# Patient Record
Sex: Female | Born: 1935 | Race: White | Hispanic: No | Smoking: Never smoker
Health system: Southern US, Community
[De-identification: ages and names within clinical notes are randomized; demographics above are authoritative.]

## PROBLEM LIST (undated history)

## (undated) DIAGNOSIS — C4492 Squamous cell carcinoma of skin, unspecified: Secondary | ICD-10-CM

## (undated) DIAGNOSIS — R7989 Other specified abnormal findings of blood chemistry: Secondary | ICD-10-CM

## (undated) DIAGNOSIS — E7800 Pure hypercholesterolemia, unspecified: Secondary | ICD-10-CM

## (undated) DIAGNOSIS — I10 Essential (primary) hypertension: Secondary | ICD-10-CM

## (undated) DIAGNOSIS — R42 Dizziness and giddiness: Secondary | ICD-10-CM

## (undated) DIAGNOSIS — K5790 Diverticulosis of intestine, part unspecified, without perforation or abscess without bleeding: Secondary | ICD-10-CM

## (undated) DIAGNOSIS — J189 Pneumonia, unspecified organism: Secondary | ICD-10-CM

## (undated) DIAGNOSIS — I491 Atrial premature depolarization: Secondary | ICD-10-CM

## (undated) DIAGNOSIS — M1990 Unspecified osteoarthritis, unspecified site: Secondary | ICD-10-CM

## (undated) DIAGNOSIS — C4491 Basal cell carcinoma of skin, unspecified: Secondary | ICD-10-CM

## (undated) HISTORY — DX: Pure hypercholesterolemia, unspecified: E78.00

## (undated) HISTORY — DX: Basal cell carcinoma of skin, unspecified: C44.91

## (undated) HISTORY — PX: OTHER SURGICAL HISTORY: SHX169

## (undated) HISTORY — PX: TONSILLECTOMY: SHX5217

## (undated) HISTORY — DX: Squamous cell carcinoma of skin, unspecified: C44.92

## (undated) HISTORY — DX: Other specified abnormal findings of blood chemistry: R79.89

## (undated) HISTORY — PX: EYE SURGERY: SHX253

## (undated) HISTORY — DX: Diverticulosis of intestine, part unspecified, without perforation or abscess without bleeding: K57.90

---

## 1982-03-30 HISTORY — PX: BREAST BIOPSY: SHX20

## 2001-09-21 ENCOUNTER — Emergency Department (HOSPITAL_COMMUNITY): Admission: EM | Admit: 2001-09-21 | Discharge: 2001-09-21 | Payer: Self-pay | Admitting: Emergency Medicine

## 2001-09-21 ENCOUNTER — Encounter: Payer: Self-pay | Admitting: Emergency Medicine

## 2002-03-30 DIAGNOSIS — R7989 Other specified abnormal findings of blood chemistry: Secondary | ICD-10-CM

## 2002-03-30 HISTORY — DX: Other specified abnormal findings of blood chemistry: R79.89

## 2002-08-15 ENCOUNTER — Other Ambulatory Visit: Admission: RE | Admit: 2002-08-15 | Discharge: 2002-08-15 | Payer: Self-pay | Admitting: Internal Medicine

## 2002-08-24 ENCOUNTER — Encounter: Payer: Self-pay | Admitting: Internal Medicine

## 2002-08-24 ENCOUNTER — Encounter: Admission: RE | Admit: 2002-08-24 | Discharge: 2002-08-24 | Payer: Self-pay | Admitting: Internal Medicine

## 2003-07-29 HISTORY — PX: TOTAL KNEE ARTHROPLASTY: SHX125

## 2003-07-31 ENCOUNTER — Inpatient Hospital Stay (HOSPITAL_COMMUNITY): Admission: RE | Admit: 2003-07-31 | Discharge: 2003-08-05 | Payer: Self-pay | Admitting: Specialist

## 2004-02-05 ENCOUNTER — Emergency Department (HOSPITAL_COMMUNITY): Admission: EM | Admit: 2004-02-05 | Discharge: 2004-02-05 | Payer: Self-pay | Admitting: Emergency Medicine

## 2004-02-22 ENCOUNTER — Encounter: Admission: RE | Admit: 2004-02-22 | Discharge: 2004-02-22 | Payer: Self-pay | Admitting: Family Medicine

## 2004-10-09 ENCOUNTER — Other Ambulatory Visit: Admission: RE | Admit: 2004-10-09 | Discharge: 2004-10-09 | Payer: Self-pay | Admitting: Family Medicine

## 2005-03-19 IMAGING — CR DG KNEE 1-2V*L*
2 series · 2 of 2 positions shown · non-contrast
Comparison: none

CLINICAL DATA: Osteoarthritis of left knee, preop evaluation for knee replacement.
 TWO VIEW CHEST 
 There is mild cardiomegaly and peribronchial thickening.  No focal air space opacities or effusions.  Degenerative changes are noted in the thoracic spine.
 IMPRESSION
 Cardiomegaly, peribronchial thickening.
 LEFT KNEE TWO VIEW
 There are marked degenerative changes in the left knee with joint space narrowing and osteophyte formation, most pronounced in the medial compartment. No joint effusion or acute bony abnormality.
 Advanced osteoarthritic changes left knee.

[view not recorded (1 of 2)]
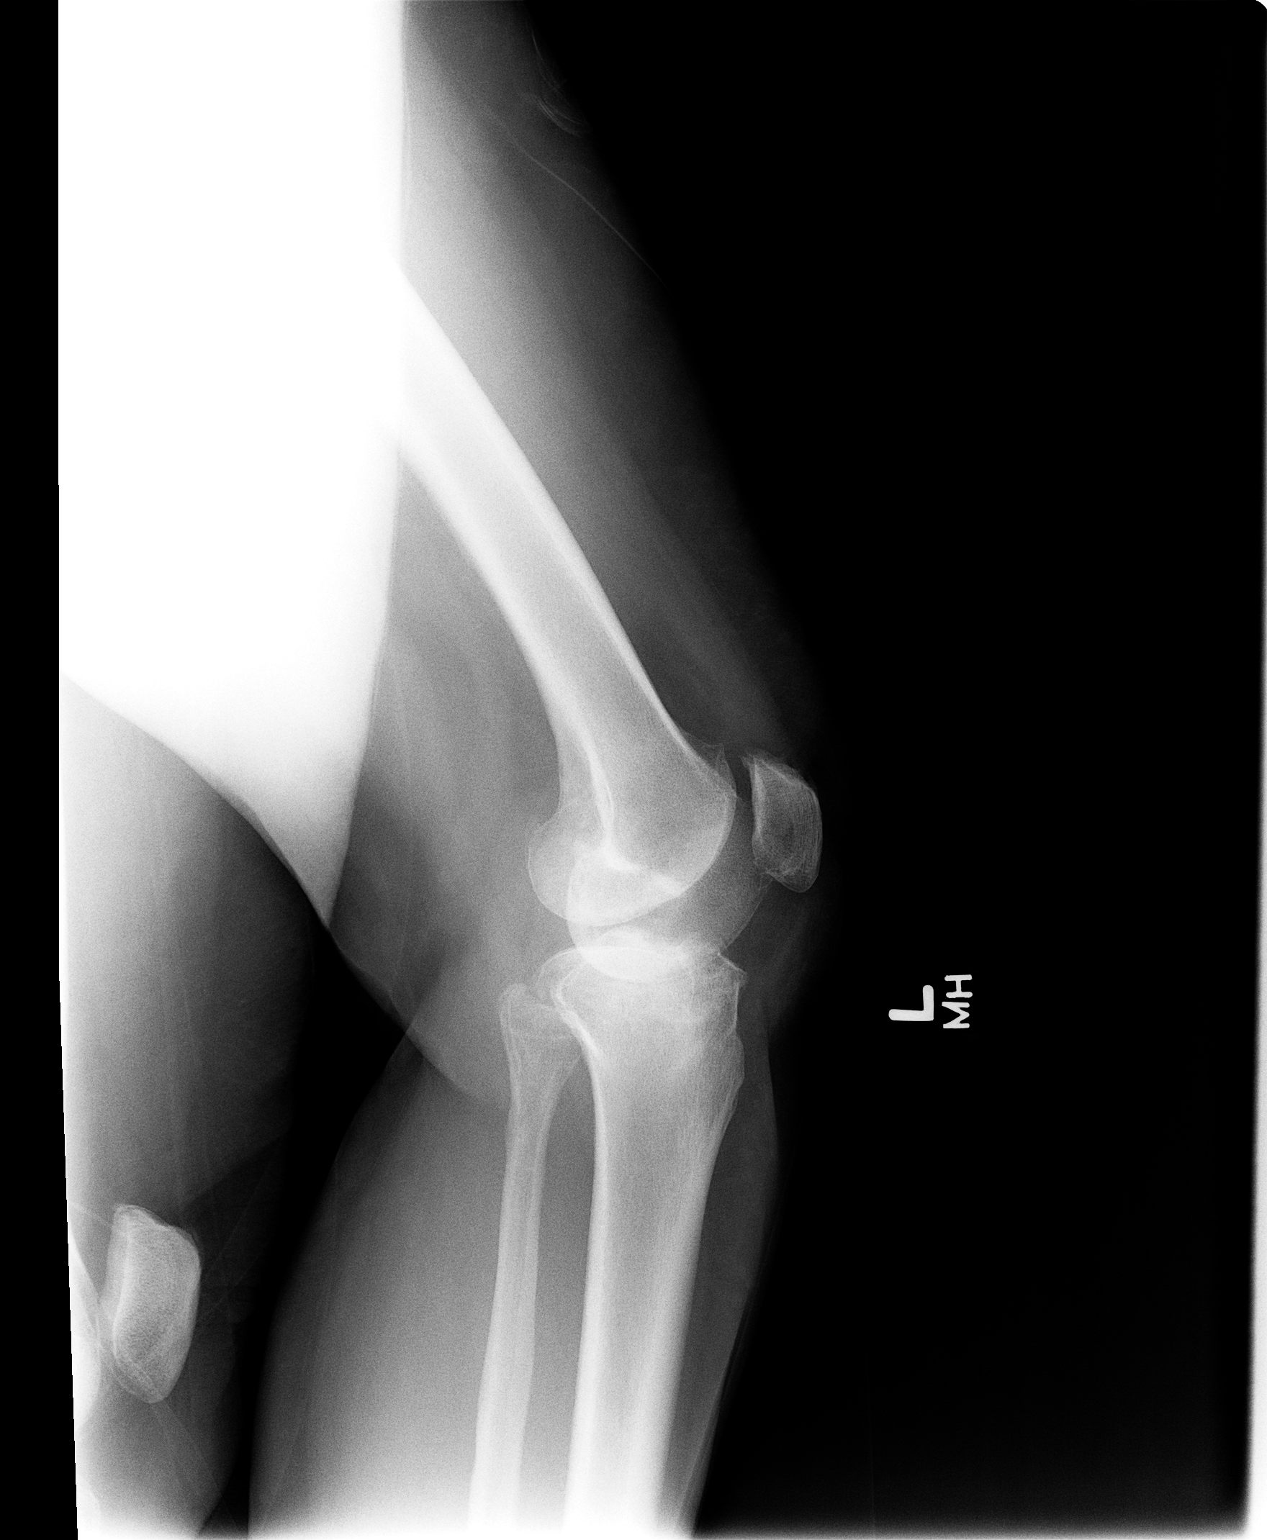

[view not recorded (2 of 2)]
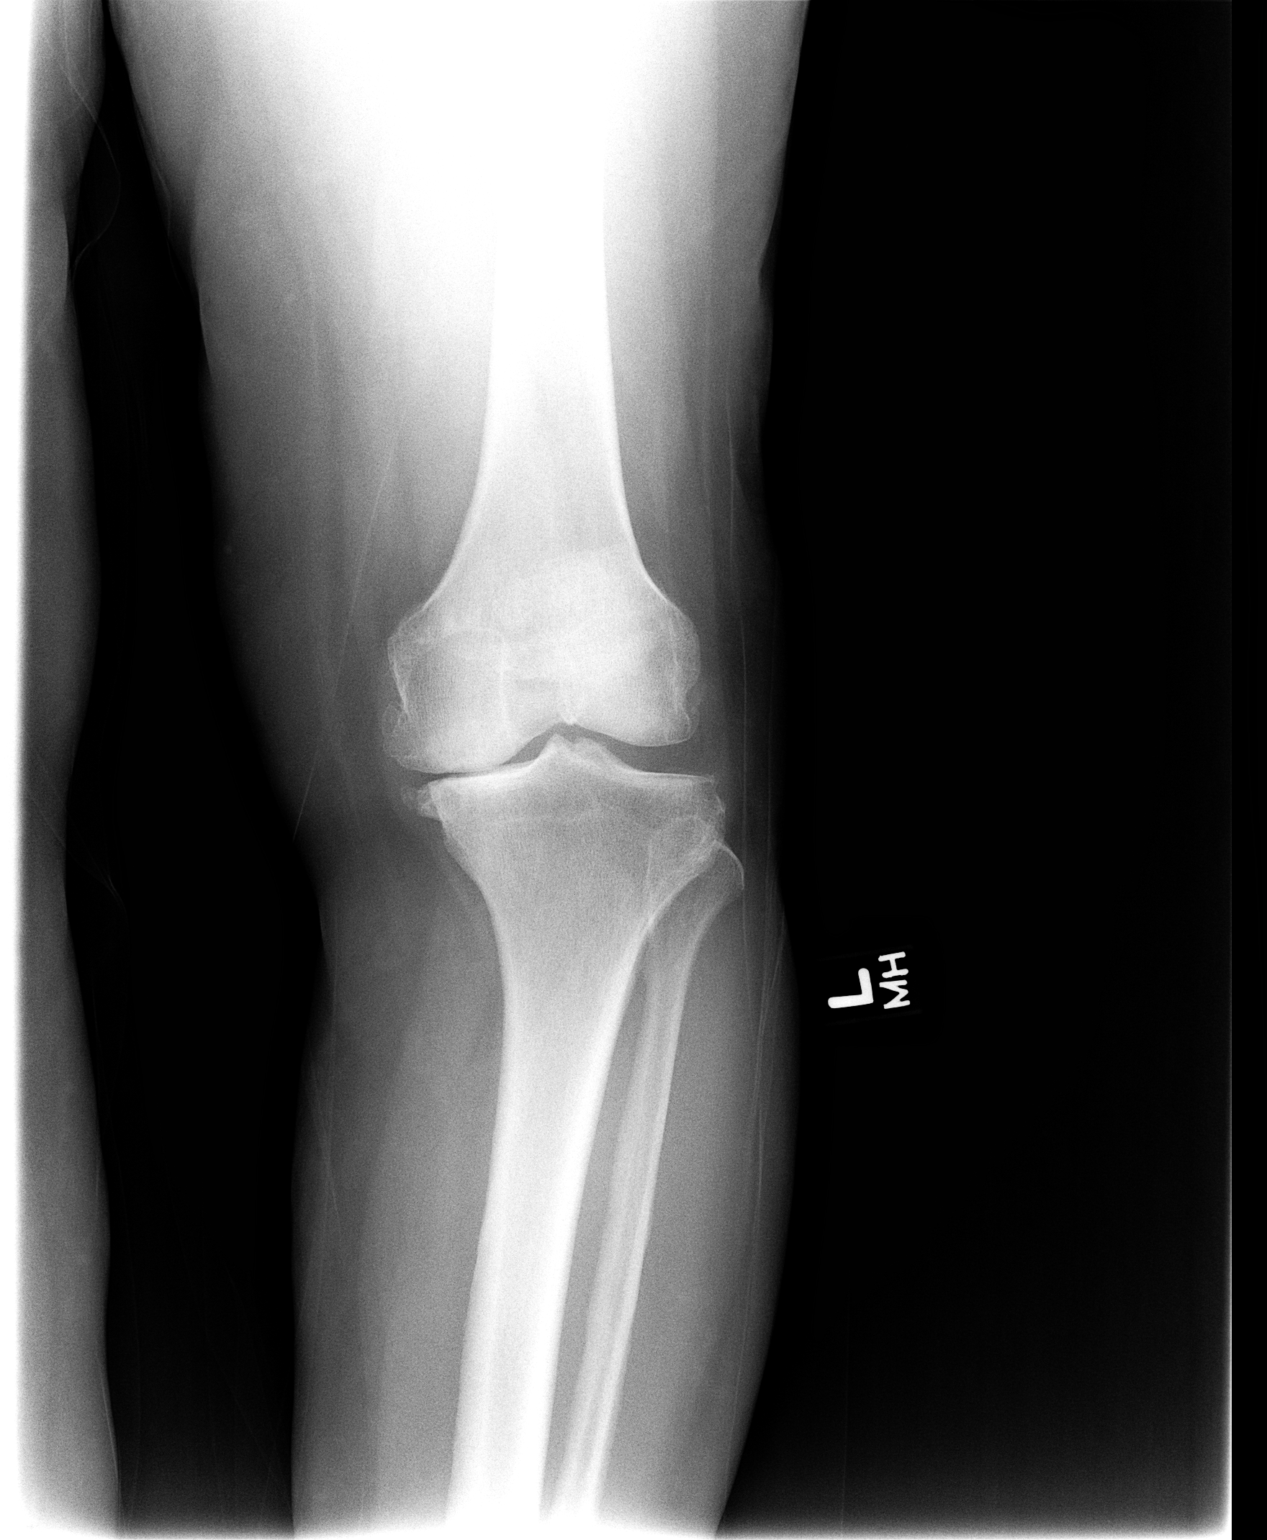

[2 of 2 positions shown; findings below may reference images not displayed]

## 2005-08-20 ENCOUNTER — Encounter: Admission: RE | Admit: 2005-08-20 | Discharge: 2005-08-20 | Payer: Self-pay | Admitting: Family Medicine

## 2005-10-28 DIAGNOSIS — K5790 Diverticulosis of intestine, part unspecified, without perforation or abscess without bleeding: Secondary | ICD-10-CM

## 2005-10-28 HISTORY — DX: Diverticulosis of intestine, part unspecified, without perforation or abscess without bleeding: K57.90

## 2005-11-02 LAB — HM COLONOSCOPY

## 2005-11-18 ENCOUNTER — Emergency Department (HOSPITAL_COMMUNITY): Admission: EM | Admit: 2005-11-18 | Discharge: 2005-11-18 | Payer: Self-pay | Admitting: Emergency Medicine

## 2006-11-24 ENCOUNTER — Encounter: Admission: RE | Admit: 2006-11-24 | Discharge: 2006-11-24 | Payer: Self-pay | Admitting: Family Medicine

## 2006-12-01 ENCOUNTER — Other Ambulatory Visit: Admission: RE | Admit: 2006-12-01 | Discharge: 2006-12-01 | Payer: Self-pay | Admitting: Family Medicine

## 2008-03-07 ENCOUNTER — Encounter: Admission: RE | Admit: 2008-03-07 | Discharge: 2008-03-07 | Payer: Self-pay | Admitting: Family Medicine

## 2009-09-03 ENCOUNTER — Encounter: Admission: RE | Admit: 2009-09-03 | Discharge: 2009-09-03 | Payer: Self-pay | Admitting: Family Medicine

## 2009-11-05 ENCOUNTER — Other Ambulatory Visit: Admission: RE | Admit: 2009-11-05 | Discharge: 2009-11-05 | Payer: Self-pay | Admitting: *Deleted

## 2009-11-12 LAB — HM PAP SMEAR: HM Pap smear: NORMAL

## 2010-08-15 NOTE — Op Note (Signed)
NAME:  Alexis Hines, Alexis Hines                       ACCOUNT NO.:  [id number]   MEDICAL RECORD NO.:  [id number]                   PATIENT TYPE:  INP   LOCATION:  X009                                 FACILITY:  [facility]   PHYSICIAN:  Erasmo Leventhal, M.D.         DATE OF BIRTH:  [birth year]   DATE OF PROCEDURE:  07/31/2003  DATE OF DISCHARGE:                                 OPERATIVE REPORT   PREOPERATIVE DIAGNOSES:  Left knee end-stage osteoarthritis.   POSTOPERATIVE DIAGNOSES:  Left knee end-stage osteoarthritis.   PROCEDURE:  Left total knee arthroplasty.   SURGEON:  Erasmo Leventhal, M.D.   ASSISTANT:  Jaquelyn Bitter. Chabon, P.A.   ANESTHESIA:  Spinal epidural.   ESTIMATED BLOOD LOSS:  Less than 100 mL.   DRAINS:  Hemovac.   TOURNIQUET TIME:  2 hours and 8 minutes at 350 mmHg.   COMPLICATIONS:  None.   DISPOSITION:  To PACU stable.   OPERATIVE IMPLANTS:  [employer] system was utilized.  Triathlon  total knee replacement. Posterior stabilized.  Size 3 femur, size 3 tibia, 9  mm posterior stabilized tibial insert with a 27 x 8 dome patella all  cemented.   DESCRIPTION OF PROCEDURE:  The patient's family was counseled.  The patient  comes to the holding area, correct side was identified.  She had a regular  cough since yesterday.  A preoperative chest x-ray was taken and it was  unchanged beforehand.  She had no fever, chills or sweats and she wished to  proceed.  Taken to the operating room where the spinal epidural was  administered.  Following this, the Foley catheter was placed utilizing  sterile technique by the OR circulating nurse. The left knee was examined,  she had a 7 degree flexion contracture, she could flex to 125 degrees. She  was elevated, prepped with Duraprep and all draped in a sterile fashion.  Exsanguinated with esmarch and tourniquet was inflated to 350 mmHg.   A straight midline incision was made through the skin and subcutaneous  tissue,  small veins electrocoagulated, medial and lateral soft tissue flaps  were developed at the appropriate level.  Medial parapatellar arthrotomy was  performed, medial soft tissue release done across the medial tibia. The  patella was everted, knee was flexed.  End-stage arthritic change, bone  against bone contact.  The cruciate ligaments were resected. A starting hole  made in the distal femur, canal was irrigated, effluent was clear.  An  intramedullary rod was gently placed.  We chose a 5 degree valgus cut and  took a 10 mm cut off the distal femur.  The distal femur was found to be a  size #3, rotational marks were made and the cutting blocks was applied.  At  this time, the size 3 jig was applied, set in the correct medial and lateral  position and utilizing the standard technique, I was instructed by the  representative. The femoral notch  was prepared initially with osteotomes  proximally and then an osteotome going anterior to posterior and then  __________ distal to proximal.  The __________was found to be well prepared  at this point in time.   Attention was directed to the tibia, medial and lateral menisci were  removed. The posteromedial and posterolateral geniculate were coagulated.  The posterior neurovascular structures were thought of and protected  throughout this entire case.  The tibial eminence was resected, starting  hole made in the tibia.  A step reamer was utilized, canal was irrigated,  effluent was clear.  The intramedullary rod was gently placed. We chose to  take 10 mm off a zero degree slope based upon the lateral side and this was  done.  Posteromedial and posterofemoral osteophytes removed meticulously.  At this time with the knee in flexion, the flexion gap block was applied and  we determined we needed to take another 2 mm off the tibia.  At this point  in time that was then done. At this point, we also noticed that there was a  crack on the medial femoral  condyle at the intercondylar notch going  laterally. The lateral periosteum was intact, it was an oblique fracture. At  this point in time, it was anatomically reduced; however, I was concerned  about this displacing.  Therefore utilizing the AO large frag set, a 4.5  screw was placed from distal to proximal retrograde across the fracture site  and the distal end cut at a good angle. We made sure that we stayed in the  femoral canal.  A 44 mm 4.5 cortical screw was then utilized.  It was  counter sunk and taken below the bone cut.  This gave a nice fixation of the  medial femoral condyle region. This was fractured more through the  cancellous bone obliquely towards the edge of the periphery.  At this time,  I felt it was stable enough at this point to proceed on.  We then put in a  femoral trial a bit over sized to make sure it would not be too tight and  then with a size 3 tibia with a 6 mm tibial insert, we had excellent range  of motion and soft tissue balance. Rotational marks were made and the delta  keel was performed in a standard fashion.   The patella was found to be 27 mm.  8 mm was removed, locking holes were  made.  At this time, utilizing pulsatile lavage, the knee was copiously  irrigated.  The cement was properly mixed on the back table.  Utilizing  modern cement technique, all components were cemented in place.  A size 3  tibia, size 3 femur with the 27 x 8 mm patella.  After the cement had cured,  I put bone wax on the exposed bony surfaces. The tibial trial was removed  with a 9 mm insert with excellent range of motion and soft tissue balance  and flexion extension.  I tried an 72 and it was simply too thick.  At this  time, the trial was removed, excess cement was removed, tibial base plate  was thoroughly dry and cleansed and the 9 mm tibial insert was applied and  given secure fixation circumferentially. The knee was then checked, she had excellent range of motion and  showed well balanced full extension. There  were no complications or problems from the medial femoral condyle and  patellofemoral tracking did not require lateral release.  Two medium Hemovac drains were placed.  Bone wax was placed on exposed bony  surfaces, the knee was then packed with sponges and tourniquet was deflated.  We held pressure on the knee for 8 minutes at this point in time.  Following  this, the Ace wrap was removed and the sponges and we had a nice dry  surgical field.   The knee joint was irrigated with antibiotic solution during the closure.  A  sequential closure of layers was done with arthrotomy Vicryl, subcu Vicryl.  Skin closed with subcuticular Monocryl suture.  Steri-Strips were applied,  sterile compressive dressing. She had normal circulation of the foot and  ankle at the end of the case.  She tolerated the procedure well with no  complications. She was then gently awakened and she was taken from the  operating room to PACU in stable condition.  Surgeon was Erasmo Leventhal, M.D., assistant Jaquelyn Bitter. Chabon, P.A.   To decrease surgical time and help throughout this entire surgical  procedure, Mr. Jaquelyn Bitter. Chabon's assistance was needed.   PLAN:  Stabilization in the PACU.  Postoperative x-ray will be obtained and  will be reviewed.                                               Erasmo Leventhal, M.D.    RAC/MEDQ  D:  07/31/2003  T:  07/31/2003  Job:  [id number]   cc:   Erasmo Leventhal, M.D.  [facility]  [address]  [address]  [place]  [place] [address]  Fax: [phone]

## 2010-08-15 NOTE — Discharge Summary (Signed)
NAME:  Alexis Hines, Alexis Hines                       ACCOUNT NO.:  [id number]   MEDICAL RECORD NO.:  [id number]                   PATIENT TYPE:  INP   LOCATION:  0484                                 FACILITY:  [facility]   PHYSICIAN:  Erasmo Leventhal, M.D.         DATE OF BIRTH:  September 24, 1933   DATE OF ADMISSION:  07/31/2003  DATE OF DISCHARGE:  08/05/2003                                 DISCHARGE SUMMARY   ADMITTING DIAGNOSIS:  Left knee osteoarthritis.   DISCHARGE DIAGNOSIS:  Left knee osteoarthritis.   OPERATION:  Left knee total knee arthroplasty.   HISTORY OF PRESENT ILLNESS:  This is a 77 year old lady with a history of  osteoarthritis of the left knee with near bone-on-bone medial compartment  arthritis and significant patellofemoral and lateral compartment arthritis.  She has failed conservative measures of injection and therapy, and after  discussion of treatment options the patient was scheduled for total knee  arthroplasty.  Surgery risks, benefits and aftercare were discussed in  detail with the patient, questions advised and answered.   LABORATORY VALUES:  Admission CBC within normal limits.  Her hemoglobin and  hematocrit reached a low of 11.0 and 31.8, and then rebounded on Aug 04, 2003, at 64 and 36.  Admission PT and PTT within normal limits.  She was at  18 and 1.8 on her PT and INR on discharge.  She was slightly hyponatremic  intermittently through admission and did have mildly elevated glucose at  150, 125, and 115 respectively.  She also had slight hypocalcemia.  Urine  culture showed insignificant growth and urinalysis showed few epithelial  cells.   COURSE IN THE HOSPITAL:  Patient tolerated the operative procedure well.  First postoperative day vital signs were stable, she was afebrile,  neurovascular status was intact to foot, calves were negative, dressing dry,  and drain removed without difficulty.  Her lungs had bilateral rhonchi at  the bases.  She was  encouraged in incentive spirometry and cough and deep  breathe, and IM consult was obtained for lung congestion and cough.  She was  started on Allegra for allergies and postnasal drip.  Second postoperative  day, patient was feeling better but some moderate pain, vital signs were  stable, temp was 99.6, labs were within acceptable limits with the exception  of slight hyponatremia, fluid restriction was initiated.  Dressing was  changed, wound was benign, calves were soft and nontender.  Lungs had light  rhonchi, which was better today.  Third postoperative day she was feeling  better, nausea and vomiting was resolved, vital signs were stable, temp to  101 max, she had had some slight tachycardia but it was 82 at the time of  our visit.  Lungs were clear, bowel sounds were active, heart sounds were  normal, dressing was changed, wound was benign, calves soft and nontender.  KUB was obtained due to her nausea and vomiting from earlier and showed a  nonspecific gas pattern with no obstruction or ileus.  She continued her  incentive spirometry, physical therapy, CPM machine, and plans were made to  discharge home Sunday if she was stable.  Fourth postoperative day vital  signs were stable, PT and INR was therapeutic, hemoglobin was 12, and she  was doing well.  And on the fifth postoperative day in improved condition  with clearance by the medical service, she was subsequently discharged home  for follow up in the office.   CONDITION ON DISCHARGE:  Improved.   DISCHARGE MEDICATIONS:  1. Percocet 5/325 one to two q.4-6h. p.r.n. pain.  2. Robaxin 500 one p.o. q.8h. p.r.n. spasm.  3. Coumadin per pharmacy protocol.  4. Augmentin 500/125 x7 days for low-grade fever.   DISCHARGE INSTRUCTIONS:  She is instructed to elevate and ice the knee, do  her CPM, do her home physical therapy, and return to the office in 10 days  for recheck or soon p.r.n. problems.     Jaquelyn Bitter. Chabon, P.A.                    Erasmo Leventhal, M.D.    SJC/MEDQ  D:  08/20/2003  T:  08/21/2003  Job:  [id number]

## 2010-08-15 NOTE — H&P (Signed)
NAME:  Alexis Hines, Alexis Hines                       ACCOUNT NO.:  [id number]   MEDICAL RECORD NO.:  [id number]                   PATIENT TYPE:  INP   LOCATION:  NA                                   [facility]ACILITY[facility]   PHYSICIAN:  Erasmo Leventhal, M.D.         DATE OF BIRTH:  Jul 28, 1933   DATE OF ADMISSION:  DATE OF DISCHARGE:                                HISTORY & PHYSICAL   DATE OF SURGERY:  Jul 31, 2003   CHIEF COMPLAINT:  Left knee osteoarthritis.   HISTORY OF PRESENT ILLNESS:  This is a 77 year old lady with a history of  osteoarthritis in her left knee.  She has near bone-on-bone medial  compartment arthritis and significant patellofemoral and lateral arthritis.  She has failed conservative measures of injection and therapy and after  discussion of treatment options the patient is now scheduled for total knee  arthroplasty of her left knee.  The surgery risks, benefits, and aftercare  including infection, stiffness, DVT with PE were all discussed in detail  with the patient, questions invited and answered, the surgery to go ahead as  scheduled.   PAST MEDICAL HISTORY:  1. Drug allergies:  None.  2. Current medication:  Vytorin one q.h.s.  3. Previous surgery:  Breast biopsy.  4. Serious medical illnesses:  Hypercholesterolemia.   SOCIAL HISTORY:  The patient is married, she lives at home, she is retired.  She does not smoke and drinks occasionally.   FAMILY HISTORY:  Positive for cancer and osteoporosis.   REVIEW OF SYSTEMS:  CENTRAL NERVOUS SYSTEM:  Positive for occasional  headache, negative for blurred vision or dizziness.  PULMONARY:  Negative  for shortness of breath, PND, or orthopnea.  CARDIOVASCULAR:  Negative for  chest pain or palpitations.  GI:  Negative for ulcers or hepatitis.  GU:  Negative for urinary tract difficulty.  MUSCULOSKELETAL:  Positive as in  HPI.   PHYSICAL EXAMINATION:  VITAL SIGNS:  Blood pressure 148/88, respirations 12,  pulse 78 and  regular.  GENERAL APPEARANCE:  This is a well-developed, well-nourished lady in no  acute distress.  HEENT:  Head normocephalic.  Nose patent, ears patent.  Pupils equal, round,  and reactive to light.  Throat without injection.  NECK:  Supple without adenopathy.  Carotids 2+ without bruit.  CHEST:  Clear to auscultation, no rales or rhonchi.  Respirations 12.  HEART:  Regular rate and rhythm at 78 beats per minute without murmur.  ABDOMEN:  Soft with active bowel sounds, no masses or organomegaly.  NEUROLOGIC:  The patient alert and oriented to time, place, and person.  Cranial nerves II-XII grossly intact.  EXTREMITIES:  The left knee shows a 5-degree flexion contracture with  further flexion to 130 degrees.  Neurovascular status is intact in the leg.  Dorsalis pedis and posterior tibialis pulses are 1+.  The patient is in a  varus deformity; again, has a 5-degree flexion contraction.   X-rays show  near bone-on-bone medial compartment arthritis and significant  patellofemoral and lateral compartment arthritis.   IMPRESSION:  End-stage osteoarthritis left knee.   PLAN:  Total knee arthroplasty left knee.     Jaquelyn Bitter. Chabon, P.A.                   Erasmo Leventhal, M.D.    SJC/MEDQ  D:  07/25/2003  T:  07/25/2003  Job:  [id number]

## 2010-11-26 ENCOUNTER — Ambulatory Visit (INDEPENDENT_AMBULATORY_CARE_PROVIDER_SITE_OTHER): Payer: Medicare Other | Admitting: Family Medicine

## 2010-11-26 ENCOUNTER — Encounter: Payer: Self-pay | Admitting: Family Medicine

## 2010-11-26 VITALS — BP 118/84 | HR 64 | Temp 98.2°F | Ht 59.0 in | Wt 145.0 lb

## 2010-11-26 DIAGNOSIS — E7800 Pure hypercholesterolemia, unspecified: Secondary | ICD-10-CM

## 2010-11-26 DIAGNOSIS — J069 Acute upper respiratory infection, unspecified: Secondary | ICD-10-CM

## 2010-11-26 LAB — LIPID PANEL
Cholesterol: 320 mg/dL — ABNORMAL HIGH (ref 0–200)
LDL Cholesterol: 224 mg/dL — ABNORMAL HIGH (ref 0–99)
Triglycerides: 176 mg/dL — ABNORMAL HIGH (ref <150)
VLDL: 35 mg/dL (ref 0–40)

## 2010-11-26 NOTE — Progress Notes (Signed)
Chief complaints: since Sunday, cough and cold. Patient states she is afraid it might be pneumonia. Also she is scheduled for CPE on 12/11/10 wants to now if she can schedule to have labs done prior to CPE  HPI: complains of 3-4 days with runny nose, head congestion, some eye pain, and cough.  This morning coughed up some clear phlegm. She recalls getting Pneumovax in 2004; 2008 had similar symptoms to what she has now, needed CXR, CT and PET scan to evaluate lung abnormality, and eventually was told she had pneumonia, treated with Levaquin.  Repeat CXR 06/2009 and was clear.  She presents today to evaluate her cold symptoms, and ensure she doesn't have pneumonia.  Hyperlipidemia: She reports that she stopped taking her Crestor 3 months ago.  She denied any side effects or problems, but was concerned after reading some things about her memory possibly being affected by statin medications.  Thinks maybe she is doing the crossword puzzles a little better, but otherwise no significant difference noted since stopping the medication.  Previously took 20mg  of Crestor.  Brings in labs from [facility] from 04/2010--chol 181, LDL 91, HDL 62, TG 133.  Also had normal CBC, C-met, u/a, TSH at that time.  Past Medical History  Diagnosis Date  . Pure hypercholesterolemia   . Basal cell cancer     nose; Dr. Terri Piedra     Past Surgical History  Procedure Date  . Total knee arthroplasty 07/2003    left    History   Social History  . Marital Status: Married    Spouse Name: N/A    Number of Children: 2  . Years of Education: N/A   Occupational History  . Not on file.   Social History Main Topics  . Smoking status: Never Smoker   . Smokeless tobacco: Never Used  . Alcohol Use: Yes     occasional glass of wine.  . Drug Use: No  . Sexually Active: Not on file   Other Topics Concern  . Not on file   Social History Narrative   Married. Lives 1/2 the year in [place]. Her husband (retired Librarian, academic) has liver cancer      Family History  Problem Relation Age of Onset  . Cancer Sister     bone cancer L leg with mets to lung, diagnosed age 63; s/p amputation  . Hyperlipidemia Brother     Current outpatient prescriptions:Calcium Carbonate-Vitamin D (CALTRATE 600+D) 600-400 MG-UNIT per tablet, Take 1 tablet by mouth daily.  , Disp: , Rfl: ;  Multiple Vitamins-Minerals (CENTRUM PO), Take 1 tablet by mouth daily.  , Disp: , Rfl:   No Known Allergies  PHYSICAL EXAM: BP 118/84  Pulse 64  Temp(Src) 98.2 F (36.8 C) (Oral)  Ht 4\' 11"  (1.499 m)  Wt 145 lb (65.772 kg)  BMI 29.29 kg/m2 Well developed, pleasant female, with intermittent dry cough during visit Nasal mucosa moderately edematous, clear mucus Sinuses nontender, OP clear Lungs clear bilaterally, no wheeze or cough with forced expiration Heart: regular rate and rhythm without murmurs Extremities no edema Skin: no rashes Neuro: alert and oriented x 3.  Cranial nerves grossly intact.  Normal gait Psych: normal mood, affect, hygiene and grooming.  ASSESSMENT/PLAN: 1. Acute upper respiratory infections of unspecified site    2. Pure hypercholesterolemia  Lipid panel   URI--add guaifenesin to her current OTC regimen (OTC agents explained in detail) Hyperlipidemia--previously well controlled with Crestor, but pt stopped med 3 months ago.  She is fasting  today and would like lipids re-checked. She states she has ben eating better.  If elevated (as I suspect) recommend restarting the Crestor since she really tolerated it quite well  Check old records to see if Vitamin D ever checked (no records received yet--pt has upcoming CPE)

## 2010-11-26 NOTE — Patient Instructions (Addendum)
Continue with decongestant and cough suppressant.  I think you would benefit from an expectorant to loosen the phlegm (guaifenesin).  You can either add plain Mucinex to your current cold medication (that has phenylephrine and dextromethorphan), OR, you can change to Mucinex-D (this is sold behind the counter at the pharmacy--doesn't need a prescription but requires a signature) and take Delsym syrup (the cough suppressant) only if you need to for cough.  The delsym can be taken along with mucinex D.  No other combinations are acceptable with your current OTC medication or you will be getting duplicate of the ingredients which isn't safe.  If your cholesterol is high on today's labs, then we will be calling you to advise you to restart your Crestor, since you otherwise seem to tolerate it well, and didn't notice any significant improvements (just slight) after stopping the Crestor.

## 2010-11-27 ENCOUNTER — Telehealth: Payer: Self-pay | Admitting: *Deleted

## 2010-11-27 NOTE — Telephone Encounter (Signed)
Called patient to give her lab results, informed her that her cholesterol was 320, she will resume her Crestor and discuss at CPE.

## 2010-12-11 ENCOUNTER — Telehealth: Payer: Self-pay | Admitting: *Deleted

## 2010-12-11 ENCOUNTER — Ambulatory Visit (INDEPENDENT_AMBULATORY_CARE_PROVIDER_SITE_OTHER): Payer: Medicare Other | Admitting: Family Medicine

## 2010-12-11 ENCOUNTER — Encounter: Payer: Self-pay | Admitting: Family Medicine

## 2010-12-11 VITALS — BP 118/80 | HR 72 | Ht 60.0 in | Wt 146.0 lb

## 2010-12-11 DIAGNOSIS — E7800 Pure hypercholesterolemia, unspecified: Secondary | ICD-10-CM

## 2010-12-11 DIAGNOSIS — Z0000 Encounter for general adult medical examination without abnormal findings: Secondary | ICD-10-CM

## 2010-12-11 LAB — POCT URINALYSIS DIPSTICK
Bilirubin, UA: NEGATIVE
Blood, UA: NEGATIVE
Protein, UA: NEGATIVE
Urobilinogen, UA: NEGATIVE

## 2010-12-11 MED ORDER — ROSUVASTATIN CALCIUM 20 MG PO TABS: 20.0000 mg | ORAL_TABLET | Freq: Every day | ORAL | Status: DC

## 2010-12-11 NOTE — Patient Instructions (Addendum)
HEALTH MAINTENANCE RECOMMENDATIONS:  It is recommended that you get at least 30 minutes of aerobic exercise at least 5 days/week (for weight loss, you may need as much as 60-90 minutes). This can be any activity that gets your heart rate up. This can be divided in 10-15 minute intervals if needed, but try and build up your endurance at least once a week.  Weight bearing exercise is also recommended twice weekly.  Eat a healthy diet with lots of vegetables, fruits and fiber.  "Colorful" foods have a lot of vitamins (ie green vegetables, tomatoes, red peppers, etc).  Limit sweet tea, regular sodas and alcoholic beverages, all of which has a lot of calories and sugar.  Up to 1 alcoholic drink daily may be beneficial for women (unless trying to lose weight, watch sugars).  Drink a lot of water.  Calcium recommendations are 1200-1500 mg daily (1500 mg for postmenopausal women or women without ovaries), and vitamin D 1000 IU daily.  This should be obtained from diet and/or supplements (vitamins), and calcium should not be taken all at once, but in divided doses.  Monthly self breast exams and yearly mammograms for women over the age of 68 is recommended.  Sunscreen of at least SPF 30 should be used on all sun-exposed parts of the skin when outside between the hours of 10 am and 4 pm (not just when at beach or pool, but even with exercise, golf, tennis, and yard work!)  Use a sunscreen that says "broad spectrum" so it covers both UVA and UVB rays, and make sure to reapply every 1-2 hours.  Remember to change the batteries in your smoke detectors when changing your clock times in the spring and fall.  Use your seat belt every time you are in a car, and please drive safely and not be distracted with cell phones and texting while driving.   PLEASE SCHEDULE YOUR MAMMOGRAM

## 2010-12-11 NOTE — Telephone Encounter (Signed)
Called patient to let her know that I was putting her hemoccult cards in the mail.

## 2010-12-11 NOTE — Progress Notes (Signed)
Alexis Hines is a 77 y.o. female who presents for a complete physical.  She has the following concerns:  Feeling much better overall from the recent cold.  Just slight residual congestion in throat/chest.  She restarted the Crestor after getting call with her lab results.  Denies any side effects   Immunization History  Administered Date(s) Administered  . Influenza Whole 01/09/2010  . Pneumococcal Polysaccharide 10/29/2002  . Tdap 03/15/2008  . Zoster 03/28/2009  gets flu shots annually--plans to get it next month, declines today Last Pap smear:  10/2009 Last mammogram: spring 2011 Last colonoscopy: 2007 Last DEXA: 09/2008--normal Dentist: 3 times/year Ophtho: yearly, scheduled for next month Exercise: 3x/week Silver Sneakers Had CBC, c-met and TSH (and normal lipids, when was still on her Crestor) in 04/2010 in [place]  Past Medical History  Diagnosis Date  . Pure hypercholesterolemia   . Basal cell cancer     nose; Dr. Terri Piedra   . Abnormal TSH 2004    normal since  . Diverticulosis 8/07  . Migraine childhood    resolved    Past Surgical History  Procedure Date  . Total knee arthroplasty 07/2003    left  . Breast biopsy 1984    benign  . Tonsillectomy age 41    History   Social History  . Marital Status: Married    Spouse Name: N/A    Number of Children: 2  . Years of Education: N/A   Occupational History  . retired Retail buyer    Social History Main Topics  . Smoking status: Never Smoker   . Smokeless tobacco: Never Used  . Alcohol Use: Yes     occasional glass of wine.  . Drug Use: No  . Sexually Active: Not on file   Other Topics Concern  . Not on file   Social History Narrative   Married. Lives 1/2 the year in [place]. Her husband (retired Librarian, academic) has liver cancer    Family History  Problem Relation Age of Onset  . Cancer Sister     bone cancer L leg with mets to lung, diagnosed age 33; s/p amputation  . Hyperlipidemia Brother      Current outpatient prescriptions:Calcium Carbonate-Vitamin D (CALTRATE 600+D) 600-400 MG-UNIT per tablet, Take 1 tablet by mouth daily.  , Disp: , Rfl: ;  Multiple Vitamins-Minerals (CENTRUM PO), Take 1 tablet by mouth daily.  , Disp: , Rfl: ;  rosuvastatin (CRESTOR) 20 MG tablet, Take 1 tablet (20 mg total) by mouth daily., Disp: 28 tablet, Rfl: 0  No Known Allergies  ROS: The patient denies anorexia, fever, weight changes, headaches,  vision changes,  ear pain, sore throat, breast concerns, chest pain, palpitations, dizziness, syncope, dyspnea on exertion, cough, swelling, nausea, vomiting, diarrhea, constipation, abdominal pain, melena, hematochezia, indigestion/heartburn, hematuria, incontinence, dysuria, vaginal bleeding, discharge, odor or itch, genital lesions, joint pains, numbness, tingling, weakness, tremor, suspicious skin lesions, depression, anxiety, abnormal bleeding/bruising, or enlarged lymph nodes. +decreased hearing R ear  PHYSICAL EXAM: BP 118/80  Pulse 72  Ht 5' (1.524 m)  Wt 146 lb (66.225 kg)  BMI 28.51 kg/m2  General Appearance:    Alert, cooperative, no distress, appears stated age  Head:    Normocephalic, without obvious abnormality, atraumatic  Eyes:    PERRL, conjunctiva/corneas clear, EOM's intact, fundi    benign  Ears:    Normal TM's and external ear canals  Nose:   Nares normal, mucosa mildly edematous with clear mucus and slight crusting. No sinus  tenderness  Throat:   Lips, mucosa, and tongue normal; teeth and gums normal  Neck:   Supple, no lymphadenopathy;  thyroid:  no   enlargement/tenderness/nodules; no carotid   bruit or JVD  Back:    Spine nontender, no curvature, ROM normal, no CVA     tenderness  Lungs:     Clear to auscultation bilaterally without wheezes, rales or     ronchi; respirations unlabored  Chest Wall:    No tenderness or deformity   Heart:    Regular rate and rhythm, S1 and S2 normal, no murmur, rub   or gallop  Breast Exam:     No tenderness, masses, or nipple discharge or inversion.      No axillary lymphadenopathy  Abdomen:     Soft, non-tender, nondistended, normoactive bowel sounds,    no masses, no hepatosplenomegaly  Genitalia:    Normal external genitalia without lesions.  BUS and vagina normal; normal bimanual exam--Uterus and adnexa not enlarged, nontender, no masses.  Pap not performed  Rectal:    Normal tone, no masses or tenderness; guaiac negative stool  Extremities:   No clubbing, cyanosis or edema  Pulses:   2+ and symmetric all extremities  Skin:   Skin color, texture, turgor normal, no rashes or lesions  Lymph nodes:   Cervical, supraclavicular, and axillary nodes normal  Neurologic:   CNII-XII intact, normal strength, sensation and gait; reflexes 2+ and symmetric throughout          Psych:   Normal mood, affect, hygiene and grooming.    ASSESSMENT/PLAN: 1. Routine general medical examination at a health care facility  POCT Urinalysis Dipstick, Visual acuity screening  2. Pure hypercholesterolemia  Hepatic function panel, Lipid panel, rosuvastatin (CRESTOR) 20 MG tablet   Discussed monthly self breast exams and yearly mammograms after the age of 19; at least 30 minutes of aerobic activity at least 5 days/week; proper sunscreen use reviewed; healthy diet, including goals of calcium and vitamin D intake and alcohol recommendations (less than or equal to 1 drink/day) reviewed; regular seatbelt use; changing batteries in smoke detectors.  Immunization recommendations discussed--yearly flu shot.  Colonoscopy recommendations reviewed--UTD.  Hemoccult cards (forgot to give at visit, mailing to patient)  F/u in 2 months for fasting labs--to determine if lipids adequately controlled on 20mg  of Crestor.  To be done prior to going to [facility].

## 2010-12-23 ENCOUNTER — Encounter: Payer: Self-pay | Admitting: Family Medicine

## 2010-12-23 ENCOUNTER — Other Ambulatory Visit: Payer: Medicare Other

## 2010-12-23 DIAGNOSIS — R195 Other fecal abnormalities: Secondary | ICD-10-CM | POA: Insufficient documentation

## 2010-12-23 DIAGNOSIS — Z0000 Encounter for general adult medical examination without abnormal findings: Secondary | ICD-10-CM

## 2010-12-23 LAB — POC HEMOCCULT BLD/STL (HOME/3-CARD/SCREEN): Fecal Occult Blood, POC: POSITIVE

## 2010-12-24 ENCOUNTER — Other Ambulatory Visit: Payer: Medicare Other

## 2010-12-25 ENCOUNTER — Telehealth: Payer: Self-pay | Admitting: *Deleted

## 2010-12-25 ENCOUNTER — Other Ambulatory Visit: Payer: Self-pay | Admitting: *Deleted

## 2010-12-25 DIAGNOSIS — K921 Melena: Secondary | ICD-10-CM

## 2010-12-25 NOTE — Telephone Encounter (Signed)
Spoke with patient [id number] stool cards. Patient stated that she did not think she currently had a hemorrhoid but she was constipated several times over the past month. She will call her GI physician and get an appointment. Patient is scheduled for 12/30/10@ 9:00am for cbc check per Dr.Knapp.

## 2010-12-25 NOTE — Telephone Encounter (Signed)
Left message for patient to return my call.

## 2010-12-30 ENCOUNTER — Other Ambulatory Visit: Payer: Medicare Other

## 2010-12-30 DIAGNOSIS — K921 Melena: Secondary | ICD-10-CM

## 2010-12-30 LAB — CBC WITH DIFFERENTIAL/PLATELET
Eosinophils Absolute: 0.2 10*3/uL (ref 0.0–0.7)
Eosinophils Relative: 3 % (ref 0–5)
HCT: 45 % (ref 36.0–46.0)
Lymphocytes Relative: 35 % (ref 12–46)
Lymphs Abs: 2.1 10*3/uL (ref 0.7–4.0)
MCH: 29.9 pg (ref 26.0–34.0)
MCV: 92.8 fL (ref 78.0–100.0)
Neutro Abs: 3.2 10*3/uL (ref 1.7–7.7)
RBC: 4.85 MIL/uL (ref 3.87–5.11)
RDW: 13.3 % (ref 11.5–15.5)
WBC: 6.1 10*3/uL (ref 4.0–10.5)

## 2011-01-12 ENCOUNTER — Other Ambulatory Visit: Payer: Self-pay | Admitting: Family Medicine

## 2011-01-12 DIAGNOSIS — Z1231 Encounter for screening mammogram for malignant neoplasm of breast: Secondary | ICD-10-CM

## 2011-02-04 LAB — HM COLONOSCOPY

## 2011-03-03 ENCOUNTER — Ambulatory Visit
Admission: RE | Admit: 2011-03-03 | Discharge: 2011-03-03 | Disposition: A | Discharge status: Home / Self Care | Payer: Medicare Other | Source: Ambulatory Visit | Attending: Family Medicine | Admitting: Family Medicine

## 2011-03-03 DIAGNOSIS — Z1231 Encounter for screening mammogram for malignant neoplasm of breast: Secondary | ICD-10-CM

## 2011-03-04 ENCOUNTER — Encounter: Payer: Self-pay | Admitting: Internal Medicine

## 2011-03-10 ENCOUNTER — Encounter: Payer: Self-pay | Admitting: Family Medicine

## 2011-03-10 ENCOUNTER — Telehealth: Payer: Self-pay | Admitting: Family Medicine

## 2011-03-10 ENCOUNTER — Other Ambulatory Visit: Payer: Medicare Other

## 2011-03-10 DIAGNOSIS — E7800 Pure hypercholesterolemia, unspecified: Secondary | ICD-10-CM

## 2011-03-10 LAB — LIPID PANEL
LDL Cholesterol: 83 mg/dL (ref 0–99)
Triglycerides: 142 mg/dL (ref <150)

## 2011-03-10 LAB — HEPATIC FUNCTION PANEL
ALT: 19 U/L (ref 0–35)
Albumin: 4.2 g/dL (ref 3.5–5.2)
Bilirubin, Direct: 0.2 mg/dL (ref 0.0–0.3)
Total Protein: 6.2 g/dL (ref 6.0–8.3)

## 2011-03-10 MED ORDER — ROSUVASTATIN CALCIUM 20 MG PO TABS: 20.0000 mg | ORAL_TABLET | Freq: Every day | ORAL | Status: DC

## 2011-03-10 NOTE — Telephone Encounter (Signed)
Pt came in for labs, advised Dr. Lynelle Doctor had told her she could have samples.  Reviewed chart, pt on Crestor 20 mg   1 qd.  Gave #28 samples.

## 2011-03-16 ENCOUNTER — Telehealth: Payer: Self-pay | Admitting: Family Medicine

## 2011-03-16 NOTE — Telephone Encounter (Signed)
NO.  Advise patient that lipids are at goal because the Crestor is working.  If she decreases the dose, then lipids will rise.  If she isn't having any side effects or problems, LEAVE IT at 20mg --it is working perfectly

## 2011-03-16 NOTE — Telephone Encounter (Signed)
Spoke with patient and she will leave Crestor at 20mg .

## 2011-03-16 NOTE — Telephone Encounter (Signed)
Pt said she is doing so well on Crestor 20 mg can she decrease to 10 mg?

## 2011-08-26 ENCOUNTER — Encounter: Payer: Self-pay | Admitting: Family Medicine

## 2011-08-26 ENCOUNTER — Ambulatory Visit (INDEPENDENT_AMBULATORY_CARE_PROVIDER_SITE_OTHER): Payer: Medicare Other | Admitting: Family Medicine

## 2011-08-26 VITALS — BP 118/78 | HR 72 | Temp 98.2°F | Ht 60.0 in | Wt 146.0 lb

## 2011-08-26 DIAGNOSIS — J309 Allergic rhinitis, unspecified: Secondary | ICD-10-CM

## 2011-08-26 NOTE — Progress Notes (Signed)
Chief Complaint  Patient presents with  . Sore Throat    started 08/14/11 but had gotten better. Then she started with runny nose. Just started coughing yesterday, feels just awful.   HPI: 5/17 started with sore throat and sniffles.  Did salt water gargles and used a lozenge and throat got better, but sniffles continued.  Took Mucinex-D twice daily for two days, and it helped some, but sniffles continued.  Yesterday she started with cough, felt weak.  She used Delsym yesterday, with very little help. Denies fevers.  Cough productive of small amount of clear phlegm.  Blowing clear mucus from her nose.  Slight pressure at her cheeks, at sinuses.  Ears are itchy.  Denies itchy/watery eyes.    Past Medical History  Diagnosis Date  . Pure hypercholesterolemia   . Basal cell cancer     nose; Dr. Terri Piedra   . Abnormal TSH 2004    normal since  . Diverticulosis 8/07  . Migraine childhood    resolved   Past Surgical History  Procedure Date  . Total knee arthroplasty 07/2003    left  . Breast biopsy 1984    benign  . Tonsillectomy age 20   History   Social History  . Marital Status: Married    Spouse Name: N/A    Number of Children: 2  . Years of Education: N/A   Occupational History  . retired Retail buyer    Social History Main Topics  . Smoking status: Never Smoker   . Smokeless tobacco: Never Used  . Alcohol Use: Yes     occasional glass of wine.  . Drug Use: No  . Sexually Active: Not on file   Other Topics Concern  . Not on file   Social History Narrative   Married. Lives 1/2 the year in [place]. Her husband (retired Librarian, academic) has liver cancer   Current Outpatient Prescriptions on File Prior to Visit  Medication Sig Dispense Refill  . Calcium Carbonate-Vitamin D (CALTRATE 600+D) 600-400 MG-UNIT per tablet Take 1 tablet by mouth daily.        . Multiple Vitamins-Minerals (CENTRUM PO) Take 1 tablet by mouth daily.        . rosuvastatin (CRESTOR) 20 MG tablet Take 1  tablet (20 mg total) by mouth daily.  28 tablet  0   No Known Allergies  ROS: Denies fevers, nausea, vomiting.  Had slight diarrhea.  No dysuria.  Denies shortness of breath.  Slight chest discomfort after coughing, no chest pain otherwise.   PHYSICAL EXAM: BP 118/78  Pulse 72  Temp(Src) 98.2 F (36.8 C) (Oral)  Ht 5' (1.524 m)  Wt 146 lb (66.225 kg)  BMI 28.51 kg/m2 Well developed, pleasant elderly female with occasional dry cough, in no distress HEENT:  PERRL, EOMI, conjunctiva clear.  Tm's and EAC's normal.  OP clear without erythema Nasal mucosa moderately edematous, pale, no purulence. Sinuses nontender Neck: no lymphadenopathy or mass Heart; regular rate and rhythm, rare ectopic beat, no murmur Lungs: clear bilaterally with good air movement.  No cough with force expiration.  ASSESSMENT/PLAN: 1. Allergic rhinitis, cause unspecified    Allergies.  No evidence of bacterial infection, and history isn't consistent with virus.  Start antihistamine (Claritin, Allegra or Zyrtec) once daily. Restart Mucinex D twice daily Continue Delsym as needed for cough. Try sinus rinses or neti-pot if needed for ongoing sinus pressure.  Call for antibiotics if mucus becomes discolored, worsening sinus pain, despite these measures.

## 2011-08-26 NOTE — Patient Instructions (Signed)
Start antihistamine (Claritin, Allegra or Zyrtec) once daily. Restart Mucinex D twice daily Continue Delsym as needed for cough. Try sinus rinses or neti-pot if needed for ongoing sinus pressure.  Call for antibiotics if mucus becomes discolored, worsening sinus pain, despite these measures.  Drink plenty of fluids

## 2011-09-07 ENCOUNTER — Ambulatory Visit (INDEPENDENT_AMBULATORY_CARE_PROVIDER_SITE_OTHER): Payer: Medicare Other | Admitting: Family Medicine

## 2011-09-07 ENCOUNTER — Encounter: Payer: Self-pay | Admitting: Family Medicine

## 2011-09-07 VITALS — BP 112/68 | HR 64 | Temp 98.3°F | Ht 60.0 in | Wt 148.0 lb

## 2011-09-07 DIAGNOSIS — J309 Allergic rhinitis, unspecified: Secondary | ICD-10-CM

## 2011-09-07 MED ORDER — AMOXICILLIN 875 MG PO TABS: 875.0000 mg | ORAL_TABLET | Freq: Two times a day (BID) | ORAL | Status: AC

## 2011-09-07 MED ORDER — FLUTICASONE PROPIONATE 50 MCG/ACT NA SUSP: 2.0000 | Freq: Every day | NASAL | Status: DC

## 2011-09-07 NOTE — Progress Notes (Signed)
Chief Complaint  Patient presents with  . Nasal Congestion    seen 08/26/11 still having congestion, just not going away. She is flying next week and does not want to be sick. Has only an occasional cough.States that her neck hurts.   HPI:  She has been sick since 5/17.  She was seen in the office 5/29, and was recommended to start antihistamine, Mucinex D and use Delsym as needed for cough. She has been using Allegra daily since the last visit. She used Mucinex-D and Delsym for 5 days with good results--she felt better, but didn't know if she still needed it, so continued just with Allegra.  Has recurrent postnasal drip, head congestion and frontal headaches (which are relieved by Advil).  Nasal mucous is mainly white, sometimes a little yellowish.  Occasional sneezing.  Very little cough--last used Delsym 6/5.    She leaves for [place] next Sunday.  She has been walking a mile 4 times/week in anticipation of doing a lot of walking on hoer trip--walking briskly, and has no increased cough, no shortness of breath or problems  Past Medical History  Diagnosis Date  . Pure hypercholesterolemia   . Basal cell cancer     nose; Dr. Terri Piedra   . Abnormal TSH 2004    normal since  . Diverticulosis 8/07  . Migraine childhood    resolved   Current Outpatient Prescriptions on File Prior to Visit  Medication Sig Dispense Refill  . Calcium Carbonate-Vitamin D (CALTRATE 600+D) 600-400 MG-UNIT per tablet Take 1 tablet by mouth daily.        . fexofenadine (ALLEGRA) 180 MG tablet Take 180 mg by mouth daily.      . Multiple Vitamins-Minerals (CENTRUM PO) Take 1 tablet by mouth daily.        . rosuvastatin (CRESTOR) 20 MG tablet Take 1 tablet (20 mg total) by mouth daily.  28 tablet  0  . dextromethorphan (DELSYM) 30 MG/5ML liquid Take 60 mg by mouth as needed.      . fluticasone (FLONASE) 50 MCG/ACT nasal spray Place 2 sprays into the nose daily.  16 g  6   No Known Allergies  ROS: Denies fevers, nausea,  vomiting, diarrhea, skin rash, joint pains, other than some pain in the back of her neck.  No chest pain, shortness of breath, leg swelling.  PHYSICAL EXAM: BP 112/68  Pulse 64  Temp(Src) 98.3 F (36.8 C) (Oral)  Ht 5' (1.524 m)  Wt 148 lb (67.132 kg)  BMI 28.90 kg/m2 Well developed, pleasant female in no distress. No sniffle or cough. HEENT:  PERRL, conjunctiva clear.   Nasal mucosa moderately edematous Sinuses nontender OP clear Neck: no lymphadenopathy Heart: regular rate and rhythm Lungs: clear  Skin: no rash  ASSESSMENT/PLAN:  1. Allergic rhinitis, cause unspecified  fluticasone (FLONASE) 50 MCG/ACT nasal spray    Continue antihistamine--can try Claritin or Zyrtec to see if it is more effective than Allegra. Add Flonase--2 sprays each nostril daily--make sure to only gently sniff the medication. Use Mucinex-D as needed.  I recommend using this on the day of the plane flight, and as needed for sinus pain, thick mucus and/or cough.  I have prescribed an antibiotic--start it only if your mucus becomes/remains discolored (yellow green), worsening sinus pain, fevers, etc.  Make sure to take the entire course of antibiotics if you need to start it.

## 2011-09-07 NOTE — Patient Instructions (Signed)
Continue antihistamine--can try Claritin or Zyrtec to see if it is more effective than Allegra. Add Flonase--2 sprays each nostril daily--make sure to only gently sniff the medication. Use Mucinex-D as needed.  I recommend using this on the day of the plane flight, and as needed for sinus pain, thick mucus and/or cough.  I have prescribed an antibiotic--start it only if your mucus becomes/remains discolored (yellow green), worsening sinus pain, fevers, etc.  Make sure to take the entire course of antibiotics if you need to start it.

## 2011-10-29 ENCOUNTER — Telehealth: Payer: Self-pay | Admitting: Family Medicine

## 2011-10-29 NOTE — Telephone Encounter (Signed)
She is not a candidate for "pap only" exam.  Her CPE is due in September.  If her insurance covers CPE, it should be scheduled that way.  If it doesn't, then she needs a med check PLUS.  She needs breast/pelvic exam, as well as being due for med check

## 2011-10-29 NOTE — Telephone Encounter (Addendum)
CALLED PT. ADVISED PT OF DR KNAPP'S COMMENTS. ALSO NOTICED THAT PT ALREADY HAS 8/29 APPT SCHED FOR CHOLESTEROL FOLLOW UP. PATIENT STATED SHE WILL KEEP 8/29 APPT AND WAIT ON SCHEDULING CPE AND PAP APPT RIGHT NOW.

## 2011-11-17 ENCOUNTER — Encounter: Payer: Self-pay | Admitting: Internal Medicine

## 2011-11-26 ENCOUNTER — Encounter: Payer: Self-pay | Admitting: Family Medicine

## 2011-11-26 ENCOUNTER — Ambulatory Visit (INDEPENDENT_AMBULATORY_CARE_PROVIDER_SITE_OTHER): Payer: Medicare Other | Admitting: Family Medicine

## 2011-11-26 VITALS — BP 122/76 | HR 64 | Ht 58.25 in | Wt 148.0 lb

## 2011-11-26 DIAGNOSIS — L304 Erythema intertrigo: Secondary | ICD-10-CM

## 2011-11-26 DIAGNOSIS — E7800 Pure hypercholesterolemia, unspecified: Secondary | ICD-10-CM

## 2011-11-26 DIAGNOSIS — L538 Other specified erythematous conditions: Secondary | ICD-10-CM

## 2011-11-26 MED ORDER — ROSUVASTATIN CALCIUM 20 MG PO TABS: 20.0000 mg | ORAL_TABLET | Freq: Every day | ORAL | Status: DC

## 2011-11-26 NOTE — Progress Notes (Signed)
Chief Complaint  Patient presents with  . Follow-up    follow-up on cholesterol   HPI: Hyperlipidemia follow-up:  Patient is reportedly following a low-fat, low cholesterol diet (for the most part).  Compliant with medications and denies medication side effects.  She doesn't need a prescription today, but would like samples, if available.  She reports that her doctor in [place] also does labwork, and believes that she had a more comprehensive panel done there (ie including full chem panel, possibly thyroid)--last checked in March.  She will at some point bring copies (either last years, or after her next set when she goes this winter).  Husband passed away in 05-03-2022 from liver cancer.  Daughter lives nearby.  She keeps very busy, traveling, and is doing well.  She is complaining of itchy rash on her abdomen, left side more than right.Thinks it has been on and off since she gained weight, after menopause.  It seems to be a little better today.  Past Medical History  Diagnosis Date  . Pure hypercholesterolemia   . Basal cell cancer     nose; Dr. Terri Piedra   . Abnormal TSH 2004    normal since  . Diverticulosis 8/07  . Migraine childhood    resolved   Past Surgical History  Procedure Date  . Total knee arthroplasty 07/2003    left  . Breast biopsy 1984    benign  . Tonsillectomy age 37   History   Social History  . Marital Status: Widowed    Spouse Name: N/A    Number of Children: 2  . Years of Education: N/A   Occupational History  . retired Retail buyer    Social History Main Topics  . Smoking status: Never Smoker   . Smokeless tobacco: Never Used  . Alcohol Use: Yes     occasional glass of wine.  . Drug Use: No  . Sexually Active: Not on file   Other Topics Concern  . Not on file   Social History Narrative   Married. Lives 1/2 the year in [place]. Her husband (retired Librarian, academic) died from liver cancer in May 04, 2011.  Daughter lives in [place]   Current Outpatient  Prescriptions on File Prior to Visit  Medication Sig Dispense Refill  . Calcium Carbonate-Vitamin D (CALTRATE 600+D) 600-400 MG-UNIT per tablet Take 1 tablet by mouth daily.        . Multiple Vitamins-Minerals (CENTRUM PO) Take 1 tablet by mouth daily.        . rosuvastatin (CRESTOR) 20 MG tablet Take 1 tablet (20 mg total) by mouth daily.  28 tablet  0  . dextromethorphan (DELSYM) 30 MG/5ML liquid Take 60 mg by mouth as needed.      . fexofenadine (ALLEGRA) 180 MG tablet Take 180 mg by mouth daily.      . fluticasone (FLONASE) 50 MCG/ACT nasal spray Place 2 sprays into the nose daily.  16 g  6   No Known Allergies  ROS: Ears are always itchy. Denies fevers, sinus pain, sore throat, cough, shortness of breath, chest pain, palpitations, myalgias, weakness, headaches, dizziness or other complaints.  See HPI  PHYSICAL EXAM: BP 122/76  Pulse 64  Ht 4' 10.25" (1.48 m)  Wt 148 lb (67.132 kg)  BMI 30.67 kg/m2 Well developed, well-appearing female in no distress HEENT:  TM's and EAC's normal.  OP clear Neck : no lymphadenopathy Heart: regular rate and rhythm without murmur Lungs: clear bilaterally Abdomen: soft, nontender, no organomegaly or mass Skin:  excorations at L lower abdomen.  Area at skin fold of erythema.  No maceration or satellite pustules.  Right side is normal Extremities: no edema, 2+ pulses  ASSESSMENT/PLAN:  1. Pure hypercholesterolemia  Lipid panel, Hepatic function panel, rosuvastatin (CRESTOR) 20 MG tablet  2. Intertrigo     Intertrigo--very mild.  Recommend use of powder, change to cotton underwear.  Use OTC antifungal such as lotrimin twice daily as needed when flares up.  Itchy ears--likely a symptom of allergy.  Restart antihistamine (ie claritin or allegra or zyrtec).  Hyperlipidemia--labs today.  Plan for more comprehensive labs while in [facility], and patient to get copies sent here

## 2011-11-26 NOTE — Patient Instructions (Signed)
Intertrigo (rash on abdomen)--very mild.  Recommend use of powder (cornstarch, or medicated powder such as Gold Bond), change to cotton underwear.  Use OTC antifungal such as lotrimin twice daily as needed when flares up.  Itchy ears--likely a symptom of allergy.  Restart antihistamine (ie claritin or allegra or zyrtec).  Continue your Crestor

## 2011-11-27 ENCOUNTER — Encounter: Payer: Self-pay | Admitting: Family Medicine

## 2011-11-27 LAB — HEPATIC FUNCTION PANEL
Albumin: 4.5 g/dL (ref 3.5–5.2)
Bilirubin, Direct: 0.2 mg/dL (ref 0.0–0.3)
Total Bilirubin: 1 mg/dL (ref 0.3–1.2)

## 2011-11-27 LAB — LIPID PANEL
HDL: 74 mg/dL (ref >39)
LDL Cholesterol: 74 mg/dL (ref 0–99)
Total CHOL/HDL Ratio: 2.3 Ratio
Triglycerides: 98 mg/dL (ref <150)
VLDL: 20 mg/dL (ref 0–40)

## 2012-01-06 ENCOUNTER — Encounter: Payer: Self-pay | Admitting: Family Medicine

## 2012-01-06 ENCOUNTER — Ambulatory Visit (INDEPENDENT_AMBULATORY_CARE_PROVIDER_SITE_OTHER): Payer: Medicare Other | Admitting: Family Medicine

## 2012-01-06 VITALS — BP 112/72 | HR 76 | Temp 98.1°F | Ht 58.75 in | Wt 151.0 lb

## 2012-01-06 DIAGNOSIS — S39012A Strain of muscle, fascia and tendon of lower back, initial encounter: Secondary | ICD-10-CM

## 2012-01-06 DIAGNOSIS — E7800 Pure hypercholesterolemia, unspecified: Secondary | ICD-10-CM

## 2012-01-06 DIAGNOSIS — J309 Allergic rhinitis, unspecified: Secondary | ICD-10-CM

## 2012-01-06 DIAGNOSIS — IMO0002 Reserved for concepts with insufficient information to code with codable children: Secondary | ICD-10-CM

## 2012-01-06 MED ORDER — ROSUVASTATIN CALCIUM 20 MG PO TABS: 20.0000 mg | ORAL_TABLET | Freq: Every day | ORAL | Status: DC

## 2012-01-06 NOTE — Patient Instructions (Signed)
Allergies--restart either Claritin or Allegra, use daily--likely will need to use for at least 1-2 months, possibly chronically.  If your allergies symptoms improve significantly with antihistamine alone, then you don't need to restart Flonase.  If your still have significant allergy symptoms (runny nose, postnasal drainage, sneezing) despite using claritin or allegra daily, then restart using Flonase--remember that Flonase needs to be used every day, 2 gentle sniffs into each nostril, once daily  You may resume mucinex use if the mucus in the throat is very thick, and to help with chest congestion.  You may also use sinus rinses (or Neti-pot) to help with sinus congestion/pain  Muscle strain--try using heat at least 15 minutes 2-3 times/day.  Massage and stretches can also help.  You may continue to use either Aleve or ibuprofen as needed for pain.

## 2012-01-06 NOTE — Progress Notes (Signed)
Chief Complaint  Patient presents with  . Cough    slight cough, nasal congestion and states she has a pain in her back, upper right side. Hurts when she sneezes but does state she has been lifting luggage quite a bit lately. Pt would like to wait on her flu shot until she is feeling better.   Seen in May with congestion, prior to trip to [place].  She never needed the amoxacillin that was prescribed.  She then went to [place] in September, and started with sore throat, cough, and postnasal drip.  She started the Amoxacillin during her trip--she took it for 8 days.  Sore throat resolved.  She was treated with lozenges and cough medication while in [place].  Since returning from [place], her back has been hurting her more.  Pain is on right side, under her shoulder blade. Sometimes is worse with deep breath.  Tried some advil in the last couple of days, without benefit.  Hasn't tried heat. She is worried about pneumonia, but thinks it could be muscular, related to carrying a lot of luggage recently.    Denies fevers, just occasional hot flashes.  Denies nausea, vomiting, diarrhea.  Phlegm is clear in color, sometimes a little yellower in the morning.  Some pressure in her sinuses and chest.  Denies shortness of breath.  Cough is just occasional.  Hasn't taken any antihistamines, mucinex or other OTC meds (except those mentioned above from [place]) since about June.  She is asking for samples of Crestor. She reports having gotten pneumovax while in [place]  Past Medical History  Diagnosis Date  . Pure hypercholesterolemia   . Basal cell cancer     nose; Dr. Terri Piedra   . Abnormal TSH 2004    normal since  . Diverticulosis 8/07  . Migraine childhood    resolved   Past Surgical History  Procedure Date  . Total knee arthroplasty 07/2003    left  . Breast biopsy 1984    benign  . Tonsillectomy age 78   History   Social History  . Marital Status: Widowed    Spouse Name: N/A    Number of Children: 2   . Years of Education: N/A   Occupational History  . retired Retail buyer    Social History Main Topics  . Smoking status: Never Smoker   . Smokeless tobacco: Never Used  . Alcohol Use: Yes     occasional glass of wine.  . Drug Use: No  . Sexually Active: Not on file   Other Topics Concern  . Not on file   Social History Narrative   Married. Lives 1/2 the year in [place]. Her husband (retired Librarian, academic) died from liver cancer in May 16, 2011.  Daughter lives in [place]   Current Outpatient Prescriptions on File Prior to Visit  Medication Sig Dispense Refill  . Calcium Carbonate-Vitamin D (CALTRATE 600+D) 600-400 MG-UNIT per tablet Take 1 tablet by mouth daily.        . Multiple Vitamins-Minerals (CENTRUM PO) Take 1 tablet by mouth daily.        Marland Kitchen DISCONTD: rosuvastatin (CRESTOR) 20 MG tablet Take 1 tablet (20 mg total) by mouth daily.  28 tablet  0  . fexofenadine (ALLEGRA) 180 MG tablet Take 180 mg by mouth daily.      . fluticasone (FLONASE) 50 MCG/ACT nasal spray Place 2 sprays into the nose daily.  16 g  6   No Known Allergies  ROS:  Denies fevers, headaches, dizziness, chest  pain, palpitations, shortness of breath, cough.  Denies nausea, vomiting, bowel changes, skin rash, myalgias.  See HPI  PHYSICAL EXAM: BP 112/72  Pulse 76  Temp 98.1 F (36.7 C) (Oral)  Ht 4' 10.75" (1.492 m)  Wt 151 lb (68.493 kg)  BMI 30.76 kg/m2 Well developed, pleasant elderly female in no distress.  Occasional throat clearing HEENT:  PERRL, conjunctiva clear. TM's and EAC's normal.  OP clear.  Nasal mucosa mildly edematous, no purulence.  Sinuses nontender Neck: no lymphadenopathy, thyromegaly or mass Heart: regular rate and rhythm without murmur Lungs: clear bilaterally. No wheezes, rales, ronchi Back: no spinal tenderness or CVA tenderness.  Tender at right rhomboid muscles, inferiorly, medial to scapula.  Only minimal spasm noted, tender to palpation. Skin: no rash Neuro: alert and  oriented.  Normal gait.  Cranial nerves intact  ASSESSMENT/PLAN:  1. Allergic rhinitis, cause unspecified    2. Back strain    3. Pure hypercholesterolemia  rosuvastatin (CRESTOR) 20 MG tablet   Allergies--restart either Claritin or Allegra, use daily--likely will need to use for at least 1-2 months, possibly chronically.  If your allergies symptoms improve significantly with antihistamine alone, then you don't need to restart Flonase.  If your still have significant allergy symptoms (runny nose, postnasal drainage, sneezing) despite using claritin or allegra daily, then restart using Flonase--remember that Flonase needs to be used every day, 2 gentle sniffs into each nostril, once daily  You may resume mucinex use if the mucus in the throat is very thick, and to help with chest congestion.  You may also use sinus rinses (or Neti-pot) to help with sinus congestion/pain.  Reviewed signs/symptoms of bacterial infection/sinusitis.  Muscle strain--try using heat at least 15 minutes 2-3 times/day.  Massage and stretches can also help.  You may continue to use either Aleve or ibuprofen as needed for pain.  Hyperlipidemia--samples given

## 2012-01-21 ENCOUNTER — Telehealth: Payer: Self-pay | Admitting: Family Medicine

## 2012-01-21 NOTE — Telephone Encounter (Signed)
If she has been having ongoing chest tightness and cough since her visit a few weeks ago, then okay for CXR, and may need to f/u with [place], depending on the results.  If symptoms resolved but just recurred, then CXR likely isn't indicated, unless abnormal findings on exam; I'd prefer to see her and examine her first if symptoms just recently restarted (and not ongoing since last visit).  If there is any question about it, just schedule OV

## 2012-01-22 ENCOUNTER — Ambulatory Visit
Admission: RE | Admit: 2012-01-22 | Discharge: 2012-01-22 | Disposition: A | Discharge status: Home / Self Care | Payer: Medicare Other | Source: Ambulatory Visit | Attending: Family Medicine | Admitting: Family Medicine

## 2012-01-22 ENCOUNTER — Other Ambulatory Visit: Payer: Self-pay | Admitting: *Deleted

## 2012-01-22 ENCOUNTER — Telehealth: Payer: Self-pay | Admitting: *Deleted

## 2012-01-22 DIAGNOSIS — R0789 Other chest pain: Secondary | ICD-10-CM

## 2012-01-22 DIAGNOSIS — R05 Cough: Secondary | ICD-10-CM

## 2012-01-22 DIAGNOSIS — R059 Cough, unspecified: Secondary | ICD-10-CM

## 2012-01-22 NOTE — Telephone Encounter (Signed)
Called patient and she did tell me that she has had this ongoing chest tightness and cough since her last visit, I told her I would put order in for CXR and would call her with the results and that she may need to follow up here in out office with Dr.Knapp-pt agreed and verbalized understanding.

## 2012-01-27 ENCOUNTER — Encounter: Payer: Self-pay | Admitting: Family Medicine

## 2012-01-27 ENCOUNTER — Ambulatory Visit (INDEPENDENT_AMBULATORY_CARE_PROVIDER_SITE_OTHER): Payer: Medicare Other | Admitting: Family Medicine

## 2012-01-27 VITALS — BP 124/80 | HR 76 | Temp 97.8°F | Ht 59.0 in | Wt 151.0 lb

## 2012-01-27 DIAGNOSIS — J309 Allergic rhinitis, unspecified: Secondary | ICD-10-CM

## 2012-01-27 DIAGNOSIS — R0989 Other specified symptoms and signs involving the circulatory and respiratory systems: Secondary | ICD-10-CM

## 2012-01-27 DIAGNOSIS — R079 Chest pain, unspecified: Secondary | ICD-10-CM

## 2012-01-27 DIAGNOSIS — R0609 Other forms of dyspnea: Secondary | ICD-10-CM

## 2012-01-27 DIAGNOSIS — Z23 Encounter for immunization: Secondary | ICD-10-CM

## 2012-01-27 DIAGNOSIS — R0600 Dyspnea, unspecified: Secondary | ICD-10-CM

## 2012-01-27 LAB — CBC WITH DIFFERENTIAL/PLATELET
Basophils Absolute: 0.1 10*3/uL (ref 0.0–0.1)
Eosinophils Absolute: 0.4 10*3/uL (ref 0.0–0.7)
Eosinophils Relative: 5 % (ref 0–5)
MCH: 30.5 pg (ref 26.0–34.0)
MCHC: 34.9 g/dL (ref 30.0–36.0)
MCV: 87.2 fL (ref 78.0–100.0)
Platelets: 290 10*3/uL (ref 150–400)
RDW: 13.3 % (ref 11.5–15.5)

## 2012-01-27 MED ORDER — INFLUENZA VIRUS VACC SPLIT PF IM SUSP: 0.5000 mL | Freq: Once | INTRAMUSCULAR | Status: DC

## 2012-01-27 NOTE — Patient Instructions (Addendum)
Your EKG is normal. We are checking your blood for D-dimer (which is an indicator of a blood clot), and any sign of infection or anemia (complete blood count).  If the D-dimer is abnormal, then we will need to send you for scans to look for pulmonary embolism.  Call us later today to let us know if you felt any benefit from the breathing treatment (with albuterol nebulizer) you received in the office today.  If your chest pressure is better, then we can send an inhaler to your pharmacy for you to try.  We discussed how to use it properly.  If you have trouble coordinating the pumping and the breathing of the medication, ask the pharmacist for an aerochamber (spacer), as we discussed.

## 2012-01-27 NOTE — Progress Notes (Signed)
Chief Complaint  Patient presents with  . Advice Only    chest tightness,SOB, coughing since she got back from [place] for 9 hours, pt got back on  oct 2nd.   HPI: She was in [place] in September, and got sick while she was there (URI, sore throat), and took amoxacillin for 8 days.  She felt like her symptoms improved, except for some ongoing pressure in her chest, sinuses, occasional cough, and at that time she was also having muscular upper back pain.  She was seen here 10/9 with some persistent chest tightness, but mainly with complaint of back pain, that was felt to be muscular.  The back pain has improved.  Patient called here 10/24 with complaints of chest tightness, slight cough, requesting CXR. The chest tightness/cough has been going on since prior to her last visit, 10/9.  She had a CXR done 10/25 which was normal.  She describes herself as having constant "tightness" in her chest, and occasional shortness of breath.  Allergies have been doing okay.  She had been taking Claritin, but irregularly.  Took it for about a week or two, then stopped.  She didn't feel that her symptoms of chest tightness were any better while taking the claritin.  Used Mucinex only sporadically, also didn't see any improvement. Hasn't restarted the Flonase.  Chest tightness doesn't bother her at night, no problems sleeping.  She is only aware of it during the day.  Didn't seem to be related to exercise or worse with exercise.  No shortness of breath with exercise.  No longer having the muscle pain in her right shoulder blade, feels like she is sore everywhere in her chest, and back.  Denies being tender to touch or increased pain with breathing.  She spoke to her son in [place], who is an MD, who mentioned possibility of PE, since symptoms started after long plane flight. She denies any swelling or pain in her lower extremities.  Past Medical History  Diagnosis Date  . Pure hypercholesterolemia   . Basal cell cancer       nose; Dr. Terri Piedra   . Abnormal TSH 2004    normal since  . Diverticulosis 8/07  . Migraine childhood    resolved   Past Surgical History  Procedure Date  . Total knee arthroplasty 07/2003    left  . Breast biopsy 1984    benign  . Tonsillectomy age 5   History   Social History  . Marital Status: Widowed    Spouse Name: N/A    Number of Children: 2  . Years of Education: N/A   Occupational History  . retired Retail buyer    Social History Main Topics  . Smoking status: Never Smoker   . Smokeless tobacco: Never Used  . Alcohol Use: Yes     occasional glass of wine.  . Drug Use: No  . Sexually Active: Not on file   Other Topics Concern  . Not on file   Social History Narrative   Married. Lives 1/2 the year in [place]. Her husband (retired Librarian, academic) died from liver cancer in 05/09/2011.  Daughter lives in [place]    Past Medical History  Diagnosis Date  . Pure hypercholesterolemia   . Basal cell cancer     nose; Dr. Terri Piedra   . Abnormal TSH 2004    normal since  . Diverticulosis 8/07  . Migraine childhood    resolved   Past Surgical History  Procedure Date  . Total  knee arthroplasty 07/2003    left  . Breast biopsy 1984    benign  . Tonsillectomy age 24   History   Social History  . Marital Status: Widowed    Spouse Name: N/A    Number of Children: 2  . Years of Education: N/A   Occupational History  . retired Retail buyer    Social History Main Topics  . Smoking status: Never Smoker   . Smokeless tobacco: Never Used  . Alcohol Use: Yes     occasional glass of wine.  . Drug Use: No  . Sexually Active: Not on file   Other Topics Concern  . Not on file   Social History Narrative   Married. Lives 1/2 the year in [place]. Her husband (retired Librarian, academic) died from liver cancer in 04-29-2011.  Daughter lives in [place]     Current Outpatient Prescriptions on File Prior to Visit  Medication Sig Dispense Refill  . Calcium Carbonate-Vitamin  D (CALTRATE 600+D) 600-400 MG-UNIT per tablet Take 1 tablet by mouth daily.        . Multiple Vitamins-Minerals (CENTRUM PO) Take 1 tablet by mouth daily.        . rosuvastatin (CRESTOR) 20 MG tablet Take 1 tablet (20 mg total) by mouth daily.  28 tablet  0  . fexofenadine (ALLEGRA) 180 MG tablet Take 180 mg by mouth daily.      . fluticasone (FLONASE) 50 MCG/ACT nasal spray Place 2 sprays into the nose daily.  16 g  6   No Known Allergies  ROS:  Denies fevers, sore throat, nausea, vomiting, bowel changes, skin rash, bleeding/bruising, headaches, dizziness.  See HPI  PHYSICAL EXAM: BP 124/80  Pulse 76  Temp 97.8 F (36.6 C)  Ht 4\' 11"  (1.499 m)  Wt 151 lb (68.493 kg)  BMI 30.50 kg/m2 02 sat 97%  Sneezing in the office, sounds slightly congested.  Pleasant female, somewhat anxious, otherwise in no acute distress HEENT:  PERRL, EOMI, TM's and EAC's normal.  Nasal mucosa moderately edematous, some crusting in both nares.  Sinuses nontender.  OP clear Neck: no lymphadenopathy, thyromegaly or mass Heart: regular rate and rhythm without murmur Lungs: clear bilaterally.  Good air movement.  No wheeze or cough with forced expiration.  She was given a treatment with albuterol nebulizer, to see if there was any improvement in her chest tightness.  It was difficult for her to say immediately if she had significant improvement.  She tolerated procedure without side effects.  Lung exam was unchanged (normal prior to treatment). Extremities: no clubbing, cyanosis or edema. Calves soft, nontender, negative Homan sign. Skin: no rashes, bruising Back: no spine or CVA tenderness.  No muscle spasm Chest: nontender to palpation  EKG: NSR rate 74. Borderline LAE.  No acute abnormality.  ASSESSMENT/PLAN: 1. Chest pain  CBC with Differential, D-dimer, quantitative  2. Allergic rhinitis, cause unspecified    3. Dyspnea    4. Need for prophylactic vaccination and inoculation against influenza  influenza   inactive virus vaccine (FLUZONE/FLUARIX) injection 0.5 mL    Her exam is normal, other than allergies.  Reassured patient regarding EKG findings, pulse oximetry, and doubt PE given no evidence of DVT and persistence of mild symptoms without worsening.  For her (and son's) piece of mind, will order D-dimer.  Discussed possibility of false pos/neg, and if + will need scan.  Given lack of suspicion on my part, if D-dimer is negative, I don't think we need to pursue  scan to r/o PE (unless symptoms worsen, tachycardia, hypoxia, etc.)   Given that the only finding on exam is that of allergies, consider chest congestion related to allergies/PND as cause of her symptoms.  Will try nebulizer to see if symptoms improve, and if so, can rx albuterol inhaler for her to use prn.  She couldn't notice any immediate benefit.  Advised to call the office later today if she felt that she responded to the nebulizer treatment with improvement in her chest tightness, to have albuterol inhaler called to pharmacy. Educated regarding risks/side effects, and how to properly use inhaler.  EKG Nebulizer D-dimer, CBC Flu shot given

## 2012-03-07 ENCOUNTER — Telehealth: Payer: Self-pay | Admitting: Family Medicine

## 2012-03-07 DIAGNOSIS — E7800 Pure hypercholesterolemia, unspecified: Secondary | ICD-10-CM

## 2012-03-07 MED ORDER — ROSUVASTATIN CALCIUM 20 MG PO TABS: 20.0000 mg | ORAL_TABLET | Freq: Every day | ORAL | Status: DC

## 2012-03-07 NOTE — Telephone Encounter (Signed)
Pt called and stated she needed samples of crestor 20 mg. Call when ready.

## 2012-03-07 NOTE — Telephone Encounter (Signed)
Called patient

## 2012-12-29 ENCOUNTER — Ambulatory Visit (INDEPENDENT_AMBULATORY_CARE_PROVIDER_SITE_OTHER): Payer: Medicare Other | Admitting: Family Medicine

## 2012-12-29 ENCOUNTER — Encounter: Payer: Self-pay | Admitting: Family Medicine

## 2012-12-29 VITALS — BP 130/78 | HR 72 | Ht 59.0 in | Wt 151.0 lb

## 2012-12-29 DIAGNOSIS — Z79899 Other long term (current) drug therapy: Secondary | ICD-10-CM

## 2012-12-29 DIAGNOSIS — M1610 Unilateral primary osteoarthritis, unspecified hip: Secondary | ICD-10-CM

## 2012-12-29 DIAGNOSIS — E7800 Pure hypercholesterolemia, unspecified: Secondary | ICD-10-CM

## 2012-12-29 DIAGNOSIS — N644 Mastodynia: Secondary | ICD-10-CM

## 2012-12-29 LAB — HEPATIC FUNCTION PANEL
Albumin: 4.2 g/dL (ref 3.5–5.2)
Alkaline Phosphatase: 66 U/L (ref 39–117)
Indirect Bilirubin: 1 mg/dL — ABNORMAL HIGH (ref 0.0–0.9)
Total Bilirubin: 1.2 mg/dL (ref 0.3–1.2)

## 2012-12-29 LAB — LIPID PANEL
LDL Cholesterol: 109 mg/dL — ABNORMAL HIGH (ref 0–99)
VLDL: 25 mg/dL (ref 0–40)

## 2012-12-29 MED ORDER — ROSUVASTATIN CALCIUM 20 MG PO TABS: 20.0000 mg | ORAL_TABLET | Freq: Every day | ORAL | Status: DC

## 2012-12-29 NOTE — Patient Instructions (Addendum)
[facility] are [facility] to [facility] your breast [facility].  They might also perform an ultrasound as part of evaluation.  Continue your current medications.  We will call you when the high dose flu shots come in (expected in 1-2 weeks).

## 2012-12-29 NOTE — Progress Notes (Signed)
Chief Complaint  Patient presents with  . Hyperlipidemia    fasting med check for cholesterol. Brought records and wants to keep you UTD on medcal occurances over the last year.    Patient presents for med check.  She brings in copies of labs, mammograms, and other information to update me on what has gone on with her health since her last visit:  02/2012--saw Dr. Pollyann Kennedy for nosebleed; treated with cautery. She got hearing aids. 06/2012 --eye exam--noted start of cataracts.  Will likely have surgery in [place] this coming spring. 06/2012--pain in left knee and hip.  She is s/p TKR by Dr. Thomasena Edis.  She saw him in May, had x-rays of hip and knee, showing some arthritic changes.  She took etodolac 400mg  daily for a month, but it didn't help.  She then had injection of left hip under fluoro in July, and symptoms are much improved.  She just got back from [place].  Had some diarrhea the week before returning; took imodium on flight home; no problems since.  Sister was recently diagnosed with breast cancer.  She had mammo in [place].  She has slight discomfort in R breast when lying on her stomach.  Thinks it might be some anxiety related to her sister's recent diagnosis (she recalls having leg pain when her other sister was dealing with cancer in the leg).  She has had the pain since about April.  Her last mammo was a screening mammo in March  Primary residence is in [place], usually lives 6 months in [place], sometimes more to be with her grandchildren.  Will be in [facility] from December to May.    She updates me on labs/studies done in [facility] (see scanned sheets) She had mammo done 06/14/12, pap smear done 05/12/12, negative AWV done 06/28/12 She brought in copies of her labs done 07/14/12:  Normal CBC (had slight abnl when CBC done in March, results not brought in; all normal on repeat); remainder of labs were from 06/09/12: normal u/a, normal c-met (fasting glucose 82), CK 90 (normal). Normal TSH.   Chol 196, HDL 67, LDL 96, TG  166  Past Medical History  Diagnosis Date  . Pure hypercholesterolemia   . Basal cell cancer     nose; Dr. Terri Piedra   . Abnormal TSH 2004    normal since  . Diverticulosis 8/07  . Migraine childhood    resolved   Past Surgical History  Procedure Laterality Date  . Total knee arthroplasty Left 07/2003    Dr. Thomasena Edis  . Breast biopsy Left 1984    benign  . Tonsillectomy  age 63   History   Social History  . Marital Status: Widowed    Spouse Name: N/A    Number of Children: 2  . Years of Education: N/A   Occupational History  . retired Retail buyer    Social History Main Topics  . Smoking status: Never Smoker   . Smokeless tobacco: Never Used  . Alcohol Use: Yes     Comment: occasional glass of wine.  . Drug Use: No  . Sexual Activity: Not on file   Other Topics Concern  . Not on file   Social History Narrative   Widowed. Lives 1/2 the year in [place]. Her husband (retired Librarian, academic) died from liver cancer in Apr 19, 2011.  Daughter lives in [place]; 2 grandchildren here, and 2 in [place]   Current outpatient prescriptions:Calcium Carbonate-Vitamin D (CALTRATE 600+D) 600-400 MG-UNIT per tablet, Take 1 tablet by mouth daily.  ,  Disp: , Rfl: ;  Multiple Vitamins-Minerals (CENTRUM PO), Take 1 tablet by mouth daily.  , Disp: , Rfl: ;  rosuvastatin (CRESTOR) 20 MG tablet, Take 1 tablet (20 mg total) by mouth daily., Disp: 14 tablet, Rfl: 0;  fexofenadine (ALLEGRA) 180 MG tablet, Take 180 mg by mouth daily., Disp: , Rfl:  fluticasone (FLONASE) 50 MCG/ACT nasal spray, Place 2 sprays into the nose daily., Disp: 16 g, Rfl: 6  No Known Allergies  ROS:  Denies fevers, chills, nausea, vomiting; diarrhea resolved.  No bloody or black stools, no abdominal pain.  Denies chest pain, palpitations, shortness of breath.  Denies headaches, dizziness.  Denies depression.  Hip/knee pain improved after cortisone shot to hip.  PHYSICAL EXAM: BP 130/78  Pulse 72  Ht 4\' 11"  (1.499 m)  Wt 151  lb (68.493 kg)  BMI 30.48 kg/m2 Well developed, very pleasant female in no distress Neck: no lymphadenopathy, thyromegaly Heart: regular rate and rhythm without murmur Lungs: clear bilaterally Abdomen: soft, nontender, no organomegaly or mass Breasts: no nipple discharge, inversion, skin dimpling. No masses are palpable.  She has mild discomfort at 1 o'clock position R breast. No mass palpable. Extremities: no edema, 2+ pulse Psych: norma mood, affect, hygiene and grooming Neuro: alert and oriented. Normal strength, sensation, gait  ASSESSMENT/PLAN:  Pure hypercholesterolemia - Plan: Lipid panel, Hepatic function panel, rosuvastatin (CRESTOR) 20 MG tablet  Encounter for long-term (current) use of other medications - Plan: Hepatic function panel  Breast pain, right - eval with diagnostic mammo (and/or u/s if appropriate) - Plan: MM Digital Diagnostic Unilat R, US Breast Left  Hip arthritis - improved s/p cortisone shot  Recent gastroenteritis, resolved

## 2012-12-30 ENCOUNTER — Encounter: Payer: Self-pay | Admitting: Family Medicine

## 2013-01-02 ENCOUNTER — Telehealth: Payer: Self-pay | Admitting: Family Medicine

## 2013-01-02 NOTE — Telephone Encounter (Signed)
Pt called for lab results   Please advise [phone]

## 2013-01-02 NOTE — Telephone Encounter (Signed)
Message came after I left for the day.  Please advise pt that letter was sent out with her results (so that she could have copy of her results to show to her doctors in [place] also).  Cholesterol and liver tests were fine. Continue the current medication and current dose.

## 2013-01-03 NOTE — Telephone Encounter (Signed)
Called pt and advised of Dr. Delford Field note.

## 2013-01-10 ENCOUNTER — Ambulatory Visit
Admission: RE | Admit: 2013-01-10 | Discharge: 2013-01-10 | Disposition: A | Discharge status: Home / Self Care | Payer: Medicare Other | Source: Ambulatory Visit | Attending: Family Medicine | Admitting: Family Medicine

## 2013-01-10 ENCOUNTER — Other Ambulatory Visit (INDEPENDENT_AMBULATORY_CARE_PROVIDER_SITE_OTHER): Payer: Medicare Other

## 2013-01-10 DIAGNOSIS — Z23 Encounter for immunization: Secondary | ICD-10-CM

## 2013-01-10 DIAGNOSIS — N644 Mastodynia: Secondary | ICD-10-CM

## 2013-02-13 ENCOUNTER — Telehealth: Payer: Self-pay | Admitting: Family Medicine

## 2013-02-13 ENCOUNTER — Other Ambulatory Visit: Payer: Self-pay | Admitting: *Deleted

## 2013-02-13 DIAGNOSIS — E7800 Pure hypercholesterolemia, unspecified: Secondary | ICD-10-CM

## 2013-02-13 MED ORDER — ROSUVASTATIN CALCIUM 20 MG PO TABS: 20.0000 mg | ORAL_TABLET | Freq: Every day | ORAL | Status: DC

## 2013-02-13 NOTE — Telephone Encounter (Signed)
Done and pt notified. 

## 2013-02-21 NOTE — Telephone Encounter (Signed)
Done

## 2013-10-05 DIAGNOSIS — H04123 Dry eye syndrome of bilateral lacrimal glands: Secondary | ICD-10-CM | POA: Insufficient documentation

## 2013-10-05 DIAGNOSIS — H02833 Dermatochalasis of right eye, unspecified eyelid: Secondary | ICD-10-CM | POA: Insufficient documentation

## 2013-10-24 ENCOUNTER — Encounter (HOSPITAL_COMMUNITY): Payer: Self-pay | Admitting: Emergency Medicine

## 2013-10-24 ENCOUNTER — Emergency Department (HOSPITAL_COMMUNITY): Payer: Medicare Other

## 2013-10-24 ENCOUNTER — Emergency Department (HOSPITAL_COMMUNITY)
Admission: EM | Admit: 2013-10-24 | Discharge: 2013-10-24 | Disposition: A | Discharge status: Home / Self Care | Payer: Medicare Other | Attending: Emergency Medicine | Admitting: Emergency Medicine

## 2013-10-24 DIAGNOSIS — E7800 Pure hypercholesterolemia, unspecified: Secondary | ICD-10-CM | POA: Insufficient documentation

## 2013-10-24 DIAGNOSIS — Z9889 Other specified postprocedural states: Secondary | ICD-10-CM | POA: Diagnosis not present

## 2013-10-24 DIAGNOSIS — Z8719 Personal history of other diseases of the digestive system: Secondary | ICD-10-CM | POA: Diagnosis not present

## 2013-10-24 DIAGNOSIS — Z79899 Other long term (current) drug therapy: Secondary | ICD-10-CM | POA: Diagnosis not present

## 2013-10-24 DIAGNOSIS — R079 Chest pain, unspecified: Secondary | ICD-10-CM | POA: Diagnosis present

## 2013-10-24 DIAGNOSIS — Z8522 Personal history of malignant neoplasm of nasal cavities, middle ear, and accessory sinuses: Secondary | ICD-10-CM | POA: Diagnosis not present

## 2013-10-24 DIAGNOSIS — R0789 Other chest pain: Secondary | ICD-10-CM | POA: Diagnosis not present

## 2013-10-24 DIAGNOSIS — Z8679 Personal history of other diseases of the circulatory system: Secondary | ICD-10-CM | POA: Diagnosis not present

## 2013-10-24 DIAGNOSIS — IMO0002 Reserved for concepts with insufficient information to code with codable children: Secondary | ICD-10-CM | POA: Diagnosis not present

## 2013-10-24 LAB — COMPREHENSIVE METABOLIC PANEL
ALT: 24 U/L (ref 0–35)
AST: 35 U/L (ref 0–37)
Albumin: 4.1 g/dL (ref 3.5–5.2)
Alkaline Phosphatase: 76 U/L (ref 39–117)
Anion gap: 10 (ref 5–15)
BUN: 12 mg/dL (ref 6–23)
CALCIUM: 9.7 mg/dL (ref 8.4–10.5)
CO2: 31 mEq/L (ref 19–32)
Chloride: 103 mEq/L (ref 96–112)
Creatinine, Ser: 0.73 mg/dL (ref 0.50–1.10)
GFR calc Af Amer: >90 mL/min (ref >90)
GFR calc non Af Amer: 80 mL/min — ABNORMAL LOW (ref >90)
Glucose, Bld: 97 mg/dL (ref 70–99)
Potassium: 5.1 mEq/L (ref 3.7–5.3)
SODIUM: 144 meq/L (ref 137–147)
TOTAL PROTEIN: 7.2 g/dL (ref 6.0–8.3)
Total Bilirubin: 0.9 mg/dL (ref 0.3–1.2)

## 2013-10-24 LAB — CBC WITH DIFFERENTIAL/PLATELET
BASOS ABS: 0 10*3/uL (ref 0.0–0.1)
Basophils Relative: 0 % (ref 0–1)
EOS ABS: 0.1 10*3/uL (ref 0.0–0.7)
EOS PCT: 2 % (ref 0–5)
HCT: 47.6 % — ABNORMAL HIGH (ref 36.0–46.0)
Hemoglobin: 15.9 g/dL — ABNORMAL HIGH (ref 12.0–15.0)
LYMPHS PCT: 35 % (ref 12–46)
Lymphs Abs: 2.4 10*3/uL (ref 0.7–4.0)
MCH: 30.3 pg (ref 26.0–34.0)
MCHC: 33.4 g/dL (ref 30.0–36.0)
MCV: 90.7 fL (ref 78.0–100.0)
Monocytes Absolute: 0.9 10*3/uL (ref 0.1–1.0)
Monocytes Relative: 13 % — ABNORMAL HIGH (ref 3–12)
NEUTROS PCT: 50 % (ref 43–77)
Neutro Abs: 3.5 10*3/uL (ref 1.7–7.7)
PLATELETS: 234 10*3/uL (ref 150–400)
RBC: 5.25 MIL/uL — ABNORMAL HIGH (ref 3.87–5.11)
RDW: 12.7 % (ref 11.5–15.5)
WBC: 7 10*3/uL (ref 4.0–10.5)

## 2013-10-24 LAB — D-DIMER, QUANTITATIVE: D-Dimer, Quant: 0.46 ug/mL-FEU (ref 0.00–0.48)

## 2013-10-24 LAB — TROPONIN I
Troponin I: <0.3 ng/mL (ref <0.30)
Troponin I: <0.3 ng/mL (ref <0.30)

## 2013-10-24 MED ORDER — SODIUM CHLORIDE 0.9 % IV BOLUS (SEPSIS)
500.0000 mL | Freq: Once | INTRAVENOUS | Status: AC
  Administered 2013-10-24: 500 mL via INTRAVENOUS

## 2013-10-24 MED ORDER — ACETAMINOPHEN 325 MG PO TABS
650.0000 mg | ORAL_TABLET | Freq: Once | ORAL | Status: AC
  Administered 2013-10-24: 650 mg via ORAL
  Filled 2013-10-24: qty 2

## 2013-10-24 NOTE — ED Notes (Signed)
Pt ambulating to restroom again without difficulty. Pt given water- okay by PA

## 2013-10-24 NOTE — ED Notes (Signed)
Dr. Venora Maples at bedside to evaluate pt.

## 2013-10-24 NOTE — Progress Notes (Signed)
   Subjective:    Patient ID: Alexis Hines, female    DOB: [birth year], 80 y.o.   MRN: [id number]  HPI    Review of Systems     Objective:   Physical Exam        Assessment & Plan:  She was apparently having eye surgery done in [place]. The anesthesiologist, Dr Margarita Mail called and stated she was apparently having PACs. He did an EKG which did show questionable anterior ischemia. He felt as if she was not in acute danger. I did however recommend possibility of sending her by EMS to local hospital. He did not feel that that was that acute and will be sending her to [facility] hospital.

## 2013-10-24 NOTE — Discharge Instructions (Signed)
Your labs were reassuring today related to your chest pain. Your heart rhythm was noted to be slightly irregular, but nothing very concerning. You will need to follow up with the cardiologist listed above for ongoing care. Return to the emergency room for any changes or worsening of symptoms.   Chest Pain (Nonspecific) It is often hard to give a diagnosis for the cause of chest pain. There is always a chance that your pain could be related to something serious, such as a heart attack or a blood clot in the lungs. You need to follow up with your doctor. HOME CARE  If antibiotic medicine was given, take it as directed by your doctor. Finish the medicine even if you start to feel better.  For the next few days, avoid activities that bring on chest pain. Continue physical activities as told by your doctor.  Do not use any tobacco products. This includes cigarettes, chewing tobacco, and e-cigarettes.  Avoid drinking alcohol.  Only take medicine as told by your doctor.  Follow your doctor's suggestions for more testing if your chest pain does not go away.  Keep all doctor visits you made. GET HELP IF:  Your chest pain does not go away, even after treatment.  You have a rash with blisters on your chest.  You have a fever. GET HELP RIGHT AWAY IF:   You have more pain or pain that spreads to your arm, neck, jaw, back, or belly (abdomen).  You have shortness of breath.  You cough more than usual or cough up blood.  You have very bad back or belly pain.  You feel sick to your stomach (nauseous) or throw up (vomit).  You have very bad weakness.  You pass out (faint).  You have chills. This is an emergency. Do not wait to see if the problems will go away. Call your local emergency services (911 in [place] Do not drive yourself to the hospital. MAKE SURE YOU:   Understand these instructions.  Will watch your condition.  Will get help right away if you are not doing well or get  worse. Document Released: 09/02/2007 Document Revised: 03/21/2013 Document Reviewed: 09/02/2007 [facility] Patient Information 2015 [place], [place]. This information is not intended to replace advice given to you by your health care provider. Make sure you discuss any questions you have with your health care provider.

## 2013-10-24 NOTE — ED Notes (Signed)
Patient transported to X-ray 

## 2013-10-24 NOTE — ED Provider Notes (Signed)
CSN: [id number]     Arrival date & time 10/24/13  1310 History   First MD Initiated Contact with Patient 10/24/13 1400     Chief Complaint  Patient presents with  . Chest Pain     (Consider location/radiation/quality/duration/timing/severity/associated sxs/prior Treatment) The history is provided by the patient. No language interpreter was used.  Alexis Hines is a 80 y/o F with PMHx of basal cell carcinoma, diverticulosis, migraine, hypercholesterolemia presenting to the ED with chest pain that started this morning after patient had procedure performed. Patient reported that she had eyelid drooping reconstruction performed this morning at approximately 9:25 AM, patient reported that shortly after coming out of anesthesia she started to develop chest pain to the center of her chest described as a tightness sensation - as if her bra was tight - reported that her chest pain lasted for approximately 10 minutes. Stated that the pain went around to her back. Stated that the discomfort lasted for a decent period of time. As per patient, reported that an EKG was performed at the place she had her surgery performed and was stated to be normal. Patient reported that she was recommended to go to her PCP to be re-assessed, stated that she called her PCP who appeared to be out of town and recommended patient to be assessed in the ED setting. Patient reported that her chest pain is no longer present, stated that she is feeling better. Denied shortness of breath, difficulty breathing, weakness, numbness, tingling, leg swelling, diaphoresis, dizziness, headache.  PCP Dr. Kelby Aline  Past Medical History  Diagnosis Date  . Pure hypercholesterolemia   . Basal cell cancer     nose; Dr. Allyson Sabal   . Abnormal TSH 2004    normal since  . Diverticulosis 8/07  . Migraine childhood    resolved   Past Surgical History  Procedure Laterality Date  . Total knee arthroplasty Left 07/2003    Dr. Theda Sers  . Breast  biopsy Left 1984    benign  . Tonsillectomy  age 4   Family History  Problem Relation Age of Onset  . Cancer Sister     bone cancer L leg with mets to lung, diagnosed age 40; s/p amputation  . Hyperlipidemia Brother   . Cancer Sister 33    breast cancer  . Breast cancer Sister 6   History  Substance Use Topics  . Smoking status: Never Smoker   . Smokeless tobacco: Never Used  . Alcohol Use: Yes     Comment: occasional glass of wine.   OB History   Grav Para Term Preterm Abortions TAB SAB Ect Mult Living   3 2   1  1         Review of Systems  Constitutional: Negative for fever, chills and diaphoresis.  Respiratory: Positive for chest tightness. Negative for shortness of breath.   Cardiovascular: Positive for chest pain. Negative for leg swelling.  Neurological: Negative for dizziness, weakness, numbness and headaches.      Allergies  Review of patient's allergies indicates no known allergies.  Home Medications   Prior to Admission medications   Medication Sig Start Date End Date Taking? Authorizing Provider  Calcium Carbonate-Vitamin D (CALTRATE 600+D) 600-400 MG-UNIT per tablet Take 1 tablet by mouth daily.      Historical Provider, MD  fexofenadine (ALLEGRA) 180 MG tablet Take 180 mg by mouth daily.    Historical Provider, MD  fluticasone (FLONASE) 50 MCG/ACT nasal spray Place 2 sprays into the  nose daily. 09/07/11 09/06/12  Rita Ohara, MD  Multiple Vitamins-Minerals (CENTRUM PO) Take 1 tablet by mouth daily.      Historical Provider, MD  rosuvastatin (CRESTOR) 20 MG tablet Take 1 tablet (20 mg total) by mouth daily. 02/13/13   Rita Ohara, MD   BP 155/58  Pulse 70  Temp(Src) 98.4 F (36.9 C) (Oral)  Resp 22  Ht 4\' 11"  (1.499 m)  Wt 142 lb (64.411 kg)  BMI 28.67 kg/m2  SpO2 96% Physical Exam  Nursing note and vitals reviewed. Constitutional: She is oriented to person, place, and time. She appears well-developed and well-nourished. No distress.  HENT:  Head:  Normocephalic and atraumatic.  Mouth/Throat: Oropharynx is clear and moist. No oropharyngeal exudate.  Eyes: Conjunctivae and EOM are normal. Pupils are equal, round, and reactive to light. Right eye exhibits no discharge. Left eye exhibits no discharge.  Neck: Normal range of motion. Neck supple. No tracheal deviation present.  Cardiovascular: Normal rate, regular rhythm and normal heart sounds.  Exam reveals no friction rub.   No murmur heard. Pulses:      Radial pulses are 2+ on the right side, and 2+ on the left side.       Dorsalis pedis pulses are 2+ on the right side, and 2+ on the left side.       Posterior tibial pulses are 2+ on the right side, and 2+ on the left side.  Cap refill < 3 seconds Negative swelling or pitting edema noted to the lower extremities bilaterally   Pulmonary/Chest: Effort normal and breath sounds normal. No respiratory distress. She has no wheezes. She has no rales. She exhibits no tenderness.  Patient is able to speak in full sentences without difficulty  Negative use of accessory muscles Negative stridor Negative pain upon palpation to the chest wall Negative crepitus upon palpation to the chest wall  Musculoskeletal: Normal range of motion. She exhibits no edema and no tenderness.  Full ROM to upper and lower extremities without difficulty noted, negative ataxia noted.  Lymphadenopathy:    She has no cervical adenopathy.  Neurological: She is alert and oriented to person, place, and time. No cranial nerve deficit. She exhibits normal muscle tone. Coordination normal.  Cranial nerves III-XII grossly intact Strength 5+/5+ to upper and lower extremities bilaterally with resistance applied, equal distribution noted Equal grip strength bilaterally  Negative facial drooping Negative slurred speech  Negative aphasia  Skin: Skin is warm and dry. No rash noted. She is not diaphoretic. No erythema.  Psychiatric: She has a normal mood and affect. Her behavior is  normal. Thought content normal.    ED Course  Procedures (including critical care time)  3:22 PM Attending physician, Dr. Marylene Buerger at bedside.   Results for orders placed during the hospital encounter of 10/24/13  CBC WITH DIFFERENTIAL      Result Value Ref Range   WBC 7.0  4.0 - 10.5 K/uL   RBC 5.25 (*) 3.87 - 5.11 MIL/uL   Hemoglobin 15.9 (*) 12.0 - 15.0 g/dL   HCT 47.6 (*) 36.0 - 46.0 %   MCV 90.7  78.0 - 100.0 fL   MCH 30.3  26.0 - 34.0 pg   MCHC 33.4  30.0 - 36.0 g/dL   RDW 12.7  11.5 - 15.5 %   Platelets 234  150 - 400 K/uL   Neutrophils Relative % 50  43 - 77 %   Neutro Abs 3.5  1.7 - 7.7 K/uL   Lymphocytes  Relative 35  12 - 46 %   Lymphs Abs 2.4  0.7 - 4.0 K/uL   Monocytes Relative 13 (*) 3 - 12 %   Monocytes Absolute 0.9  0.1 - 1.0 K/uL   Eosinophils Relative 2  0 - 5 %   Eosinophils Absolute 0.1  0.0 - 0.7 K/uL   Basophils Relative 0  0 - 1 %   Basophils Absolute 0.0  0.0 - 0.1 K/uL  COMPREHENSIVE METABOLIC PANEL      Result Value Ref Range   Sodium 144  137 - 147 mEq/L   Potassium 5.1  3.7 - 5.3 mEq/L   Chloride 103  96 - 112 mEq/L   CO2 31  19 - 32 mEq/L   Glucose, Bld 97  70 - 99 mg/dL   BUN 12  6 - 23 mg/dL   Creatinine, Ser 0.73  0.50 - 1.10 mg/dL   Calcium 9.7  8.4 - 10.5 mg/dL   Total Protein 7.2  6.0 - 8.3 g/dL   Albumin 4.1  3.5 - 5.2 g/dL   AST 35  0 - 37 U/L   ALT 24  0 - 35 U/L   Alkaline Phosphatase 76  39 - 117 U/L   Total Bilirubin 0.9  0.3 - 1.2 mg/dL   GFR calc non Af Amer 80 (*) >90 mL/min   GFR calc Af Amer >90  >90 mL/min   Anion gap 10  5 - 15  TROPONIN I      Result Value Ref Range   Troponin I <0.30  <0.30 ng/mL    Labs Review Labs Reviewed  CBC WITH DIFFERENTIAL - Abnormal; Notable for the following:    RBC 5.25 (*)    Hemoglobin 15.9 (*)    HCT 47.6 (*)    Monocytes Relative 13 (*)    All other components within normal limits  COMPREHENSIVE METABOLIC PANEL - Abnormal; Notable for the following:    GFR calc non Af Amer  80 (*)    All other components within normal limits  TROPONIN I    Imaging Review Dg Chest 2 View  10/24/2013   CLINICAL DATA:  Left chest pain and tightness ; ophthalmologic surgery earlier today.  EXAM: CHEST  2 VIEW  COMPARISON:  PA and lateral chest of January 22, 2012  FINDINGS: The lungs are adequately inflated. There is no focal infiltrate. The heart is top-normal in size. The pulmonary vascularity is not engorged. There is no pleural effusion. The bony thorax is unremarkable. There are degenerative changes of the shoulders.  IMPRESSION: There is no acute cardiopulmonary abnormality.   Electronically Signed   By: David  Martinique   On: 10/24/2013 15:08     EKG Interpretation   Date/Time:  Tuesday October 24 2013 13:14:13 EDT Ventricular Rate:  80 PR Interval:  178 QRS Duration: 80 QT Interval:  374 QTC Calculation: 431 R Axis:   54 Text Interpretation:  Normal sinus rhythm Low voltage QRS Borderline ECG  No significant change was found Confirmed by CAMPOS  MD, KEVIN ([id number]) on  10/24/2013 2:40:45 PM      MDM   Final diagnoses:  None    Medications  sodium chloride 0.9 % bolus 500 mL (not administered)    Filed Vitals:   10/24/13 1430 10/24/13 1513 10/24/13 1514 10/24/13 1533  BP: 155/71 158/62 154/64 155/58  Pulse: 70     Temp:    98.4 F (36.9 C)  TempSrc:      Resp:  18   22  Height:      Weight:      SpO2: 96%   96%   EKG noted normal sinus rhythm with a heart rate of 80 beats per minute-no significant change identified. Troponin negative elevation. CBC negative elevated white blood cell count-negative left shift or leukocytosis noted. Elevated hemoglobin 15.9, hematocrit 47.6. CMP noted balanced electrolytes. BUN 12, creatinine 0.73. Negative elevated AST, ALT, alkaline phosphatase bilirubin. Glucose 97. Chest x-ray negative for acute cardiopulmonary disease. Blood pressure performed bilaterally on both arms, equal bilaterally.  Patient seen and assessed by  attending physician, Dr. Marylene Buerger - who also recommended admission of the patient. Patient refused. Delta troponins to be performed.  Both this provider and attending wanted to admit the patient regarding chest pain after procedure, but patient refused admission. Patient reported that she did not want to be admitted. Patient refused ASA secondary to being told that her physician not recommending thinners within 24 hours after surgery.  Doubt aortic dissection. Doubt PE. Recommended admission for this patient, but patient refused. Patient refused ASA to be administered. Attending spoke with patient and patient continue to refuse admission - as per attending physician, Dr. Marylene Buerger recommended repeat troponin and EKG to be performed and for patient to be re-assessed. Discussed case with Mercedes Camprubi-Soms, PA-C. Transfer of care to [employer], PA-C at change in shift.   Jamse Mead, PA-C 10/24/13 1850

## 2013-10-24 NOTE — ED Provider Notes (Signed)
Care assumed from [facility], [place]. Pt with chest heaviness after blepharoplasty this AM. Work up in the ED reassuring, and pt not wanting observation overnight. Awaiting repeat troponin and EKG at 5PM.  Physical Exam  BP 138/66  Pulse 48  Temp(Src) 98.3 F (36.8 C) (Oral)  Resp 24  Ht 4\' 11"  (1.499 m)  Wt 142 lb (64.411 kg)  BMI 28.67 kg/m2  SpO2 96%  Physical Exam Gen: afebrile, VSS, NAD HEENT: EOMI, MMM. B/L eyelids erythematous with stitches in place Resp: no resp distress CV: rate 80s-90s with irregular rhythm, occasional ectopic atrial beat, no murmurs/rubs/gallops, distal pulses equal bilaterally Abd: appearance normal, nondistended MsK: moving all extremities with ease Neuro: A&O x4  ED Course  Procedures Results for orders placed during the hospital encounter of 10/24/13  CBC WITH DIFFERENTIAL      Result Value Ref Range   WBC 7.0  4.0 - 10.5 K/uL   RBC 5.25 (*) 3.87 - 5.11 MIL/uL   Hemoglobin 15.9 (*) 12.0 - 15.0 g/dL   HCT 47.6 (*) 36.0 - 46.0 %   MCV 90.7  78.0 - 100.0 fL   MCH 30.3  26.0 - 34.0 pg   MCHC 33.4  30.0 - 36.0 g/dL   RDW 12.7  11.5 - 15.5 %   Platelets 234  150 - 400 K/uL   Neutrophils Relative % 50  43 - 77 %   Neutro Abs 3.5  1.7 - 7.7 K/uL   Lymphocytes Relative 35  12 - 46 %   Lymphs Abs 2.4  0.7 - 4.0 K/uL   Monocytes Relative 13 (*) 3 - 12 %   Monocytes Absolute 0.9  0.1 - 1.0 K/uL   Eosinophils Relative 2  0 - 5 %   Eosinophils Absolute 0.1  0.0 - 0.7 K/uL   Basophils Relative 0  0 - 1 %   Basophils Absolute 0.0  0.0 - 0.1 K/uL  COMPREHENSIVE METABOLIC PANEL      Result Value Ref Range   Sodium 144  137 - 147 mEq/L   Potassium 5.1  3.7 - 5.3 mEq/L   Chloride 103  96 - 112 mEq/L   CO2 31  19 - 32 mEq/L   Glucose, Bld 97  70 - 99 mg/dL   BUN 12  6 - 23 mg/dL   Creatinine, Ser 0.73  0.50 - 1.10 mg/dL   Calcium 9.7  8.4 - 10.5 mg/dL   Total Protein 7.2  6.0 - 8.3 g/dL   Albumin 4.1  3.5 - 5.2 g/dL   AST 35  0 - 37 U/L   ALT 24   0 - 35 U/L   Alkaline Phosphatase 76  39 - 117 U/L   Total Bilirubin 0.9  0.3 - 1.2 mg/dL   GFR calc non Af Amer 80 (*) >90 mL/min   GFR calc Af Amer >90  >90 mL/min   Anion gap 10  5 - 15  TROPONIN I      Result Value Ref Range   Troponin I <0.30  <0.30 ng/mL  D-DIMER, QUANTITATIVE      Result Value Ref Range   D-Dimer, Quant 0.46  0.00 - 0.48 ug/mL-FEU  TROPONIN I      Result Value Ref Range   Troponin I <0.30  <0.30 ng/mL    Meds ordered this encounter  Medications  . sodium chloride 0.9 % bolus 500 mL    Sig:   . acetaminophen (TYLENOL) 500 MG tablet  Sig: Take 1,000 mg by mouth every 8 (eight) hours as needed (pain).   Marland Kitchen HYDROcodone-acetaminophen (NORCO/VICODIN) 5-325 MG per tablet    Sig: Take 1 tablet by mouth every 6 (six) hours as needed (pain).   Marland Kitchen acetaminophen (TYLENOL) tablet 650 mg    Sig:      MDM   ICD-9-CM  1. Atypical chest pain 786.59  2. S/P blepharoplasty V45.89    Care assumed from [facility], PA-C. Pt states her symptoms are improved, although her eyes are painful now since she hasn't had meds all day for pain. Tylenol given. All labs reassurring, repeat troponin neg, d-dimer neg. Repeat EKG showing some ectopic atrial beats with irregular rhythm. I observed the monitor and noted that this ectopic beat is infrequent, and likely a normal variant for her. After a long discussion regarding admission, pt called her son and had him talk to me. They feel that given that there is no significant FHx of heart disease, and that her CP has subsided now, she feels better going home and following up with a cardiologist as an outpatient. She denied observation overnight, although I discussed with her that two negative troponins does not rule ACS out with 712% certainty. She understands that she has an irregular rhythm, and that this may be a normal variant or could represent a change, although not a definite reason for admission. Pt given strict return precautions,  and cardiology f/up for further evaluation this week. Pt discharged home in stable and improved condition.  BP 138/66  Pulse 48  Temp(Src) 98.3 F (36.8 C) (Oral)  Resp 24  Ht 4\' 11"  (1.499 m)  Wt 142 lb (64.411 kg)  BMI 28.67 kg/m2  SpO2 96%       [employer], PA-C 10/24/13 1928

## 2013-10-24 NOTE — ED Provider Notes (Signed)
Medical screening examination/treatment/procedure(s) were performed by non-physician practitioner and as supervising physician I was immediately available for consultation/collaboration.   EKG Interpretation   Date/Time:  Tuesday October 24 2013 17:06:53 EDT Ventricular Rate:  90 PR Interval:  197 QRS Duration: 82 QT Interval:  392 QTC Calculation: 480 R Axis:   56 Text Interpretation:  Unknown rhythm, irregular rate Low voltage,  precordial leads Since last tracing of earlier today No significant change  was found Confirmed by [place]  MD-I, Dayjah Selman ([id number]) on 10/24/2013 5:15:47 PM      Rolland Porter, MD, Alanson Aly, MD 10/24/13 [phone]

## 2013-10-24 NOTE — ED Notes (Signed)
Pt reports having eye surgery this am, had episode of chest heaviness while in recovery, pain has since resolved. Sent here for sinus arrhythmia on ekg.

## 2013-10-26 ENCOUNTER — Encounter: Payer: Self-pay | Admitting: Cardiology

## 2013-10-26 ENCOUNTER — Ambulatory Visit (INDEPENDENT_AMBULATORY_CARE_PROVIDER_SITE_OTHER): Payer: Medicare Other | Admitting: Cardiology

## 2013-10-26 ENCOUNTER — Telehealth: Payer: Self-pay | Admitting: Cardiology

## 2013-10-26 VITALS — BP 140/56 | HR 82 | Ht 59.0 in | Wt 146.0 lb

## 2013-10-26 DIAGNOSIS — R072 Precordial pain: Secondary | ICD-10-CM | POA: Insufficient documentation

## 2013-10-26 DIAGNOSIS — R079 Chest pain, unspecified: Secondary | ICD-10-CM

## 2013-10-26 NOTE — Telephone Encounter (Signed)
Pt had eye surgery this past  Tuesday; while having the surgery pt C/O of tightness in chest. Pt was advised to go to the ER; the ER perform different  tests and wanted to keep  pt  overnight; pt refused. Pt  Stated  that she wanted to be seen by Dr. Radford Pax in the office because she  was her husband's doctor. Pt has not been seen in this office before. Pt  was made aware that she is a new pt, and will need to be referred to here, because this is a Editor, commissioning. Pt states that she was referred to  see a cardiology, but she wants to see her husband's cardiologist  Pt is demanding to be seen today by Dr. Radford Pax today. Pt is aware that I will let Dr Radford Pax MD know.

## 2013-10-26 NOTE — Patient Instructions (Addendum)
We are scheduling a POET (Plain old treadmill test in one month)  Your physician recommends that you schedule a follow-up appointment in: 6 months with an extender

## 2013-10-26 NOTE — Telephone Encounter (Signed)
New problem:           Pt called to report she had eye surg. 2 days ago pt was feeling a little tighness of the chest.   Pt was told to see her Card with in two days.  Pt had some chest around 2 am this morning.   Pt called to see if she can be seen today.

## 2013-10-26 NOTE — Telephone Encounter (Signed)
TO Dr Radford Pax as a Alexis Hines

## 2013-10-26 NOTE — Progress Notes (Signed)
HPI The patient has no prior cardiac history. She was in the hospital emergency room on the 28th with chest discomfort. She had had a blepharoplasty as an outpatient and when she woke she was having some mild discomfort in her mid chest that radiated across the right side to her back. Because of this she was transported to the emergency room. She did not want to be hospitalized overnight. Enzymes were negative. EKG was nonspecific with some mild T-wave inversions in premature atrial contractions.  However, her pain had gone away by then. She has not had any symptoms prior to this and none since then. She denies chest pressure, neck or arm discomfort. She has no palpitations, presyncope or syncope. She's had no PND or orthopnea. She exercises 3 times per week and does yard work.  No Known Allergies  Current Outpatient Prescriptions  Medication Sig Dispense Refill  . acetaminophen (TYLENOL) 500 MG tablet Take 1,000 mg by mouth every 8 (eight) hours as needed (pain).       . Calcium Carbonate-Vitamin D (CALTRATE 600+D) 600-400 MG-UNIT per tablet Take 1 tablet by mouth daily.        Marland Kitchen HYDROcodone-acetaminophen (NORCO/VICODIN) 5-325 MG per tablet Take 1 tablet by mouth every 6 (six) hours as needed (pain).       . Multiple Vitamins-Minerals (CENTRUM PO) Take 1 tablet by mouth daily.        Marland Kitchen neomycin-polymyxin b-dexamethasone (MAXITROL) 3.5-10000-0.1 OINT Use on surgical incisions 4 times a day for 10 days      . rosuvastatin (CRESTOR) 20 MG tablet Take 1 tablet (20 mg total) by mouth daily.  28 tablet  0   No current facility-administered medications for this visit.    Past Medical History  Diagnosis Date  . Pure hypercholesterolemia   . Basal cell cancer     nose; Dr. Allyson Sabal   . Abnormal TSH 2004    normal since  . Diverticulosis 8/07  . Migraine childhood    resolved    Past Surgical History  Procedure Laterality Date  . Total knee arthroplasty Left 07/2003    Dr. Theda Sers  . Breast  biopsy Left 1984    benign  . Tonsillectomy  age 20    Family History  Problem Relation Age of Onset  . Cancer Sister     bone cancer L leg with mets to lung, diagnosed age 78; s/p amputation  . Hyperlipidemia Brother   . Cancer Sister 31    breast cancer  . Breast cancer Sister 74    History   Social History  . Marital Status: Widowed    Spouse Name: N/A    Number of Children: 2  . Years of Education: N/A   Occupational History  . retired Psychologist, prison and probation services    Social History Main Topics  . Smoking status: Never Smoker   . Smokeless tobacco: Never Used  . Alcohol Use: Yes     Comment: occasional glass of wine.  . Drug Use: No  . Sexual Activity: Not on file   Other Topics Concern  . Not on file   Social History Narrative   Widowed. Lives 1/2 the year in [place]. Her husband (retired Tax adviser) died from liver cancer in 04-15-2011.  Daughter lives in [place]; 2 grandchildren here, and 2 in [place]    ROS:  Positive for constipation.  Otherwise as stated in the HPI and negative for all other systems.   PHYSICAL EXAM BP 140/56  Pulse 82  Ht  4\' 11"  (1.499 m)  Wt 146 lb (66.225 kg)  BMI 29.47 kg/m2 GENERAL:  Well appearing HEENT:  Pupils equal round and reactive, fundi not visualized, oral mucosa unremarkable, bruising around her eyes NECK:  No jugular venous distention, waveform within normal limits, carotid upstroke brisk and symmetric, no bruits, no thyromegaly LYMPHATICS:  No cervical, inguinal adenopathy LUNGS:  Clear to auscultation bilaterally BACK:  No CVA tenderness CHEST:  Unremarkable HEART:  PMI not displaced or sustained,S1 and S2 within normal limits, no S3, no S4, no clicks, no rubs, no murmurs ABD:  Flat, positive bowel sounds normal in frequency in pitch, no bruits, no rebound, no guarding, no midline pulsatile mass, no hepatomegaly, no splenomegaly EXT:  2 plus pulses throughout, no edema, no cyanosis no clubbing SKIN:  No rashes no nodules NEURO:   Cranial nerves II through XII grossly intact, motor grossly intact throughout PSYCH:  Cognitively intact, oriented to person place and time   EKG:   NSR rate 90, axis within normal limits, intervals within normal limits, no acute ST-T wave changes, nonspecific inferior and lateral T-wave flattening. Premature atrial contractions.10/24/13  ASSESSMENT AND PLAN  CHEST PAIN:  The pretest probability of obstructive coronary disease is low. I will bring the patient back for a POET (Plain Old Exercise Test). This will allow me to screen for obstructive coronary disease, risk stratify and very importantly provide a prescription for exercise.  PACs:  She's not noticing these. No further workup would be suggested.  HTN:  The blood pressure is slightly  high. I have instructed the patient to record a blood pressure diary and recording this. This will be presented for my review and pending these results I will make further suggestions about changes in therapy for optimal blood pressure control.

## 2013-10-26 NOTE — Telephone Encounter (Signed)
Dr. Radford Pax is aware of pt's request. Md states that she will be off for the next 2 weeks she needs to make an appointment with any cardiologist and she she wants to be change to DR Radford Pax after that she can. Pt is  is asking if she can be see today or tomorrow by a cardiologist. Pt was made aware that we may not have any openings this week and if her symptoms return she will need to go to the ER. Pt states " I don't need to go to the ER". Message send to [facility] for appointment.

## 2013-10-26 NOTE — Telephone Encounter (Signed)
Pt has an appointment with Dr. Percival Spanish in the [place] line office at 3:30 PM today. Pt is aware to arrive 30 to 15 minutes before the appointment time. Pt verbalized understanding.

## 2013-10-30 NOTE — ED Provider Notes (Signed)
Medical screening examination/treatment/procedure(s) were conducted as a shared visit with non-physician practitioner(s) and myself.  I personally evaluated the patient during the encounter.   EKG Interpretation   Date/Time:  Tuesday October 24 2013 17:06:53 EDT Ventricular Rate:  90 PR Interval:  197 QRS Duration: 82 QT Interval:  392 QTC Calculation: 480 R Axis:   56 Text Interpretation:  Unknown rhythm, irregular rate Low voltage,  precordial leads Since last tracing of earlier today No significant change  was found Confirmed by KNAPP  MD-I, IVA ([id number]) on 10/24/2013 5:15:47 PM      Transient CP, ecg x 2 and troponin x 2 negative. Exercises frequently without CP or SOB. Dc home with outpatient pcp and cardiology follow up  Hoy Morn, MD 10/30/13 [phone]

## 2013-11-22 ENCOUNTER — Ambulatory Visit (INDEPENDENT_AMBULATORY_CARE_PROVIDER_SITE_OTHER): Payer: Medicare Other | Admitting: Family Medicine

## 2013-11-22 ENCOUNTER — Encounter: Payer: Self-pay | Admitting: Family Medicine

## 2013-11-22 VITALS — BP 118/68 | HR 80 | Temp 98.7°F | Ht 59.0 in | Wt 146.0 lb

## 2013-11-22 DIAGNOSIS — Z79899 Other long term (current) drug therapy: Secondary | ICD-10-CM

## 2013-11-22 DIAGNOSIS — R059 Cough, unspecified: Secondary | ICD-10-CM | POA: Diagnosis not present

## 2013-11-22 DIAGNOSIS — J309 Allergic rhinitis, unspecified: Secondary | ICD-10-CM

## 2013-11-22 DIAGNOSIS — R05 Cough: Secondary | ICD-10-CM | POA: Diagnosis not present

## 2013-11-22 DIAGNOSIS — E7800 Pure hypercholesterolemia, unspecified: Secondary | ICD-10-CM | POA: Diagnosis not present

## 2013-11-22 NOTE — Progress Notes (Signed)
Chief Complaint  Patient presents with  . Cough    started 11/13/13 and has worsened and her chest is hurting her. She was able to exercise this morning. Unsure of color of mucus. Doesn't think she has had any fevers, but has not checked either. Brought in records from [facility] with her today(I made copies). She is also asking if you will put in orders for her to come back fasting to have cholesterol checked soon .    She started coughing on 8/17. She had been in [place] and [place] when this started. She is having a runny nose, mainly from the right nostril only.  Yesterday she started coughing up phlegm, small amount, she didn't look at the color. She is having postnasal drainage.  Denies sniffling, sneezing.  She had eye surgery the end of July; told she has some dry eyes.  Denies itchy eyes.  She has some mild mid-back discomfort when she takes a deep breath, described as a slight soreness  Denies fevers, chills.  Denies wheezing or shortness of breath.  She has used Tussin DM a few times--only once or twice a day.  It tends to give her a little headache.  She used ibuprofen/phenylephrine last night with good results.  She had some chest tightness after her eye surgery in July.  She followed up with the cardiologist.  Stress test was recommended, and is scheduled for next month.  She is hesitant to do this.  She saw her regular physician in [place] in March, and brings in copies of her labs/CXR as part of her routine visit (she lives there 1/2 the year).  She is asking to have her cholesterol repeated, and also asking if she can cut the dose of her Crestor.  CXR done 06/08/13 which was normal. Lipids 06/08/13:  Total 200, HDL 74, TG 121, LDL 102, chol/HDL ratio 2.7 c-met, CBC, TSH, u/a normal  Past Medical History  Diagnosis Date  . Pure hypercholesterolemia   . Basal cell cancer     nose; Dr. Allyson Sabal   . Abnormal TSH 2004    normal since  . Diverticulosis 8/07  . Migraine childhood    resolved    Past Surgical History  Procedure Laterality Date  . Total knee arthroplasty Left 07/2003    Dr. Theda Sers  . Breast biopsy Left 1984    benign  . Tonsillectomy  age 65   History   Social History  . Marital Status: Widowed    Spouse Name: N/A    Number of Children: 2  . Years of Education: N/A   Occupational History  . Retired Psychologist, prison and probation services    Social History Main Topics  . Smoking status: Never Smoker   . Smokeless tobacco: Never Used  . Alcohol Use: Yes     Comment: occasional glass of wine.  . Drug Use: No  . Sexual Activity: Not on file   Other Topics Concern  . Not on file   Social History Narrative   Widowed. Lives 1/2 the year in [place]. Her husband (retired Tax adviser) died from liver cancer in Apr 25, 2011.  Daughter lives in [place]; 2 grandchildren here, and 2 in [place]    Outpatient Encounter Prescriptions as of 11/22/2013  Medication Sig Note  . Calcium Carbonate-Vitamin D (CALTRATE 600+D) 600-400 MG-UNIT per tablet Take 1 tablet by mouth daily.     Marland Kitchen dextromethorphan-guaiFENesin (ROBITUSSIN-DM) 10-100 MG/5ML liquid Take 10 mLs by mouth every 4 (four) hours as needed for cough. 11/22/2013: Using sporadically, just  1-2x/day   . Multiple Vitamins-Minerals (CENTRUM PO) Take 1 tablet by mouth daily.     . rosuvastatin (CRESTOR) 20 MG tablet Take 1 tablet (20 mg total) by mouth daily.   Marland Kitchen Phenylephrine-Ibuprofen (ADVIL CONGESTION RELIEF PO) Take 1 tablet by mouth as needed. 11/22/2013: She took last night, using prn  . [DISCONTINUED] acetaminophen (TYLENOL) 500 MG tablet Take 1,000 mg by mouth every 8 (eight) hours as needed (pain).  10/24/2013: .  Marland Kitchen [DISCONTINUED] dextromethorphan 15 MG/5ML syrup Take 10 mLs by mouth 4 (four) times daily as needed for cough.   . [DISCONTINUED] HYDROcodone-acetaminophen (NORCO/VICODIN) 5-325 MG per tablet Take 1 tablet by mouth every 6 (six) hours as needed (pain).  10/24/2013: .  Marland Kitchen [DISCONTINUED] neomycin-polymyxin b-dexamethasone  (MAXITROL) 3.5-10000-0.1 OINT Use on surgical incisions 4 times a day for 10 days 10/26/2013: Received from: [facility]   No Known Allergies  ROS:  No fevers, chills, headaches, dizziness.  No further problems with chest pain, shortness of breath. +cough and runny nose as per HPI.  No nausea, vomiting, diarrhea, urinary complaints, edema, bleeding, bruising, rash or other concerns.  No myalgias or side effects of meds.  PHYSICAL EXAM: BP 118/68  Pulse 80  Temp(Src) 98.7 F (37.1 C) (Tympanic)  Ht _0  (1.499 m)  Wt 146 lb (66.225 kg)  BMI 29.47 kg/m2  Well developed, pleasant, talkative female in no distress. She is speaking easily in full sentences.  Frequent throat clearing, no cough HEENT:  PERRL, EOMI, conjunctiva clear.  TM's and EAC's normal. Nasal mucosa is mild-moderately edematous, with clear-white mucus R>L. OP is clear. Sinuses nontender Neck: no lymphadenopathy or mass Heart: regular rate and rhythm without murmur.  Occasional ectopic beats Lungs: clear bilaterally.  No wheezes, rales, ronchi.  Clear over the area of her discomfort, and nontender to palpation Skin: no rashes  ASSESSMENT/PLAN:  Cough - no evidence of bacterial infection.  Suspect due to PND from allergies (vs URI)  Pure hypercholesterolemia - Plan: Lipid panel  Encounter for long-term (current) use of other medications - Plan: Lipid panel, Hepatic function panel  Allergic rhinitis, cause unspecified - restart claritin, Flonase   Drink plenty of fluids. Restart claritin (or zyrtec) every day for possible alleriges. Consider restarting Flonase (nasal steroid spray) with daily use. Consider using sinus rinses (to help clear the nose and sinuses, before the mucus drains into the throat and chest). Continue to use the advil congestion medication, if needed for sinus headache and to further dry up any congestion (only if needed). Okay to continue guaifenesin (expectorant to loosen  phlegm) and/or dextromethorphan (cough suppressant), as needed for cough.  Return or call if worsening cough, persistent discolored mucus.  Return if high fevers, shortness of breath   Return Mid-September for LFT's and lipids; give high dose flu shot at that time. She was asking about lowering dose--encouraged to continue her current dose, at goal without side effects.

## 2013-11-22 NOTE — Patient Instructions (Signed)
  Drink plenty of fluids. Restart claritin (or zyrtec) every day for possible alleriges. Consider restarting Flonase (nasal steroid spray) with daily use. Consider using sinus rinses (to help clear the nose and sinuses, before the mucus drains into the throat and chest). Continue to use the advil congestion medication, if needed for sinus headache and to further dry up any congestion (only if needed). Okay to continue guaifenesin (expectorant to loosen phlegm) and/or dextromethorphan (cough suppressant), as needed for cough.  Return or call if worsening cough, persistent discolored mucus.  Return if high fevers, shortness of breath

## 2013-11-29 ENCOUNTER — Encounter: Payer: Self-pay | Admitting: Family Medicine

## 2013-11-30 ENCOUNTER — Encounter (HOSPITAL_COMMUNITY): Payer: Medicare Other

## 2013-12-26 ENCOUNTER — Other Ambulatory Visit (INDEPENDENT_AMBULATORY_CARE_PROVIDER_SITE_OTHER): Payer: Medicare Other

## 2013-12-26 DIAGNOSIS — Z23 Encounter for immunization: Secondary | ICD-10-CM

## 2013-12-26 DIAGNOSIS — E7800 Pure hypercholesterolemia, unspecified: Secondary | ICD-10-CM

## 2013-12-26 DIAGNOSIS — Z79899 Other long term (current) drug therapy: Secondary | ICD-10-CM

## 2013-12-26 LAB — HEPATIC FUNCTION PANEL
ALBUMIN: 4.1 g/dL (ref 3.5–5.2)
ALT: 17 U/L (ref 0–35)
AST: 27 U/L (ref 0–37)
Alkaline Phosphatase: 64 U/L (ref 39–117)
BILIRUBIN TOTAL: 0.9 mg/dL (ref 0.2–1.2)
Bilirubin, Direct: 0.1 mg/dL (ref 0.0–0.3)
Indirect Bilirubin: 0.8 mg/dL (ref 0.2–1.2)
Total Protein: 6.6 g/dL (ref 6.0–8.3)

## 2013-12-26 LAB — LIPID PANEL
CHOL/HDL RATIO: 2.6 ratio
Cholesterol: 165 mg/dL (ref 0–200)
HDL: 63 mg/dL (ref >39)
LDL Cholesterol: 79 mg/dL (ref 0–99)
Triglycerides: 117 mg/dL (ref <150)
VLDL: 23 mg/dL (ref 0–40)

## 2013-12-28 ENCOUNTER — Telehealth: Payer: Self-pay | Admitting: *Deleted

## 2013-12-28 NOTE — Telephone Encounter (Signed)
Yes.  Labs were normal.  Continue current meds (okay to refill x 6 months, if needed)

## 2013-12-28 NOTE — Telephone Encounter (Signed)
Patient called requesting lab results and wants to know if she will continue on her current dose of Crestor.

## 2013-12-28 NOTE — Telephone Encounter (Signed)
Spoke with patient and went over results with her. She did not need any refills at the moment, but will call if she does.

## 2014-01-29 ENCOUNTER — Encounter: Payer: Self-pay | Admitting: Family Medicine

## 2014-02-26 ENCOUNTER — Ambulatory Visit (INDEPENDENT_AMBULATORY_CARE_PROVIDER_SITE_OTHER): Payer: Medicare Other | Admitting: Family Medicine

## 2014-02-26 ENCOUNTER — Encounter: Payer: Self-pay | Admitting: Family Medicine

## 2014-02-26 VITALS — BP 130/72 | HR 80 | Temp 98.5°F | Ht 59.0 in | Wt 142.0 lb

## 2014-02-26 DIAGNOSIS — J019 Acute sinusitis, unspecified: Secondary | ICD-10-CM

## 2014-02-26 MED ORDER — AZITHROMYCIN 250 MG PO TABS: ORAL_TABLET | ORAL | Status: DC

## 2014-02-26 NOTE — Progress Notes (Signed)
Chief Complaint  Patient presents with  . Cough    x 1 month-is taking Tussin DM but her mucus is yellow and she would like you to listen to her chest.   . Rash    so has one spot on her right thigh that she is concerned about-been there for about 2 weeks and itches.     She has had a cough for 2-3 weeks (at least), and is finally improving.  It is much better, but she is still sneezing, and still bringing up phlegm. Phlegm is yellow first thing in the morning, and sometimes she gets up yellow phlegm during the day. She has some runny nose and sneezing; no sinus pain. She had been using Tussin DM three times daily, hasn't taken it in about a week, and not any worse since stopping it.  Denies any known fevers or chills.   She has h/o BCC on her nose, so aware of her skin.  She has had an itchy spot on her right lateral thigh for about 2 weeks.  She has been scratching at it.  Hasn't noticed any change in size, bleeding.  There was slight drainage, and is now scabbed.  PMH, PSH, SH reviewed.  No changes to [place]  Outpatient Encounter Prescriptions as of 02/26/2014  Medication Sig Note  . Calcium Carbonate-Vitamin D (CALTRATE 600+D) 600-400 MG-UNIT per tablet Take 1 tablet by mouth daily.     . Multiple Vitamins-Minerals (CENTRUM PO) Take 1 tablet by mouth daily.     . Polyvinyl Alcohol-Povidone (REFRESH OP) Apply 1 drop to eye 2 (two) times daily.   . rosuvastatin (CRESTOR) 20 MG tablet Take 1 tablet (20 mg total) by mouth daily.   Marland Kitchen azithromycin (ZITHROMAX) 250 MG tablet Take 2 tablets by mouth on first day, then 1 tablet by mouth on days 2 through 5   . dextromethorphan-guaiFENesin (ROBITUSSIN-DM) 10-100 MG/5ML liquid Take 10 mLs by mouth every 4 (four) hours as needed for cough. 02/26/2014: Was using TID prn, hasn't used in a week  . [DISCONTINUED] Phenylephrine-Ibuprofen (ADVIL CONGESTION RELIEF PO) Take 1 tablet by mouth as needed. 11/22/2013: She took last night, using prn   (z-pak prescribed  today, not prior to visit)  No Known Allergies   ROS:  No fevers, chills, sinus pain, headache, dizziness, chest pain, shortness of breath, nausea, vomiting, bowel changes, bleeding, bruising, rash (other than scabbed lesion on thigh).  No depression, anxiety or other complaints.  See HPI.  PHYSICAL EXAM: BP 130/72 mmHg  Pulse 80  Temp(Src) 98.5 F (36.9 C) (Tympanic)  Ht 4\' 11"  (1.499 m)  Wt 142 lb (64.411 kg)  BMI 28.67 kg/m2 Well developed, pleasant female, somewhat hard of hearing, in no distress (not wearing her hearing aids today). HEENT:  Right TM is obscured by cerumen.  L TM and EAC is normal.  Nasal mucosa is moderately edematous, with +erythema and some purulence noted on the left.  OP is clear. Sinuses are nontender Neck: no lymphadenopathy or mass Heart: overall regular rhythm, but frequent ectopy (PVC's vs PAC's, sometimes every other beat, but longer stretches where I can tell that rhythm is regular) Lungs: clear bilaterally, with good air movement Skin: Right distal lateral thigh:  There is a 28mm scab on top of a 3-67mm area of slightly pink skin.  There is no induration, warmth, streaks, swelling.  Looks like a simple scab.  ASSESSMENT/PLAN: Acute sinusitis, recurrence not specified, unspecified location - Plan: azithromycin (ZITHROMAX) 250 MG tablet  Take the antibiotics as directed.  I recommend using mucinex (guaifenesin) only as needed, if having thick mucus.  Consider taking an antihistamine such as claritin to help with sneezing and runny nose.  Use an antibacterial ointment such as neosporin or bacitracin 2-3 times daily to the scab on your right thigh, and keep the area covered with a bandage. Use cortaid (hydrocortisone cream) only as needed if having a lot of itching at the scab (to prevent further injury or scratching).

## 2014-02-26 NOTE — Patient Instructions (Signed)
  Take the antibiotics as directed.  I recommend using mucinex (guaifenesin) only as needed, if having thick mucus.  Consider taking an antihistamine such as claritin to help with sneezing and runny nose.  Use an antibacterial ointment such as neosporin or bacitracin 2-3 times daily to the scab on your right thigh, and keep the area covered with a bandage. Use cortaid (hydrocortisone cream) only as needed if having a lot of itching at the scab (to prevent further injury or scratching).

## 2014-03-13 HISTORY — PX: OTHER SURGICAL HISTORY: SHX169

## 2014-03-19 ENCOUNTER — Encounter: Payer: Self-pay | Admitting: Family Medicine

## 2014-03-19 ENCOUNTER — Ambulatory Visit (INDEPENDENT_AMBULATORY_CARE_PROVIDER_SITE_OTHER): Payer: Medicare Other | Admitting: Family Medicine

## 2014-03-19 VITALS — BP 120/74 | HR 72 | Temp 97.7°F | Ht 60.0 in | Wt 140.0 lb

## 2014-03-19 DIAGNOSIS — R059 Cough, unspecified: Secondary | ICD-10-CM

## 2014-03-19 DIAGNOSIS — R05 Cough: Secondary | ICD-10-CM

## 2014-03-19 DIAGNOSIS — H6121 Impacted cerumen, right ear: Secondary | ICD-10-CM

## 2014-03-19 DIAGNOSIS — L821 Other seborrheic keratosis: Secondary | ICD-10-CM

## 2014-03-19 DIAGNOSIS — J069 Acute upper respiratory infection, unspecified: Secondary | ICD-10-CM

## 2014-03-19 MED ORDER — AMOXICILLIN-POT CLAVULANATE 875-125 MG PO TABS: 1.0000 | ORAL_TABLET | Freq: Two times a day (BID) | ORAL | Status: DC

## 2014-03-19 NOTE — Progress Notes (Signed)
Chief Complaint  Patient presents with  . phelgm    congestion, cough with phelgm sneezing, taking over the counter meds to help  . ear blocked    wants to have her right ear checked  . spot on leg    she has a spot on leg that she wants you to look at, if you had time   She was seen 11/30 and treated for sinusitis with azithromycin. She reports that "Everything got better" (cough), but phlegm never completely resolved.  It was whitish, perhaps a little yellow.  5 days ago she had increasing head congestion, and now starting to have chest congestion.  Over the weekend (2 days ago) she started having a lot of coughing and sneezing. 3 days ago her right ear felt blocked/plugged.  It seems a little better today. She denies fevers, chills.  No sick contacts.  She has been using advil for congestion/cold and Tussar cough medication (doesn't recall ingredient, thinks it is like Delsym)  Lesion on right leg was looked at previously, but had scab on it.  The scab is gone and she thinks it is healing.  PMH, [place], SH reviewed  Outpatient Encounter Prescriptions as of 03/19/2014  Medication Sig Note  . Calcium Carbonate-Vitamin D (CALTRATE 600+D) 600-400 MG-UNIT per tablet Take 1 tablet by mouth daily.     . Multiple Vitamins-Minerals (CENTRUM PO) Take 1 tablet by mouth daily.     . Polyvinyl Alcohol-Povidone (REFRESH OP) Apply 1 drop to eye 2 (two) times daily.   . rosuvastatin (CRESTOR) 20 MG tablet Take 1 tablet (20 mg total) by mouth daily.   Marland Kitchen dextromethorphan-guaiFENesin (ROBITUSSIN-DM) 10-100 MG/5ML liquid Take 10 mLs by mouth every 4 (four) hours as needed for cough. 03/19/2014: Using Tussar (?if is the same), vs just dextromethorphan   . [DISCONTINUED] azithromycin (ZITHROMAX) 250 MG tablet Take 2 tablets by mouth on first day, then 1 tablet by mouth on days 2 through 5 (Patient not taking: Reported on 03/19/2014)    No Known Allergies   ROS:  Denies nausea, vomiting, abdominal pain.  She  had loose bowels yesterday, no blood in stool. No fever, chills, chest pain, shortness of breath, headache, dizziness or other complaints except as per HPI.   PHYSICAL EXAM: BP 120/74 mmHg  Pulse 72  Temp(Src) 97.7 F (36.5 C) (Oral)  Ht 5' (1.524 m)  Wt 140 lb (63.504 kg)  BMI 27.34 kg/m2 Well developed female in no distress.  Occasional cough HEENT: PERRL, EOMI, conjunctiva clear.  TM and EAC is normal on the left.  Deep cerumen is obscuring right TM, remainder of EAC is normal. Unable to get at the cerumen with curette. Nasal mucosa is moderately edematous, not erythematous.  There is clear mucus bilaterally, with some yellow crusting distally on the left.  OP is clear Neck: no lymphadenopathy, thyromegaly or mass Heart: regular rate and rhythm without murmur Lungs: clear bilaterally Skin: raised, light brown, slightly dry lesion on the right lateral thigh--appears consistent with SK.  ASSESSMENT/PLAN:  Acute upper respiratory infection - Plan: amoxicillin-clavulanate (AUGMENTIN) 875-125 MG per tablet  Cough  Cerumen impaction, right  Seborrheic keratosis - right lateral leg.  URI--appears consistent with viral URI, rather than bacterial sinusitis at this time.  Start antibiotics next week if symptoms persist/worsen. Continue decongestants, and add guaifenesin.  She declined ear lavage (had a problem in the past)--advised to use debrox drops and to f/u for lavage if not improving.  Cerumen impaction on the right.

## 2014-03-19 NOTE — Patient Instructions (Addendum)
Drink plenty of fluids. Continue the decongestants to help dry up the nasal drainage. I recommend using guaifenesin to help keep the mucus thin--check the cough syrup that you have to see if it contains guaifenesin (and if not, you can change to Robitussin DM which has bough cough suppressant (DM) and the expectorant, vs adding plain Mucinex to the dextromethorphan)--the two ingredients that will help you with your cough are guaifenesin (expectorant) and dextromethorphan (cough suppressant).  Start the antibiotics next week if your symptoms are clearly getting worse, rather than improving (thick, discolored mucus, and/or sinus pain or worsening cough).  I believe the spot on your right leg is a seborrheic keratosis.  This is benign and can be rechecked at another visit.  Get it checked sooner if bleeding, increasing in size, changing in color, or any other concerns develop.

## 2014-03-21 ENCOUNTER — Encounter: Payer: Self-pay | Admitting: Internal Medicine

## 2014-07-09 LAB — HM MAMMOGRAPHY: HM Mammogram: NORMAL

## 2014-11-22 ENCOUNTER — Telehealth: Payer: Self-pay | Admitting: *Deleted

## 2014-11-22 NOTE — Telephone Encounter (Signed)
Spoke with patient and she will be leaving to drive to [place] Monday, then traveling to [place] right after Labor Day. She will do the diary free diet and get some probiotics and call first thing Monday morning if symptoms do not subside.

## 2014-11-22 NOTE — Telephone Encounter (Signed)
Patient called and is in [place]. Has had diarrhea x 5 days so she went to ER. Seems to have checked out fine but they told her to have her PCP order some stool studies. She wants to know if you can order stool studies and fax rx to [facility] @ [phone]. She can be reached @ [phone] at her brother's house.

## 2014-11-22 NOTE — Telephone Encounter (Signed)
Error

## 2014-11-22 NOTE — Telephone Encounter (Signed)
If they needed the stool studies right away, the ER could have given the orders. I'm sure they said to follow up with PCP, and that if diarrhea persists, they might do stool studies.  How long will she be in [place]? Needs to be on a dairy-free diet and taking probiotics daily.  If her diarrhea has persisted at least 5 days after doing these things, then needs add'l eval with stool studies.  Since I don't have records or history about the details of her diarrhea, I'm not sure exactly which tests are needed (just culture, ova and parasite, vs needing c.diff if any recent antibiotics, etc).  This is something that normally would be done at a follow-up visit, not handled like this.

## 2015-01-16 ENCOUNTER — Ambulatory Visit (INDEPENDENT_AMBULATORY_CARE_PROVIDER_SITE_OTHER): Payer: Medicare Other | Admitting: Family Medicine

## 2015-01-16 ENCOUNTER — Encounter: Payer: Self-pay | Admitting: Family Medicine

## 2015-01-16 VITALS — BP 128/78 | HR 80 | Ht 60.0 in | Wt 145.0 lb

## 2015-01-16 DIAGNOSIS — Z23 Encounter for immunization: Secondary | ICD-10-CM

## 2015-01-16 DIAGNOSIS — G4700 Insomnia, unspecified: Secondary | ICD-10-CM | POA: Diagnosis not present

## 2015-01-16 DIAGNOSIS — E7800 Pure hypercholesterolemia, unspecified: Secondary | ICD-10-CM | POA: Diagnosis not present

## 2015-01-16 NOTE — Patient Instructions (Signed)
If you can afford it, I think [facility] Screening is a good idea to do just once (specifically for the aneurysm screening and carotid screening, since your insurance will not cover for me to test these.  I don't recommend regular use of Advil PM--not because it causes Alzheimers.  The advil shouldn't be used regularly, just as needed for headache (along with food, so it doesn't bother your stomach).  The PM is diphenhydramine, which is what is in Benadryl, and is considered "risky" in elderly.  It can cause confusion, and possibly increased risks for falls with injury. Instead of Advil PM, try taking Melatonin at bedtime.  Other relaxation techniques can also work--medication, soft music, relaxing, deep breathing exercises.  If these aren't effective, then you can try just the diphenhydramine alone--found in Benadryl, or Simply Sleep.  This can be used as needed, doesn't need to be taken nightly (and preferably not used regularly).   3D mammograms are recommended, and should be covered by your insurance.  You may already be getting these--the letter you brought just said the mammogram was normal, not which type they did.  I feel that you can have your cholesterol and liver tests done just once a year--they have all been normal, and your lipids have been at goal. If for some reason your liver tests are up when you have it checked next time in [place], we absolutely should make sure it gets repeated.  At this point, they were checked in March and August (in ER), and do not need to be repeated.  Your cholesterol has been at goal for many years, and only needs to be checked once yearly.  Please double check with your doctor in [place]--I believe that the pneumonia shot that you had in March of 2013 was another pneumovax (you had one in 2004 also).  If it was NOT, and if it was actually Prevnar-13, then you are up to date. If it was truly a second pneumovax, then you have never had a Prevnar-13 vaccine, and I  recommend that you return to the office for a nurse visit to get Prevnar.  Please schedule an annual exam with your gynecologist for when you are in [place]. You should be having yearly breast and pelvic exams (pap smears are no longer needed).

## 2015-01-16 NOTE — Progress Notes (Signed)
Chief Complaint  Patient presents with  . Advice Only    would like cholesterol check, not fasting today. Did bring chol test from March with her. Would like breast exam today. Following up on ER visit from Aug while in [place].  Marland Kitchen Anxiety    feels like she "worries alot." Over the last year has worsened. Has some sleep isues periodically-can't stay asleep. Is Advil PM safe to take-read article about PM part causin Alzheimer's.   She is asking about [facility] screening, wondering if she needs, specifically the carotid artery screen. She denies any neuro sx or h/o carotid artery study in the past.  She would like to discuss ER visit from August.  Records were received and reviewed. Diarrhea lasted about 6 days total. She went to ER after the 4th day, due to worries about possible Norovirus (and upcoming travel to [place] shortly after).  She took probiotic, along with gatorade, pedialyte and bland diet and symptoms resolved.  Anxiety/insomnia:  She has taken Advil PM to help with her sleep (has trouble related to her anxiety).  She read an article stating that the PM portion might cause Alzheimers, so she threw them out. Sometimes she gets headaches which prevent her from sleeping, and the Advil helps. Tylenol has never been effective for her headache. She is asking about what she can take to help her sleep.  Hyperlipidemia follow-up:  Patient is reportedly following a low-fat, low cholesterol diet.  Compliant with medications and denies medication side effects She had labs done in [place] in March--normal c-met, glu 85, normal LFT's. Chol 180, TG 99, HDL 76, LDL 84.  We have monitored LFT's and lipids every 6 months (in September, has done in [facility] in March), and has been at goal. She denies any changes in diet.  PMH, PSH, SH reviewed.  Outpatient Encounter Prescriptions as of 01/16/2015  Medication Sig Note  . Calcium Carbonate-Vitamin D (CALTRATE 600+D) 600-400 MG-UNIT per tablet Take 1 tablet by mouth daily.      . Multiple Vitamins-Minerals (CENTRUM PO) Take 1 tablet by mouth daily.     . rosuvastatin (CRESTOR) 20 MG tablet Take 1 tablet (20 mg total) by mouth daily.   . [DISCONTINUED] amoxicillin-clavulanate (AUGMENTIN) 875-125 MG per tablet Take 1 tablet by mouth 2 (two) times daily.   . [DISCONTINUED] dextromethorphan-guaiFENesin (ROBITUSSIN-DM) 10-100 MG/5ML liquid Take 10 mLs by mouth every 4 (four) hours as needed for cough. 03/19/2014: Using Tussar (?if is the same), vs just dextromethorphan   . [DISCONTINUED] Polyvinyl Alcohol-Povidone (REFRESH OP) Apply 1 drop to eye 2 (two) times daily.    No facility-administered encounter medications on file as of 01/16/2015.   No Known Allergies  ROS: no fever, chills, URI symptoms, ear pain, sore throat, cough, shortness of breath, chest pain, palpitations, myalgias, bleeding, bruising, rash, memory concerns, nausea, vomiting, bowel changes, hair/skin changes or other concerns.  +insomnia, anxiety. Denies any breast concerns. Denies pelvic pain, bleeding, discharge.  PHYSICAL EXAM: BP 128/78 mmHg  Pulse 80  Ht 5' (1.524 m)  Wt 145 lb (65.772 kg)  BMI 28.32 kg/m2  Well developed, pleasant, talkative female in no distress HEENT: PERRL, EOMI, conjunctiva and sclera are clear Neck: no lymphadenopathy, thyromegaly or carotid bruit Heart: regular rate and rhythm without murmur Lungs: clear bilaterally Abdomen: soft, nontender, no organomegaly or mass Extremities:no edema, normal pulses Skin: no visible rashes or bruising Psych: normal mood, affect, hygiene and grooming--mood is reportedly anxious. Normal speech, eye contact Neuro: alert and oriented. Normal  strength, gait, cranial nerves.  ASSESSMENT/PLAN:  Pure hypercholesterolemia  Need for prophylactic vaccination and inoculation against influenza - Plan: Flu vaccine HIGH DOSE PF (Fluzone High dose)  Insomnia - trial of melatonin, behavioral techniques. sparingly use diphenhydramine  prn  I believe she is due for Prevnar--she wants to confirm which pneumonia vaccine she last got in [place] (in 2013, so likely pneumovax booster rather than Prevnar). [place] for Prevnar if pneumovax is confirmed. Flu shot given today.  [facility] Screening discussion:  She has never had carotid artery screening or AAA screening.  She is not having any symptoms. She has had DEXA in the past, as well as EKG. Discussed reasons for screening--potentially picking up abnormality prior to symptoms which would allow [place] to check and have her insurance cover.  Her insurance would not cover these screening tests.  Recommended she get it, to give her peace of mind, given limitations of abdominal exam to screen for AAA (obesity), and her anxiety.   Discussed Advil PM at length (GI risks, risks of benadryl use in elderly)  I don't recommend regular use of Advil PM--not because it causes Alzheimers.  The advil shouldn't be used regularly, just as needed for headache (along with food, so it doesn't bother your stomach).  The PM is diphenhydramine, which is what is in Benadryl, and is considered "risky" in elderly.  It can cause confusion, and possibly increased risks for falls with injury. Instead of Advil PM, try taking Melatonin at bedtime.  Other relaxation techniques can also work--medication, soft music, relaxing, deep breathing exercises.  If these aren't effective, then you can try just the diphenhydramine alone--found in Benadryl, or Simply Sleep.  This can be used as needed, doesn't need to be taken nightly (and preferably not used regularly).   3D mammograms are recommended, and should be covered by your insurance.  You may already be getting these--the letter you brought just said the mammogram was normal, not which type they did.  I feel that you can have your cholesterol and liver tests done just once a year--they have all been normal, and your lipids have been at goal. If for some reason your liver tests are up  when you have it checked next time in [place], we absolutely should make sure it gets repeated.  At this point, they were checked in March and August (in ER), and do not need to be repeated.  Your cholesterol has been at goal for many years, and only needs to be checked once yearly.  Please double check with your doctor in [place]--I believe that the pneumonia shot that you had in March of 2013 was another pneumovax (you had one in 2004 also).  If it was NOT, and if it was actually Prevnar-13, then you are up to date. If it was truly a second pneumovax, then you have never had a Prevnar-13 vaccine, and I recommend that you return to the office for a nurse visit to get Prevnar.  Please schedule an annual exam with your gynecologist for when you are in [place]. You should be having yearly breast and pelvic exams (pap smears are no longer needed).  40 min visit today, more than 1/2 spent counseling

## 2015-01-22 ENCOUNTER — Encounter: Payer: Self-pay | Admitting: Family Medicine

## 2015-01-25 ENCOUNTER — Encounter: Payer: Self-pay | Admitting: Family Medicine

## 2015-07-11 LAB — HM MAMMOGRAPHY

## 2015-10-14 ENCOUNTER — Encounter: Payer: Self-pay | Admitting: Family Medicine

## 2015-10-14 ENCOUNTER — Ambulatory Visit (INDEPENDENT_AMBULATORY_CARE_PROVIDER_SITE_OTHER): Payer: Medicare Other | Admitting: Family Medicine

## 2015-10-14 VITALS — BP 122/68 | HR 76 | Ht 58.75 in | Wt 141.0 lb

## 2015-10-14 DIAGNOSIS — E7800 Pure hypercholesterolemia, unspecified: Secondary | ICD-10-CM | POA: Diagnosis not present

## 2015-10-14 DIAGNOSIS — H9193 Unspecified hearing loss, bilateral: Secondary | ICD-10-CM

## 2015-10-14 DIAGNOSIS — H6121 Impacted cerumen, right ear: Secondary | ICD-10-CM

## 2015-10-14 DIAGNOSIS — R202 Paresthesia of skin: Secondary | ICD-10-CM

## 2015-10-14 DIAGNOSIS — Z5181 Encounter for therapeutic drug level monitoring: Secondary | ICD-10-CM | POA: Diagnosis not present

## 2015-10-14 NOTE — Progress Notes (Signed)
Chief Complaint  Patient presents with  . Ear Fullness    feels like her ears are clogged, right worse than left. Has a numbness in her right hand, started back in Feb 2017 and is worsening. Would like you to look at a mole near her right eye, had it burned off at derm but came back-no pain. Had chol checked in [place] in March (brought with her today). Crestor was too expensive and she switched to simvastatin 37m-needed 3 month lipid/liver recheck and that will be sometime in Sept.   . Lab request    was told by a Phd (nutritionist) that she should have B12, CRP and Tropinin levels checked.    She wears hearing aids.  She hasn't been able to get an appointment with the people for her hearing aids--they are not working properly.  For the past 5 days she feels like the right ear is completely plugged.  Denies pain, popping, just worsened hearing.  She has some slight congestion (no fevers, discolored mucus, sinus pain). This feels similar to when she has had wax in her ears in the past.  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  It feels a little stiff, slight discomfort that has persisted since then.  Denies any pain or problems in that finger prior to this injury in February.  She has some numbness in her right hand. She noticed it the other day when she was on the computer, using the mouse with the right hand. She recalls that the pointer finger was involved, otherwise can't say which fingers, "whole hand". Denies neck pain, or pain into the arm. Denies weakness in the right hand.  She is reporting some right knee pain.  She plans to schedule an appointment with Dr. CTheda Sersif it continues.  Mole right temple--was "burned" by Dr. LLedell PeoplesPA last year, along with some other areas. She feels like it never completely went away (vs came back).  She sees them once yearly in September. She wants to make sure it is okay to  wait  Hyperlipidemia:  She switched from Crestor 222mto simvastatin 2093mbout 6 weeks ago due to cost. [facility] [place] been on the Crestor 69m74mr many years. Prior to this, recalls also having taken Vytorin, and Lipitor. She is tolerating simvastatin without side effects.  [facility] [place] [facility]these were done when taking Crestor 69mg27mrmal c-met, TSH, free T4, CBC (hg 15), u/a Lipids: total chol 191, TG 113, HDL 74, LDL 94  She is going to [place] mid-August to mid-September.  Wants labs rechecked when she returns.  She had a visitor come to her exercise class, who has Ph.D. In [employer], building services recommended she have B12, CRP and TSH and troponin levels checked  PMH, PSH, SH and FH reviewed and updated. Brother had CABG at age 11 si86e her last visit here.  Outpatient Encounter Prescriptions as of 10/14/2015  Medication Sig  . Calcium Carbonate-Vitamin D (CALTRATE 600+D) 600-400 MG-UNIT per tablet Take 1 tablet by mouth daily.    . Multiple Vitamins-Minerals (CENTRUM PO) Take 1 tablet by mouth daily.    . simvastatin (ZOCOR) 20 MG tablet Take 20 mg by mouth daily.  . [DISCONTINUED] rosuvastatin (CRESTOR) 20 MG tablet Take 1 tablet (20 mg total) by mouth daily.   No facility-administered encounter medications on file as of  10/14/2015.   No Known Allergies   ROS: no fever, chills, headaches (occasional, relieved by Advil), dizziness.  Slight congestion, no sinus pain, sore throat, cough, shortness of breath. +hearing loss (bilateral but acutely worsening R hearing loss x 5d).  Joint pains, numbness/tingling as per HPI.  Denies chest pain, palpitations, edema, bleeding, bruising or other complaints. See HPI  PHYSICAL EXAM: BP 122/68 mmHg  Pulse 76  Ht 4' 10.75" (1.492 m)  Wt 141 lb (63.957 kg)  BMI 28.73 kg/m2  Well appearing, talkative female, in good spirits, in no distress Skin: Pigmented nevus at her right temple--4 x 3cm,  slightly darker in the upper left quadrant. HEENT: PERRL, EOMI, conjunctiva and sclera are clear.  Right EAC is completely blocked with cerumen. After lavage, TM and EAC is completely normal, hearing improved. L EAC and TM is normal. OP is clear.   Neck: no lymphadenopathy, thyromegaly or mass. No bruit. No cervical spine tenderness Heart: regular rate and rhythm without murmur Lungs: clear bilaterally Extremities: mild arthritic changes noted to PIP joints in the right hand, and DIP of the third finger (slight mallet, doesn't straighten completely). Negative phalen and tinel.  Neuro: Normal strength, sensation, gait, cranial nerves  ASSESSMENT/PLAN:  Pure hypercholesterolemia - at goal on Crestor 49m, doubt that 249msimvastatin will be as effective. adjust dose if indicated after labs in September - Plan: Lipid panel, Hepatic function panel  Medication monitoring encounter - Plan: Lipid panel, Hepatic function panel  Cerumen impaction, right - s/p ear lavage with good results  Right hand paresthesia - normal exam. Ddx reviewed. to pay attention to which fingers/activities. f/u if worsening  Hearing loss, bilateral - to get hearing aids re-evaluated. R hearing improved after cerumen removed   Okay to wait for yearly derm visit.  Fasting lipids, LFT's mid September, after returning from [place].   Discussed B12, TSH--not needed (TSH done, normal CBC, nothing to suggestive B12 deficiency--no macrocytosis or anemia) Troponin--not needed without CP CRP--high sensitivity would be indicated--not sure if covered by insurance.  She declines.  She can check with her insurance and let usKoreanow if she wants added to September labs.  45 min visit today, more than 1/2 spent counseling and answering all of her many questions    There is no evidence of carpal tunnel based on today's exam. Pay attention to which fingers get numb, and what you are doing (ie position of the hand and elbow--if  resting elbow on a hard table, sleeping on your stomach, what activity you're doing). Return for re-evaluation if worsening numbness, or if any weakness develops, or other neurologic symptoms.  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  Consider trying a glucosamine-chondroitin supplement such as Osteobiflex.  Taken daily, this sometimes can help with generalized mild arthritis symptoms (pain/stiffness).  Return in September for cholesterol and liver tests.  I suspect that you will NOT be at goal, and depending on the numbers, we will either increase the simvastatin dose, vs discuss changing to a stronger statin. Continue to follow a lowfat, low cholesterol diet.  Feel free to look into whether or not your insurance covers the hs-CRP (high sensitivity c-reactive protein) blood test.  Diagnosis would be family history of heart disease, and hyperlipidemia.  If it is covered, let usKoreanow and we can do that test with your cholesterol in September. I don't think there is value in the other tests (thyroid has been checked; if you had a B12 deficiency, there would have  been indications in your blood counts done in March; troponin is not a screening test--used when someone is having chest pain).

## 2015-10-14 NOTE — Patient Instructions (Signed)
  There is no evidence of carpal tunnel based on today's exam. Pay attention to which fingers get numb, and what you are doing (ie position of the hand and elbow--if resting elbow on a hard table, sleeping on your stomach, what activity you're doing). Return for re-evaluation if worsening numbness, or if any weakness develops, or other neurologic symptoms.  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  Consider trying a glucosamine-chondroitin supplement such as Osteobiflex.  Taken daily, this sometimes can help with generalized mild arthritis symptoms (pain/stiffness).  Return in September for cholesterol and liver tests.  I suspect that you will NOT be at goal, and depending on the numbers, we will either increase the simvastatin dose, vs discuss changing to a stronger statin. Continue to follow a lowfat, low cholesterol diet.  Feel free to look into whether or not your insurance covers the hs-CRP (high sensitivity c-reactive protein) blood test.  Diagnosis would be family history of heart disease, and hyperlipidemia.  If it is covered, let us know and we can do that test with your cholesterol in September. I don't think there is value in the other tests (thyroid has been checked; if you had a B12 deficiency, there would have been indications in your blood counts done in March; troponin is not a screening test--used when someone is having chest pain).   It is fine for you to wait until September to see the dermatologist to re-evaluate the mole on your right temple.

## 2015-10-16 ENCOUNTER — Encounter: Payer: Self-pay | Admitting: Family Medicine

## 2015-10-25 ENCOUNTER — Encounter: Payer: Self-pay | Admitting: Family Medicine

## 2015-12-26 ENCOUNTER — Telehealth: Payer: Self-pay | Admitting: *Deleted

## 2015-12-26 ENCOUNTER — Other Ambulatory Visit: Payer: Medicare Other

## 2015-12-26 DIAGNOSIS — Z5181 Encounter for therapeutic drug level monitoring: Secondary | ICD-10-CM

## 2015-12-26 DIAGNOSIS — E7800 Pure hypercholesterolemia, unspecified: Secondary | ICD-10-CM

## 2015-12-26 LAB — HEPATIC FUNCTION PANEL
ALK PHOS: 63 U/L (ref 33–130)
ALT: 19 U/L (ref 6–29)
AST: 27 U/L (ref 10–35)
Albumin: 4.2 g/dL (ref 3.6–5.1)
BILIRUBIN DIRECT: 0.1 mg/dL (ref <=0.2)
BILIRUBIN INDIRECT: 0.7 mg/dL (ref 0.2–1.2)
BILIRUBIN TOTAL: 0.8 mg/dL (ref 0.2–1.2)
Total Protein: 6.6 g/dL (ref 6.1–8.1)

## 2015-12-26 LAB — LIPID PANEL
Cholesterol: 222 mg/dL — ABNORMAL HIGH (ref 125–200)
HDL: 75 mg/dL (ref >=46)
LDL CALC: 121 mg/dL (ref <130)
Total CHOL/HDL Ratio: 3 Ratio (ref <=5.0)
Triglycerides: 131 mg/dL (ref <150)
VLDL: 26 mg/dL (ref <30)

## 2015-12-26 NOTE — Telephone Encounter (Signed)
Patient was in for blood draw and dropped of form, wants to know if you can fill out and she will pick up. In folder.

## 2015-12-26 NOTE — Telephone Encounter (Signed)
FFO--please keep copy to scan and advise pt ready to pick up

## 2015-12-26 NOTE — Telephone Encounter (Signed)
Patient advised.

## 2015-12-27 ENCOUNTER — Encounter: Payer: Self-pay | Admitting: Family Medicine

## 2015-12-29 DIAGNOSIS — C4492 Squamous cell carcinoma of skin, unspecified: Secondary | ICD-10-CM

## 2015-12-29 HISTORY — DX: Squamous cell carcinoma of skin, unspecified: C44.92

## 2016-01-01 ENCOUNTER — Telehealth: Payer: Self-pay | Admitting: *Deleted

## 2016-01-01 NOTE — Telephone Encounter (Signed)
Ok to change to 20mg  atorvastatin. If she develops any side effects, take coenzyme Q10 along with it (if not already taking).  Set up fasting labs for 3 months--LFT's and lipids. Okay for 90d supply

## 2016-01-01 NOTE — Telephone Encounter (Signed)
Patient called and she received her lab result letter today. Wants you to know that atorvastatin is the same price as simvastatin on her insurance if you still think that would be a better choice and wanted to switch her.

## 2016-01-02 ENCOUNTER — Other Ambulatory Visit: Payer: Self-pay

## 2016-01-02 NOTE — Telephone Encounter (Signed)
Patient will call back in two weeks, she wants to finish her simvastatin.

## 2016-02-12 ENCOUNTER — Telehealth: Payer: Self-pay

## 2016-02-12 MED ORDER — ATORVASTATIN CALCIUM 20 MG PO TABS: 20.0000 mg | ORAL_TABLET | Freq: Every day | ORAL | 0 refills | Status: DC

## 2016-02-12 NOTE — Telephone Encounter (Signed)
Pt request Lipitor 20mg  sent to [facility] to [phone]. Pt states that she was switched from simvastatin to lipitor a few months ago and is requesting refill of this sent to [facility]. Pt states she has not taken the Lipitor yet.   Sending to you because Lipitor is not on med list.   Thanks Wells Guiles

## 2016-02-12 NOTE — Telephone Encounter (Signed)
See message from 10/4

## 2016-02-27 ENCOUNTER — Encounter: Payer: Self-pay | Admitting: Family Medicine

## 2016-02-27 ENCOUNTER — Ambulatory Visit (INDEPENDENT_AMBULATORY_CARE_PROVIDER_SITE_OTHER): Payer: Medicare Other | Admitting: Family Medicine

## 2016-02-27 VITALS — BP 130/72 | HR 76 | Temp 98.4°F | Ht 58.75 in | Wt 143.6 lb

## 2016-02-27 DIAGNOSIS — L659 Nonscarring hair loss, unspecified: Secondary | ICD-10-CM | POA: Diagnosis not present

## 2016-02-27 DIAGNOSIS — J069 Acute upper respiratory infection, unspecified: Secondary | ICD-10-CM | POA: Diagnosis not present

## 2016-02-27 MED ORDER — AMOXICILLIN 875 MG PO TABS: 875.0000 mg | ORAL_TABLET | Freq: Two times a day (BID) | ORAL | 0 refills | Status: DC

## 2016-02-27 NOTE — Progress Notes (Signed)
Chief Complaint  Patient presents with  . Cough    symptoms started about 2 weeks ago, mucus is yellow. No fevers.   . Hair/Scalp Problem    has noticed some hairloss and eyebrows thinning, started using biotin-wants your opinion.   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   She started with runny nose, sneezing about 2 weeks ago.  She then developed cough. It got better over Thanksgiving, but in the last few days the cough has recurred.  Phlegm is yellow-ish white.  OTC meds and neti-pot help temporarily.  She is worried about getting pneumonia.  Denies fever, shortness of breath.  Some tightness in the chest started recently.  Cough is more dry than productive.  She continues to have runny nose--mucus is light yellow and thicker than when the illness first started.  She has been using Tussin DM, Advil cold and Sinus and Hall's drops as well as the neti-pot.  She also is reporting some hair thinning on the sides and the back of her scalp.  She is also noticing her eyebrows thinning and she is losing her eyelashes.  She is now teasing her hair with a new hairdo, using more hairspray.   She last had her thyroid checked in March in [place]. She feels like the hair loss has been more recent than this. Denies any significant change in diet or stress within the last 3-6 months, just the new hairdo.  Denies any significant fatigue, weight changes, bowel changes, temperature intolerance.  PMH, [place] reviewed History updated re: SCC from her temple  Outpatient Encounter Prescriptions as of 02/27/2016  Medication Sig Note  . Biotin 5000 MCG TABS Take 1 tablet by mouth daily.   . Calcium Carbonate-Vitamin D (CALTRATE 600+D) 600-400 MG-UNIT per tablet Take 1 tablet by mouth daily.     . Multiple Vitamins-Minerals (CENTRUM PO) Take 1 tablet by mouth daily.     . pseudoephedrine-dextromethorphan-guaifenesin (ROBITUSSIN-PE)  30-10-100 MG/5ML solution Take 10 mLs by mouth 4 (four) times daily as needed for cough.   . Pseudoephedrine-Ibuprofen 30-200 MG TABS Take 1 tablet by mouth as needed.   Marland Kitchen amoxicillin (AMOXIL) 875 MG tablet Take 1 tablet (875 mg total) by mouth 2 (two) times daily.   Marland Kitchen atorvastatin (LIPITOR) 20 MG tablet Take 1 tablet (20 mg total) by mouth daily. (Patient not taking: Reported on 02/27/2016) 02/27/2016: Waiting on to arrive-taking simvastatin currently   No facility-administered encounter medications on file as of 02/27/2016.    (amox rx'd today, not prior to visit)  No Known Allergies  ROS: no dizziness, chest pain, palpitations, shortness of breath, nausea, vomiting, diarrhea, bleeding, bruising, rash, weight changes, anorexia.  She is getting hearing aids. See HPI.  PHYSICAL EXAM:  BP 130/72 (BP Location: Left Arm, Patient Position: Sitting, Cuff Size: Normal)   Pulse 76   Temp 98.4 F (36.9 C) (Tympanic)   Ht 4' 10.75" (1.492 m)   Wt 143 lb 9.6 oz (65.1 kg)   BMI 29.25 kg/m   Well developed, pleasant female in no distress.  Rare dry cough during visit. HEENT: Some diffuse thinning of scalp. No flaking or areas of alopecia. She was rubbing at her eyes some during visit, thin eyelashes Slight thinning of lateral edges of eyebrows Nasal mucosa is mildly edematous, no purulence Sinuses nontender. TM's and EAc's normal. PERRL, EOMI, conjunctiva and sclera are clear. Neck: no lymphadenopathy or mass. No thyromegaly  Heart: regular rate and rhythm without murmur Lungs: clear bilaterally Skin: normal turgor, no rash Neuro: alert and oriented, cranial nerves intact, normal gait, strength Psych: normal mood, affect, hygiene and grooming  ASSESSMENT/PLAN:  Acute upper respiratory infection - suspect component of allergies contributing, possibly with secondary infection. Start amoxil if persistent discolored mucus; Add claritin - Plan: amoxicillin (AMOXIL) 875 MG tablet  Hair loss -  Ddx reviewed. discussed hair/nail/skin vitamins if thyroid is normal. Hair spray/teasing might contribute to fragility - Plan: TSH  Counseled extensively re: allergies vs URI's, OTC meds, supportive measures, s/sx of bacterial infection. Start Amoxil if not improving with addition of claritin to her present OTC meds.  Discussed hair loss, OTC meds  30 min visit, more than 1/2 spent counseling    Hair loss: If your thyroid test is normal, then I do agree with taking hair/skin/nail vitamins. Sometimes hair products (color, spray, etc) can change the texture and make the hair more brittle.  I do believe there may be a component of allergies to your symptoms (runny nose, cough, itchy eyes).  If your mucus remains discolored, then that means you likely have a bacterial infection also starting).  Continue the tussin DM (versus changing to 12 hours Mucinex DM--your choice). Continue the decongestant as needed for sinus pressure. I do recommend restarting an antihistamine such as claritin (to help with the runny nose and itchy eyes). Start the antibiotic if the mucus remains discolored and you are getting worse.

## 2016-02-27 NOTE — Patient Instructions (Signed)
  Hair loss: If your thyroid test is normal, then I do agree with taking hair/skin/nail vitamins. Sometimes hair products (color, spray, etc) can change the texture and make the hair more brittle.  I do believe there may be a component of allergies to your symptoms (runny nose, cough, itchy eyes).  If your mucus remains discolored, then that means you likely have a bacterial infection also starting).  Continue the tussin DM (versus changing to 12 hours Mucinex DM--your choice). Continue the decongestant as needed for sinus pressure. I do recommend restarting an antihistamine such as claritin (to help with the runny nose and itchy eyes). Start the antibiotic if the mucus remains discolored and you are getting worse.

## 2016-02-28 LAB — TSH: TSH: 0.98 mIU/L

## 2016-04-14 ENCOUNTER — Other Ambulatory Visit: Payer: Self-pay | Admitting: Family Medicine

## 2016-04-14 NOTE — Telephone Encounter (Signed)
Patient needs a lab draw LFT per Dr.Knapps Note 01/01/16

## 2016-06-25 LAB — HM DEXA SCAN: HM Dexa Scan: NORMAL

## 2016-07-16 LAB — HM MAMMOGRAPHY

## 2016-10-29 ENCOUNTER — Encounter: Payer: Self-pay | Admitting: Family Medicine

## 2016-10-29 ENCOUNTER — Ambulatory Visit (INDEPENDENT_AMBULATORY_CARE_PROVIDER_SITE_OTHER): Payer: Medicare Other | Admitting: Family Medicine

## 2016-10-29 VITALS — BP 146/86 | HR 100 | Ht 59.0 in | Wt 134.0 lb

## 2016-10-29 DIAGNOSIS — G4700 Insomnia, unspecified: Secondary | ICD-10-CM

## 2016-10-29 DIAGNOSIS — F418 Other specified anxiety disorders: Secondary | ICD-10-CM

## 2016-10-29 DIAGNOSIS — M1711 Unilateral primary osteoarthritis, right knee: Secondary | ICD-10-CM

## 2016-10-29 MED ORDER — SUVOREXANT 10 MG PO TABS: 1.0000 | ORAL_TABLET | Freq: Every evening | ORAL | 0 refills | Status: DC | PRN

## 2016-10-29 NOTE — Patient Instructions (Addendum)
Consider glucosamine-chondroitin (such as Osteo-biflex). There is no harm in trying the [place] liver oil (I have heard similar things regarding omega-3 fish oil). Tylenol Arthritis is preferred over advil.  It is okay to use tylenol products along with advil, as well as along with the tramadol, if needed. If using anti-inflammatories such as advil on a daily, chronic basis, I worry about you developing gastritis or ulcers. Taking a medication to protect your stomach, such as prilosec once daily, or zantac or pepcid twice daily is recommended. If only using advil sporadically, then you probably don't need the stomach medication--just be sure to take the advil with food, and cut back if you develop an upper stomach pain.  Follow up with your orthopedist when you return if your knee pain is still bad.  Work on relaxation techniques prior to bedtime, as we discussed--deep breathing exercises, relaxation techniques.  Please see handout on sleep hygiene. Try and limit your caffeine.  Consider meeting with a therapist to help with your anxiety, if it doesn't seem to be improving quickly.  Check with the folks at [facility] on [address] (near [facility] is a great therapist, but the rest in the office are also quite good.  Take the Belsomra samples at bedtime as discussed.  If this isn't effective, or you have side effects, let us know.  If it is effective, and you need a prescription, let us know which pharmacy you want [place] to send it to.  There may be insurance issues, needing prior auth, so there may be some delays.  The other medications we briefly discussed were alprazolam--this is a medication to treat anxiety, that also makes you sleepy, and can be used at bedtime to help shut down the brain. This isn't recommended for regular, daily use, just sporadically.  It is not a "safe" medication for long-term use in the elderly.  Ambien and Lunesta are other sleeping medications--also not  necessarily the best choice for elderly--the Belsomra is safer, but if needed, we may need to try those at low dose.    Insomnia Insomnia is a sleep disorder that makes it difficult to fall asleep or to stay asleep. Insomnia can cause tiredness (fatigue), low energy, difficulty concentrating, mood swings, and poor performance at work or school. There are three different ways to classify insomnia:  Difficulty falling asleep.  Difficulty staying asleep.  Waking up too early in the morning.  Any type of insomnia can be long-term (chronic) or short-term (acute). Both are common. Short-term insomnia usually lasts for three months or less. Chronic insomnia occurs at least three times a week for longer than three months. What are the causes? Insomnia may be caused by another condition, situation, or substance, such as:  Anxiety.  Certain medicines.  Gastroesophageal reflux disease (GERD) or other gastrointestinal conditions.  Asthma or other breathing conditions.  Restless legs syndrome, sleep apnea, or other sleep disorders.  Chronic pain.  Menopause. This may include hot flashes.  Stroke.  Abuse of alcohol, tobacco, or illegal drugs.  Depression.  Caffeine.  Neurological disorders, such as Alzheimer disease.  An overactive thyroid (hyperthyroidism).  The cause of insomnia may not be known. What increases the risk? Risk factors for insomnia include:  Gender. Women are more commonly affected than men.  Age. Insomnia is more common as you get older.  Stress. This may involve your professional or personal life.  Income. Insomnia is more common in people with lower income.  Lack of exercise.  Irregular work schedule or night shifts.  Traveling between different time zones.  What are the signs or symptoms? If you have insomnia, trouble falling asleep or trouble staying asleep is the main symptom. This may lead to other symptoms, such as:  Feeling  fatigued.  Feeling nervous about going to sleep.  Not feeling rested in the morning.  Having trouble concentrating.  Feeling irritable, anxious, or depressed.  How is this treated? Treatment for insomnia depends on the cause. If your insomnia is caused by an underlying condition, treatment will focus on addressing the condition. Treatment may also include:  Medicines to help you sleep.  Counseling or therapy.  Lifestyle adjustments.  Follow these instructions at home:  Take medicines only as directed by your health care provider.  Keep regular sleeping and waking hours. Avoid naps.  Keep a sleep diary to help you and your health care provider figure out what could be causing your insomnia. Include: ? When you sleep. ? When you wake up during the night. ? How well you sleep. ? How rested you feel the next day. ? Any side effects of medicines you are taking. ? What you eat and drink.  Make your bedroom a comfortable place where it is easy to fall asleep: ? Put up shades or special blackout curtains to block light from outside. ? Use a white noise machine to block noise. ? Keep the temperature cool.  Exercise regularly as directed by your health care provider. Avoid exercising right before bedtime.  Use relaxation techniques to manage stress. Ask your health care provider to suggest some techniques that may work well for you. These may include: ? Breathing exercises. ? Routines to release muscle tension. ? Visualizing peaceful scenes.  Cut back on alcohol, caffeinated beverages, and cigarettes, especially close to bedtime. These can disrupt your sleep.  Do not overeat or eat spicy foods right before bedtime. This can lead to digestive discomfort that can make it hard for you to sleep.  Limit screen use before bedtime. This includes: ? Watching TV. ? Using your smartphone, tablet, and computer.  Stick to a routine. This can help you fall asleep faster. Try to do a  quiet activity, brush your teeth, and go to bed at the same time each night.  Get out of bed if you are still awake after 15 minutes of trying to sleep. Keep the lights down, but try reading or doing a quiet activity. When you feel sleepy, go back to bed.  Make sure that you drive carefully. Avoid driving if you feel very sleepy.  Keep all follow-up appointments as directed by your health care provider. This is important. Contact a health care provider if:  You are tired throughout the day or have trouble in your daily routine due to sleepiness.  You continue to have sleep problems or your sleep problems get worse. Get help right away if:  You have serious thoughts about hurting yourself or someone else. This information is not intended to replace advice given to you by your health care provider. Make sure you discuss any questions you have with your health care provider. Document Released: 03/13/2000 Document Revised: 08/16/2015 Document Reviewed: 12/15/2013 Elsevier Interactive Patient Education  2018 [employer].   Generalized Anxiety Disorder, Adult Generalized anxiety disorder (GAD) is a mental health disorder. People with this condition constantly worry about everyday events. Unlike normal anxiety, worry related to GAD is not triggered by a specific event. These worries also do not fade or  get better with time. GAD interferes with life functions, including relationships, work, and school. GAD can vary from mild to severe. People with severe GAD can have intense waves of anxiety with physical symptoms (panic attacks). What are the causes? The exact cause of GAD is not known. What increases the risk? This condition is more likely to develop in:  Women.  People who have a family history of anxiety disorders.  People who are very shy.  People who experience very stressful life events, such as the death of a loved one.  People who have a very stressful family  environment.  What are the signs or symptoms? People with GAD often worry excessively about many things in their lives, such as their health and family. They may also be overly concerned about:  Doing well at work.  Being on time.  Natural disasters.  Friendships.  Physical symptoms of GAD include:  Fatigue.  Muscle tension or having muscle twitches.  Trembling or feeling shaky.  Being easily startled.  Feeling like your heart is pounding or racing.  Feeling out of breath or like you cannot take a deep breath.  Having trouble falling asleep or staying asleep.  Sweating.  Nausea, diarrhea, or irritable bowel syndrome (IBS).  Headaches.  Trouble concentrating or remembering facts.  Restlessness.  Irritability.  How is this diagnosed? Your health care provider can diagnose GAD based on your symptoms and medical history. You will also have a physical exam. The health care provider will ask specific questions about your symptoms, including how severe they are, when they started, and if they come and go. Your health care provider may ask you about your use of alcohol or drugs, including prescription medicines. Your health care provider may refer you to a mental health specialist for further evaluation. Your health care provider will do a thorough examination and may perform additional tests to rule out other possible causes of your symptoms. To be diagnosed with GAD, a person must have anxiety that:  Is out of his or her control.  Affects several different aspects of his or her life, such as work and relationships.  Causes distress that makes him or her unable to take part in normal activities.  Includes at least three physical symptoms of GAD, such as restlessness, fatigue, trouble concentrating, irritability, muscle tension, or sleep problems.  Before your health care provider can confirm a diagnosis of GAD, these symptoms must be present more days than they are not,  and they must last for six months or longer. How is this treated? The following therapies are usually used to treat GAD:  Medicine. Antidepressant medicine is usually prescribed for long-term daily control. Antianxiety medicines may be added in severe cases, especially when panic attacks occur.  Talk therapy (psychotherapy). Certain types of talk therapy can be helpful in treating GAD by providing support, education, and guidance. Options include: ? Cognitive behavioral therapy (CBT). People learn coping skills and techniques to ease their anxiety. They learn to identify unrealistic or negative thoughts and behaviors and to replace them with positive ones. ? Acceptance and commitment therapy (ACT). This treatment teaches people how to be mindful as a way to cope with unwanted thoughts and feelings. ? Biofeedback. This process trains you to manage your body's response (physiological response) through breathing techniques and relaxation methods. You will work with a therapist while machines are used to monitor your physical symptoms.  Stress management techniques. These include yoga, meditation, and exercise.  A mental health specialist  can help determine which treatment is best for you. Some people see improvement with one type of therapy. However, other people require a combination of therapies. Follow these instructions at home:  Take over-the-counter and prescription medicines only as told by your health care provider.  Try to maintain a normal routine.  Try to anticipate stressful situations and allow extra time to manage them.  Practice any stress management or self-calming techniques as taught by your health care provider.  Do not punish yourself for setbacks or for not making progress.  Try to recognize your accomplishments, even if they are small.  Keep all follow-up visits as told by your health care provider. This is important. Contact a health care provider if:  Your symptoms  do not get better.  Your symptoms get worse.  You have signs of depression, such as: ? A persistently sad, cranky, or irritable mood. ? Loss of enjoyment in activities that used to bring you joy. ? Change in weight or eating. ? Changes in sleeping habits. ? Avoiding friends or family members. ? Loss of energy for normal tasks. ? Feelings of guilt or worthlessness. Get help right away if:  You have serious thoughts about hurting yourself or others. If you ever feel like you may hurt yourself or others, or have thoughts about taking your own life, get help right away. You can go to your nearest emergency department or call:  Your local emergency services (911 in the [place]  A suicide crisis helpline, such as the [facility] at (949)266-1344. This is open 24 hours a day.  Summary  Generalized anxiety disorder (GAD) is a mental health disorder that involves worry that is not triggered by a specific event.  People with GAD often worry excessively about many things in their lives, such as their health and family.  GAD may cause physical symptoms such as restlessness, trouble concentrating, sleep problems, frequent sweating, nausea, diarrhea, headaches, and trembling or muscle twitching.  A mental health specialist can help determine which treatment is best for you. Some people see improvement with one type of therapy. However, other people require a combination of therapies. This information is not intended to replace advice given to you by your health care provider. Make sure you discuss any questions you have with your health care provider. Document Released: 07/11/2012 Document Revised: 02/04/2016 Document Reviewed: 02/04/2016 Elsevier Interactive Patient Education  [employer].

## 2016-10-29 NOTE — Progress Notes (Signed)
Chief Complaint  Patient presents with  . Anxiety    patient having a lot of stress and anxiety due to downsizing her home. Taking a trip to [place] soon ans unsure if she should. Unable to sleep at night. R knee pain x 3 mo, saw ortho. Also having some weightloss that she is concerned about as well.    She presents with complaint of trouble sleeping at night, complaining of anxiety.  Stress is related to moving into smaller home.  Trip planned in January, having second thoughts, worrying about this. Worrying about the arthritis in her knee.  Going to [place] 8/23, then to [place] x 2 weeks.  Saw Dr. Theda Sers PA 10/06/16 for right knee pain, told she has arthritis: Medial compartment OA changes; mod patellofemoral and mild lateral compartment, near bone-on-bone OA (per patient reading her notes). Got a cortisone injection (previously got and helped), this time wasn't helpful.  Visco injections recommended upon return from her trip. She was prescribed tramadol 50mg .  She took one, slept well, felt great.  Took 1-2x/day for 6 days with good effects. She felt great on it, and was sleeping well. She called to request more for her trip, but was denied. She was told that there was the potential for addiction, and this scared her.  Still has some left--stopped it when she was told she could get addicted. She denies that her pain is what keeps her awake, but the pain pills help her sleep.  Anxiety is what is keeping her up at night.  Advil helps some with her knee pain, tylenol wasn't effective when she tried it for pain in the past (not recently). She started cod liver oil 2 days ago after reading an article on it helping for arthritis.  She has had problems sleeping in the past.  Recalls using a PM medication with good results, but stopped taking it when she read it can cause memory problems.  She has tried various teas, which help sometimes (but didn't help last night). Melatonin was tried last year, not  helpful.  She is also concerned about her weight. She gains weight when she is in [place], was 141# when she left there to return to [place]. She has been trying to lose a little, but got concerned when she didn't have much of an appetite last night, couldn't finish her meal. No hair/skin/bowel changes.   She also brings in records from her files from [place], which were reviewed.  mammo 06/2016 normal DEXA 05/2016--normal. Consider repeat 5 years 05/2016 chol 210, HDL 68, LDL-direct 128, TG 108 (LDL cal 120), normal CBC, B12, folate,  Normal chem--glu 88, Cr 0.9, normal LFT's TSH 0.681 05/2016--+ANA, with negative reflex testing. (done for hair loss)  PMH, PSH, SH reviewed  Outpatient Encounter Prescriptions as of 10/29/2016  Medication Sig Note  . atorvastatin (LIPITOR) 20 MG tablet TAKE 1 TABLET BY MOUTH  DAILY   . Calcium Carbonate-Vitamin D (CALTRATE 600+D) 600-400 MG-UNIT per tablet Take 1 tablet by mouth daily.     Marland Kitchen Cod Liver Oil 1000 MG CAPS Take 1 capsule by mouth daily. 10/29/2016: She started this 2 days ago  . Multiple Vitamins-Minerals (CENTRUM PO) Take 1 tablet by mouth daily.     . Biotin 5000 MCG TABS Take 1 tablet by mouth daily.   Marland Kitchen ibuprofen (ADVIL,MOTRIN) 200 MG tablet Take 200 mg by mouth as needed. 10/29/2016: Uses 1-2/d as needed  . Suvorexant (BELSOMRA) 10 MG TABS Take 1 tablet by mouth at  bedtime as needed (sleep).   . [DISCONTINUED] amoxicillin (AMOXIL) 875 MG tablet Take 1 tablet (875 mg total) by mouth 2 (two) times daily.   . [DISCONTINUED] pseudoephedrine-dextromethorphan-guaifenesin (ROBITUSSIN-PE) 30-10-100 MG/5ML solution Take 10 mLs by mouth 4 (four) times daily as needed for cough.   . [DISCONTINUED] Pseudoephedrine-Ibuprofen 30-200 MG TABS Take 1 tablet by mouth as needed.    No facility-administered encounter medications on file as of 10/29/2016.    (Belsomra rx'd today, not using prior to visit).  No Known Allergies  ROS: no fever, chills, headaches, dizziness,  chest pain, neurologic complaints, URI symptoms, cough, shortness of breath, GI complaints.  +right knee pain per HPI.  +some hair loss--has been stable, not worsening. This was why additional labs were checked, including the ANA, which was positive.  She was referred to rheum, asking if she needs to go. She denies any other joint pains or skin concerns, just the knee.  +anxiety and insomnia per HPI.  Denies depression. Saw derm Monday--something frozen on her nose.   PHYSICAL EXAM:  BP (!) 146/86 (BP Location: Right Arm, Patient Position: Supine, Cuff Size: Normal)   Pulse 100   Ht 4\' 11"  (1.499 m)   Wt 134 lb (60.8 kg)   BMI 27.06 kg/m   160/80 on repeat by MD  Anxious-appearing older female in no distress HEENT: conjunctiva and sclera clear, OP clear. 2 large blisters on her nose (from recent cryotherapy by derm) Neck: no lymphadenopathy, thyromegaly or mass Heart: tachycardic, pulse 100, with frequent PVC's Lungs: clear bilaterally Extremities: no edema Psych: anxious, normal hygiene, grooming, speech, eye contact Neuro: alert and oriented, cranial nerves intact, normal gait, strength  ASSESSMENT/PLAN:  Insomnia, unspecified type - risks/side effects of many meds reviewed. Belsomra samples given(safest class). Discussed OTC's, zolpidem/lunesta and alprazolam prn. Sleep hygiene reviewed - Plan: Suvorexant (BELSOMRA) 10 MG TABS  Situational anxiety - counseled re: stress reduction, relaxation techniques. Consider counseling. Consider prn alprazolam if not improving (not preferred tx)  Primary osteoarthritis of right knee - OTC meds reviewed. f/u with ortho upon return from [place] if ongoing pain  30 min visit, more than 1/2 spent counseling.  F/u September for med check, sooner prn.    Consider glucosamine-chondroitin (such as Osteo-biflex). There is no harm in trying the [place] liver oil (I have heard similar things regarding omega-3 fish oil). Tylenol Arthritis is preferred  over advil.  It is okay to use tylenol products along with advil, as well as along with the tramadol, if needed. If using anti-inflammatories such as advil on a daily, chronic basis, I worry about you developing gastritis or ulcers. Taking a medication to protect your stomach, such as prilosec once daily, or zantac or pepcid twice daily is recommended. If only using advil sporadically, then you probably don't need the stomach medication--just be sure to take the advil with food, and cut back if you develop an upper stomach pain.  Follow up with your orthopedist when you return if your knee pain is still bad.  Work on relaxation techniques prior to bedtime, as we discussed--deep breathing exercises, relaxation techniques.  Please see handout on sleep hygiene. Try and limit your caffeine.

## 2016-11-02 ENCOUNTER — Telehealth: Payer: Self-pay | Admitting: Family Medicine

## 2016-11-02 NOTE — Telephone Encounter (Signed)
Since anxiety is a component, as a short-term recommendation, have her try alprazolam.  rx 0.25mg  tablets.  She can use 1/2 to 1 during the day if needed for extreme anxiety, and 1-2 tablets at bedtime--this should calm the mind and let her sleep (it is also sedating).  Start with just 1 at bedtime, can take up to 2 if needed. She needs to know not to drive while taking. This is usually the medication I recommend for flying (for people who have anxiety).  Why don't we see how she feels after a couple of nights of good sleep--if she still wants to cancel her trip ,I'm happy to write a letter.  But if she gets some sleep and realizes that this med helps, she may be able to go. prozac is a medication used for prevention of anxiety (and depression)--it does up to 4-6 weeks to see the effect.  The anxiety is fairly new for her, and I'm not sure that she will need this.  She can return to discuss in more detail if it looks like anxiety will be ongoing (depending on when she moves, etc)

## 2016-11-02 NOTE — Telephone Encounter (Signed)
Pt called and states that she is not is not having any luck with the belsomra states she is not sleeping any, she as used it for 3 nights and it did not help any, she is wanting to know if there is something else she can take, she has heard of the prozac from her son,not for sure if this is safe to take,  she is also having a bunch of anxiety about her trip she is to go to [place] in September she is wanting a letter stating she is having to much anxiety to give the air line so she can give them to they will refund her money pt uses [facility] [address], [place], [place] [address]  she can be reached at [phone]

## 2016-11-02 NOTE — Telephone Encounter (Signed)
Pt called and advised she still would like the letter and the Alprazolam.  I gave her all of your recommendations. Alprazolam need to go to the [facility] on [phone] .   Also she said you told her Alprazolam was not good for elderly people.

## 2016-11-02 NOTE — Telephone Encounter (Signed)
Pt called and letter will be written to [employer]  Booking Ref [id number] She will pick up on Wed at office Visit.  Advised #20 being called to [facility] and gave new instructions to pt. Called [facility] ordered Alprazolam  .25 mg   #20.

## 2016-11-02 NOTE — Telephone Encounter (Signed)
Called pt home number reached voice mail [email or web address].

## 2016-11-02 NOTE — Telephone Encounter (Signed)
Okay to call in #20 of the alprazolam.  She was started on a low dose of Belsomra, we could try increasing the dose. That would be a good plan if insomnia is a long-term issue, if we don't want her to take the alprazolam regularly.  But for short-term issues she is having related to anxiety, let's try this for a week and see how she feels in a week.  Please call in alprazolam as stated below (0.25mg , #20, no refill). Okay for letter

## 2016-11-03 ENCOUNTER — Encounter: Payer: Self-pay | Admitting: Family Medicine

## 2016-11-04 ENCOUNTER — Ambulatory Visit (INDEPENDENT_AMBULATORY_CARE_PROVIDER_SITE_OTHER): Payer: Medicare Other | Admitting: Family Medicine

## 2016-11-04 ENCOUNTER — Encounter: Payer: Self-pay | Admitting: Family Medicine

## 2016-11-04 VITALS — BP 144/80 | HR 80 | Temp 98.5°F | Ht 59.0 in | Wt 135.8 lb

## 2016-11-04 DIAGNOSIS — R05 Cough: Secondary | ICD-10-CM

## 2016-11-04 DIAGNOSIS — F418 Other specified anxiety disorders: Secondary | ICD-10-CM | POA: Diagnosis not present

## 2016-11-04 DIAGNOSIS — R059 Cough, unspecified: Secondary | ICD-10-CM

## 2016-11-04 DIAGNOSIS — J309 Allergic rhinitis, unspecified: Secondary | ICD-10-CM | POA: Diagnosis not present

## 2016-11-04 DIAGNOSIS — G4700 Insomnia, unspecified: Secondary | ICD-10-CM

## 2016-11-04 NOTE — Progress Notes (Signed)
Chief Complaint  Patient presents with  . Anxiety    needs note for airline. Belsomra didn't work well, but the alprazolam seems to be working well.   . Cough    has had a cough x couple months-thinks it may be acid reflux.   Patient presents to fu on anxiety, insomnia.  She didn't find the belsomra helpful (took 3 days of 10mg  samples).  She was then given alprazolam.  She took one on Monday night and one last night.  Slept very well. She hasn't taken any during the day (due to driving). She feels much better, thinks she will try not taking one tonight. She believes she only got 6 pills (not documented in computer quantity phoned in)  Plans to still go to [place] for a month (just not to [place]); her house will start showing then, while she is away. Doesn't want to go to [place]. She is much to anxious. Plans to go back to [facility] end of December Leaves for [place] 8/23  She is also complaining of an irritating dry cough. She states that her son thinks she has acid reflux causing her cough. Denies any productive cough.  Coughs at church, embarrassing. Denies belching, heartburn. She is also sneezing, but denies allergies.  PMH, PSH, SH reviewed  Outpatient Encounter Prescriptions as of 11/04/2016  Medication Sig Note  . ALPRAZolam (XANAX) 0.25 MG tablet TK 1/2 TO 1 T PO DURING THE DAY FOR EXTREME ANXIETY AND 1-2 TS AT BEDTIME PRN FOR SLEEP   . atorvastatin (LIPITOR) 20 MG tablet TAKE 1 TABLET BY MOUTH  DAILY   . Biotin 5000 MCG TABS Take 1 tablet by mouth daily.   . Calcium Carbonate-Vitamin D (CALTRATE 600+D) 600-400 MG-UNIT per tablet Take 1 tablet by mouth daily.     Marland Kitchen Cod Liver Oil 1000 MG CAPS Take 1 capsule by mouth daily. 10/29/2016: She started this 2 days ago  . Multiple Vitamins-Minerals (CENTRUM PO) Take 1 tablet by mouth daily.     Marland Kitchen ibuprofen (ADVIL,MOTRIN) 200 MG tablet Take 200 mg by mouth as needed. 10/29/2016: Uses 1-2/d as needed  . Suvorexant (BELSOMRA) 10 MG TABS Take 1 tablet by mouth  at bedtime as needed (sleep). (Patient not taking: Reported on 11/04/2016)    No facility-administered encounter medications on file as of 11/04/2016.    No Known Allergies  ROS:  No fever, chills, headaches, dizziness.  +anxiety/worry, insomnia per HPI.  Denies nausea, vomiting, bowel changes, heartburn.  + cough, sneezing.  No shortness of breath, chest pain.  Denies depression. Denies bleeding, bruising, rash.  Recently treated areas on nose are healing  PHYSICAL EXAM: BP (!) 150/90 (BP Location: Left Arm, Patient Position: Sitting, Cuff Size: Normal)   Pulse 80   Temp 98.5 F (36.9 C) (Tympanic)   Ht 4\' 11"  (1.499 m)   Wt 135 lb 12.8 oz (61.6 kg)   BMI 27.43 kg/m    144/80 on repeat by MD Pleasant, well-appearing female, appears calm. Frequent throat clearing during visit. Initial BP was checked after a coughing spell HEENT: PERRL, EOMI, conjunctiva and sclera are clear. TM's and EAC's normal. Nasal mucosa is mod edematous, no purulence or erythema. Sinuses are nontender. OP is clear Neck: no lymphadenopathy, thyromegaly or mass Heart: regular rate and rhythm Lungs: clear bilaterally, no wheezes, rales, ronchi Extremities: no edema Skin: normal turgor, no rash Psych: normal mood, affect, hygiene and grooming.  Asks many questions, mildly anxious.   ASSESSMENT/PLAN:  Situational anxiety - Letter  written for flight to [place]; Encouraged counseling, stress reduction techniques. Cont alprazolam prn, sparingly  Insomnia, unspecified type - Discussed stress reduction/relaxation techniques. Continue alprazolam prn, sparingly, risks/side effects reviewed. Short-term  Cough - no e/o GERD; +e/o allergies on exam. encouraged antihistamines, flonase, Mucinex  Allergic rhinitis, unspecified seasonality, unspecified trigger   Called in #[phone] to [place] to take with her.  Continue to use the alprazolam sparingly, if needed for anxiety or sleep. Try and use it sporadically rather  than every night--ie when you haven't slept well for a few days. If you find that your anxiety is persistent throughout the day, and not improving with time (over the next few weeks), then we need to consider a preventative medication such as citalopram (celexa) or lexapro (similar to prozac, but I think you will tolerate these better).    Be sure to have stress reduction techniques, and people to talk to.  Consider counseling if anxiety continues to be a problem.  I suspect that your cough is related to postnasal drainage and allergies. Take claritin or Allegra (store brands are fine) once daily. Do not take "D" versions which contain decongestant as these will raise your blood pressure. If you aren't getting adequate relief from taking this medication daily, then you can start using a nasal steroid spray such as Flonase or Nasacort. These are available at the pharmacy without a prescription.  You need to use 2 sprays into each nostril every day, and it can take up to 1.5 weeks to see the full effect.  You need to continue to use this spray daily.  Overlap the spray with the claritin/alllegra for the first week, and then try stopping the pills. If you need to continue both medications longterm (the pill and the spray) that is completely safe.  I think you would also benefit from using Mucinex regularly.  I recommend taking the 12 hour plain mucinex twice daily.  The active ingredient is guaifenesin, which is an expectorant to help keep the phlegm and mucus thin, which helps it drain better and decrease the cough. You may not need to use this long-term, just as needed for the cough, use regularly initially for a week or so.  It is safe to use long-term if needed.

## 2016-11-04 NOTE — Patient Instructions (Signed)
  Continue to use the alprazolam sparingly, if needed for anxiety or sleep. Try and use it sporadically rather than every night--ie when you haven't slept well for a few days. If you find that your anxiety is persistent throughout the day, and not improving with time (over the next few weeks), then we need to consider a preventative medication such as citalopram (celexa) or lexapro (similar to prozac, but I think you will tolerate these better).    Be sure to have stress reduction techniques, and people to talk to.  Consider counseling if anxiety continues to be a problem.  I suspect that your cough is related to postnasal drainage and allergies. Take claritin or Allegra (store brands are fine) once daily. Do not take "D" versions which contain decongestant as these will raise your blood pressure. If you aren't getting adequate relief from taking this medication daily, then you can start using a nasal steroid spray such as Flonase or Nasacort. These are available at the pharmacy without a prescription.  You need to use 2 sprays into each nostril every day, and it can take up to 1.5 weeks to see the full effect.  You need to continue to use this spray daily.  Overlap the spray with the claritin/alllegra for the first week, and then try stopping the pills. If you need to continue both medications longterm (the pill and the spray) that is completely safe.  I think you would also benefit from using Mucinex regularly.  I recommend taking the 12 hour plain mucinex twice daily.  The active ingredient is guaifenesin, which is an expectorant to help keep the phlegm and mucus thin, which helps it drain better and decrease the cough. You may not need to use this long-term, just as needed for the cough, use regularly initially for a week or so.  It is safe to use long-term if needed.

## 2016-11-09 ENCOUNTER — Encounter: Payer: Self-pay | Admitting: Family Medicine

## 2016-11-09 ENCOUNTER — Ambulatory Visit (INDEPENDENT_AMBULATORY_CARE_PROVIDER_SITE_OTHER): Payer: Medicare Other | Admitting: Family Medicine

## 2016-11-09 VITALS — BP 140/70 | HR 84 | Ht 59.0 in | Wt 135.0 lb

## 2016-11-09 DIAGNOSIS — G4700 Insomnia, unspecified: Secondary | ICD-10-CM | POA: Diagnosis not present

## 2016-11-09 DIAGNOSIS — J309 Allergic rhinitis, unspecified: Secondary | ICD-10-CM | POA: Diagnosis not present

## 2016-11-09 DIAGNOSIS — F418 Other specified anxiety disorders: Secondary | ICD-10-CM | POA: Diagnosis not present

## 2016-11-09 DIAGNOSIS — R05 Cough: Secondary | ICD-10-CM

## 2016-11-09 DIAGNOSIS — R059 Cough, unspecified: Secondary | ICD-10-CM

## 2016-11-09 NOTE — Patient Instructions (Signed)
  I recommend that you continue the alprazolam at bedtime if you are having trouble falling asleep.  You can also use it at bedtime if you haven't slept well for 1-2 nights in a row.  If you fall asleep easily on your own, but wake up and don't sleep much that night, consider using it the next night, to get a better night sleep and feel more rested. The goal is to use it every other day or less often, not nightly. If you find that you are having significant daytime anxiety, then a preventative medication such as lexapro would be in order.  It doesn't seem that this is appropriate at this time, but if anxiety isn't improving over the next month, may be started at a follow-up visit. I'd recommend that you schedule counseling for when you return from your trip. I really want you to work on some relaxation techniques so that when you wake up and your mind is "going", you can try to relax and calm your nerves without a medication.  Counseling should help with developing these techniques.  We discussed a few relaxation techniques today--try these when you awaken in the middle of the night or whenever you feel anxious. We briefly mentioned grounding techniques for daytime anxiety.  If you insomnia is persistent, and you truly need medication daily, then we need to revisit Belsomra at higher doses, or look at some of the other medications that also might have some risk in the elderly (ie the ones that are recommended/covered by your insurance)  Don't be too afraid of the alprazolam.  While it isn't the safest medication for you to use regularly, to use it sporadically as suggested will work well.  I think the medication is creating significant anxiety for you, so let's try and work on non-medicinal relaxation techniques.  Consider looking into meditation tapes.  Start the mucinex and the claritin as discussed at last visit.  Use it daily, as directed for at least 2 weeks.  If your cough is better, you can  continue, or see if it is better and no longer needed (your choice--no harm in taking it longer, but at some point you might want to see if you still need it). Restart if/when your symptoms recur (postnasal drip and cough).

## 2016-11-09 NOTE — Progress Notes (Signed)
Chief Complaint  Patient presents with  . Anxiety    took xanax and slept well, finds that when she doesn't she doesn't sleep well. Wants to talk about something preventative.     Patient presents to further discuss her anxiety.  Upon entering the exam room, she tells me that she hasn't started the claritin--just got today Hasn't gotten the mucinex yet. Cough isn't as bad as at last visit. Asking if I still recommended these things (that were discussed in detail and recommended at her visit 8/8, which included eval for cough).  She has been using alprazolam, which has been helpful for her sleep, but she admits that she is very scared to be taking it.  She took one on Saturday night, slept well.  Didn't sleep last night, when she didn't take any alprazolam. She fell asleep okay, woke up at 3:30am.  Had some sleepytime tea, read a little, and went back to bed, thinks she slept for another couple of hours, woken up by her alarm at 8am.  Went to her exercise class, felt a little anxious, but not too bad.  Afterwards she felt "headachey" and her right knee bothered her some, relieved by advil she took after lunch.  She is again asking about Lexapro for prevention, as she is scared of alprazolam.  She won't be able to start any counseling prior to leaving for [place].  PMH, PSH, SH reviewed  Outpatient Encounter Prescriptions as of 11/09/2016  Medication Sig Note  . ALPRAZolam (XANAX) 0.25 MG tablet TK 1/2 TO 1 T PO DURING THE DAY FOR EXTREME ANXIETY AND 1-2 TS AT BEDTIME PRN FOR SLEEP   . atorvastatin (LIPITOR) 20 MG tablet TAKE 1 TABLET BY MOUTH  DAILY   . Biotin 5000 MCG TABS Take 1 tablet by mouth daily.   . Calcium Carbonate-Vitamin D (CALTRATE 600+D) 600-400 MG-UNIT per tablet Take 1 tablet by mouth daily.     Marland Kitchen Cod Liver Oil 1000 MG CAPS Take 1 capsule by mouth daily. 10/29/2016: She started this 2 days ago  . Multiple Vitamins-Minerals (CENTRUM PO) Take 1 tablet by mouth daily.     Marland Kitchen ibuprofen  (ADVIL,MOTRIN) 200 MG tablet Take 200 mg by mouth as needed. 10/29/2016: Uses 1-2/d as needed   No facility-administered encounter medications on file as of 11/09/2016.    No Known Allergies  ROS: no fever, chills.  +intermittent dry cough. +anxiety. Denies depression.  +insomnia.  No bleeding, bruising, rash.  Headache earlier today, resolve.d  Knee pain resolved with ibuprofen. Denies swelling or other joint pains.  See HPI  PHYSICAL EXAM:  BP 140/70 (BP Location: Right Arm, Patient Position: Sitting, Cuff Size: Normal)   Pulse 84   Ht 4\' 11"  (1.499 m)   Wt 135 lb (61.2 kg)   BMI 27.27 kg/m   Talkative female, repeating many questions asked last week.  She appears somewhat anxious. She has normal eye contact, speech, hygiene and grooming.  Somewhat disjointed in thoughts, with many questions over a few different topics (allergies/medications). She overall appears to be in good spirits, smiling, laughing intermittently.  Remainder of visited was spent counseling, limited exam.   ASSESSMENT/PLAN:  Insomnia, unspecified type  Situational anxiety  Allergic rhinitis, unspecified seasonality, unspecified trigger  Cough   She is anxious about all medications, especially those considered "risky" for elderly, which can affect memory, etc.  We discussed meds for insomnia, the safest for elderly being Belsomra, but the expense is keeping her from further interest in  trying the higher doses.  10mg  wasn't effective.   We discussed preventative meds in detail, the length of time they can take to work, the potential side effects, including increased anxiety initially.  Since she doesn't have much daytime anxiety, mainly affecting her sleep, I didn't recommend this at the current time.  Onset of sx was fairly recent as well.  I recommended holding off on starting SSRI until she is back from [place], when she can also get counseling to help bridge with the start of meds. Reviewed potential risks/side  effects and prn use of alprazolam, vs not using anything, having worse sleep, which can affect coping skills and moods as they did before.   Specifically taught some relaxation techniques, encouraged her to look into meditation vs other relaxation techniques to help when her mind is keeping her awake at night.   40 min visit, all spent counseling.  Encouraged to start the claritin and mucinex as previously recommended at visit last week.    I recommend that you continue the alprazolam at bedtime if you are having trouble falling asleep.  You can also use it at bedtime if you haven't slept well for 1-2 nights.  If you fall asleep easily on your own, but wake up and don't sleep much that night, consider using it the next night, to get a better night sleep and feel more rested. The goal is to use it every other day or less, not nightly. If you find that you are having significant daytime anxiety, then a preventative medication such as lexapro would be in order. I really want you to work on some relaxation techniques so that when you wake up and your mind is "going", you can try to relax and calm your nerves without a medication.  Counseling should help with developing these techniques.  We discussed a few relaxation techniques today--try these when you awaken in the middle of the night or whenever you feel anxious. We briefly mentioned grounding techniques for daytime anxiety.  If you insomnia is persistent, and you truly need medication daily, then we need to revisit Belsomra at higher doses, or look at some of the other medications that also might have some risk in the elderly.  Don't be too afraid of the alprazolam.  While it isn't the safest medication for you to use regularly, to use it sporadically as suggested will work well.  I think the medication is creating significant anxiety for you, so let's try and work on non-medicinal relaxation techniques.  Consider looking into meditation  tapes.  Start the mucinex and the claritin as discussed at last visit.  Use it daily, as directed for at least 2 weeks.  If your cough is better, you can continue, or see if it is better and no longer needed (your choice--no harm in taking it longer, but at some point you might want to see if you still need it). Restart if/when your symptoms recur (postnasal drip and cough).

## 2016-12-02 ENCOUNTER — Telehealth: Payer: Self-pay | Admitting: Family Medicine

## 2016-12-02 NOTE — Telephone Encounter (Signed)
  Patient called to let you know that [employer]  May contact you because she had to change her flight to come home earlier  Due to her situational anxiety   Patient asked that [place] call her, especially if [employer] contacts the office

## 2016-12-16 ENCOUNTER — Other Ambulatory Visit: Payer: Self-pay | Admitting: Family Medicine

## 2016-12-22 NOTE — Progress Notes (Signed)
Chief Complaint  Patient presents with  . Follow-up    follow up on anxiety and labs.  . Flu Vaccine    will get at [facility]    Patient presents to f/u on her anxiety and insomnia, as well as her cholesterol.  (scheduled for med check next week--she came earlier this morning to have fasting labs drawn).  Hyperlipidemia:  She switched from Crestor 20mg  to simvastatin 20mg  in June 2017, due to cost. This was done by her doctor in [facility]. This was later changed to atorvastatin (in October 2017, by me) after labs showed higher cholesterol compared to when on the Crestor (atorvastatin and simvastatin were same price, so she was switched to the stronger statin).  Lipids were last checked in [facility] in March (see scanned labs), along with full lab panel which was all okay. She is due for recheck today. She is following a low cholesterol diet. She denies any side effects to medications.  She is anxious about all medications, especially those considered "risky" for elderly, which can affect memory, etc.  We previously discussed meds for insomnia, the safest for elderly being Belsomra, but the expense is keeping her from further interest in trying the higher doses.  10mg  wasn't effective.   We discussed preventative meds for anxiety in detail at her last visit (8/13),per her request-- the length of time they can take to work, the potential side effects, including increased anxiety initially.  Since she doesn't have much daytime anxiety, mainly affecting her sleep, I didn't recommend this at the current time.  Onset of sx was fairly recent as well, only related to stress of selling house/moving.  I recommended holding off on starting SSRI until she returned from [place], when she can also get counseling to help bridge with the start of meds. She presents to discuss this today. She reports she used some of the breathing relaxation techniques taught at last visit, which helped some. She canceled her trip to [place]. She  went to [place]. She came home 9/9. She did "pretty well" while away, some nights slept through the night.  She took alprazolam about every 4-5 nights to sleep.  She reports feeling very anxious, worse at night.  She worries a lot at night. 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 She sold her house, doesn't have anywhere to move yet. She has to be out of her house by 10/14.  This is causing her a great deal of worry. She is quite insistent about starting a preventative medication such as lexapro today. She hasn't called anyone for counseling yet.  She is busy packing.  She woke up at 2 am with a headache the last couple of times she took the alprazolam. Tylenol Arthritis helped. She also has ultram from the orthopedist, which she thinks helps her anxiety. She has many questions about these medications and which she should take. She just started Visco injections to the right knee, which have been somewhat helpful so far.   Last week at 9:30 pm she had pain across her entire chest.  It resolved within an hour.  She had gone out to dinner that night.  She denies any exertion, radiation, or other associated symptoms. She did get quite worried that she could be having a heart attack, and called her daughter and neighbor but didn't reach anyone. It resolved within an hour without any treatment.   PMH, PSH, SH reviewed  Outpatient Encounter Prescriptions as of 12/23/2016  Medication Sig Note  .  ALPRAZolam (XANAX) 0.25 MG tablet TK 1/2 TO 1 T PO DURING THE DAY FOR EXTREME ANXIETY AND 1-2 TS AT BEDTIME PRN FOR SLEEP   . atorvastatin (LIPITOR) 20 MG tablet TAKE 1 TABLET BY MOUTH  DAILY   . Calcium Carbonate-Vitamin D (CALTRATE 600+D) 600-400 MG-UNIT per tablet Take 1 tablet by mouth daily.     Marland Kitchen Cod Liver Oil 1000 MG CAPS Take 1 capsule by mouth daily. 10/29/2016: She started this 2 days ago  . Multiple Vitamins-Minerals (CENTRUM PO) Take 1 tablet by mouth daily.     . traMADol (ULTRAM) 50 MG tablet Take 50 mg by  mouth as needed.   . Acetaminophen (TYLENOL ARTHRITIS PAIN PO) Take 2 tablets by mouth as needed.   Marland Kitchen escitalopram (LEXAPRO) 10 MG tablet Take 1/2 tablet by mouth once daily. Increase to full tablet after 1-2 weeks, if tolerated   . ibuprofen (ADVIL,MOTRIN) 200 MG tablet Take 200 mg by mouth as needed. 10/29/2016: Uses 1-2/d as needed  . [DISCONTINUED] Biotin 5000 MCG TABS Take 1 tablet by mouth daily.    No facility-administered encounter medications on file as of 12/23/2016.    (lexapro started today, not taking prior to visit).  No Known Allergies  ROS: no fever, chills, headaches (just the last couple of times she took alprazolam, woke up with headache, unsure if related).  Denies dizziness, syncope.  Only the one episode of non-exertional chest pain as described above, in the evening while still sitting upright, shortly after going out to dinner.  Denies nausea, vomiting, heartburn, bowel changes, bleeding, bruising, rash, edema. +anxiety. Denies depression. Knee pain somewhat improved after injection. See HPI.    PHYSICAL EXAM:  BP 140/80 (BP Location: Left Arm, Patient Position: Sitting, Cuff Size: Normal)   Pulse 84   Ht 4\' 11"  (1.499 m)   Wt 131 lb (59.4 kg)   BMI 26.46 kg/m    Wt Readings from Last 3 Encounters:  12/23/16 131 lb (59.4 kg)  11/09/16 135 lb (61.2 kg)  11/04/16 135 lb 12.8 oz (61.6 kg)   Very talkative female, switching from topic to topic asking many questions. She is smiling, and appears to be in good spirits today. She is somewhat apologetic when she digresses and is re-directed back to my finishing the answer to her last question HEENT: conjunctiva and sclera are clear, EOMI Neck: no lymphadenopathy, thyromegaly or mass Heart: regular rate and rhythm Lungs: clear bilaterally Chest: nontender Abdomen: soft, nontender, no mass Extremities: no edema Neuro: alert and oriented, cranial nerves intact, normal gait Psych: anxious, full range of affect. Normal  hygiene, grooming, eye contact  GAD-7 score of 10  ASSESSMENT/PLAN:  Situational anxiety - related to selling house/moving. Fairly insistent on starting preventative med, hesitant about counseling--strongly encouraged. Risks/SE reviewed - Plan: escitalopram (LEXAPRO) 10 MG tablet  Pure hypercholesterolemia - Plan: Lipid panel  Insomnia, unspecified type - somewhat improved; related ot anxiety; not needing daily med, just prn alprazolam  Long-term use of high-risk medication - Plan: Hepatic Function Panel  Chest pain, unspecified type - one episode, self resolved, after large meal. suspect GERD/GI component. Counseled re: diet, OTC meds. f/u if any recurrent pain   Counseled extensively re: risks/side effects and potential increase in anxiety related to SSRI, how long it takes to be effective, and recommendation to seek counseling ASAP to help with her stressors/coping skills.  Lipids, LFT's today. Atorvastatin was just refilled 9/19 #90  All questions were answered (often more than once). 40-45 min  visit, more than 1/2 spent counseling.    It is hard to say what caused your chest pain the other night.  It occurred after going out to dinner, so consider that it could have been related to reflux.  Next time you have chest pain after a meal, consider trying Mylanta, Maalox or Pepcid Complete.  Pay attention to what you are eating, certain foods will trigger this more than others--see handout.  Start 1/2 tablet of lexapro daily.  Take it just once in the morning.  If it makes you feel sleepy, you can change it to bedtime dosing.  You may have some increase in anxiety temporarily.  Other side effects include headache and stomach upset.  These are usually temporary, and improve within a week.  Stay at the 1/2 dose for at least a week, longer if you are still having side effects. If no side effects at a week, you can increase to the full tablet.  You may use the alprazolam if needed to help  with any worsening anxiety while this is being initiated.  Do not take the alprazolam and tramadol together, as they will make you too sleepy and put you at risk for injury.  Use Tramadol sparingly, only for significant pain (not for sleep!)  I strongly encourage you to get counseling. Much of the stress and anxiety you are having is all situational related to your move.  Usually this is not necessarily treated with long-term medications, but given the severity of your anxiety, we are starting this today.  Please call  Marya Amsler or any of her colleagues) to set this up as soon as possible.  Return in 4-6 weeks to follow up on your anxiety. Continue to make sure that eat regularly, even if you aren't feeling particularly hungry (don't skip meals). You have lost some weight, and we will be keeping an eye on this (much of your weight loss occurred prior to you being seen here in August, sometime after 01/2016).

## 2016-12-23 ENCOUNTER — Ambulatory Visit (INDEPENDENT_AMBULATORY_CARE_PROVIDER_SITE_OTHER): Payer: Medicare Other | Admitting: Family Medicine

## 2016-12-23 ENCOUNTER — Encounter: Payer: Self-pay | Admitting: Family Medicine

## 2016-12-23 VITALS — BP 140/80 | HR 84 | Ht 59.0 in | Wt 131.0 lb

## 2016-12-23 DIAGNOSIS — R079 Chest pain, unspecified: Secondary | ICD-10-CM

## 2016-12-23 DIAGNOSIS — G4700 Insomnia, unspecified: Secondary | ICD-10-CM

## 2016-12-23 DIAGNOSIS — Z79899 Other long term (current) drug therapy: Secondary | ICD-10-CM

## 2016-12-23 DIAGNOSIS — F418 Other specified anxiety disorders: Secondary | ICD-10-CM | POA: Diagnosis not present

## 2016-12-23 DIAGNOSIS — E7800 Pure hypercholesterolemia, unspecified: Secondary | ICD-10-CM | POA: Diagnosis not present

## 2016-12-23 MED ORDER — ESCITALOPRAM OXALATE 10 MG PO TABS: ORAL_TABLET | ORAL | 1 refills | Status: DC

## 2016-12-23 NOTE — Patient Instructions (Addendum)
It is hard to say what caused your chest pain the other night.  It occurred after going out to dinner, so consider that it could have been related to reflux.  Next time you have chest pain after a meal, consider trying Mylanta, Maalox or Pepcid Complete.  Pay attention to what you are eating, certain foods will trigger this more than others--see handout.  Start 1/2 tablet of lexapro daily.  Take it just once in the morning.  If it makes you feel sleepy, you can change it to bedtime dosing.  You may have some increase in anxiety temporarily.  Other side effects include headache and stomach upset.  These are usually temporary, and improve within a week.  Stay at the 1/2 dose for at least a week, longer if you are still having side effects. If no side effects at a week, you can increase to the full tablet.  You may use the alprazolam if needed to help with any worsening anxiety while this is being initiated.  Do not take the alprazolam and tramadol together, as they will make you too sleepy and put you at risk for injury.  Use Tramadol sparingly, only for significant pain (not for sleep!)  I strongly encourage you to get counseling. Much of the stress and anxiety you are having is all situational related to your move.  Usually this is not necessarily treated with long-term medications, but given the severity of your anxiety, we are starting this today.  Please call [place] Marya Amsler or any of her colleagues) to set this up as soon as possible.  Return in 4-6 weeks to follow up on your anxiety. Continue to make sure that eat regularly, even if you aren't feeling particularly hungry (don't skip meals). You have lost some weight, and we will be keeping an eye on this (much of your weight loss occurred prior to you being seen here in August, sometime after 01/2016).   Food Choices for Gastroesophageal Reflux Disease, Adult When you have gastroesophageal reflux disease (GERD), the foods you eat and your  eating habits are very important. Choosing the right foods can help ease the discomfort of GERD. Consider working with a diet and nutrition specialist (dietitian) to help you make healthy food choices. What general guidelines should I follow? Eating plan  Choose healthy foods low in fat, such as fruits, vegetables, whole grains, low-fat dairy products, and lean meat, fish, and poultry.  Eat frequent, small meals instead of three large meals each day. Eat your meals slowly, in a relaxed setting. Avoid bending over or lying down until 2-3 hours after eating.  Limit high-fat foods such as fatty meats or fried foods.  Limit your intake of oils, butter, and shortening to less than 8 teaspoons each day.  Avoid the following: ? Foods that cause symptoms. These may be different for different people. Keep a food diary to keep track of foods that cause symptoms. ? Alcohol. ? Drinking large amounts of liquid with meals. ? Eating meals during the 2-3 hours before bed.  Cook foods using methods other than frying. This may include baking, grilling, or broiling. Lifestyle   Maintain a healthy weight. Ask your health care provider what weight is healthy for you. If you need to lose weight, work with your health care provider to do so safely.  Exercise for at least 30 minutes on 5 or more days each week, or as told by your health care provider.  Avoid wearing clothes that fit tightly  around your waist and chest.  Do not use any products that contain nicotine or tobacco, such as cigarettes and e-cigarettes. If you need help quitting, ask your health care provider.  Sleep with the head of your bed raised. Use a wedge under the mattress or blocks under the bed frame to raise the head of the bed. What foods are not recommended? The items listed may not be a complete list. Talk with your dietitian about what dietary choices are best for you. Grains Pastries or quick breads with added fat. [place]  toast. Vegetables Deep fried vegetables. [place] fries. Any vegetables prepared with added fat. Any vegetables that cause symptoms. For some people this may include tomatoes and tomato products, chili peppers, onions and garlic, and horseradish. Fruits Any fruits prepared with added fat. Any fruits that cause symptoms. For some people this may include citrus fruits, such as oranges, grapefruit, pineapple, and lemons. Meats and other protein foods High-fat meats, such as fatty beef or pork, hot dogs, ribs, ham, sausage, salami and bacon. Fried meat or protein, including fried fish and fried chicken. Nuts and nut butters. Dairy Whole milk and chocolate milk. Sour cream. Cream. Ice cream. Cream cheese. Milk shakes. Beverages Coffee and tea, with or without caffeine. Carbonated beverages. Sodas. Energy drinks. Fruit juice made with acidic fruits (such as orange or grapefruit). Tomato juice. Alcoholic drinks. Fats and oils Butter. Margarine. Shortening. Ghee. Sweets and desserts Chocolate and cocoa. Donuts. Seasoning and other foods Pepper. Peppermint and spearmint. Any condiments, herbs, or seasonings that cause symptoms. For some people, this may include curry, hot sauce, or vinegar-based salad dressings. Summary  When you have gastroesophageal reflux disease (GERD), food and lifestyle choices are very important to help ease the discomfort of GERD.  Eat frequent, small meals instead of three large meals each day. Eat your meals slowly, in a relaxed setting. Avoid bending over or lying down until 2-3 hours after eating.  Limit high-fat foods such as fatty meat or fried foods. This information is not intended to replace advice given to you by your health care provider. Make sure you discuss any questions you have with your health care provider. Document Released: 03/16/2005 Document Revised: 03/17/2016 Document Reviewed: 03/17/2016 Elsevier Interactive Patient Education  2017 [employer].

## 2016-12-24 LAB — LIPID PANEL
CHOL/HDL RATIO: 1.9 (calc) (ref <5.0)
CHOLESTEROL: 175 mg/dL (ref <200)
HDL: 91 mg/dL (ref >50)
LDL CHOLESTEROL (CALC): 65 mg/dL
Non-HDL Cholesterol (Calc): 84 mg/dL (calc) (ref <130)
Triglycerides: 108 mg/dL (ref <150)

## 2016-12-24 LAB — HEPATIC FUNCTION PANEL
AG RATIO: 2 (calc) (ref 1.0–2.5)
ALKALINE PHOSPHATASE (APISO): 67 U/L (ref 33–130)
ALT: 59 U/L — AB (ref 6–29)
AST: 63 U/L — AB (ref 10–35)
Albumin: 4.3 g/dL (ref 3.6–5.1)
BILIRUBIN DIRECT: 0.2 mg/dL (ref 0.0–0.2)
BILIRUBIN TOTAL: 1.1 mg/dL (ref 0.2–1.2)
Globulin: 2.2 g/dL (calc) (ref 1.9–3.7)
Indirect Bilirubin: 0.9 mg/dL (calc) (ref 0.2–1.2)
Total Protein: 6.5 g/dL (ref 6.1–8.1)

## 2016-12-30 ENCOUNTER — Telehealth: Payer: Self-pay | Admitting: Family Medicine

## 2016-12-30 ENCOUNTER — Ambulatory Visit: Payer: Medicare Other | Admitting: Family Medicine

## 2016-12-30 NOTE — Telephone Encounter (Signed)
Patient advised, she cannot afford the Belsomra as she said the rx is over $400 per month. She will do as instructed and keep you posted.

## 2016-12-30 NOTE — Telephone Encounter (Signed)
The alprazolam is not intended to be used as a sleep aid.  It is used to treat anxiety, which sometimes interferes with her being able to fall asleep.  This is a risky drug for someone her age, and it did its job to help her fall asleep.  I do NOT recommend a higher dose to get a better night's sleep.  Her sleep should improve as her anxiety improves, which takes up to 4-6 weeks on the medication.  Her other option for sleep would be to re-try the Belsomra at a higher dose (believe she tried it at lower dose, wasn't effective and didn't increase). That is specifically for sleep, and safer for her.  Continue the lexapro at the full tablet as instructed. She can return sooner than her November appointment if having further problems/side effects/issues.    Please ensure that she has contacted [place] for counseling as I strongly recommended at her visit.

## 2016-12-30 NOTE — Telephone Encounter (Signed)
Pt called and left message that she took the alprazolam at 9:30 and was asleep around 10, she woke at 2am and had difficulty sleeping after that.  Maybe she needs a higher dose?  Also took a full tablet of Lexapro this morning.  Still having a lot of anxiety issues with the sell of her home, etc.  Pt wants a call from Dr. Tomi Bamberger or Verdene Lennert [phone]

## 2017-01-05 ENCOUNTER — Telehealth: Payer: Self-pay | Admitting: Family Medicine

## 2017-01-05 NOTE — Telephone Encounter (Signed)
Pt called and left message that she cannot get into [place] [facility] until December and she is not sure if she has coverage for this or not.  Please call pt [phone]

## 2017-01-06 NOTE — Telephone Encounter (Signed)
Patient Alexis Hines will call back and schedule appt.

## 2017-01-06 NOTE — Telephone Encounter (Signed)
She would need to check with her insurance regarding coverage.  It is a shame she didn't make this call when initially recommended, over a month ago.  Any of her partners within that practice should also be fine.

## 2017-01-06 NOTE — Telephone Encounter (Signed)
This was sent to me, anything thing you want me to tell her?

## 2017-02-01 ENCOUNTER — Telehealth: Payer: Self-pay | Admitting: *Deleted

## 2017-02-01 DIAGNOSIS — R7989 Other specified abnormal findings of blood chemistry: Secondary | ICD-10-CM

## 2017-02-01 DIAGNOSIS — R945 Abnormal results of liver function studies: Principal | ICD-10-CM

## 2017-02-01 NOTE — Telephone Encounter (Signed)
Patient coming in [facility] for med check and recheck on liver. Wants to know is she can come in Wed at 9am to have the liver lab done so she can have the results to go over with you on Thurs. Please advise and let me know of you would like any other labs if so.

## 2017-02-01 NOTE — Telephone Encounter (Signed)
Patient advised and put on schedule for Wed.

## 2017-02-01 NOTE — Telephone Encounter (Signed)
Future order entered for liver tests. Not sure if anything else needed, but that is what she is worried about, so that's what we will do prior to visit.

## 2017-02-01 NOTE — Addendum Note (Signed)
Addended byRita Ohara on: 02/01/2017 02:27 PM   Modules accepted: Orders

## 2017-02-03 ENCOUNTER — Other Ambulatory Visit: Payer: Self-pay | Admitting: Family Medicine

## 2017-02-03 ENCOUNTER — Other Ambulatory Visit (INDEPENDENT_AMBULATORY_CARE_PROVIDER_SITE_OTHER): Payer: Medicare Other

## 2017-02-03 DIAGNOSIS — Z23 Encounter for immunization: Secondary | ICD-10-CM

## 2017-02-03 DIAGNOSIS — R7989 Other specified abnormal findings of blood chemistry: Secondary | ICD-10-CM

## 2017-02-03 DIAGNOSIS — R945 Abnormal results of liver function studies: Secondary | ICD-10-CM

## 2017-02-03 LAB — HEPATIC FUNCTION PANEL
AG RATIO: 2 (calc) (ref 1.0–2.5)
ALBUMIN MSPROF: 4.1 g/dL (ref 3.6–5.1)
ALKALINE PHOSPHATASE (APISO): 60 U/L (ref 33–130)
ALT: 22 U/L (ref 6–29)
AST: 29 U/L (ref 10–35)
Bilirubin, Direct: 0.2 mg/dL (ref 0.0–0.2)
GLOBULIN: 2.1 g/dL (ref 1.9–3.7)
Indirect Bilirubin: 0.6 mg/dL (calc) (ref 0.2–1.2)
TOTAL PROTEIN: 6.2 g/dL (ref 6.1–8.1)
Total Bilirubin: 0.8 mg/dL (ref 0.2–1.2)

## 2017-02-03 NOTE — Progress Notes (Signed)
Chief Complaint  Patient presents with  . Anxiety    nonfasting med check.    She is here to follow up on her anxiety.  She was started on lexapro 6 weeks ago.  "I think it has really helped me".  She increased to the full tablet after 1 week. She denies any side effects. She is sleeping much better. She never made appt with therapist (wasn't going to be until December). She only rarely needs the alprazolam, if she has a lot going on the next day and has trouble shutting down her mind.  Hasn't taken one in 2 weeks. She has been slightly "headachey" more often than normal, just mild and no trelated to changes in dose of medicationh.  Buyer for her house fell through, it is now back on the market. She is renting a place now, is going to try and buy it when her house sells.  She is also here to f/u on her mildly elevated LFT's.  She had liver tests rechecked prior to her visit today. See below.  Last tests: Lab Results  Component Value Date   ALT 59 (H) 12/23/2016   AST 63 (H) 12/23/2016   ALKPHOS 63 12/26/2015   BILITOT 1.1 12/23/2016   Sees dermatologist, [facility] They noted a red spot on her left jaw, and she was prescribed cloderm BID.  If it doesn't resolve, they plan to biopsy it.  She thinks it has gotten a little smaller. She has h/o BCC  She continues to have some right knee pain. Sees Dr. Theda Sers, and may ultimately need surgery. Shots didn't help (Visco injections), but pain is tolerable. She still goes to her exercise classes.  Plans to leave for [facility] in December. She gets her yearly physical/wellness visits with her PCP there.  PMH, PSH, SH reviewed  Outpatient Encounter Medications as of 02/04/2017  Medication Sig Note  . ALPRAZolam (XANAX) 0.25 MG tablet TK 1/2 TO 1 T PO DURING THE DAY FOR EXTREME ANXIETY AND 1-2 TS AT BEDTIME PRN FOR SLEEP   . atorvastatin (LIPITOR) 20 MG tablet TAKE 1 TABLET BY MOUTH  DAILY   . Calcium Carbonate-Vitamin D (CALTRATE  600+D) 600-400 MG-UNIT per tablet Take 1 tablet by mouth daily.     Marland Kitchen Clocortolone Pivalate (CLODERM) 0.1 % cream Apply 1 application 2 (two) times daily topically.   Marland Kitchen Cod Liver Oil 1000 MG CAPS Take 1 capsule by mouth daily. 10/29/2016: She started this 2 days ago  . escitalopram (LEXAPRO) 10 MG tablet Take 1 tablet (10 mg total) daily by mouth.   Marland Kitchen ibuprofen (ADVIL,MOTRIN) 200 MG tablet Take 200 mg by mouth as needed. 10/29/2016: Uses 1-2/d as needed  . Multiple Vitamins-Minerals (CENTRUM PO) Take 1 tablet by mouth daily.     . traMADol (ULTRAM) 50 MG tablet Take 50 mg by mouth as needed.   . [DISCONTINUED] escitalopram (LEXAPRO) 10 MG tablet Take 1/2 tablet by mouth once daily. Increase to full tablet after 1-2 weeks, if tolerated 02/04/2017: 1 tablet daily  . Acetaminophen (TYLENOL ARTHRITIS PAIN PO) Take 2 tablets by mouth as needed.    No facility-administered encounter medications on file as of 02/04/2017.    No Known Allergies  ROS: occasional headaches, no fever, chills, URI symptoms, dizziness, chest pain, GI or GU complaints.  Anxiety and insomnia is improved.  Knee pain per HPI.     PHYSICAL EXAM:  BP 128/76   Pulse 60   Ht 4\' 11"  (1.499 m)  Wt 129 lb 9.6 oz (58.8 kg)   BMI 26.18 kg/m   Well appearing, pleasant elderly female in no distress HEENT: conjunctiva and sclera are clear, EOMI Neck: no lymphadenopathy or mass Heart: regular rate and rhythm Lungs: clear bilaterally Abdomen: soft, nontender, no organomegaly or mass Extremities: no edema Psych: normal mood, affect, hygiene and grooming. Still very talkative, but seems to be in a better mood and less anxious Neuro: alert and oriented, cranial nerves intact, normal gait. Skin: small red area left lower jaw, smooth.  Lab Results  Component Value Date   ALT 22 02/03/2017   AST 29 02/03/2017   ALKPHOS 63 12/26/2015   BILITOT 0.8 02/03/2017   Lab Results  Component Value Date   CHOL 175 12/23/2016   HDL 91  12/23/2016   LDLCALC 121 12/26/2015   TRIG 108 12/23/2016   CHOLHDL 1.9 12/23/2016    ASSESSMENT/PLAN:  Elevated LFTs - improved; reviewed current and prior results, discussed Ddx and which things can affect the liver. Reassured. Continue current meds  Generalized anxiety disorder - somewhat situational, main sx insomnia. Improved w/ Lexapro with rare alprazolam prn. Cont current meds. Discussed long-term plan, not to stop abruptly. - Plan: escitalopram (LEXAPRO) 10 MG tablet  Pure hypercholesterolemia - at goal; continue current medication   Discussed continuing lexapro until stressors gone (may relate to any planned knee surgery if that is a potential option, and not just related to getting settled in her new house and selling old one). Discussed no rush to come off, and not to change without consulting me. Discussed plan for eventually cutting to 1/2 tablet for 1-2 months before deciding whether med can be stopped, but this will not happen prior to our next f/u visit in 6 mos.  [facility] She was given copy of labs to show him.   All questions answered.  25 min visit, more than 1/2 spent counseling.     Your anxiety seems much better since taking the lexapro.  Continue the current dose. If/when your stressors are much less (moved and settled into your new home, no health concerns or other worries) we can discuss how to gradually decrease the dose and possibly get off the medication.  Do not stop this abruptly.  F/u 6 months Gets wellnes visit in [facility]

## 2017-02-04 ENCOUNTER — Ambulatory Visit: Payer: Medicare Other | Admitting: Family Medicine

## 2017-02-04 ENCOUNTER — Encounter: Payer: Self-pay | Admitting: Family Medicine

## 2017-02-04 VITALS — BP 128/76 | HR 60 | Ht 59.0 in | Wt 129.6 lb

## 2017-02-04 DIAGNOSIS — R945 Abnormal results of liver function studies: Secondary | ICD-10-CM

## 2017-02-04 DIAGNOSIS — F411 Generalized anxiety disorder: Secondary | ICD-10-CM

## 2017-02-04 DIAGNOSIS — E7800 Pure hypercholesterolemia, unspecified: Secondary | ICD-10-CM

## 2017-02-04 DIAGNOSIS — R7989 Other specified abnormal findings of blood chemistry: Secondary | ICD-10-CM

## 2017-02-04 MED ORDER — ESCITALOPRAM OXALATE 10 MG PO TABS: 10.0000 mg | ORAL_TABLET | Freq: Every day | ORAL | 3 refills | Status: DC

## 2017-02-04 NOTE — Patient Instructions (Signed)
  Your anxiety seems much better since taking the lexapro.  Continue the current dose. If/when your stressors are much less (moved and settled into your new home, no health concerns or other worries) we can discuss how to gradually decrease the dose and possibly get off the medication.  Do not stop this abruptly.  Lab Results  Component Value Date   CHOL 175 12/23/2016   HDL 91 12/23/2016   LDLCALC 121 12/26/2015   TRIG 108 12/23/2016   CHOLHDL 1.9 12/23/2016   Lab Results  Component Value Date   ALT 59 (H) 12/23/2016   AST 63 (H) 12/23/2016   ALKPHOS 63 12/26/2015   BILITOT 1.1 12/23/2016   Lab Results  Component Value Date   ALT 22 02/03/2017   AST 29 02/03/2017   ALKPHOS 63 12/26/2015   BILITOT 0.8 02/03/2017

## 2017-02-10 ENCOUNTER — Telehealth: Payer: Self-pay | Admitting: Family Medicine

## 2017-02-10 DIAGNOSIS — F411 Generalized anxiety disorder: Secondary | ICD-10-CM

## 2017-02-10 MED ORDER — ESCITALOPRAM OXALATE 10 MG PO TABS: 10.0000 mg | ORAL_TABLET | Freq: Every day | ORAL | 3 refills | Status: DC

## 2017-02-10 NOTE — Telephone Encounter (Signed)
Done

## 2017-02-10 NOTE — Telephone Encounter (Signed)
Looks like prescription done at visit was sent to [facility]. Cancel that and change to [facility] if that is what pt wants

## 2017-02-10 NOTE — Telephone Encounter (Signed)
[facility] req refill Escitalopram tab #3

## 2017-02-15 ENCOUNTER — Other Ambulatory Visit: Payer: Self-pay

## 2017-02-15 DIAGNOSIS — C4492 Squamous cell carcinoma of skin, unspecified: Secondary | ICD-10-CM

## 2017-02-15 HISTORY — DX: Squamous cell carcinoma of skin, unspecified: C44.92

## 2017-02-17 ENCOUNTER — Telehealth: Payer: Self-pay | Admitting: *Deleted

## 2017-02-17 NOTE — Telephone Encounter (Signed)
Patient called and stated that she has been having L hand numbness on and off, she is right handed and this seems odd to her-should she concerned? Could this be heart related? Also, she was reading the insert for the lexapro and it says not to drink alcoholic beverages with this medication. With tomorrow being Thanksgiving she will most likely have a glass a wine, cocktail or a beer-is this a big problem? This is not something she would be doing daily, just occasionally or on a holiday.

## 2017-02-17 NOTE — Telephone Encounter (Signed)
Okay to have one alcoholic beverage tomorrow. There are lots of causes for hand numbness. The fact that it comes and goes is overall reassuring (as opposed to constant and worsening).  Have her pay attention to which fingers seem to go numb (usually not all 5, usually either the first three or the 3rd-5th). Also have her pay attention as to when they go numb--while driving, certain sleep positions, certain activities, etc.  She can come in to see me if the numbness is persisting/worsening, or if she is developing any weakness in the hand (hard time gripping things, dropping things, etc), and more urgently if she develops any other neurologic symptoms associated with it (weakness, slurring speech, mouth drooping, etc)

## 2017-02-17 NOTE — Telephone Encounter (Signed)
Patient advised.

## 2017-04-14 ENCOUNTER — Telehealth: Payer: Self-pay | Admitting: *Deleted

## 2017-04-14 NOTE — Telephone Encounter (Signed)
I recommend she cut the tablet in half.  Stay on the half tablet for a month, and if she is still doing very well, then she can cut back further to 1/2 tablet every other day for 1-2 weeks, and then stop entirely

## 2017-04-14 NOTE — Telephone Encounter (Signed)
Patient advised.

## 2017-04-14 NOTE — Telephone Encounter (Signed)
Patient sold her condo and is in [place] at the moment. She is doing much better and would like to get off the lexapro. Calling for instructions on how to do so.  Wants me to call her back at [phone].

## 2017-06-03 ENCOUNTER — Other Ambulatory Visit: Payer: Self-pay | Admitting: Family Medicine

## 2017-06-03 NOTE — Telephone Encounter (Signed)
Per 01/2017 visit- follow-up in 6 months. I will give med until May

## 2017-08-05 ENCOUNTER — Ambulatory Visit (INDEPENDENT_AMBULATORY_CARE_PROVIDER_SITE_OTHER): Payer: Medicare Other | Admitting: Family Medicine

## 2017-08-05 ENCOUNTER — Encounter: Payer: Self-pay | Admitting: Family Medicine

## 2017-08-05 VITALS — BP 126/72 | HR 80 | Ht 58.5 in | Wt 134.2 lb

## 2017-08-05 DIAGNOSIS — M25561 Pain in right knee: Secondary | ICD-10-CM | POA: Diagnosis not present

## 2017-08-05 DIAGNOSIS — Z5181 Encounter for therapeutic drug level monitoring: Secondary | ICD-10-CM

## 2017-08-05 DIAGNOSIS — E7800 Pure hypercholesterolemia, unspecified: Secondary | ICD-10-CM | POA: Diagnosis not present

## 2017-08-05 DIAGNOSIS — Z01818 Encounter for other preprocedural examination: Secondary | ICD-10-CM | POA: Diagnosis not present

## 2017-08-05 NOTE — Patient Instructions (Signed)
We will send a note clearing you for surgery. Best of luck, and an easy recovery.  Return for fasting labs, to ensure that your higher dose of lipitor is getting you to a good cholesterol without hurting your liver.

## 2017-08-05 NOTE — Progress Notes (Signed)
Chief Complaint  Patient presents with  . Medical Clearance    clearance for right knee surgery by Dr. Hillery Aldo. She does not have a form to be filled out yet but states that an office note should be okay. Brought in some records from [facility] and looks like she had EKG done last month. Also doctor in [place] from 20 to 40 based n most recent labs and she is asking repeat chol when she is fasting.    R total knee replacement is planned by Dr. Theda Sers.  He did her left knee in the past. She is here for surgical clearance letter.  She saw her primary care doctor in [place] in March, 2019.  He increased her atorvastatin dose to 38m from 266m She would like to have this rechecked. Denies side effects at the higher dose. She brings in copies of labs and studies, which are summarized below, and will be scanned into her chart.  Her LDL was 121 PLAC test for Lp-PLA2 was elevated. Due to this she reports she was sent for [place], which was normal. She also saw the cardiologist, and had stress test, knowing the expected upcoming knee surgery. Nuclear stress test was normal.  Other than her right knee pain, she reports she is doing quite well.  Her anxiety is much better. She sold her house, moved into new place, not quite settled yet. No further issues with anxiety.  Uses alprazolam maybe once a month if didn't sleep well.  Tramadol for knee pain, infrequently, is effective  PMH, PSH, SH reviewed  Outpatient Encounter Medications as of 08/05/2017  Medication Sig Note  . ALPRAZolam (XANAX) 0.25 MG tablet TK 1/2 TO 1 T PO DURING THE DAY FOR EXTREME ANXIETY AND 1-2 TS AT BEDTIME PRN FOR SLEEP 08/05/2017: Uses infrequently, about once a month  . atorvastatin (LIPITOR) 40 MG tablet Take 40 mg by mouth daily. 08/05/2017: Increased in March  . Calcium Carbonate-Vitamin D (CALTRATE 600+D) 600-400 MG-UNIT per tablet Take 1 tablet by mouth daily.     . Marland Kitchenbuprofen (ADVIL,MOTRIN) 200 MG tablet Take 200 mg  by mouth as needed. 10/29/2016: Uses 1-2/d as needed  . Multiple Vitamins-Minerals (CENTRUM PO) Take 1 tablet by mouth daily.     . traMADol (ULTRAM) 50 MG tablet Take 50 mg by mouth as needed. 08/05/2017: Rarely needs, 2x/month (for knee pain)  . [DISCONTINUED] Acetaminophen (TYLENOL ARTHRITIS PAIN PO) Take 2 tablets by mouth as needed.   . [DISCONTINUED] atorvastatin (LIPITOR) 20 MG tablet TAKE 1 TABLET BY MOUTH  DAILY   . [DISCONTINUED] Clocortolone Pivalate (CLODERM) 0.1 % cream Apply 1 application 2 (two) times daily topically.   . [DISCONTINUED] Cod Liver Oil 1000 MG CAPS Take 1 capsule by mouth daily. 10/29/2016: She started this 2 days ago  . [DISCONTINUED] escitalopram (LEXAPRO) 10 MG tablet Take 1 tablet (10 mg total) daily by mouth.    No facility-administered encounter medications on file as of 08/05/2017.    No Known Allergies  ROS: The patient denies fever, weight changes, headaches,  vision changes, decreased hearing, ear pain, sore throat, breast concerns, chest pain, palpitations, dizziness, syncope, dyspnea on exertion, cough, swelling, nausea, vomiting, diarrhea, constipation, abdominal pain, melena, hematochezia, indigestion/heartburn, hematuria, incontinence, dysuria, vaginal discharge, odor or itch. She denies numbness, tingling, weakness, tremor, suspicious skin lesions, depression, anxiety, abnormal bleeding, or enlarged lymph nodes. R knee pain, no other joint pains.  Slight bruising from cleaning, NSAID use.  PHYSICAL EXAM:  BP 126/72  Pulse 80   Ht 4' 10.5" (1.486 m)   Wt 134 lb 3.2 oz (60.9 kg)   BMI 27.57 kg/m   Well-appearing, pleasant female, in good spirits, in no distress HEENT: conjunctiva and sclera clear, EOMI, OP clear. Neck: no lymphadenopathy, thyromegaly or mass, no carotid bruit Heart: regular rate and rhythm, no murmur Lungs: clear bilaterally, no wheezes, rales ronchi Back: no spinal or CVA tenderness Extremities: no edema, normal pulses Psych:  normal mood, affect, hygiene, grooming, speech, eye contact Neuro: alert and oriented, cranial nerves intact, normal gait, strength   Records reviewed-- Nuclear stress test 07/05/2017: negative Lexiscan stress test, normal nuclear images without defect.  Carotid US 06/16/17: no hemodynamically significant lesions  Labs 06/09/17: TSH  0.985 CK 110.0 Lipids (when on 63m atorvastatin) TC 205, HDL 70, LDL 121, TG 148 Hs-CRP 0.74 PLAC test for Lp-PLA2 185.4 (>133 is high) Normal CBC, Hg 15.1 Normal c-met, glu 91, Cr 0.78, normal LFT's Normal urine dip    ASSESSMENT/PLAN:  Pure hypercholesterolemia - atorvastatin dose increased in March, due for recheck of lipids and LFT's. To return for fasting labs. Tolerating without side effects - Plan: Lipid panel  Medication monitoring encounter - Plan: Lipid panel, Hepatic function panel  Right knee pain, unspecified chronicity  Pre-operative clearance  Cleared for surgery.

## 2017-08-09 ENCOUNTER — Encounter: Payer: Self-pay | Admitting: Family Medicine

## 2017-08-09 ENCOUNTER — Other Ambulatory Visit: Payer: Medicare Other

## 2017-08-11 ENCOUNTER — Other Ambulatory Visit: Payer: Medicare Other

## 2017-08-11 DIAGNOSIS — Z5181 Encounter for therapeutic drug level monitoring: Secondary | ICD-10-CM

## 2017-08-11 DIAGNOSIS — E7800 Pure hypercholesterolemia, unspecified: Secondary | ICD-10-CM

## 2017-08-12 ENCOUNTER — Encounter: Payer: Self-pay | Admitting: Family Medicine

## 2017-08-12 LAB — LIPID PANEL
CHOL/HDL RATIO: 2.4 ratio (ref 0.0–4.4)
Cholesterol, Total: 169 mg/dL (ref 100–199)
HDL: 69 mg/dL (ref >39)
LDL CALC: 83 mg/dL (ref 0–99)
TRIGLYCERIDES: 87 mg/dL (ref 0–149)
VLDL CHOLESTEROL CAL: 17 mg/dL (ref 5–40)

## 2017-08-12 LAB — HEPATIC FUNCTION PANEL
ALBUMIN: 4.5 g/dL (ref 3.5–4.7)
ALK PHOS: 75 IU/L (ref 39–117)
ALT: 23 IU/L (ref 0–32)
AST: 32 IU/L (ref 0–40)
Bilirubin Total: 0.7 mg/dL (ref 0.0–1.2)
Bilirubin, Direct: 0.2 mg/dL (ref 0.00–0.40)
TOTAL PROTEIN: 6.4 g/dL (ref 6.0–8.5)

## 2017-08-12 NOTE — Progress Notes (Signed)
Need orders in epic for 6-7 surgery

## 2017-08-17 ENCOUNTER — Ambulatory Visit: Payer: Self-pay | Admitting: Orthopedic Surgery

## 2017-08-25 NOTE — Patient Instructions (Signed)
Alexis Hines  08/25/2017   Your procedure is scheduled on: Friday 09/03/2017  Report to [facility] Main  Entrance              Report to admitting at  115 PM    Call this number if you have problems the morning of surgery [phone]     Remember: Do not eat food :After Midnight. May have clear liquids from midnight up until 0945 am then nothing until after surgery!     CLEAR LIQUID DIET   Foods Allowed                                                                     Foods Excluded  Coffee and tea, regular and decaf                             liquids that you cannot  Plain Jell-O in any flavor                                             see through such as: Fruit ices (not with fruit pulp)                                     milk, soups, orange juice  Iced Popsicles                                    All solid food Carbonated beverages, regular and diet                                    Cranberry, grape and apple juices Sports drinks like Gatorade Lightly seasoned clear broth or consume(fat free) Sugar, honey syrup  Sample Menu Breakfast                                Lunch                                     Supper Cranberry juice                    Beef broth                            Chicken broth Jell-O                                     Grape juice  Apple juice Coffee or tea                        Jell-O                                      Popsicle                                                Coffee or tea                        Coffee or tea  _____________________________________________________________________     Take these medicines the morning of surgery with A SIP OF WATER: Xanax if needed                                You may not have any metal on your body including hair pins and              piercings  Do not wear jewelry, make-up, lotions, powders or perfumes, deodorant             Do not  wear nail polish.  Do not shave  48 hours prior to surgery.                Do not bring valuables to the hospital. [place].  Contacts, dentures or bridgework may not be worn into surgery.  Leave suitcase in the car. After surgery it may be brought to your room.                  Please read over the following fact sheets you were given: _____________________________________________________________________             [facility] - Preparing for Surgery Before surgery, you can play an important role.  Because skin is not sterile, your skin needs to be as free of germs as possible.  You can reduce the number of germs on your skin by washing with CHG (chlorahexidine gluconate) soap before surgery.  CHG is an antiseptic cleaner which kills germs and bonds with the skin to continue killing germs even after washing. Please DO NOT use if you have an allergy to CHG or antibacterial soaps.  If your skin becomes reddened/irritated stop using the CHG and inform your nurse when you arrive at Short Stay. Do not shave (including legs and underarms) for at least 48 hours prior to the first CHG shower.  You may shave your face/neck. Please follow these instructions carefully:  1.  Shower with CHG Soap the night before surgery and the  morning of Surgery.  2.  If you choose to wash your hair, wash your hair first as usual with your  normal  shampoo.  3.  After you shampoo, rinse your hair and body thoroughly to remove the  shampoo.                           4.  Use CHG as you would any other liquid soap.  You  can apply chg directly  to the skin and wash                       Gently with a scrungie or clean washcloth.  5.  Apply the CHG Soap to your body ONLY FROM THE NECK DOWN.   Do not use on face/ open                           Wound or open sores. Avoid contact with eyes, ears mouth and genitals (private parts).                       Wash face,  Genitals  (private parts) with your normal soap.             6.  Wash thoroughly, paying special attention to the area where your surgery  will be performed.  7.  Thoroughly rinse your body with warm water from the neck down.  8.  DO NOT shower/wash with your normal soap after using and rinsing off  the CHG Soap.                9.  Pat yourself dry with a clean towel.            10.  Wear clean pajamas.            11.  Place clean sheets on your bed the night of your first shower and do not  sleep with pets. Day of Surgery : Do not apply any lotions/deodorants the morning of surgery.  Please wear clean clothes to the hospital/surgery center.  FAILURE TO FOLLOW THESE INSTRUCTIONS MAY RESULT IN THE CANCELLATION OF YOUR SURGERY PATIENT SIGNATURE_________________________________  NURSE SIGNATURE__________________________________  ________________________________________________________________________   Adam Phenix  An incentive spirometer is a tool that can help keep your lungs clear and active. This tool measures how well you are filling your lungs with each breath. Taking long deep breaths may help reverse or decrease the chance of developing breathing (pulmonary) problems (especially infection) following:  A long period of time when you are unable to move or be active. BEFORE THE PROCEDURE   If the spirometer includes an indicator to show your best effort, your nurse or respiratory therapist will set it to a desired goal.  If possible, sit up straight or lean slightly forward. Try not to slouch.  Hold the incentive spirometer in an upright position. INSTRUCTIONS FOR USE  1. Sit on the edge of your bed if possible, or sit up as far as you can in bed or on a chair. 2. Hold the incentive spirometer in an upright position. 3. Breathe out normally. 4. Place the mouthpiece in your mouth and seal your lips tightly around it. 5. Breathe in slowly and as deeply as possible, raising the  piston or the ball toward the top of the column. 6. Hold your breath for 3-5 seconds or for as long as possible. Allow the piston or ball to fall to the bottom of the column. 7. Remove the mouthpiece from your mouth and breathe out normally. 8. Rest for a few seconds and repeat Steps 1 through 7 at least 10 times every 1-2 hours when you are awake. Take your time and take a few normal breaths between deep breaths. 9. The spirometer may include an indicator to show your best effort. Use the indicator as a goal to work toward  during each repetition. 10. After each set of 10 deep breaths, practice coughing to be sure your lungs are clear. If you have an incision (the cut made at the time of surgery), support your incision when coughing by placing a pillow or rolled up towels firmly against it. Once you are able to get out of bed, walk around indoors and cough well. You may stop using the incentive spirometer when instructed by your caregiver.  RISKS AND COMPLICATIONS  Take your time so you do not get dizzy or light-headed.  If you are in pain, you may need to take or ask for pain medication before doing incentive spirometry. It is harder to take a deep breath if you are having pain. AFTER USE  Rest and breathe slowly and easily.  It can be helpful to keep track of a log of your progress. Your caregiver can provide you with a simple table to help with this. If you are using the spirometer at home, follow these instructions: [place] IF:   You are having difficultly using the spirometer.  You have trouble using the spirometer as often as instructed.  Your pain medication is not giving enough relief while using the spirometer.  You develop fever of 100.5 F (38.1 C) or higher. SEEK IMMEDIATE MEDICAL CARE IF:   You cough up bloody sputum that had not been present before.  You develop fever of 102 F (38.9 C) or greater.  You develop worsening pain at or near the incision  site. MAKE SURE YOU:   Understand these instructions.  Will watch your condition.  Will get help right away if you are not doing well or get worse. Document Released: 07/27/2006 Document Revised: 06/08/2011 Document Reviewed: 09/27/2006 Exit[employer] Patient Information 2014 Exit[employer], [place].   ________________________________________________________________________  WHAT IS A BLOOD TRANSFUSION? Blood Transfusion Information  A transfusion is the replacement of blood or some of its parts. Blood is made up of multiple cells which provide different functions.  Red blood cells carry oxygen and are used for blood loss replacement.  White blood cells fight against infection.  Platelets control bleeding.  Plasma helps clot blood.  Other blood products are available for specialized needs, such as hemophilia or other clotting disorders. BEFORE THE TRANSFUSION  Who gives blood for transfusions?   Healthy volunteers who are fully evaluated to make sure their blood is safe. This is blood bank blood. Transfusion therapy is the safest it has ever been in the practice of medicine. Before blood is taken from a donor, a complete history is taken to make sure that person has no history of diseases nor engages in risky social behavior (examples are intravenous drug use or sexual activity with multiple partners). The donor's travel history is screened to minimize risk of transmitting infections, such as malaria. The donated blood is tested for signs of infectious diseases, such as HIV and hepatitis. The blood is then tested to be sure it is compatible with you in order to minimize the chance of a transfusion reaction. If you or a relative donates blood, this is often done in anticipation of surgery and is not appropriate for emergency situations. It takes many days to process the donated blood. RISKS AND COMPLICATIONS Although transfusion therapy is very safe and saves many lives, the main dangers of  transfusion include:   Getting an infectious disease.  Developing a transfusion reaction. This is an allergic reaction to something in the blood you were given. Every precaution is taken to  prevent this. The decision to have a blood transfusion has been considered carefully by your caregiver before blood is given. Blood is not given unless the benefits outweigh the risks. AFTER THE TRANSFUSION  Right after receiving a blood transfusion, you will usually feel much better and more energetic. This is especially true if your red blood cells have gotten low (anemic). The transfusion raises the level of the red blood cells which carry oxygen, and this usually causes an energy increase.  The nurse administering the transfusion will monitor you carefully for complications. HOME CARE INSTRUCTIONS  No special instructions are needed after a transfusion. You may find your energy is better. Speak with your caregiver about any limitations on activity for underlying diseases you may have. SEEK MEDICAL CARE IF:   Your condition is not improving after your transfusion.  You develop redness or irritation at the intravenous (IV) site. SEEK IMMEDIATE MEDICAL CARE IF:  Any of the following symptoms occur over the next 12 hours:  Shaking chills.  You have a temperature by mouth above 102 F (38.9 C), not controlled by medicine.  Chest, back, or muscle pain.  People around you feel you are not acting correctly or are confused.  Shortness of breath or difficulty breathing.  Dizziness and fainting.  You get a rash or develop hives.  You have a decrease in urine output.  Your urine turns a dark color or changes to pink, red, or brown. Any of the following symptoms occur over the next 10 days:  You have a temperature by mouth above 102 F (38.9 C), not controlled by medicine.  Shortness of breath.  Weakness after normal activity.  The white part of the eye turns yellow (jaundice).  You have a  decrease in the amount of urine or are urinating less often.  Your urine turns a dark color or changes to pink, red, or brown. Document Released: 03/13/2000 Document Revised: 06/08/2011 Document Reviewed: 10/31/2007 [facility] Patient Information 2014 [place], [place].  _______________________________________________________________________

## 2017-08-26 ENCOUNTER — Encounter (HOSPITAL_COMMUNITY): Admission: RE | Admit: 2017-08-26 | Payer: Medicare Other | Source: Ambulatory Visit

## 2017-08-27 ENCOUNTER — Ambulatory Visit (INDEPENDENT_AMBULATORY_CARE_PROVIDER_SITE_OTHER): Payer: Medicare Other | Admitting: Family Medicine

## 2017-08-27 ENCOUNTER — Encounter (HOSPITAL_COMMUNITY): Payer: Self-pay

## 2017-08-27 ENCOUNTER — Other Ambulatory Visit: Payer: Self-pay

## 2017-08-27 ENCOUNTER — Encounter: Payer: Self-pay | Admitting: Family Medicine

## 2017-08-27 ENCOUNTER — Encounter (HOSPITAL_COMMUNITY)
Admission: RE | Admit: 2017-08-27 | Discharge: 2017-08-27 | Disposition: A | Discharge status: Home / Self Care | Payer: Medicare Other | Source: Ambulatory Visit | Attending: Specialist | Admitting: Specialist

## 2017-08-27 VITALS — BP 128/78 | HR 92 | Temp 98.1°F | Ht 59.0 in | Wt 135.6 lb

## 2017-08-27 DIAGNOSIS — W57XXXA Bitten or stung by nonvenomous insect and other nonvenomous arthropods, initial encounter: Secondary | ICD-10-CM

## 2017-08-27 DIAGNOSIS — S80862A Insect bite (nonvenomous), left lower leg, initial encounter: Secondary | ICD-10-CM | POA: Diagnosis not present

## 2017-08-27 DIAGNOSIS — Z01812 Encounter for preprocedural laboratory examination: Secondary | ICD-10-CM | POA: Diagnosis present

## 2017-08-27 HISTORY — DX: Unspecified osteoarthritis, unspecified site: M19.90

## 2017-08-27 LAB — CBC
HEMATOCRIT: 46.6 % — AB (ref 36.0–46.0)
HEMOGLOBIN: 15.6 g/dL — AB (ref 12.0–15.0)
MCH: 31.1 pg (ref 26.0–34.0)
MCHC: 33.5 g/dL (ref 30.0–36.0)
MCV: 93 fL (ref 78.0–100.0)
Platelets: 240 10*3/uL (ref 150–400)
RBC: 5.01 MIL/uL (ref 3.87–5.11)
RDW: 12.4 % (ref 11.5–15.5)
WBC: 6.9 10*3/uL (ref 4.0–10.5)

## 2017-08-27 LAB — BASIC METABOLIC PANEL
ANION GAP: 6 (ref 5–15)
BUN: 19 mg/dL (ref 6–20)
CO2: 31 mmol/L (ref 22–32)
Calcium: 9.1 mg/dL (ref 8.9–10.3)
Chloride: 103 mmol/L (ref 101–111)
Creatinine, Ser: 0.77 mg/dL (ref 0.44–1.00)
Glucose, Bld: 91 mg/dL (ref 65–99)
POTASSIUM: 4.4 mmol/L (ref 3.5–5.1)
SODIUM: 140 mmol/L (ref 135–145)

## 2017-08-27 LAB — URINALYSIS, ROUTINE W REFLEX MICROSCOPIC
BILIRUBIN URINE: NEGATIVE
GLUCOSE, UA: NEGATIVE mg/dL
HGB URINE DIPSTICK: NEGATIVE
Ketones, ur: NEGATIVE mg/dL
Leukocytes, UA: NEGATIVE
Nitrite: NEGATIVE
PROTEIN: NEGATIVE mg/dL
Specific Gravity, Urine: 1.018 (ref 1.005–1.030)
pH: 7 (ref 5.0–8.0)

## 2017-08-27 LAB — SURGICAL PCR SCREEN
MRSA, PCR: NEGATIVE
Staphylococcus aureus: NEGATIVE

## 2017-08-27 LAB — PROTIME-INR
INR: 0.98
Prothrombin Time: 12.9 seconds (ref 11.4–15.2)

## 2017-08-27 LAB — APTT: APTT: 28 s (ref 24–36)

## 2017-08-27 LAB — ABO/RH: ABO/RH(D): O POS

## 2017-08-27 NOTE — H&P (Signed)
TOTAL KNEE ADMISSION H&P  Patient is being admitted for right total knee arthroplasty.  Subjective:  Chief Complaint:right knee pain.  HPI: Alexis Hines, 84 y.o. female, has a history of pain and functional disability in the right knee due to arthritis and has failed non-surgical conservative treatments for greater than 12 weeks to includecorticosteriod injections, flexibility and strengthening excercises, use of assistive devices and activity modification.  Onset of symptoms was gradual, starting 3 years ago with gradually worsening course since that time. The patient noted prior procedures on the knee to include  arthroplasty on the left knee(s).  Patient currently rates pain in the right knee(s) at 7 out of 10 with activity. Patient has worsening of pain with activity and weight bearing, pain that interferes with activities of daily living, pain with passive range of motion and joint swelling.  Patient has evidence of subchondral sclerosis, periarticular osteophytes and joint space narrowing by imaging studies.There is no active infection.  Patient Active Problem List   Diagnosis Date Noted  . Generalized anxiety disorder 02/04/2017  . Precordial pain 10/26/2013  . Hip arthritis 12/29/2012  . Allergic rhinitis, cause unspecified 08/26/2011  . Heme + stool 12/23/2010  . Pure hypercholesterolemia 11/26/2010   Past Medical History:  Diagnosis Date  . Abnormal TSH 2004   normal since  . Arthritis   . Basal cell cancer    nose; Dr. Allyson Sabal   . Diverticulosis 8/07  . Migraine childhood   resolved  . Pure hypercholesterolemia   . SCCA (squamous cell carcinoma) of skin 12/2015   right temple    Past Surgical History:  Procedure Laterality Date  . BREAST BIOPSY Left 1984   benign  . left hip intra-articular steriod injection  03/13/2014  . TONSILLECTOMY  age 82  . TOTAL KNEE ARTHROPLASTY Left 07/2003   Dr. Theda Sers    No current facility-administered medications for this  encounter.    Current Outpatient Medications  Medication Sig Dispense Refill Last Dose  . ALPRAZolam (XANAX) 0.25 MG tablet Take 0.125-0.25 mg by mouth daily as needed for anxiety.     Marland Kitchen atorvastatin (LIPITOR) 40 MG tablet Take 40 mg by mouth at bedtime.    Taking  . Calcium Carbonate-Vitamin D (CALTRATE 600+D) 600-400 MG-UNIT per tablet Take 1 tablet by mouth daily at 3 pm.    Taking  . ibuprofen (ADVIL,MOTRIN) 200 MG tablet Take 200-400 mg by mouth daily as needed (for pain).    Taking  . Multiple Vitamin (MULTIVITAMIN WITH MINERALS) TABS tablet Take 1 tablet by mouth daily. CENTRUM MULTIVITAMIN     . traMADol (ULTRAM) 50 MG tablet Take 50 mg by mouth 2 (two) times daily as needed (for pain.).    Taking   No Known Allergies  Social History   Tobacco Use  . Smoking status: Never Smoker  . Smokeless tobacco: Never Used  Substance Use Topics  . Alcohol use: Yes    Comment: occasional glass of wine.    Family History  Problem Relation Age of Onset  . Cancer Sister        bone cancer L leg with mets to lung, diagnosed age 73; s/p amputation  . Hyperlipidemia Brother   . Heart disease Brother 69       CABG  . Cancer Sister 19       breast cancer and lung cancer (lung dx'd first)     Review of Systems  Constitutional: Negative.   HENT: Negative.   Eyes: Negative.  Respiratory: Negative.   Cardiovascular: Negative.   Gastrointestinal: Negative.   Genitourinary: Negative.   Musculoskeletal: Positive for joint pain.  Skin: Negative.   Neurological: Negative.   Endo/Heme/Allergies: Negative.   Psychiatric/Behavioral: Negative.     Objective:  Physical Exam  Constitutional: She is oriented to person, place, and time. She appears well-developed.  HENT:  Head: Normocephalic.  Eyes: EOM are normal.  Neck: Normal range of motion.  Cardiovascular: Intact distal pulses.  Respiratory: Effort normal.  GI: Soft.  Musculoskeletal:  Right knee pain. Limited ROM. Crepitus is  noted.  Neurological: She is alert and oriented to person, place, and time.  Skin: Skin is warm and dry.  Psychiatric: Her behavior is normal.    Vital signs in last 24 hours: Temp:  [97.9 F (36.6 C)] 97.9 F (36.6 C) (05/31 0825) Pulse Rate:  [67] 67 (05/31 0825) Resp:  [18] 18 (05/31 0825) BP: (158)/(69) 158/69 (05/31 0825) SpO2:  [98 %] 98 % (05/31 0825) Weight:  [60.9 kg (134 lb 3.2 oz)] 60.9 kg (134 lb 3.2 oz) (05/31 0825)  Labs:   Estimated body mass index is 27.11 kg/m as calculated from the following:   Height as of 08/27/17: 4\' 11"  (1.499 m).   Weight as of 08/27/17: 60.9 kg (134 lb 3.2 oz).   Imaging Review Plain radiographs demonstrate moderate degenerative joint disease of the right knee(s). The overall alignment ismild varus. The bone quality appears to be good for age and reported activity level.   Preoperative templating of the joint replacement has been completed, documented, and submitted to the Operating Room personnel in order to optimize intra-operative equipment management.   Anticipated LOS equal to or greater than 2 midnights due to - Age 17 and older with one or more of the following:  - Obesity  - Expected need for hospital services (PT, OT, Nursing) required for safe  discharge  - Anticipated need for postoperative skilled nursing care or inpatient rehab  - Active co-morbidities: None OR   - Unanticipated findings during/Post Surgery: None  - Patient is a high risk of re-admission due to: None     Assessment/Plan:  End stage arthritis, right knee   The patient history, physical examination, clinical judgment of the provider and imaging studies are consistent with end stage degenerative joint disease of the right knee(s) and total knee arthroplasty is deemed medically necessary. The treatment options including medical management, injection therapy arthroscopy and arthroplasty were discussed at length. The risks and benefits of total knee  arthroplasty were presented and reviewed. The risks due to aseptic loosening, infection, stiffness, patella tracking problems, thromboembolic complications and other imponderables were discussed. The patient acknowledged the explanation, agreed to proceed with the plan and consent was signed. Patient is being admitted for inpatient treatment for surgery, pain control, PT, OT, prophylactic antibiotics, VTE prophylaxis, progressive ambulation and ADL's and discharge planning. The patient is planning to be discharged home with home health services.  Will use IV tranexamic acid. Contraindications and adverse affects of Tranexamic acid discussed in detail. Patient denies any of these at this time and understands the risks and benefits.

## 2017-08-27 NOTE — Progress Notes (Signed)
   Subjective:    Patient ID: Alexis Hines, female    DOB: [birth year], 84 y.o.   MRN: [id number]  HPI Apparently a tick was pulled off of her left mid thigh approximately 2 weeks ago.  She showed me the size of that and it was approximately 1 cm in size.  She has had no difficulty with fever, chills, or rash.   Review of Systems     Objective:   Physical Exam Alert and in no distress.  Healing lesion noted on the mid medial thigh area.  Slight erythema is noted but it is not hot and no drainage noted.       Assessment & Plan:  Tick bite of left lower leg, initial encounter I explained that at this point nothing need to be done.  She has no risk of Lyme disease and has no symptoms of RMSF.

## 2017-08-27 NOTE — Progress Notes (Signed)
07/05/2017- noted in Epic-Stress test  06/16/2017-- noted in Epic- US Carotid Bilateral

## 2017-09-02 MED ORDER — TRANEXAMIC ACID 1000 MG/10ML IV SOLN
1000.0000 mg | INTRAVENOUS | Status: AC
  Administered 2017-09-03: 1000 mg via INTRAVENOUS
  Filled 2017-09-02: qty 1100

## 2017-09-03 ENCOUNTER — Inpatient Hospital Stay (HOSPITAL_COMMUNITY)
Admission: AD | Admit: 2017-09-03 | Discharge: 2017-09-06 | DRG: 470 | Disposition: A | Discharge status: Skilled Nursing Facility | Payer: Medicare Other | Attending: Specialist | Admitting: Specialist

## 2017-09-03 ENCOUNTER — Other Ambulatory Visit: Payer: Self-pay

## 2017-09-03 ENCOUNTER — Ambulatory Visit (HOSPITAL_COMMUNITY): Payer: Medicare Other | Admitting: Anesthesiology

## 2017-09-03 ENCOUNTER — Encounter (HOSPITAL_COMMUNITY)
Admission: AD | Disposition: A | Discharge status: Skilled Nursing Facility | Payer: Self-pay | Source: Home / Self Care | Attending: Specialist

## 2017-09-03 ENCOUNTER — Encounter (HOSPITAL_COMMUNITY): Payer: Self-pay

## 2017-09-03 DIAGNOSIS — M1711 Unilateral primary osteoarthritis, right knee: Principal | ICD-10-CM

## 2017-09-03 DIAGNOSIS — Z803 Family history of malignant neoplasm of breast: Secondary | ICD-10-CM

## 2017-09-03 DIAGNOSIS — Z8349 Family history of other endocrine, nutritional and metabolic diseases: Secondary | ICD-10-CM

## 2017-09-03 DIAGNOSIS — M25761 Osteophyte, right knee: Secondary | ICD-10-CM | POA: Diagnosis present

## 2017-09-03 DIAGNOSIS — Z808 Family history of malignant neoplasm of other organs or systems: Secondary | ICD-10-CM

## 2017-09-03 DIAGNOSIS — F411 Generalized anxiety disorder: Secondary | ICD-10-CM | POA: Diagnosis present

## 2017-09-03 DIAGNOSIS — Z79899 Other long term (current) drug therapy: Secondary | ICD-10-CM

## 2017-09-03 DIAGNOSIS — Z801 Family history of malignant neoplasm of trachea, bronchus and lung: Secondary | ICD-10-CM

## 2017-09-03 DIAGNOSIS — Z96652 Presence of left artificial knee joint: Secondary | ICD-10-CM | POA: Diagnosis present

## 2017-09-03 DIAGNOSIS — E7800 Pure hypercholesterolemia, unspecified: Secondary | ICD-10-CM | POA: Diagnosis present

## 2017-09-03 DIAGNOSIS — Z791 Long term (current) use of non-steroidal anti-inflammatories (NSAID): Secondary | ICD-10-CM | POA: Diagnosis not present

## 2017-09-03 DIAGNOSIS — Z85828 Personal history of other malignant neoplasm of skin: Secondary | ICD-10-CM | POA: Diagnosis not present

## 2017-09-03 DIAGNOSIS — Z79891 Long term (current) use of opiate analgesic: Secondary | ICD-10-CM | POA: Diagnosis not present

## 2017-09-03 DIAGNOSIS — Z96659 Presence of unspecified artificial knee joint: Secondary | ICD-10-CM

## 2017-09-03 DIAGNOSIS — Z8249 Family history of ischemic heart disease and other diseases of the circulatory system: Secondary | ICD-10-CM | POA: Diagnosis not present

## 2017-09-03 DIAGNOSIS — K5900 Constipation, unspecified: Secondary | ICD-10-CM | POA: Diagnosis present

## 2017-09-03 HISTORY — PX: TOTAL KNEE ARTHROPLASTY: SHX125

## 2017-09-03 LAB — TYPE AND SCREEN
ABO/RH(D): O POS
ANTIBODY SCREEN: NEGATIVE

## 2017-09-03 SURGERY — ARTHROPLASTY, KNEE, TOTAL
Anesthesia: Spinal | Site: Knee | Laterality: Right

## 2017-09-03 MED ORDER — 0.9 % SODIUM CHLORIDE (POUR BTL) OPTIME
TOPICAL | Status: DC | PRN
  Administered 2017-09-03: 1000 mL

## 2017-09-03 MED ORDER — BACLOFEN 10 MG PO TABS: 10.0000 mg | ORAL_TABLET | Freq: Three times a day (TID) | ORAL | 1 refills | Status: DC | PRN

## 2017-09-03 MED ORDER — CEFAZOLIN SODIUM-DEXTROSE 2-4 GM/100ML-% IV SOLN
2.0000 g | INTRAVENOUS | Status: AC
  Administered 2017-09-03: 2 g via INTRAVENOUS
  Filled 2017-09-03: qty 100

## 2017-09-03 MED ORDER — ONDANSETRON HCL 4 MG PO TABS: 4.0000 mg | ORAL_TABLET | Freq: Four times a day (QID) | ORAL | Status: DC | PRN

## 2017-09-03 MED ORDER — PHENOL 1.4 % MT LIQD
1.0000 | OROMUCOSAL | Status: DC | PRN
Start: 2017-09-03 — End: 2017-09-06

## 2017-09-03 MED ORDER — ASPIRIN EC 325 MG PO TBEC: 325.0000 mg | DELAYED_RELEASE_TABLET | Freq: Two times a day (BID) | ORAL | 0 refills | Status: DC

## 2017-09-03 MED ORDER — MENTHOL 3 MG MT LOZG
1.0000 | LOZENGE | OROMUCOSAL | Status: DC | PRN
  Administered 2017-09-05: 3 mg via ORAL
  Filled 2017-09-03: qty 9

## 2017-09-03 MED ORDER — OXYCODONE HCL 5 MG PO TABS
10.0000 mg | ORAL_TABLET | ORAL | Status: DC | PRN
  Administered 2017-09-04 (×2): 10 mg via ORAL
  Administered 2017-09-05: 15 mg via ORAL
  Administered 2017-09-05 (×2): 10 mg via ORAL
  Administered 2017-09-05 – 2017-09-06 (×2): 15 mg via ORAL
  Filled 2017-09-03 (×2): qty 3
  Filled 2017-09-03: qty 2
  Filled 2017-09-03 (×2): qty 3
  Filled 2017-09-03: qty 2

## 2017-09-03 MED ORDER — ONDANSETRON HCL 4 MG/2ML IJ SOLN
INTRAMUSCULAR | Status: DC | PRN
  Administered 2017-09-03: 4 mg via INTRAVENOUS

## 2017-09-03 MED ORDER — SODIUM CHLORIDE 0.9 % IR SOLN
Status: DC | PRN
  Administered 2017-09-03: 1000 mL

## 2017-09-03 MED ORDER — HYDROMORPHONE HCL 1 MG/ML IJ SOLN: 0.2500 mg | INTRAMUSCULAR | Status: DC | PRN

## 2017-09-03 MED ORDER — SODIUM CHLORIDE 0.9 % IV SOLN
INTRAVENOUS | Status: DC
  Administered 2017-09-03: 22:00:00 via INTRAVENOUS

## 2017-09-03 MED ORDER — DEXAMETHASONE SODIUM PHOSPHATE 10 MG/ML IJ SOLN
INTRAMUSCULAR | Status: AC
  Filled 2017-09-03: qty 1

## 2017-09-03 MED ORDER — DIPHENHYDRAMINE HCL 12.5 MG/5ML PO ELIX: 12.5000 mg | ORAL_SOLUTION | ORAL | Status: DC | PRN

## 2017-09-03 MED ORDER — PROPOFOL 10 MG/ML IV BOLUS
INTRAVENOUS | Status: AC
Start: 2017-09-03 — End: ?
  Filled 2017-09-03: qty 60

## 2017-09-03 MED ORDER — MAGNESIUM CITRATE PO SOLN: 1.0000 | Freq: Once | ORAL | Status: DC | PRN

## 2017-09-03 MED ORDER — ONDANSETRON HCL 4 MG/2ML IJ SOLN
INTRAMUSCULAR | Status: AC
  Filled 2017-09-03: qty 2

## 2017-09-03 MED ORDER — METHOCARBAMOL 1000 MG/10ML IJ SOLN
500.0000 mg | Freq: Four times a day (QID) | INTRAVENOUS | Status: DC | PRN
  Administered 2017-09-03: 500 mg via INTRAVENOUS
  Filled 2017-09-03: qty 550

## 2017-09-03 MED ORDER — ALPRAZOLAM 0.25 MG PO TABS
0.1250 mg | ORAL_TABLET | Freq: Every day | ORAL | Status: DC | PRN
  Administered 2017-09-03: 0.25 mg via ORAL
  Filled 2017-09-03: qty 1

## 2017-09-03 MED ORDER — PHENYLEPHRINE HCL 10 MG/ML IJ SOLN
INTRAMUSCULAR | Status: AC
  Filled 2017-09-03: qty 1

## 2017-09-03 MED ORDER — STERILE WATER FOR IRRIGATION IR SOLN
Status: DC | PRN
  Administered 2017-09-03: 1000 mL

## 2017-09-03 MED ORDER — ENOXAPARIN SODIUM 30 MG/0.3ML ~~LOC~~ SOLN
30.0000 mg | Freq: Two times a day (BID) | SUBCUTANEOUS | Status: DC
  Administered 2017-09-04 – 2017-09-06 (×5): 30 mg via SUBCUTANEOUS
  Filled 2017-09-03 (×5): qty 0.3

## 2017-09-03 MED ORDER — ONDANSETRON HCL 4 MG/2ML IJ SOLN: 4.0000 mg | Freq: Once | INTRAMUSCULAR | Status: DC | PRN

## 2017-09-03 MED ORDER — PROPOFOL 10 MG/ML IV BOLUS
INTRAVENOUS | Status: DC | PRN
  Administered 2017-09-03: 30 mg via INTRAVENOUS

## 2017-09-03 MED ORDER — MEPERIDINE HCL 50 MG/ML IJ SOLN: 6.2500 mg | INTRAMUSCULAR | Status: DC | PRN

## 2017-09-03 MED ORDER — POLYETHYLENE GLYCOL 3350 17 G PO PACK
17.0000 g | PACK | Freq: Every day | ORAL | Status: DC | PRN
Start: 2017-09-03 — End: 2017-09-06

## 2017-09-03 MED ORDER — BISACODYL 5 MG PO TBEC: 5.0000 mg | DELAYED_RELEASE_TABLET | Freq: Every day | ORAL | Status: DC | PRN

## 2017-09-03 MED ORDER — DEXAMETHASONE SODIUM PHOSPHATE 10 MG/ML IJ SOLN
10.0000 mg | Freq: Once | INTRAMUSCULAR | Status: AC
  Administered 2017-09-03: 10 mg via INTRAVENOUS

## 2017-09-03 MED ORDER — METOCLOPRAMIDE HCL 5 MG PO TABS: 5.0000 mg | ORAL_TABLET | Freq: Three times a day (TID) | ORAL | Status: DC | PRN

## 2017-09-03 MED ORDER — DEXAMETHASONE SODIUM PHOSPHATE 10 MG/ML IJ SOLN
10.0000 mg | Freq: Once | INTRAMUSCULAR | Status: AC
  Administered 2017-09-04: 10 mg via INTRAVENOUS
  Filled 2017-09-03: qty 1

## 2017-09-03 MED ORDER — CHLORHEXIDINE GLUCONATE 4 % EX LIQD: 60.0000 mL | Freq: Once | CUTANEOUS | Status: DC

## 2017-09-03 MED ORDER — ATORVASTATIN CALCIUM 40 MG PO TABS
40.0000 mg | ORAL_TABLET | Freq: Every day | ORAL | Status: DC
  Administered 2017-09-03 – 2017-09-05 (×3): 40 mg via ORAL
  Filled 2017-09-03 (×3): qty 1

## 2017-09-03 MED ORDER — FERROUS SULFATE 325 (65 FE) MG PO TABS
325.0000 mg | ORAL_TABLET | Freq: Three times a day (TID) | ORAL | Status: DC
  Administered 2017-09-04 – 2017-09-06 (×8): 325 mg via ORAL
  Filled 2017-09-03 (×8): qty 1

## 2017-09-03 MED ORDER — OXYCODONE HCL 5 MG PO TABS: 5.0000 mg | ORAL_TABLET | ORAL | 0 refills | Status: DC | PRN

## 2017-09-03 MED ORDER — ACETAMINOPHEN 325 MG PO TABS
325.0000 mg | ORAL_TABLET | Freq: Four times a day (QID) | ORAL | Status: DC | PRN
  Administered 2017-09-05 – 2017-09-06 (×3): 650 mg via ORAL
  Filled 2017-09-03 (×3): qty 2

## 2017-09-03 MED ORDER — ACETAMINOPHEN 500 MG PO TABS
1000.0000 mg | ORAL_TABLET | Freq: Four times a day (QID) | ORAL | Status: AC
  Administered 2017-09-03 – 2017-09-04 (×4): 1000 mg via ORAL
  Filled 2017-09-03 (×4): qty 2

## 2017-09-03 MED ORDER — ALUM & MAG HYDROXIDE-SIMETH 200-200-20 MG/5ML PO SUSP: 30.0000 mL | ORAL | Status: DC | PRN

## 2017-09-03 MED ORDER — BUPIVACAINE-EPINEPHRINE 0.25% -1:200000 IJ SOLN
INTRAMUSCULAR | Status: DC | PRN
  Administered 2017-09-03: 30 mL

## 2017-09-03 MED ORDER — SODIUM CHLORIDE 0.9 % IJ SOLN
INTRAMUSCULAR | Status: DC | PRN
  Administered 2017-09-03: 30 mL

## 2017-09-03 MED ORDER — KETOROLAC TROMETHAMINE 30 MG/ML IJ SOLN
INTRAMUSCULAR | Status: DC | PRN
  Administered 2017-09-03: 30 mg

## 2017-09-03 MED ORDER — PROPOFOL 500 MG/50ML IV EMUL
INTRAVENOUS | Status: DC | PRN
  Administered 2017-09-03: 50 ug/kg/min via INTRAVENOUS

## 2017-09-03 MED ORDER — MEPERIDINE HCL 50 MG/ML IJ SOLN
6.2500 mg | INTRAMUSCULAR | Status: DC | PRN
Start: 2017-09-03 — End: 2017-09-03

## 2017-09-03 MED ORDER — OXYCODONE HCL 5 MG PO TABS
5.0000 mg | ORAL_TABLET | ORAL | Status: DC | PRN
Start: 2017-09-03 — End: 2017-09-06
  Administered 2017-09-03: 5 mg via ORAL
  Administered 2017-09-04 – 2017-09-05 (×5): 10 mg via ORAL
  Filled 2017-09-03 (×6): qty 2
  Filled 2017-09-03: qty 1

## 2017-09-03 MED ORDER — ROPIVACAINE HCL 7.5 MG/ML IJ SOLN
INTRAMUSCULAR | Status: DC | PRN
  Administered 2017-09-03: 20 mL via PERINEURAL

## 2017-09-03 MED ORDER — PHENYLEPHRINE HCL 10 MG/ML IJ SOLN
INTRAVENOUS | Status: DC | PRN
  Administered 2017-09-03: 40 ug/min via INTRAVENOUS

## 2017-09-03 MED ORDER — KETOROLAC TROMETHAMINE 30 MG/ML IJ SOLN
INTRAMUSCULAR | Status: AC
  Filled 2017-09-03: qty 1

## 2017-09-03 MED ORDER — METOCLOPRAMIDE HCL 5 MG/ML IJ SOLN: 5.0000 mg | Freq: Three times a day (TID) | INTRAMUSCULAR | Status: DC | PRN

## 2017-09-03 MED ORDER — SODIUM CHLORIDE 0.9 % IJ SOLN
INTRAMUSCULAR | Status: AC
  Filled 2017-09-03: qty 50

## 2017-09-03 MED ORDER — LACTATED RINGERS IV SOLN
INTRAVENOUS | Status: DC
  Administered 2017-09-03 (×2): via INTRAVENOUS

## 2017-09-03 MED ORDER — FENTANYL CITRATE (PF) 100 MCG/2ML IJ SOLN
50.0000 ug | INTRAMUSCULAR | Status: DC
  Administered 2017-09-03 – 2017-09-04 (×2): 50 ug via INTRAVENOUS
  Filled 2017-09-03 (×2): qty 2

## 2017-09-03 MED ORDER — CEFAZOLIN SODIUM-DEXTROSE 2-4 GM/100ML-% IV SOLN
2.0000 g | Freq: Four times a day (QID) | INTRAVENOUS | Status: AC
  Administered 2017-09-03 – 2017-09-04 (×2): 2 g via INTRAVENOUS
  Filled 2017-09-03 (×2): qty 100

## 2017-09-03 MED ORDER — HYDROMORPHONE HCL 1 MG/ML IJ SOLN
0.5000 mg | INTRAMUSCULAR | Status: DC | PRN
  Administered 2017-09-03: 0.5 mg via INTRAVENOUS
  Administered 2017-09-04: 1 mg via INTRAVENOUS
  Filled 2017-09-03 (×2): qty 1

## 2017-09-03 MED ORDER — METHOCARBAMOL 500 MG PO TABS
500.0000 mg | ORAL_TABLET | Freq: Four times a day (QID) | ORAL | Status: DC | PRN
  Administered 2017-09-04 – 2017-09-06 (×7): 500 mg via ORAL
  Filled 2017-09-03 (×7): qty 1

## 2017-09-03 MED ORDER — MIDAZOLAM HCL 2 MG/2ML IJ SOLN
1.0000 mg | INTRAMUSCULAR | Status: DC
  Administered 2017-09-03: 2 mg via INTRAVENOUS
  Filled 2017-09-03: qty 2

## 2017-09-03 MED ORDER — BUPIVACAINE IN DEXTROSE 0.75-8.25 % IT SOLN
INTRATHECAL | Status: DC | PRN
  Administered 2017-09-03: 1.6 mL via INTRATHECAL

## 2017-09-03 MED ORDER — DOCUSATE SODIUM 100 MG PO CAPS
100.0000 mg | ORAL_CAPSULE | Freq: Two times a day (BID) | ORAL | Status: DC
  Administered 2017-09-03 – 2017-09-06 (×6): 100 mg via ORAL
  Filled 2017-09-03 (×6): qty 1

## 2017-09-03 MED ORDER — BUPIVACAINE-EPINEPHRINE (PF) 0.25% -1:200000 IJ SOLN
INTRAMUSCULAR | Status: AC
  Filled 2017-09-03: qty 30

## 2017-09-03 MED ORDER — ONDANSETRON HCL 4 MG/2ML IJ SOLN: 4.0000 mg | Freq: Four times a day (QID) | INTRAMUSCULAR | Status: DC | PRN

## 2017-09-03 SURGICAL SUPPLY — 63 items
ADH SKN CLS APL DERMABOND .7 (GAUZE/BANDAGES/DRESSINGS) ×1
BAG DECANTER FOR FLEXI CONT (MISCELLANEOUS) IMPLANT
BAG SPEC THK2 15X12 ZIP CLS (MISCELLANEOUS) ×2
BAG ZIPLOCK 12X15 (MISCELLANEOUS) ×4 IMPLANT
BANDAGE ACE 4X5 VEL STRL LF (GAUZE/BANDAGES/DRESSINGS) ×2 IMPLANT
BANDAGE ACE 6X5 VEL STRL LF (GAUZE/BANDAGES/DRESSINGS) ×2 IMPLANT
BLADE SAG 18X100X1.27 (BLADE) ×2 IMPLANT
BLADE SAW SGTL 13.0X1.19X90.0M (BLADE) ×2 IMPLANT
BOWL SMART MIX CTS (DISPOSABLE) ×2 IMPLANT
CAP KNEE TOTAL 3 SIGMA ×1 IMPLANT
CEMENT HV SMART SET (Cement) ×2 IMPLANT
COVER SURGICAL LIGHT HANDLE (MISCELLANEOUS) ×2 IMPLANT
CUFF TOURN SGL QUICK 34 (TOURNIQUET CUFF) ×2
CUFF TRNQT CYL 34X4X40X1 (TOURNIQUET CUFF) ×1 IMPLANT
DECANTER SPIKE VIAL GLASS SM (MISCELLANEOUS) ×2 IMPLANT
DERMABOND ADVANCED (GAUZE/BANDAGES/DRESSINGS) ×1
DERMABOND ADVANCED .7 DNX12 (GAUZE/BANDAGES/DRESSINGS) ×1 IMPLANT
DRAPE U-SHAPE 47X51 STRL (DRAPES) ×2 IMPLANT
DRSG AQUACEL AG ADV 3.5X10 (GAUZE/BANDAGES/DRESSINGS) ×2 IMPLANT
DRSG TEGADERM 4X4.75 (GAUZE/BANDAGES/DRESSINGS) ×2 IMPLANT
DURAPREP 26ML APPLICATOR (WOUND CARE) ×4 IMPLANT
ELECT REM PT RETURN 15FT ADLT (MISCELLANEOUS) ×2 IMPLANT
EVACUATOR 1/8 PVC DRAIN (DRAIN) ×2 IMPLANT
GAUZE SPONGE 2X2 8PLY STRL LF (GAUZE/BANDAGES/DRESSINGS) ×1 IMPLANT
GLOVE BIO SURGEON STRL SZ7.5 (GLOVE) ×4 IMPLANT
GLOVE BIOGEL PI IND STRL 8 (GLOVE) ×2 IMPLANT
GLOVE BIOGEL PI INDICATOR 8 (GLOVE) ×2
GLOVE ECLIPSE 8.0 STRL XLNG CF (GLOVE) ×4 IMPLANT
GLOVE SURG ORTHO 9.0 STRL STRW (GLOVE) ×2 IMPLANT
GLOVE SURG SS PI 7.5 STRL IVOR (GLOVE) ×2 IMPLANT
GOWN STRL REUS W/TWL XL LVL3 (GOWN DISPOSABLE) ×4 IMPLANT
HANDPIECE INTERPULSE COAX TIP (DISPOSABLE) ×2
HOLDER FOLEY CATH W/STRAP (MISCELLANEOUS) IMPLANT
IMMOBILIZER KNEE 20 (SOFTGOODS) ×2
IMMOBILIZER KNEE 20 THIGH 36 (SOFTGOODS) ×1 IMPLANT
NDL SAFETY ECLIPSE 18X1.5 (NEEDLE) IMPLANT
NEEDLE HYPO 18GX1.5 SHARP (NEEDLE)
NS IRRIG 1000ML POUR BTL (IV SOLUTION) ×2 IMPLANT
PACK TOTAL KNEE CUSTOM (KITS) ×2 IMPLANT
POSITIONER SURGICAL ARM (MISCELLANEOUS) ×2 IMPLANT
SET HNDPC FAN SPRY TIP SCT (DISPOSABLE) ×1 IMPLANT
SET PAD KNEE POSITIONER (MISCELLANEOUS) ×2 IMPLANT
SPONGE GAUZE 2X2 STER 10/PKG (GAUZE/BANDAGES/DRESSINGS) ×1
SPONGE LAP 18X18 RF (DISPOSABLE) IMPLANT
SPONGE SURGIFOAM ABS GEL 100 (HEMOSTASIS) ×2 IMPLANT
STOCKINETTE 6  STRL (DRAPES) ×1
STOCKINETTE 6 STRL (DRAPES) ×1 IMPLANT
SUCTION FRAZIER HANDLE 12FR (TUBING) ×1
SUCTION TUBE FRAZIER 12FR DISP (TUBING) ×1 IMPLANT
SUT BONE WAX W31G (SUTURE) IMPLANT
SUT MNCRL AB 3-0 PS2 18 (SUTURE) ×2 IMPLANT
SUT VIC AB 1 CT1 27 (SUTURE) ×8
SUT VIC AB 1 CT1 27XBRD ANTBC (SUTURE) ×4 IMPLANT
SUT VIC AB 2-0 CT1 27 (SUTURE) ×4
SUT VIC AB 2-0 CT1 TAPERPNT 27 (SUTURE) ×2 IMPLANT
SUT VLOC 180 0 24IN GS25 (SUTURE) ×2 IMPLANT
SYR 3ML LL SCALE MARK (SYRINGE) IMPLANT
SYR 50ML LL SCALE MARK (SYRINGE) ×2 IMPLANT
TAPE STRIPS DRAPE STRL (GAUZE/BANDAGES/DRESSINGS) ×2 IMPLANT
TRAY FOLEY MTR SLVR 16FR STAT (SET/KITS/TRAYS/PACK) ×2 IMPLANT
WATER STERILE IRR 1000ML POUR (IV SOLUTION) ×4 IMPLANT
WRAP KNEE MAXI GEL POST OP (GAUZE/BANDAGES/DRESSINGS) ×2 IMPLANT
YANKAUER SUCT BULB TIP 10FT TU (MISCELLANEOUS) ×2 IMPLANT

## 2017-09-03 NOTE — Anesthesia Procedure Notes (Signed)
Spinal  Patient location during procedure: OR Start time: 09/03/2017 4:14 PM End time: 09/03/2017 4:19 PM Reason for block: at surgeon's request Staffing Resident/CRNA: Anne Fu, CRNA Performed: resident/CRNA  Preanesthetic Checklist Completed: patient identified, site marked, surgical consent, pre-op evaluation, timeout performed, IV checked, risks and benefits discussed and monitors and equipment checked Spinal Block Patient position: sitting Prep: DuraPrep Patient monitoring: heart rate, continuous pulse ox and blood pressure Approach: right paramedian Location: L2-3 Injection technique: single-shot Needle Needle type: Pencan  Needle gauge: 24 G Needle length: 9 cm Assessment Sensory level: T6 Additional Notes Expiration date of kit checked and confirmed. Patient tolerated procedure well, without complications. X 1 attempt with noted clear CSF return. Loss of motor and sensory on exam post injection.

## 2017-09-03 NOTE — Op Note (Signed)
DATE OF SURGERY:  09/03/2017  TIME: 5:56 PM  PATIENT NAME:  Alexis Hines    AGE: 84 y.o.   PRE-OPERATIVE DIAGNOSIS:  Right knee osteoarthritis  POST-OPERATIVE DIAGNOSIS:  Right knee osteoarthritis  PROCEDURE:  Procedure(s): RIGHT TOTAL KNEE ARTHROPLASTY  SURGEON:  Jamiyla Ishee ANDREW  ASSISTANT:  Bryson Stilwell, PA-C, present and scrubbed throughout the case, critical for assistance with exposure, retraction, instrumentation, and closure.  OPERATIVE IMPLANTS: Depuy PFC Sigma Rotating Platform.  Femur size 2.5, Tibia size 2, Patella size 32 3-peg oval button, with a 10 mm polyethylene insert.   PREOPERATIVE INDICATIONS:   Alexis Hines is a 84 y.o. year old female with end stage bone on bone arthritis of the knee who failed conservative treatment and elected for Total Knee Arthroplasty.   The risks, benefits, and alternatives were discussed at length including but not limited to the risks of infection, bleeding, nerve injury, stiffness, blood clots, the need for revision surgery, cardiopulmonary complications, among others, and they were willing to proceed.  OPERATIVE DESCRIPTION:  The patient was brought to the operative room and placed in a supine position.  Spinal anesthesia was administered.  IV antibiotics were given.  The lower extremity was prepped and draped in the usual sterile fashion.  Time out was performed.  The leg was elevated and exsanguinated and the tourniquet was inflated.  Anterior quadriceps tendon splitting approach was performed.  The patella was retracted and osteophytes were removed.  The anterior horn of the medial and lateral meniscus was removed and cruciate ligaments resected.   The distal femur was opened with the drill and the intramedullary distal femoral cutting jig was utilized, set at 5 degrees resecting 10 mm off the distal femur.  Care was taken to protect the collateral ligaments.  The distal femoral sizing jig was applied, taking  care to avoid notching.  Then the 4-in-1 cutting jig was applied and the anterior and posterior femur was cut, along with the chamfer cuts.    Then the extramedullary tibial cutting jig was utilized making the appropriate cut using the anterior tibial crest as a reference building in appropriate posterior slope.  Care was taken during the cut to protect the medial and collateral ligaments.  The proximal tibia was removed along with the posterior horns of the menisci.   The posterior medial femoral osteophytes and posterior lateral femoral osteophytes were removed.    The flexion gap was then measured and was symmetric with the extension gap, measured at 10.  I completed the distal femoral preparation using the appropriate jig to prepare the box.  The patella was then measured, and cut with the saw.    The proximal tibia sized and prepared accordingly with the reamer and the punch, and then all components were trialed with the trial insert.  The knee was found to have excellent balance and full motion.    The above named components were then cemented into place and all excess cement was removed.  The trial polyethylene component was in place during cementation, and then was exchanged for the real polyethylene component.    The knee was easily taken through a range of motion and the patella tracked well and the knee irrigated copiously and the parapatellar and subcutaneous tissue closed with vicryl, and monocryl with steri strips for the skin.  The arthrotomy was closed at 90 of flexion. The wounds were dressed with sterile gauze and the tourniquet released and the patient was awakened and returned to the PACU  in stable and satisfactory condition.  There were no complications.  Total tourniquet time was 65  minutes.

## 2017-09-03 NOTE — Anesthesia Preprocedure Evaluation (Signed)
Anesthesia Evaluation  Patient identified by MRN, date of birth, ID band Patient awake    Reviewed: Allergy & Precautions, NPO status , Patient's Chart, lab work & pertinent test results  Airway Mallampati: I  TM Distance: >3 FB Neck ROM: Full    Dental   Pulmonary    Pulmonary exam normal        Cardiovascular Normal cardiovascular exam     Neuro/Psych Anxiety    GI/Hepatic   Endo/Other    Renal/GU      Musculoskeletal   Abdominal   Peds  Hematology   Anesthesia Other Findings   Reproductive/Obstetrics                             Anesthesia Physical Anesthesia Plan  ASA: II  Anesthesia Plan: Spinal   Post-op Pain Management:  Regional for Post-op pain   Induction: Intravenous  PONV Risk Score and Plan: 2 and Treatment may vary due to age or medical condition  Airway Management Planned: Simple Face Mask  Additional Equipment:   Intra-op Plan:   Post-operative Plan:   Informed Consent: I have reviewed the patients History and Physical, chart, labs and discussed the procedure including the risks, benefits and alternatives for the proposed anesthesia with the patient or authorized representative who has indicated his/her understanding and acceptance.     Plan Discussed with: CRNA and Surgeon  Anesthesia Plan Comments:         Anesthesia Quick Evaluation

## 2017-09-03 NOTE — Interval H&P Note (Signed)
History and Physical Interval Note:  09/03/2017 1:24 PM  Alexis Hines  has presented today for surgery, with the diagnosis of Right knee osteoarthritis  The various methods of treatment have been discussed with the patient and family. After consideration of risks, benefits and other options for treatment, the patient has consented to  Procedure(s): RIGHT TOTAL KNEE ARTHROPLASTY (Right) as a surgical intervention .  The patient's history has been reviewed, patient examined, no change in status, stable for surgery.  I have reviewed the patient's chart and labs.  Questions were answered to the patient's satisfaction.     Alexis Hines ANDREW

## 2017-09-03 NOTE — Progress Notes (Signed)
AssistedDr. Ossey with right, ultrasound guided, adductor canal block. Side rails up following procedure, monitors on throughout procedure. See vital signs in flow sheet. Tolerated Procedure well.  

## 2017-09-03 NOTE — Transfer of Care (Signed)
Immediate Anesthesia Transfer of Care Note  Patient: Alexis Hines  Procedure(s) Performed: Procedure(s): RIGHT TOTAL KNEE ARTHROPLASTY (Right)  Patient Location: PACU  Anesthesia Type:Spinal  Level of Consciousness:  sedated, patient cooperative and responds to stimulation  Airway & Oxygen Therapy:Patient Spontanous Breathing and Patient connected to face mask oxgen  Post-op Assessment:  Report given to PACU RN and Post -op Vital signs reviewed and stable  Post vital signs:  Reviewed and stable  Last Vitals:  Vitals:   09/03/17 1546 09/03/17 1559  BP: (!) 147/77   Pulse: 72 70  Resp: 19 18  Temp:    SpO2: 22% 979%    Complications: No apparent anesthesia complications

## 2017-09-03 NOTE — Interval H&P Note (Signed)
History and Physical Interval Note:  09/03/2017 3:26 PM  Alexis Hines  has presented today for surgery, with the diagnosis of Right knee osteoarthritis  The various methods of treatment have been discussed with the patient and family. After consideration of risks, benefits and other options for treatment, the patient has consented to  Procedure(s): RIGHT TOTAL KNEE ARTHROPLASTY (Right) as a surgical intervention .  The patient's history has been reviewed, patient examined, no change in status, stable for surgery.  I have reviewed the patient's chart and labs.  Questions were answered to the patient's satisfaction.     Chigozie Basaldua ANDREW

## 2017-09-03 NOTE — Anesthesia Procedure Notes (Signed)
Anesthesia Regional Block: Adductor canal block   Pre-Anesthetic Checklist: ,, timeout performed, Correct Patient, Correct Site, Correct Laterality, Correct Procedure, Correct Position, site marked, Risks and benefits discussed,  Surgical consent,  Pre-op evaluation,  At surgeon's request and post-op pain management  Laterality: Right  Prep: chloraprep       Needles:  Injection technique: Single-shot  Needle Type: Echogenic Stimulator Needle     Needle Length: 9cm  Needle Gauge: 21     Additional Needles:   Narrative:  Start time: 09/03/2017 3:04 PM End time: 09/03/2017 3:14 PM  Performed by: Personally  Anesthesiologist: Lillia Abed, MD  Additional Notes: Monitors applied. Patient sedated. Sterile prep and drape,hand hygiene and sterile gloves were used. Relevant anatomy identified.Needle position confirmed.Local anesthetic injected incrementally after negative aspiration. Local anesthetic spread visualized around nerve(s). Vascular puncture avoided. No complications. Image printed for medical record.The patient tolerated the procedure well.    Lillia Abed MD

## 2017-09-03 NOTE — Anesthesia Postprocedure Evaluation (Signed)
Anesthesia Post Note  Patient: Alexis Hines  Procedure(s) Performed: RIGHT TOTAL KNEE ARTHROPLASTY (Right Knee)     Patient location during evaluation: PACU Anesthesia Type: Spinal Level of consciousness: oriented and awake and alert Pain management: pain level controlled Vital Signs Assessment: post-procedure vital signs reviewed and stable Respiratory status: spontaneous breathing, respiratory function stable and patient connected to nasal cannula oxygen Cardiovascular status: blood pressure returned to baseline and stable Postop Assessment: no headache, no backache and no apparent nausea or vomiting Anesthetic complications: no    Last Vitals:  Vitals:   09/03/17 1830 09/03/17 1845  BP: (!) 106/58 120/60  Pulse: 80 75  Resp: 14 19  Temp:    SpO2: 100% 100%    Last Pain:  Vitals:   09/03/17 1845  TempSrc:   PainSc: 0-No pain                 Jacion Dismore DAVID

## 2017-09-04 LAB — BASIC METABOLIC PANEL
Anion gap: 9 (ref 5–15)
BUN: 16 mg/dL (ref 6–20)
CALCIUM: 8.4 mg/dL — AB (ref 8.9–10.3)
CO2: 27 mmol/L (ref 22–32)
CREATININE: 0.82 mg/dL (ref 0.44–1.00)
Chloride: 104 mmol/L (ref 101–111)
GFR calc Af Amer: >60 mL/min (ref >60)
Glucose, Bld: 159 mg/dL — ABNORMAL HIGH (ref 65–99)
Potassium: 4.6 mmol/L (ref 3.5–5.1)
SODIUM: 140 mmol/L (ref 135–145)

## 2017-09-04 LAB — CBC
HCT: 40.5 % (ref 36.0–46.0)
Hemoglobin: 13.6 g/dL (ref 12.0–15.0)
MCH: 31.1 pg (ref 26.0–34.0)
MCHC: 33.6 g/dL (ref 30.0–36.0)
MCV: 92.7 fL (ref 78.0–100.0)
PLATELETS: 253 10*3/uL (ref 150–400)
RBC: 4.37 MIL/uL (ref 3.87–5.11)
RDW: 12.1 % (ref 11.5–15.5)
WBC: 10.1 10*3/uL (ref 4.0–10.5)

## 2017-09-04 NOTE — Evaluation (Signed)
Physical Therapy Evaluation Patient Details Name: Alexis Hines MRN: [id number] DOB: 1933/05/20 Today's Date: 09/04/2017   History of Present Illness  84yo female who failed conservative treatment for knee pain, received R TKR on 09/03/17. PMH hx skin CA, hx L TKR   Clinical Impression  Patient received in bed, very pleasant and willing to participate with PT however appearing anxious regarding general mobility and especially with self care once she returns home. R knee AROM 17 degrees extension to 90 degrees flexion today. She is able to complete functional bed mobility with S, and functional transfers and gait with Min guard today. SpO2 remained between 90-91% with activity on room air. She was left up in the chair with all needs met and questions/concerns addressed this morning. She will benefit from skilled PT services in the acute setting as well as further skilled rehabilitation services as per surgeon's recommendation moving forward.     Follow Up Recommendations Follow surgeon's recommendation for DC plan and follow-up therapies    Equipment Recommendations  Rolling walker with 5" wheels;3in1 (PT)    Recommendations for Other Services       Precautions / Restrictions Precautions Precautions: Knee Precaution Booklet Issued: No Precaution Comments: verbally reviewed  Required Braces or Orthoses: Knee Immobilizer - Right Knee Immobilizer - Right: On at all times Restrictions Weight Bearing Restrictions: Yes RLE Weight Bearing: Weight bearing as tolerated      Mobility  Bed Mobility Overal bed mobility: Needs Assistance Bed Mobility: Supine to Sit     Supine to sit: Supervision;HOB elevated     General bed mobility comments: S for safety, extended time, HOB elevated   Transfers Overall transfer level: Needs assistance Equipment used: Rolling walker (2 wheeled) Transfers: Sit to/from Stand Sit to Stand: Min guard         General transfer comment: Min guard for  safety, VC for technique and safety   Ambulation/Gait Ambulation/Gait assistance: Min guard Ambulation Distance (Feet): 80 Feet Assistive device: Rolling walker (2 wheeled) Gait Pattern/deviations: Step-to pattern;Decreased step length - left;Decreased stance time - right;Decreased dorsiflexion - right;Decreased weight shift to right     General Gait Details: cues for correct technique with gait and RW, min guard for safety; SpO2 remained between 90-91% with activity on room air   Stairs            Wheelchair Mobility    Modified Rankin (Stroke Patients Only)       Balance Overall balance assessment: No apparent balance deficits (not formally assessed)                                           Pertinent Vitals/Pain Pain Assessment: 0-10 Pain Score: 2  Pain Location: R knee  Pain Descriptors / Indicators: Aching;Sore Pain Intervention(s): Limited activity within patient's tolerance;Monitored during session;RN gave pain meds during session    [place] expects to be discharged to:: Private residence Living Arrangements: Alone Available Help at Discharge: Family;Available PRN/intermittently Type of Home: Other(Comment)(condo ) Home Access: Stairs to enter Entrance Stairs-Rails: None Entrance Stairs-Number of Steps: two single steps to navigate Home Layout: One level Home Equipment: Walker - 2 wheels;Tub bench      Prior Function Level of Independence: Independent               Hand Dominance        Extremity/Trunk Assessment  Upper Extremity Assessment Upper Extremity Assessment: Defer to OT evaluation    Lower Extremity Assessment Lower Extremity Assessment: Generalized weakness    Cervical / Trunk Assessment Cervical / Trunk Assessment: Normal  Communication   Communication: No difficulties  Cognition Arousal/Alertness: Awake/alert Behavior During Therapy: WFL for tasks assessed/performed;Anxious Overall  Cognitive Status: Within Functional Limits for tasks assessed                                        General Comments General comments (skin integrity, edema, etc.): SpO2 remained between 90-91% with activity on room air     Exercises Total Joint Exercises Goniometric ROM: R knee AROM 17 degrees extension to 90 degrees flexion    Assessment/Plan    PT Assessment Patient needs continued PT services  PT Problem List Decreased strength;Decreased mobility;Decreased safety awareness;Decreased range of motion;Decreased coordination;Decreased knowledge of precautions;Decreased activity tolerance;Decreased skin integrity;Decreased balance;Decreased knowledge of use of DME;Pain       PT Treatment Interventions DME instruction;Therapeutic activities;Gait training;Therapeutic exercise;Patient/family education;Stair training;Balance training;Functional mobility training;Neuromuscular re-education;Manual techniques    PT Goals (Current goals can be found in the Care Plan section)  Acute Rehab PT Goals Patient Stated Goal: to go home  PT Goal Formulation: With patient Time For Goal Achievement: 09/18/17 Potential to Achieve Goals: Good    Frequency 7X/week   Barriers to discharge        Co-evaluation               AM-PAC PT "6 Clicks" Daily Activity  Outcome Measure Difficulty turning over in bed (including adjusting bedclothes, sheets and blankets)?: None Difficulty moving from lying on back to sitting on the side of the bed? : None Difficulty sitting down on and standing up from a chair with arms (e.g., wheelchair, bedside commode, etc,.)?: A Little Help needed moving to and from a bed to chair (including a wheelchair)?: A Little Help needed walking in hospital room?: A Little Help needed climbing 3-5 steps with a railing? : A Little 6 Click Score: 20    End of Session Equipment Utilized During Treatment: Gait belt Activity Tolerance: Patient tolerated  treatment well Patient left: in chair;with call bell/phone within reach   PT Visit Diagnosis: Muscle weakness (generalized) (M62.81);Difficulty in walking, not elsewhere classified (R26.2);Pain Pain - Right/Left: Right Pain - part of body: Knee    Time: 2703-5009 PT Time Calculation (min) (ACUTE ONLY): 45 min   Charges:   PT Evaluation $PT Eval Low Complexity: 1 Low PT Treatments $Gait Training: 8-22 mins $Self Care/Home Management: 8-22   PT G Codes:        Deniece Ree PT, DPT, CBIS  Supplemental Physical Therapist [place]   Pager [phone]

## 2017-09-04 NOTE — Progress Notes (Signed)
Subjective: 1 Day Post-Op Procedure(s) (LRB): RIGHT TOTAL KNEE ARTHROPLASTY (Right)  Patient reports pain as mild to moderate.  Tolerating POs well.  Reports that she has not had a BM yet, but this is not atypical as she struggles with constipation at home.  She denies abdominal pain.  Denies fever, chills, N/V, CP, SOB.  Objective:   VITALS:  Temp:  [97.5 F (36.4 C)-98.4 F (36.9 C)] 97.6 F (36.4 C) (06/08 0610) Pulse Rate:  [61-86] 61 (06/08 0610) Resp:  [13-30] 20 (06/08 0610) BP: (94-166)/(54-89) 103/69 (06/08 0610) SpO2:  [90 %-100 %] 94 % (06/08 0610) Weight:  [61.5 kg (135 lb 9.6 oz)] 61.5 kg (135 lb 9.6 oz) (06/07 1335)  General: WDWN patient in NAD. Psych:  Appropriate mood and affect. Neuro:  A&O x 3, Moving all extremities, sensation intact to light touch HEENT:  EOMs intact ABD: nontender to palpation Chest:  Even non-labored respirations Skin:  Dressing/KI C/D/I, no rashes or lesions Extremities: warm/dry, mild edema, no erythema or echymosis.  No lymphadenopathy. Pulses: Femoral 2+ MSK:  ROM: lacks 5 degrees of TKE, MMT: able to perform quad set, (-) Homan's    LABS Recent Labs    09/04/17 0551  HGB 13.6  WBC 10.1  PLT 253   Recent Labs    09/04/17 0551  NA 140  K 4.6  CL 104  CO2 27  BUN 16  CREATININE 0.82  GLUCOSE 159*   No results for input(s): LABPT, INR in the last 72 hours.   Assessment/Plan: 1 Day Post-Op Procedure(s) (LRB): RIGHT TOTAL KNEE ARTHROPLASTY (Right)  Patient seen in rounds for Dr. Karna Dupes R LE Up with therapy Drain pulled Patient is on colace, miralax, and had mag-cit for constipation.  Dilaudid D/C'd.  Advised the patient to back of off narcotics in order to promote BM. Plan for 2 week outpatient post-op visit with Dr. Theda Sers D/C home tomorrow.   Mechele Claude PA-C [facility] Office:  [phone]

## 2017-09-04 NOTE — Progress Notes (Signed)
Physical Therapy Treatment Patient Details Name: Alexis Hines MRN: [id number] DOB: [birth year] Today's Date: 09/04/2017    History of Present Illness 84yo female who failed conservative treatment for knee pain, received R TKR on 09/03/17. PMH hx skin CA, hx L TKR     PT Comments    Patient received up in chair, very pleasant and willing to participate in PT session this afternoon. She is able to perform functional transfers with min guard, and able to progress ambulation to step through pattern approximately 193f today. Introduced sIT trainerwith good retention and carry-over from patient, and she was able to navigate a single step with min guard and almost no cuing from PT on third attempt. She was left up in the chair with all needs met and all concerns/questions addressed this afternoon.     Follow Up Recommendations  Follow surgeon's recommendation for DC plan and follow-up therapies     Equipment Recommendations  Rolling walker with 5" wheels;3in1 (PT)    Recommendations for Other Services       Precautions / Restrictions Precautions Precautions: Knee Precaution Booklet Issued: No Precaution Comments: verbally reviewed  Required Braces or Orthoses: Knee Immobilizer - Right Knee Immobilizer - Right: On at all times Restrictions Weight Bearing Restrictions: Yes RLE Weight Bearing: Weight bearing as tolerated    Mobility  Bed Mobility               General bed mobility comments: DNT, up in chair/returned to chair   Transfers Overall transfer level: Needs assistance Equipment used: Rolling walker (2 wheeled) Transfers: Sit to/from Stand Sit to Stand: Min guard         General transfer comment: Min guard for safety, VC for technique and safety   Ambulation/Gait Ambulation/Gait assistance: Min guard Ambulation Distance (Feet): 150 Feet Assistive device: Rolling walker (2 wheeled) Gait Pattern/deviations: Step-to pattern;Decreased step length -  left;Decreased stance time - right;Decreased dorsiflexion - right;Decreased weight shift to right     General Gait Details: worked on heel toe pattern and progressed to step through gait mechanics with good demonstration and carryover noted from patient    Stairs Stairs: Yes Stairs assistance: Min guard Stair Management: No rails;Forwards;With walker Number of Stairs: 1 General stair comments: practiced ascent/descent of 1 step 3 times with min guard, RW, cues for gait mechanics, patient able to perform correct pattern with minimal cues on third attempt    Wheelchair Mobility    Modified Rankin (Stroke Patients Only)       Balance Overall balance assessment: No apparent balance deficits (not formally assessed)                                          Cognition Arousal/Alertness: Awake/alert Behavior During Therapy: WFL for tasks assessed/performed;Anxious Overall Cognitive Status: Within Functional Limits for tasks assessed                                 General Comments: appears to be cognitively intact but very anxious and often asks repetitive questions       Exercises Total Joint Exercises Quad Sets: Right;10 reps;Seated Short Arc Quad: Right;10 reps;Seated Heel Slides: Right;10 reps;Seated Hip ABduction/ADduction: Right;10 reps;Seated Straight Leg Raises: Right;10 reps;Seated    General Comments        Pertinent Vitals/Pain Pain Assessment: 0-10 Pain  Score: 5  Pain Location: R knee  Pain Descriptors / Indicators: Aching;Sore Pain Intervention(s): Limited activity within patient's tolerance;Monitored during session    Home Living                      Prior Function            PT Goals (current goals can now be found in the care plan section) Acute Rehab PT Goals Patient Stated Goal: to go home  PT Goal Formulation: With patient Time For Goal Achievement: 09/18/17 Potential to Achieve Goals: Good Progress  towards PT goals: Progressing toward goals    Frequency    7X/week      PT Plan Current plan remains appropriate    Co-evaluation              AM-PAC PT "6 Clicks" Daily Activity  Outcome Measure  Difficulty turning over in bed (including adjusting bedclothes, sheets and blankets)?: None Difficulty moving from lying on back to sitting on the side of the bed? : None Difficulty sitting down on and standing up from a chair with arms (e.g., wheelchair, bedside commode, etc,.)?: A Little Help needed moving to and from a bed to chair (including a wheelchair)?: A Little Help needed walking in hospital room?: A Little Help needed climbing 3-5 steps with a railing? : A Little 6 Click Score: 20    End of Session   Activity Tolerance: Patient tolerated treatment well Patient left: in chair;with call bell/phone within reach   PT Visit Diagnosis: Muscle weakness (generalized) (M62.81);Difficulty in walking, not elsewhere classified (R26.2);Pain Pain - Right/Left: Right Pain - part of body: Knee     Time: 1420-1500 PT Time Calculation (min) (ACUTE ONLY): 40 min  Charges:  $Gait Training: 23-37 mins $Therapeutic Exercise: 8-22 mins                    G Codes:       Deniece Ree PT, DPT, CBIS  Supplemental Physical Therapist [place]   Pager [phone]

## 2017-09-05 LAB — CBC
HEMATOCRIT: 37.8 % (ref 36.0–46.0)
Hemoglobin: 12.4 g/dL (ref 12.0–15.0)
MCH: 30.2 pg (ref 26.0–34.0)
MCHC: 32.8 g/dL (ref 30.0–36.0)
MCV: 92.2 fL (ref 78.0–100.0)
Platelets: 266 10*3/uL (ref 150–400)
RBC: 4.1 MIL/uL (ref 3.87–5.11)
RDW: 12.6 % (ref 11.5–15.5)
WBC: 12.9 10*3/uL — ABNORMAL HIGH (ref 4.0–10.5)

## 2017-09-05 MED ORDER — ALPRAZOLAM 0.25 MG PO TABS
0.1250 mg | ORAL_TABLET | Freq: Two times a day (BID) | ORAL | Status: DC | PRN
  Administered 2017-09-05 (×2): 0.25 mg via ORAL
  Filled 2017-09-05 (×2): qty 1

## 2017-09-05 NOTE — Progress Notes (Signed)
Alexis Hines  MRN: [id number] DOB/Age: 09/22/1933 84 y.o. [facility] Procedure: Procedure(s) (LRB): RIGHT TOTAL KNEE ARTHROPLASTY (Right)     Subjective: Patient reports a very difficult night with pain. She tried to sleep on side which caused increased pain and she is concerned she hurt her knee. She also expresses multiple concerns and fears regarding going home.  Vital Signs Temp:  [98.2 F (36.8 C)-98.6 F (37 C)] 98.6 F (37 C) (06/09 0626) Pulse Rate:  [55-71] 63 (06/09 0626) Resp:  [14-18] 14 (06/08 1343) BP: (106-131)/(55-64) 131/61 (06/09 0626) SpO2:  [89 %-93 %] 92 % (06/09 0626)  Lab Results Recent Labs    09/04/17 0551 09/05/17 0506  WBC 10.1 12.9*  HGB 13.6 12.4  HCT 40.5 37.8  PLT 253 266   BMET Recent Labs    09/04/17 0551  NA 140  K 4.6  CL 104  CO2 27  GLUCOSE 159*  BUN 16  CREATININE 0.82  CALCIUM 8.4*   INR  Date Value Ref Range Status  08/27/2017 0.98  Final    Comment:    Performed at [facility], [place] [address], [place], [place] [address]     Exam Right knee with minimal swelling Dressing intact with no active bleeding. Calf soft all compartments soft. Tolerates gentle ROM and moves foot and ankle well.        Plan Will continue to work with PT/OT and evaluate her safety and disposition goals Reassured patient that her knee exam is as I would expect and I do not see anything that an xray would be warranted but after PT works with her if there are still concerns we can order one. I have encouraged her out of bed and to continue to work on mobility and only take her pain meds as needed to help her bowels.  Kyser Wandel PA-C  09/05/2017, 9:20 AM Contact # [phone]

## 2017-09-05 NOTE — Progress Notes (Signed)
Physical Therapy Treatment Patient Details Name: Alexis Hines MRN: [id number] DOB: Jul 03, 1933 Today's Date: 09/05/2017    History of Present Illness 84yo female who failed conservative treatment for knee pain, received R TKR on 09/03/17. PMH hx skin CA, hx L TKR     PT Comments    POD # 2 pm session Pt c/o increased fatigue.  General transfer comment: 50% VC's on safety with turns and proper walker to self distance.  Assisted to bathroom pt required assist to maneuver walker correctly around obsticles and turning to toilet.  Pt anxious/distarcted and required repeat VC's for safety with hand placement and turn completion.  Near fall in bathroom when attempting to self pull down underpants.  Therapist recovered.  Pt struggled to maintain a safe balance during self hygiene after voided.  Again, therapist recovered forward LOB.  Pt letting go of grab bar and trying to perform task to quickly.  HIGH FALL RISK.  General Gait Details: Very unsteady gait with impaired safety cognition and poor balance correction.  Required assist to proper maneuver walker around bed through bathroom door.  Pt tipping walker and moving walker same time she is stepping.  Poor self correction to midline and poor reaction responce.,   HIGH FALL RISK.   Follow Up Recommendations  SNF  pt lives home alone   Equipment Recommendations  Rolling walker with 5" wheels;3in1 (PT)    Recommendations for Other Services       Precautions / Restrictions Precautions Precautions: Knee;Fall Precaution Booklet Issued: No Precaution Comments: did not use KI this session Required Braces or Orthoses: Knee Immobilizer - Right Knee Immobilizer - Right: Discontinue once straight leg raise with < 10 degree lag Restrictions Weight Bearing Restrictions: No RLE Weight Bearing: Weight bearing as tolerated    Mobility  Bed Mobility Overal bed mobility: Needs Assistance Bed Mobility: Sit to Supine       Sit to supine: Min assist    General bed mobility comments: assist R LE up onto bed as pt was unable   Transfers Overall transfer level: Needs assistance Equipment used: Rolling walker (2 wheeled) Transfers: Sit to/from [facility] Sit to Stand: Min guard;Min assist Stand pivot transfers: Min assist;Mod assist       General transfer comment: 50% VC's on safety with turns and proper walker to self distance.  Assisted to bathroom pt required assist to maneuver walker correctly around obsticles and turning to toilet.  Pt anxious/distarcted and required repeat VC's for safety with hand placement and turn completion.  Near fall in bathroom when attempting to self pull down underpants.  Therapist recovered.  Pt struggled to maintain a safe balance during self hygiene after voided.  Again, therapist recovered forward LOB.  Pt letting go of grab bar and trying to perform task to quickly.  HIGH FALL RISK.    Ambulation/Gait Ambulation/Gait assistance: Min guard;Min assist Ambulation Distance (Feet): 23 Feet(to and from bathroom only due to increased c/o fatigue this afternoon.  ) Assistive device: Rolling walker (2 wheeled) Gait Pattern/deviations: Step-to pattern;Decreased step length - left;Decreased stance time - right;Decreased dorsiflexion - right;Decreased weight shift to right;Shuffle Gait velocity: decreased   General Gait Details: Very unsteady gait with impaired safety cognition and poor balance correction.  Required assist to proper maneuver walker around bed through bathroom door.  Pt tipping walker and moving walker same time she is stepping.  Poor self correction to midline and poor reaction responce.,   HIGH FALL RISK.    Stairs  Wheelchair Mobility    Modified Rankin (Stroke Patients Only)       Balance                                            Cognition Arousal/Alertness: Awake/alert Behavior During Therapy: WFL for tasks  assessed/performed Overall Cognitive Status: Within Functional Limits for tasks assessed                                 General Comments: required repeat VC's for safety in bathroom      Exercises      General Comments        Pertinent Vitals/Pain Pain Assessment: 0-10 Pain Score: 7  Pain Location: R knee Pain Descriptors / Indicators: Aching;Sore;Operative site guarding Pain Intervention(s): Monitored during session;Repositioned;Ice applied;Premedicated before session    Home Living                      Prior Function            PT Goals (current goals can now be found in the care plan section) Acute Rehab PT Goals Patient Stated Goal: Rehab to regain IND and return home PT Goal Formulation: With patient Time For Goal Achievement: 09/18/17 Potential to Achieve Goals: Good Progress towards PT goals: Progressing toward goals    Frequency    7X/week      PT Plan Discharge plan needs to be updated;Current plan remains appropriate    Co-evaluation              AM-PAC PT "6 Clicks" Daily Activity  Outcome Measure  Difficulty turning over in bed (including adjusting bedclothes, sheets and blankets)?: A Little Difficulty moving from lying on back to sitting on the side of the bed? : A Little Difficulty sitting down on and standing up from a chair with arms (e.g., wheelchair, bedside commode, etc,.)?: A Lot Help needed moving to and from a bed to chair (including a wheelchair)?: A Lot Help needed walking in hospital room?: A Lot Help needed climbing 3-5 steps with a railing? : Total 6 Click Score: 13    End of Session Equipment Utilized During Treatment: Gait belt Activity Tolerance: Patient limited by fatigue Patient left: in bed;with call bell/phone within reach;with bed alarm set Nurse Communication: Mobility status PT Visit Diagnosis: Muscle weakness (generalized) (M62.81);Difficulty in walking, not elsewhere classified  (R26.2);Pain Pain - Right/Left: Right Pain - part of body: Knee     Time: 1335-1400 PT Time Calculation (min) (ACUTE ONLY): 25 min  Charges:  $Gait Training: 8-22 mins $Therapeutic Activity: 8-22 mins                    G Codes:       Rica Koyanagi  PTA [facility]  Acute  Rehab Pager      [phone]

## 2017-09-05 NOTE — Discharge Summary (Signed)
Physician Discharge Summary  Patient ID: Alexis Hines MRN: [id number] DOB/AGE: 1934/01/03 84 y.o.  Admit date: 09/03/2017 Discharge date: 09/06/17 Admission Diagnoses: knee OA  Discharge Diagnoses:  Active Problems:   Primary osteoarthritis of right knee   S/P knee replacement   Discharged Condition: good  Hospital Course:  Alexis Hines is a 84 y.o. who was admitted to [facility]. They were brought to the operating room on 09/03/2017 and underwent Procedure(s): RIGHT TOTAL KNEE ARTHROPLASTY.  Patient tolerated the procedure well and was later transferred to the recovery room and then to the orthopaedic floor for postoperative care.  They were given PO and IV analgesics for pain control following their surgery.  They were given 24 hours of postoperative antibiotics of  Anti-infectives (From admission, onward)   Start     Dose/Rate Route Frequency Ordered Stop   09/04/17 0600  ceFAZolin (ANCEF) IVPB 2g/100 mL premix     2 g 200 mL/hr over 30 Minutes Intravenous On call to O.R. 09/03/17 1331 09/03/17 1630   09/03/17 2230  ceFAZolin (ANCEF) IVPB 2g/100 mL premix     2 g 200 mL/hr over 30 Minutes Intravenous Every 6 hours 09/03/17 2020 09/04/17 0441     and started on DVT prophylaxis in the form of lovenox.   PT and OT were ordered for total joint protocol.  Discharge planning consulted to help with postop disposition and equipment needs.  Patient had a fair night on the evening of surgery and started to get up OOB with therapy on day one.  Hemovac drain was pulled without difficulty.  Continued to work with therapy into day two.  Dressing was with normal limits.  The patient had progressed with therapy and meeting their goals. Patient was seen in rounds and was ready to go home.  Consults: n/a  Significant Diagnostic Studies: routine  Treatments: routine  Discharge Exam: Blood pressure 123/63, pulse 64, temperature (!) 97.5 F (36.4 C), temperature source Oral, resp.  rate 13, height 4\' 11"  (1.499 m), weight 61.5 kg (135 lb 9.6 oz), SpO2 97 %. Well nourished. Alert and oriented x3. RRR, Lungs clear, BS x4. Abdomen soft and non tender. Right Calf soft and non tender. Right knee dressing C/D/I. No DVT signs. Compartment soft. No signs of infection.  Right LE grossly neurovascular intact.  Disposition: Discharge disposition: 03-Skilled Nursing Facility       Discharge Instructions    Call MD / Call 911   Complete by:  As directed    If you experience chest pain or shortness of breath, CALL 911 and be transported to the hospital emergency room.  If you develope a fever above 101 F, pus (white drainage) or increased drainage or redness at the wound, or calf pain, call your surgeon's office.   Constipation Prevention   Complete by:  As directed    Drink plenty of fluids.  Prune juice may be helpful.  You may use a stool softener, such as Colace (over the counter) 100 mg twice a day.  Use MiraLax (over the counter) for constipation as needed.   Diet - low sodium heart healthy   Complete by:  As directed    Discharge instructions   Complete by:  As directed    INSTRUCTIONS AFTER JOINT REPLACEMENT   Remove items at home which could result in a fall. This includes throw rugs or furniture in walking pathways ICE to the affected joint every three hours while awake for 30 minutes at a  time, for at least the first 3-5 days, and then as needed for pain and swelling.  Continue to use ice for pain and swelling. You may notice swelling that will progress down to the foot and ankle.  This is normal after surgery.  Elevate your leg when you are not up walking on it.   Continue to use the breathing machine you got in the hospital (incentive spirometer) which will help keep your temperature down.  It is common for your temperature to cycle up and down following surgery, especially at night when you are not up moving around and exerting yourself.  The breathing machine keeps  your lungs expanded and your temperature down.   DIET:  As you were doing prior to hospitalization, we recommend a well-balanced diet.  DRESSING / WOUND CARE / SHOWERING  Keep the surgical dressing until follow up.  The dressing is water proof, so you can shower without any extra covering.  IF THE DRESSING FALLS OFF or the wound gets wet inside, change the dressing with sterile gauze.  Please use good hand washing techniques before changing the dressing.  Do not use any lotions or creams on the incision until instructed by your surgeon.    ACTIVITY  Increase activity slowly as tolerated, but follow the weight bearing instructions below.   No driving for 6 weeks or until further direction given by your physician.  You cannot drive while taking narcotics.  No lifting or carrying greater than 10 lbs. until further directed by your surgeon. Avoid periods of inactivity such as sitting longer than an hour when not asleep. This helps prevent blood clots.  You may return to work once you are authorized by your doctor.     WEIGHT BEARING   Weight bearing as tolerated with assist device (walker, cane, etc) as directed, use it as long as suggested by your surgeon or therapist, typically at least 4-6 weeks.   EXERCISES  Results after joint replacement surgery are often greatly improved when you follow the exercise, range of motion and muscle strengthening exercises prescribed by your doctor. Safety measures are also important to protect the joint from further injury. Any time any of these exercises cause you to have increased pain or swelling, decrease what you are doing until you are comfortable again and then slowly increase them. If you have problems or questions, call your caregiver or physical therapist for advice.   Rehabilitation is important following a joint replacement. After just a few days of immobilization, the muscles of the leg can become weakened and shrink (atrophy).  These exercises  are designed to build up the tone and strength of the thigh and leg muscles and to improve motion. Often times heat used for twenty to thirty minutes before working out will loosen up your tissues and help with improving the range of motion but do not use heat for the first two weeks following surgery (sometimes heat can increase post-operative swelling).   These exercises can be done on a training (exercise) mat, on the floor, on a table or on a bed. Use whatever works the best and is most comfortable for you.    Use music or television while you are exercising so that the exercises are a pleasant break in your day. This will make your life better with the exercises acting as a break in your routine that you can look forward to.   Perform all exercises about fifteen times, three times per day or as directed.  You should exercise both the operative leg and the other leg as well.   Exercises include:   Quad Sets - Tighten up the muscle on the front of the thigh (Quad) and hold for 5-10 seconds.   Straight Leg Raises - With your knee straight (if you were given a brace, keep it on), lift the leg to 60 degrees, hold for 3 seconds, and slowly lower the leg.  Perform this exercise against resistance later as your leg gets stronger.  Leg Slides: Lying on your back, slowly slide your foot toward your buttocks, bending your knee up off the floor (only go as far as is comfortable). Then slowly slide your foot back down until your leg is flat on the floor again.  Angel Wings: Lying on your back spread your legs to the side as far apart as you can without causing discomfort.  Hamstring Strength:  Lying on your back, push your heel against the floor with your leg straight by tightening up the muscles of your buttocks.  Repeat, but this time bend your knee to a comfortable angle, and push your heel against the floor.  You may put a pillow under the heel to make it more comfortable if necessary.   A rehabilitation  program following joint replacement surgery can speed recovery and prevent re-injury in the future due to weakened muscles. Contact your doctor or a physical therapist for more information on knee rehabilitation.    CONSTIPATION  Constipation is defined medically as fewer than three stools per week and severe constipation as less than one stool per week.  Even if you have a regular bowel pattern at home, your normal regimen is likely to be disrupted due to multiple reasons following surgery.  Combination of anesthesia, postoperative narcotics, change in appetite and fluid intake all can affect your bowels.   YOU MUST use at least one of the following options; they are listed in order of increasing strength to get the job done.  They are all available over the counter, and you may need to use some, POSSIBLY even all of these options:    Drink plenty of fluids (prune juice may be helpful) and high fiber foods Colace 100 mg by mouth twice a day  Senokot for constipation as directed and as needed Dulcolax (bisacodyl), take with full glass of water  Miralax (polyethylene glycol) once or twice a day as needed.  If you have tried all these things and are unable to have a bowel movement in the first 3-4 days after surgery call either your surgeon or your primary doctor.    If you experience loose stools or diarrhea, hold the medications until you stool forms back up.  If your symptoms do not get better within 1 week or if they get worse, check with your doctor.  If you experience "the worst abdominal pain ever" or develop nausea or vomiting, please contact the office immediately for further recommendations for treatment.   ITCHING:  If you experience itching with your medications, try taking only a single pain pill, or even half a pain pill at a time.  You can also use Benadryl over the counter for itching or also to help with sleep.   TED HOSE STOCKINGS:  Use stockings on both legs until for at least 2  weeks or as directed by physician office. They may be removed at night for sleeping.  MEDICATIONS:  See your medication summary on the "After Visit Summary" that nursing will review with  you.  You may have some home medications which will be placed on hold until you complete the course of blood thinner medication.  It is important for you to complete the blood thinner medication as prescribed.  PRECAUTIONS:  If you experience chest pain or shortness of breath - call 911 immediately for transfer to the hospital emergency department.   If you develop a fever greater that 101 F, purulent drainage from wound, increased redness or drainage from wound, foul odor from the wound/dressing, or calf pain - CONTACT YOUR SURGEON.                                                   FOLLOW-UP APPOINTMENTS:  If you do not already have a post-op appointment, please call the office for an appointment to be seen by your surgeon.  Guidelines for how soon to be seen are listed in your "After Visit Summary", but are typically between 1-4 weeks after surgery.  OTHER INSTRUCTIONS:   Knee Replacement:  Do not place pillow under knee, focus on keeping the knee straight while resting. CPM instructions: 0-90 degrees, 2 hours in the morning, 2 hours in the afternoon, and 2 hours in the evening. Place foam block, curve side up under heel at all times except when in CPM or when walking.  DO NOT modify, tear, cut, or change the foam block in any way.  MAKE SURE YOU:  Understand these instructions.  Get help right away if you are not doing well or get worse.    Thank you for letting us be a part of your medical care team.  It is a privilege we respect greatly.  We hope these instructions will help you stay on track for a fast and full recovery!   Increase activity slowly as tolerated   Complete by:  As directed      Allergies as of 09/05/2017   No Known Allergies     Medication List    STOP taking these medications    ibuprofen 200 MG tablet Commonly known as:  ADVIL,MOTRIN   traMADol 50 MG tablet Commonly known as:  ULTRAM     TAKE these medications   ALPRAZolam 0.25 MG tablet Commonly known as:  XANAX Take 0.125-0.25 mg by mouth daily as needed for anxiety.   aspirin EC 325 MG tablet Take 1 tablet (325 mg total) by mouth 2 (two) times daily.   atorvastatin 40 MG tablet Commonly known as:  LIPITOR Take 40 mg by mouth at bedtime.   baclofen 10 MG tablet Commonly known as:  LIORESAL Take 1 tablet (10 mg total) by mouth 3 (three) times daily as needed for muscle spasms.   CALTRATE 600+D 600-400 MG-UNIT tablet Generic drug:  Calcium Carbonate-Vitamin D Take 1 tablet by mouth daily at 3 pm.   multivitamin with minerals Tabs tablet Take 1 tablet by mouth daily. CENTRUM MULTIVITAMIN   oxyCODONE 5 MG immediate release tablet Commonly known as:  ROXICODONE Take 1 tablet (5 mg total) by mouth every 4 (four) hours as needed for up to 7 days.        SignedLajean Manes 09/05/2017, 4:59 PM

## 2017-09-05 NOTE — Evaluation (Signed)
Occupational Therapy Evaluation Patient Details Name: Alexis Hines MRN: [id number] DOB: [birth year] Today's Date: 09/05/2017    History of Present Illness 84yo female who failed conservative treatment for knee pain, received R TKR on 09/03/17. PMH hx skin CA, hx L TKR    Clinical Impression   Patient is s/p R TKR surgery resulting in functional limitations due to the deficits listed below (see OT problem list). Pt currently requires Min (A) with transfer from bathroom to chair due to dizziness. Pt will be home alone with PRN family (A). Pt reports my daughter lives 30 minutes away and has kids to take care of too.  Patient will benefit from skilled OT acutely to increase independence and safety with ADLS to allow discharge SNF. Pt asking questions about rehab in the community throughout the session and could benefit from continued therapy due to fall risk.      Follow Up Recommendations  SNF    Equipment Recommendations  3 in 1 bedside commode    Recommendations for Other Services       Precautions / Restrictions Precautions Precautions: Knee Restrictions RLE Weight Bearing: Weight bearing as tolerated      Mobility Bed Mobility               General bed mobility comments: sitting EOB ON arrival  Transfers Overall transfer level: Needs assistance Equipment used: Rolling walker (2 wheeled) Transfers: Sit to/from Stand Sit to Stand: Min guard         General transfer comment: pt requires cues for hand placement with RW    Balance                                           ADL either performed or assessed with clinical judgement   ADL Overall ADL's : Needs assistance/impaired Eating/Feeding: Independent   Grooming: Independent   Upper Body Bathing: Independent   Lower Body Bathing: Min guard   Upper Body Dressing : Independent   Lower Body Dressing: Min guard   Toilet Transfer: Min Lobbyist Details (indicate cue  type and reason): pt will requires [facility] for hand placement         Functional mobility during ADLs: Min guard;Rolling walker General ADL Comments: pt completed full adl at eob as OT arriving. pt requires min guard (A) with transfer and then progressed to MIN (A) due to c/o dizziness. pt with BP 122/50 with ted hose don.      Vision   Vision Assessment?: No apparent visual deficits     Perception     Praxis      Pertinent Vitals/Pain Pain Assessment: 0-10 Pain Score: 5  Pain Location: R knee Pain Descriptors / Indicators: Aching;Sore Pain Intervention(s): Monitored during session;Premedicated before session;Repositioned;Ice applied     Hand Dominance Right   Extremity/Trunk Assessment Upper Extremity Assessment Upper Extremity Assessment: Overall WFL for tasks assessed   Lower Extremity Assessment Lower Extremity Assessment: Defer to PT evaluation   Cervical / Trunk Assessment Cervical / Trunk Assessment: Normal   Communication Communication Communication: HOH   Cognition Arousal/Alertness: Awake/alert Behavior During Therapy: WFL for tasks assessed/performed;Anxious Overall Cognitive Status: Within Functional Limits for tasks assessed  General Comments  pt c/o dizzines and BP checked this session VSS    Exercises     Shoulder Instructions      Home Living Family/patient expects to be discharged to:: Private residence Living Arrangements: Alone Available Help at Discharge: Family;Available PRN/intermittently Type of Home: Other(Comment) Home Access: Stairs to enter Entrance Stairs-Number of Steps: two single steps to navigate Entrance Stairs-Rails: None Home Layout: One level     Bathroom Shower/Tub: Teacher, early years/pre: Standard     Home Equipment: Environmental consultant - 2 wheels;Tub bench   Additional Comments: daughter lives 30 minutes away. will have 47 yo granddaughter stay with her at times  during recovery other wise alone. pt very much wants to know more about SNF level but reports "my doctor wants me to go home instead"      Prior Functioning/Environment Level of Independence: Independent                 OT Problem List: Decreased strength;Decreased activity tolerance;Impaired balance (sitting and/or standing);Decreased safety awareness;Decreased knowledge of precautions;Decreased knowledge of use of DME or AE;Pain      OT Treatment/Interventions: Self-care/ADL training;Therapeutic exercise;DME and/or AE instruction;Energy conservation;Therapeutic activities;Balance training;Patient/family education    OT Goals(Current goals can be found in the care plan section) Acute Rehab OT Goals Patient Stated Goal: to get rehab OT Goal Formulation: With patient Time For Goal Achievement: 09/19/17 Potential to Achieve Goals: Good  OT Frequency: Min 2X/week   Barriers to D/C: Decreased caregiver support  lives alone with PRN (A) family       Co-evaluation              AM-PAC PT "6 Clicks" Daily Activity     Outcome Measure Help from another person eating meals?: None Help from another person taking care of personal grooming?: None Help from another person toileting, which includes using toliet, bedpan, or urinal?: A Little Help from another person bathing (including washing, rinsing, drying)?: A Little Help from another person to put on and taking off regular upper body clothing?: None Help from another person to put on and taking off regular lower body clothing?: A Little 6 Click Score: 21   End of Session Equipment Utilized During Treatment: Gait belt;Rolling walker Nurse Communication: Mobility status;Precautions  Activity Tolerance: Patient tolerated treatment well Patient left: in chair;with call bell/phone within reach  OT Visit Diagnosis: Unsteadiness on feet (R26.81);Repeated falls (R29.6)                Time: 2778-2423 OT Time Calculation (min): 24  min Charges:  OT General Charges $OT Visit: 1 Visit OT Evaluation $OT Eval Moderate Complexity: 1 Mod G-Codes:      Jeri Modena   OTR/L Pager: [phone] Office: [phone] .   Parke Poisson B 09/05/2017, 12:07 PM

## 2017-09-05 NOTE — Progress Notes (Signed)
Physical Therapy Treatment Patient Details Name: Alexis Hines MRN: [id number] DOB: September 18, 1933 Today's Date: 09/05/2017    History of Present Illness 84yo female who failed conservative treatment for knee pain, received R TKR on 09/03/17. PMH hx skin CA, hx L TKR     PT Comments    Therex program performed with pt requesting rest break following 2* fatigue.   Follow Up Recommendations  Follow surgeon's recommendation for DC plan and follow-up therapies     Equipment Recommendations       Recommendations for Other Services       Precautions / Restrictions Precautions Precautions: Knee;Fall Precaution Booklet Issued: No Precaution Comments: verbally reviewed  Required Braces or Orthoses: Knee Immobilizer - Right Knee Immobilizer - Right: Discontinue once straight leg raise with < 10 degree lag Restrictions Weight Bearing Restrictions: No RLE Weight Bearing: Weight bearing as tolerated    Mobility  Bed Mobility               General bed mobility comments: sitting EOB ON arrival  Transfers Overall transfer level: Needs assistance Equipment used: Rolling walker (2 wheeled) Transfers: Sit to/from Stand Sit to Stand: Min guard         General transfer comment: pt requires cues for hand placement with RW  Ambulation/Gait                 Stairs             Wheelchair Mobility    Modified Rankin (Stroke Patients Only)       Balance                                            Cognition Arousal/Alertness: Awake/alert Behavior During Therapy: WFL for tasks assessed/performed;Anxious Overall Cognitive Status: Within Functional Limits for tasks assessed                                 General Comments: appears to be cognitively intact but very anxious and often asks repetitive questions       Exercises Total Joint Exercises Quad Sets: Right;15 reps;Supine;AROM Heel Slides: Right;AAROM;20  reps;Supine Straight Leg Raises: Right;AAROM;20 reps;Supine Goniometric ROM: AAROM R knee - 10 - 60 with muscle guarding    General Comments General comments (skin integrity, edema, etc.): pt c/o dizzines and BP checked this session VSS      Pertinent Vitals/Pain Pain Assessment: 0-10 Pain Score: 6  Pain Location: R knee Pain Descriptors / Indicators: Aching;Sore Pain Intervention(s): Limited activity within patient's tolerance;Monitored during session;Premedicated before session;Ice applied    Home Living Family/patient expects to be discharged to:: Private residence Living Arrangements: Alone Available Help at Discharge: Family;Available PRN/intermittently Type of Home: Other(Comment) Home Access: Stairs to enter Entrance Stairs-Rails: None Home Layout: One level Home Equipment: Walker - 2 wheels;Tub bench Additional Comments: daughter lives 30 minutes away. will have 62 yo granddaughter stay with her at times during recovery other wise alone. pt very much wants to know more about SNF level but reports "my doctor wants me to go home instead"    Prior Function Level of Independence: Independent          PT Goals (current goals can now be found in the care plan section) Acute Rehab PT Goals Patient Stated Goal: Rehab to regain IND and return home  PT Goal Formulation: With patient Time For Goal Achievement: 09/18/17 Potential to Achieve Goals: Good Progress towards PT goals: Progressing toward goals    Frequency    7X/week      PT Plan Current plan remains appropriate    Co-evaluation              AM-PAC PT "6 Clicks" Daily Activity  Outcome Measure  Difficulty turning over in bed (including adjusting bedclothes, sheets and blankets)?: A Little Difficulty moving from lying on back to sitting on the side of the bed? : A Little Difficulty sitting down on and standing up from a chair with arms (e.g., wheelchair, bedside commode, etc,.)?: A Little Help needed  moving to and from a bed to chair (including a wheelchair)?: A Little Help needed walking in hospital room?: A Little Help needed climbing 3-5 steps with a railing? : A Little 6 Click Score: 18    End of Session Equipment Utilized During Treatment: Gait belt Activity Tolerance: Patient tolerated treatment well Patient left: in chair;with call bell/phone within reach   PT Visit Diagnosis: Muscle weakness (generalized) (M62.81);Difficulty in walking, not elsewhere classified (R26.2);Pain Pain - Right/Left: Right Pain - part of body: Knee     Time: 1100-1125 PT Time Calculation (min) (ACUTE ONLY): 25 min  Charges:  $Therapeutic Exercise: 23-37 mins                    G Codes:       Pg [phone]    Uchenna Seufert 09/05/2017, 12:21 PM

## 2017-09-05 NOTE — Progress Notes (Signed)
Physical Therapy Treatment Patient Details Name: Alexis Hines MRN: [id number] DOB: [birth year] Today's Date: 09/05/2017    History of Present Illness 84yo female who failed conservative treatment for knee pain, received R TKR on 09/03/17. PMH hx skin CA, hx L TKR     PT Comments    Pt continues cooperative but fatigued and expressing concerns regarding ability to function at home without assistance.  Pt would benefit from follow up short-term rehab at SNF level to maximize IND and safety prior to return home with very limited assist.   Follow Up Recommendations  SNF     Equipment Recommendations       Recommendations for Other Services       Precautions / Restrictions Precautions Precautions: Knee;Fall Precaution Booklet Issued: No Precaution Comments: verbally reviewed  Required Braces or Orthoses: Knee Immobilizer - Right Knee Immobilizer - Right: Discontinue once straight leg raise with < 10 degree lag Restrictions Weight Bearing Restrictions: No RLE Weight Bearing: Weight bearing as tolerated    Mobility  Bed Mobility               General bed mobility comments: up in chair and requests back to same  Transfers Overall transfer level: Needs assistance Equipment used: Rolling walker (2 wheeled) Transfers: Sit to/from Stand Sit to Stand: Min guard         General transfer comment: cues for LE management, use of UEs to self assist;   Ambulation/Gait Ambulation/Gait assistance: Min guard Ambulation Distance (Feet): 111 Feet Assistive device: Rolling walker (2 wheeled) Gait Pattern/deviations: Step-to pattern;Decreased step length - left;Decreased stance time - right;Decreased dorsiflexion - right;Decreased weight shift to right;Shuffle Gait velocity: decr   General Gait Details: min cues for posture and position from [employer] Mobility    Modified Rankin (Stroke Patients Only)       Balance                                             Cognition Arousal/Alertness: Awake/alert Behavior During Therapy: WFL for tasks assessed/performed;Anxious Overall Cognitive Status: Within Functional Limits for tasks assessed                                 General Comments: appears to be cognitively intact but very anxious and often asks repetitive questions       Exercises Total Joint Exercises Quad Sets: Right;15 reps;Supine;AROM Heel Slides: Right;AAROM;20 reps;Supine Straight Leg Raises: Right;AAROM;20 reps;Supine Goniometric ROM: AAROM R knee - 10 - 60 with muscle guarding    General Comments General comments (skin integrity, edema, etc.): pt c/o dizzines and BP checked this session VSS      Pertinent Vitals/Pain Pain Assessment: 0-10 Pain Score: 6  Pain Location: R knee Pain Descriptors / Indicators: Aching;Sore Pain Intervention(s): Limited activity within patient's tolerance;Monitored during session;Premedicated before session;Ice applied    Home Living Family/patient expects to be discharged to:: Private residence Living Arrangements: Alone Available Help at Discharge: Family;Available PRN/intermittently Type of Home: Other(Comment) Home Access: Stairs to enter Entrance Stairs-Rails: None Home Layout: One level Home Equipment: Walker - 2 wheels;Tub bench Additional Comments: daughter lives 30 minutes away. will have 18 yo granddaughter stay with her at times during recovery other wise alone.  pt very much wants to know more about SNF level but reports "my doctor wants me to go home instead"    Prior Function Level of Independence: Independent          PT Goals (current goals can now be found in the care plan section) Acute Rehab PT Goals Patient Stated Goal: Rehab to regain IND and return home PT Goal Formulation: With patient Time For Goal Achievement: 09/18/17 Potential to Achieve Goals: Good Progress towards PT goals: Progressing toward goals     Frequency    7X/week      PT Plan Discharge plan needs to be updated    Co-evaluation              AM-PAC PT "6 Clicks" Daily Activity  Outcome Measure  Difficulty turning over in bed (including adjusting bedclothes, sheets and blankets)?: A Little Difficulty moving from lying on back to sitting on the side of the bed? : A Little Difficulty sitting down on and standing up from a chair with arms (e.g., wheelchair, bedside commode, etc,.)?: A Little Help needed moving to and from a bed to chair (including a wheelchair)?: A Little Help needed walking in hospital room?: A Little Help needed climbing 3-5 steps with a railing? : A Little 6 Click Score: 18    End of Session Equipment Utilized During Treatment: Gait belt;Right knee immobilizer Activity Tolerance: Patient tolerated treatment well Patient left: in chair;with call bell/phone within reach   PT Visit Diagnosis: Muscle weakness (generalized) (M62.81);Difficulty in walking, not elsewhere classified (R26.2);Pain Pain - Right/Left: Right Pain - part of body: Knee     Time: 8756-4332 PT Time Calculation (min) (ACUTE ONLY): 24 min  Charges:  $Gait Training: 23-37 mins $Therapeutic Exercise: 23-37 mins                    G Codes:       Pg [phone]    Valory Wetherby 09/05/2017, 12:30 PM

## 2017-09-05 NOTE — Care Management Note (Signed)
Case Management Note  Patient Details  Name: Alexis Hines MRN: [id number] Date of Birth: 10-03-1933  Subjective/Objective:  Right TKA                  Action/Plan: Referral sent to CSW for SNF placement. PT/OT recommended SNF.   Expected Discharge Date:  09/03/17               Expected Discharge Plan:  [place]  In-House Referral:  Clinical Social Work  Discharge planning Services  CM Consult  Post Acute Care Choice:  NA Choice offered to:  NA  DME Arranged:  N/A DME Agency:  NA  HH Arranged:  NA HH Agency:  NA  Status of Service:  Completed, signed off  If discussed at [employer] of Stay Meetings, dates discussed:    Additional Comments:  Erenest Rasher, RN 09/05/2017, 4:45 PM

## 2017-09-06 ENCOUNTER — Encounter (HOSPITAL_COMMUNITY): Payer: Self-pay | Admitting: Specialist

## 2017-09-06 LAB — CBC
HCT: 39.1 % (ref 36.0–46.0)
HEMOGLOBIN: 12.7 g/dL (ref 12.0–15.0)
MCH: 30.5 pg (ref 26.0–34.0)
MCHC: 32.5 g/dL (ref 30.0–36.0)
MCV: 93.8 fL (ref 78.0–100.0)
PLATELETS: 239 10*3/uL (ref 150–400)
RBC: 4.17 MIL/uL (ref 3.87–5.11)
RDW: 13 % (ref 11.5–15.5)
WBC: 9.3 10*3/uL (ref 4.0–10.5)

## 2017-09-06 MED ORDER — HYDROMORPHONE HCL 2 MG PO TABS
2.0000 mg | ORAL_TABLET | ORAL | Status: DC | PRN
  Administered 2017-09-06 (×3): 2 mg via ORAL
  Filled 2017-09-06 (×3): qty 1

## 2017-09-06 MED ORDER — HYDROMORPHONE HCL 2 MG PO TABS: 2.0000 mg | ORAL_TABLET | ORAL | 0 refills | Status: DC | PRN

## 2017-09-06 MED ORDER — MAGNESIUM CITRATE PO SOLN
1.0000 | Freq: Every day | ORAL | Status: AC
  Administered 2017-09-06: 1 via ORAL
  Filled 2017-09-06: qty 296

## 2017-09-06 NOTE — Care Management Important Message (Signed)
Important Message  Patient Details  Name: Alexis Hines MRN: [id number] Date of Birth: [birth year]   Medicare Important Message Given:  Yes    Kerin Salen 09/06/2017, 2:50 [place] Message  Patient Details  Name: Alexis Hines MRN: [id number] Date of Birth: 1933/06/24   Medicare Important Message Given:  Yes    Kerin Salen 09/06/2017, 2:50 PM

## 2017-09-06 NOTE — Progress Notes (Signed)
Report given to Suanne Marker at [facility] [employer]

## 2017-09-06 NOTE — Progress Notes (Signed)
Physical Therapy Treatment Patient Details Name: Alexis Hines MRN: [id number] DOB: Aug 21, 1933 Today's Date: 09/06/2017    History of Present Illness 84yo female who failed conservative treatment for knee pain, received R TKR on 09/03/17. PMH hx skin CA, hx L TKR     PT Comments    POD # 3 am session Pt progressing slowly.  General transfer comment: required 50% VC's on proper hand placement as pt kept trying to pull on walker to rise then falling backward back onto bed.  Also assisted in bathroom with toilet transfers.  Again, 50% VC's on safety with turn completion and walker placement.  Pt required assist to maintain standing balance during don/doff underpants.  Pt unable to safely perform demonstarting LOB posterior.  Pt required assist to safely navigate walker around obsticles and through doorway.  When attempting to sit in recliner, pt required 25% VC's to reach back prior and advance R LE to avoid  excessive knee flex.  General Gait Details: Very unsteady gait with impaired safety cognition and poor balance correction.  Required assist to proper maneuver walker around bed through bathroom door.  Pt tipping walker and moving walker same time she is stepping.  Poor self correction to midline and poor reaction responce.,   HIGH FALL RISK.  Pt lives home alone and will need ST Rehab prior to safely returning.   Follow Up Recommendations  SNF     Equipment Recommendations       Recommendations for Other Services       Precautions / Restrictions Precautions Precautions: Knee;Fall Precaution Comments: did not use KI this session Restrictions Weight Bearing Restrictions: No RLE Weight Bearing: Weight bearing as tolerated    Mobility  Bed Mobility Overal bed mobility: Needs Assistance Bed Mobility: Supine to Sit     Supine to sit: Min assist;HOB elevated     General bed mobility comments: required assist R LE, increased time, use of bed rails and pad to complete  scooting  Transfers Overall transfer level: Needs assistance Equipment used: Rolling walker (2 wheeled) Transfers: Sit to/from [employer] Sit to Stand: Min assist Stand pivot transfers: Min assist       General transfer comment: required 50% VC's on proper hand placement as pt kept trying to pull on walker to rise then falling backward back onto bed.  Also assisted in bathroom with toilet transfers.  Again, 50% VC's on safety with turn completion and walker placement.  Pt required assist to maintain standing balance during don/doff underpants.  Pt unable to safely perform demonstarting LOB posterior.  Pt required assist to safely navigate walker around obsticles and through doorway.  When attempting to sit in recliner, pt required 25% VC's to reach back prior and advance R LE to avoid  excessive knee flex.    Ambulation/Gait Ambulation/Gait assistance: Min assist Ambulation Distance (Feet): 25 Feet Assistive device: Rolling walker (2 wheeled) Gait Pattern/deviations: Step-to pattern;Decreased step length - left;Decreased stance time - right;Decreased dorsiflexion - right;Decreased weight shift to right;Shuffle Gait velocity: decreased   General Gait Details: Very unsteady gait with impaired safety cognition and poor balance correction.  Required assist to proper maneuver walker around bed through bathroom door.  Pt tipping walker and moving walker same time she is stepping.  Poor self correction to midline and poor reaction responce.,   HIGH FALL RISK.    Stairs             Wheelchair Mobility    Modified Rankin (Stroke Patients  Only)       Balance                                            Cognition Arousal/Alertness: Awake/alert Behavior During Therapy: WFL for tasks assessed/performed Overall Cognitive Status: Within Functional Limits for tasks assessed                                 General Comments: required repeat  VC's for safety in bathroom      Exercises   Total Knee Replacement TE's 10 reps B LE ankle pumps 10 reps towel squeezes 10 reps knee presses 10 reps heel slides  AAROM 10 - 40 degrees 10 reps SLR's AAROM 10 reps ABD AAROM Followed by ICE     General Comments        Pertinent Vitals/Pain Pain Assessment: 0-10 Pain Score: 7  Pain Location: R knee Pain Descriptors / Indicators: Aching;Sore;Operative site guarding Pain Intervention(s): Monitored during session;Repositioned;Premedicated before session;Ice applied    Home Living                      Prior Function            PT Goals (current goals can now be found in the care plan section) Progress towards PT goals: Progressing toward goals    Frequency    7X/week      PT Plan Current plan remains appropriate    Co-evaluation              AM-PAC PT "6 Clicks" Daily Activity  Outcome Measure  Difficulty turning over in bed (including adjusting bedclothes, sheets and blankets)?: A Little Difficulty moving from lying on back to sitting on the side of the bed? : A Little Difficulty sitting down on and standing up from a chair with arms (e.g., wheelchair, bedside commode, etc,.)?: A Lot Help needed moving to and from a bed to chair (including a wheelchair)?: A Lot Help needed walking in hospital room?: A Lot Help needed climbing 3-5 steps with a railing? : Total 6 Click Score: 13    End of Session Equipment Utilized During Treatment: Gait belt Activity Tolerance: Patient limited by fatigue;Patient limited by pain Patient left: in chair;with chair alarm set;with call bell/phone within reach Nurse Communication: Mobility status PT Visit Diagnosis: Muscle weakness (generalized) (M62.81);Difficulty in walking, not elsewhere classified (R26.2);Pain Pain - Right/Left: Right Pain - part of body: Knee     Time: 8295-6213 PT Time Calculation (min) (ACUTE ONLY): 32 min  Charges:  $Gait Training: 8-22  mins $Therapeutic Exercise: 8-22 mins                    G Codes:       Rica Koyanagi  PTA [facility]  Acute  Rehab Pager      [phone]

## 2017-09-06 NOTE — Clinical Social Work Note (Signed)
Clinical Social Work Assessment  Patient Details  Name: Alexis Hines MRN: [id number] Date of Birth: 05-30-1933  Date of referral:  09/06/17               Reason for consult:  Facility Placement                Permission sought to share information with:  Case Manager, Customer service manager, Family Supports Permission granted to share information::  Yes, Verbal Permission Granted  Name::     Stage manager::  SNF  Relationship::  Daughter  Contact Information:     Housing/Transportation Living arrangements for the past 2 months:  Single Family Home Source of Information:  Patient Patient Interpreter Needed:  None Criminal Activity/Legal Involvement Pertinent to Current Situation/Hospitalization:  No - Comment as needed Significant Relationships:  Adult Children Lives with:  Self Do you feel safe going back to the place where you live?  Yes Need for family participation in patient care:  Yes (Comment)  Care giving concerns:  No care giving concerns at the time of assessment.    Social Worker assessment / plan:  LCSW following for SNF placement.   Patient admitted from home for right knee surgery.   Patient reports that she lives alone. Patient states that prior to surgery she was independent in all ADLs and driving.   Patient and family are agreeable to SNF at dc. Patient prefers [facility]. Patient reports her daughter will transport her to SNF.   PLAN: SNF at dc.    Employment status:  Retired Nurse, adult PT Recommendations:  [place] / Referral to community resources:     Patient/Family's Response to care: Patient is appreciative of LCSW coordination with dc planning.   Patient/Family's Understanding of and Emotional Response to Diagnosis, Current Treatment, and Prognosis: Patient and family are realistic about patients current condition. Patient states that she needs a little help before going home  since she lives alone.   Emotional Assessment Appearance:  Appears stated age Attitude/Demeanor/Rapport:    Affect (typically observed):  Accepting, Calm Orientation:  Oriented to Self, Oriented to Place, Oriented to  Time, Oriented to Situation Alcohol / Substance use:  Not Applicable Psych involvement (Current and /or in the community):  No (Comment)  Discharge Needs  Concerns to be addressed:  No discharge needs identified Readmission within the last 30 days:  No Current discharge risk:  None Barriers to Discharge:  No SNF bed   Servando Snare, LCSW 09/06/2017, 11:47 AM

## 2017-09-06 NOTE — Clinical Social Work Placement (Addendum)
    2:21 PM   Patient chose bed at [facility]. Room 3225  LCSW faxed dc docs to facility.   LCSW notified family.  Patient will transport by private car.   RN report #: [id number]  [facility]  CLINICAL SOCIAL WORK PLACEMENT  NOTE  Date:  09/06/2017  Patient Details  Name: Alexis Hines MRN: [id number] Date of Birth: [birth year]  Clinical Social Work is seeking post-discharge placement for this patient at the [place] level of care (*CSW will initial, date and re-position this form in  chart as items are completed):  Yes   Patient/family provided with [place] Work Department's list of facilities offering this level of care within the geographic area requested by the patient (or if unable, by the patient's family).  Yes   Patient/family informed of their freedom to choose among providers that offer the needed level of care, that participate in Medicare, Medicaid or managed care program needed by the patient, have an available bed and are willing to accept the patient.  Yes   Patient/family informed of [facility]'s ownership interest in [facility] and [facility], as well as of the fact that they are under no obligation to receive care at these facilities.  PASRR submitted to EDS on       PASRR number received on 09/06/17     Existing PASRR number confirmed on       FL2 transmitted to all facilities in geographic area requested by pt/family on 09/06/17     FL2 transmitted to all facilities within larger geographic area on       Patient informed that his/her managed care company has contracts with or will negotiate with certain facilities, including the following:        Yes   Patient/family informed of bed offers received.  Patient chooses bed at [facility]     Physician recommends and patient chooses bed at      Patient to be transferred to [facility] on 09/06/17.  Patient to be  transferred to facility by Family     Patient family notified on 09/06/17 of transfer.  Name of family member notified:  Beverlee Nims     PHYSICIAN Please prepare priority discharge summary, including medications     Additional Comment:    _______________________________________________ Servando Snare, LCSW 09/06/2017, 2:21 PM

## 2017-09-06 NOTE — Progress Notes (Signed)
Patient reports pain unrelieved with Oxy 10 mg or 15 mg.  Calls out for pain medicine within 2 hours of receiving previous dose.

## 2017-09-06 NOTE — Progress Notes (Signed)
Subjective: 3 Days Post-Op Procedure(s) (LRB): RIGHT TOTAL KNEE ARTHROPLASTY (Right) Patient reports pain as moderate to right knee.Reports pain is not controlled. Reports no Bm. Tolerating Po's. Limited PT. Denies CP or SOB.    Objective: Vital signs in last 24 hours: Temp:  [97.5 F (36.4 C)-98.3 F (36.8 C)] 98.3 F (36.8 C) (06/10 0503) Pulse Rate:  [64-73] 71 (06/10 0503) Resp:  [13-18] 14 (06/10 0503) BP: (121-150)/(63-71) 121/71 (06/10 0503) SpO2:  [92 %-97 %] 92 % (06/10 0503)  Intake/Output from previous day: 06/09 0701 - 06/10 0700 In: 480 [P.O.:480] Out: 1450 [Urine:1450] Intake/Output this shift: No intake/output data recorded.  Recent Labs    09/04/17 0551 09/05/17 0506 09/06/17 0458  HGB 13.6 12.4 12.7   Recent Labs    09/05/17 0506 09/06/17 0458  WBC 12.9* 9.3  RBC 4.10 4.17  HCT 37.8 39.1  PLT 266 239   Recent Labs    09/04/17 0551  NA 140  K 4.6  CL 104  CO2 27  BUN 16  CREATININE 0.82  GLUCOSE 159*  CALCIUM 8.4*   No results for input(s): LABPT, INR in the last 72 hours.  Well nourished. Alert and oriented x3. RRR, Lungs clear, BS x4. Abdomen soft and non tender. Right Calf soft and non tender. Right knee dressing C/D/I. No DVT signs. Compartment soft. No signs of infection.  Right LE grossly neurovascular intact.  Anticipated LOS equal to or greater than 2 midnights due to - Age 82 and older with one or more of the following:  - Obesity  - Expected need for hospital services (PT, OT, Nursing) required for safe  discharge  - Anticipated need for postoperative skilled nursing care or inpatient rehab  - Active co-morbidities: None OR   - Unanticipated findings during/Post Surgery: None  - Patient is a high risk of re-admission due to: None   Assessment/Plan: 3 Days Post-Op Procedure(s) (LRB): RIGHT TOTAL KNEE ARTHROPLASTY (Right) Up with therapy Discharge to SNF when ready Consult for SNF Change from oxycodone to Dilaudid  PO ASA at D/c F/u in 2 weeks with Dr.Collins Follow instructions  Constipation: OOB Colace Mag citrate this am       Irineo Gaulin L 09/06/2017, 7:38 AM

## 2017-09-06 NOTE — NC FL2 (Addendum)
[place] LEVEL OF CARE SCREENING TOOL     IDENTIFICATION  Patient Name: Alexis Hines Birthdate: 09/28/1933 Sex: female Admission Date (Current Location): 09/03/2017  [facility] and [place] Number:  Herbalist and Address:  [facility],  [place] [address] [place]      Provider Number: [id number]  Attending Physician Name and Address:  Sydnee Cabal, MD  Relative Name and Phone Number:       Current Level of Care: Hospital Recommended Level of Care: [place] Prior Approval Number:     Date Approved/Denied:   PASRR Number:   [id number]    Discharge Plan: SNF    Current Diagnoses: Patient Active Problem List   Diagnosis Date Noted  . Primary osteoarthritis of right knee 09/03/2017  . S/P knee replacement 09/03/2017  . Generalized anxiety disorder 02/04/2017  . Precordial pain 10/26/2013  . Hip arthritis 12/29/2012  . Allergic rhinitis, cause unspecified 08/26/2011  . Heme + stool 12/23/2010  . Pure hypercholesterolemia 11/26/2010    Orientation RESPIRATION BLADDER Height & Weight     Self, Time, Situation, Place  Normal Continent Weight: 135 lb 9.6 oz (61.5 kg) Height:  4\' 11"  (149.9 cm)  BEHAVIORAL SYMPTOMS/MOOD NEUROLOGICAL BOWEL NUTRITION STATUS      Continent Diet(See dc sumamry)  AMBULATORY STATUS COMMUNICATION OF NEEDS Skin   Extensive Assist Verbally Surgical wounds(Right Knee)                       Personal Care Assistance Level of Assistance  Bathing, Dressing, Feeding Bathing Assistance: Limited assistance Feeding assistance: Independent Dressing Assistance: Limited assistance     Functional Limitations Info  Sight, Hearing, Speech Sight Info: Adequate Hearing Info: Adequate Speech Info: Adequate    SPECIAL CARE FACTORS FREQUENCY  PT (By licensed PT)     PT Frequency: 5x/week              Contractures Contractures Info: Not present    Additional Factors Info   Code Status, Allergies Code Status Info: Full Allergies Info: NKA           Current Medications (09/06/2017):  This is the current hospital active medication list Current Facility-Administered Medications  Medication Dose Route Frequency Provider Last Rate Last Dose  . 0.9 %  sodium chloride infusion   Intravenous Continuous Stilwell, Bryson L, PA-C   Stopped at 09/04/17 1349  . acetaminophen (TYLENOL) tablet 325-650 mg  325-650 mg Oral Q6H PRN Stilwell, Bryson L, PA-C   650 mg at 09/05/17 2140  . ALPRAZolam Duanne Moron) tablet 0.125-0.25 mg  0.125-0.25 mg Oral BID PRN Shuford, Tracy, PA-C   0.25 mg at 09/05/17 2337  . alum & mag hydroxide-simeth (MAALOX/MYLANTA) 200-200-20 MG/5ML suspension 30 mL  30 mL Oral Q4H PRN Stilwell, Bryson L, PA-C      . atorvastatin (LIPITOR) tablet 40 mg  40 mg Oral QHS Stilwell, Bryson L, PA-C   40 mg at 09/05/17 2140  . bisacodyl (DULCOLAX) EC tablet 5 mg  5 mg Oral Daily PRN Stilwell, Bryson L, PA-C      . chlorhexidine (HIBICLENS) 4 % liquid 4 application  60 mL Topical Once Stilwell, Bryson L, PA-C      . diphenhydrAMINE (BENADRYL) 12.5 MG/5ML elixir 12.5-25 mg  12.5-25 mg Oral Q4H PRN Stilwell, Bryson L, PA-C      . docusate sodium (COLACE) capsule 100 mg  100 mg Oral BID Stilwell, Bryson L, PA-C   100  mg at 09/06/17 0816  . enoxaparin (LOVENOX) injection 30 mg  30 mg Subcutaneous Q12H Stilwell, Bryson L, PA-C   30 mg at 09/06/17 0815  . fentaNYL (SUBLIMAZE) injection 50-100 mcg  50-100 mcg Intravenous UD Stilwell, Bryson L, PA-C   50 mcg at 09/04/17 1200  . ferrous sulfate tablet 325 mg  325 mg Oral TID PC Stilwell, Bryson L, PA-C   325 mg at 09/06/17 0816  . HYDROmorphone (DILAUDID) tablet 2-4 mg  2-4 mg Oral Q4H PRN Stilwell, Bryson L, PA-C   2 mg at 09/06/17 0816  . lactated ringers infusion   Intravenous Continuous Stilwell, Bryson L, PA-C 10 mL/hr at 09/03/17 1350    . menthol-cetylpyridinium (CEPACOL) lozenge 3 mg  1 lozenge Oral PRN Stilwell, Bryson L,  PA-C   3 mg at 09/05/17 1003   Or  . phenol (CHLORASEPTIC) mouth spray 1 spray  1 spray Mouth/Throat PRN Stilwell, Bryson L, PA-C      . methocarbamol (ROBAXIN) tablet 500 mg  500 mg Oral Q6H PRN Stilwell, Bryson L, PA-C   500 mg at 09/06/17 0341   Or  . methocarbamol (ROBAXIN) 500 mg in dextrose 5 % 50 mL IVPB  500 mg Intravenous Q6H PRN Stilwell, Bryson L, PA-C   Stopped at 09/04/17 0033  . metoCLOPramide (REGLAN) tablet 5-10 mg  5-10 mg Oral Q8H PRN Stilwell, Bryson L, PA-C       Or  . metoCLOPramide (REGLAN) injection 5-10 mg  5-10 mg Intravenous Q8H PRN Stilwell, Bryson L, PA-C      . midazolam (VERSED) injection 1-2 mg  1-2 mg Intravenous UD Stilwell, Bryson L, PA-C   2 mg at 09/03/17 1509  . ondansetron (ZOFRAN) tablet 4 mg  4 mg Oral Q6H PRN Stilwell, Bryson L, PA-C       Or  . ondansetron (ZOFRAN) injection 4 mg  4 mg Intravenous Q6H PRN Stilwell, Bryson L, PA-C      . polyethylene glycol (MIRALAX / GLYCOLAX) packet 17 g  17 g Oral Daily PRN Stilwell, Sueanne Margarita, PA-C         Discharge Medications: Please see discharge summary for a list of discharge medications.  Relevant Imaging Results:  Relevant Lab Results:   Additional Information ssn: [id number]  Servando Snare, LCSW

## 2017-09-26 NOTE — Progress Notes (Signed)
Chief Complaint  Patient presents with  . Follow-up    total knee replacement,discharged from rehab 09/21/17    She had R total knee replacement on 09/03/17. She was discharged to SNF on 6/10. She was sent home from [facility] on 6/[facility]th. Yesterday someone came and they will be starting home PT. She reports doing well overall.  She has a walker, hasn't had falls. Is happy to be home (where the food is better!) She is continuing to have pain, which is worse at night.  She sees Dr. Theda Sers again next Monday.  She has been using dilaudid and oxycodone for pain.  Takes oxycodone every 5-6 hours (4-5x/day), and dilaudid once or twice daily, between doses of oxycodone. Taking stool softener, constipation has been manageable. Not sleeping well due to pain, so is a little tired. Denies sedation from meds, no falls.  No change in other medications, just the addition of pain meds, and  ASA 325 BID. She denies bleeding, bruising, or GI complaints. She is taking baclofen TID as directed, but doesn't really find it helpful   Complaining of itchy right ear Having slight swelling to her right foot.  Denies calf pain.  PMH, PSH, SH reviewed  Outpatient Encounter Medications as of 09/27/2017  Medication Sig  . aspirin EC 325 MG tablet Take 1 tablet (325 mg total) by mouth 2 (two) times daily.  Marland Kitchen atorvastatin (LIPITOR) 40 MG tablet Take 40 mg by mouth at bedtime.   . baclofen (LIORESAL) 10 MG tablet Take 1 tablet (10 mg total) by mouth 3 (three) times daily as needed for muscle spasms.  . Calcium Carbonate-Vitamin D (CALTRATE 600+D) 600-400 MG-UNIT per tablet Take 1 tablet by mouth daily at 3 pm.   . HYDROmorphone (DILAUDID) 2 MG tablet Take 1 tablet (2 mg total) by mouth every 4 (four) hours as needed for severe pain.  . Multiple Vitamin (MULTIVITAMIN WITH MINERALS) TABS tablet Take 1 tablet by mouth daily. CENTRUM MULTIVITAMIN  . oxyCODONE (OXY IR/ROXICODONE) 5 MG immediate release tablet TK 1  T PO 6 TIMES A DAY  . [DISCONTINUED] ALPRAZolam (XANAX) 0.25 MG tablet Take 0.125-0.25 mg by mouth daily as needed for anxiety.   No facility-administered encounter medications on file as of 09/27/2017.    No Known Allergies  ROS: see HPI. No fever, chills, URI symptoms, chest pain, shortness of breath, GI or GU complaints. Itchy ear, right foot/leg swelling per HPI.   Moods are good, appetite is normal.  Some fatigue.  PHYSICAL EXAM:  BP 132/60   Pulse 87   Temp 98.2 F (36.8 C) (Oral)   Ht 4\' 11"  (1.499 m)   Wt 136 lb 6.4 oz (61.9 kg)   SpO2 98%   BMI 27.55 kg/m   Well appearing female, in good spirits, appears comfortable, in no distress HEENT: conjunctiva and sclera clear.  EOMI.  R EAC--normal.  Some dry cerumen, partially obscuring TM.  Left TM and EAC is normal, no wax. OP clear Neck: no lymphadenopathy or mass Heart: regular rate and rhythm Lungs: clear Extremities: she is wearing a brace on the right knee, extends partway down calf.  She has trace to 1+ pitting edema in the calf, minimal at the ankle/foot. No erythema, warmth, rashes/lesions. No calf tenderness, cords, negative Homan sign. 2+ pulses Skin: intact, normal turgor Psych: normal mood, affect, hygiene and grooming Neuro: alert and oriented, cranial nerves intact. Walking with rolling walker.  ASSESSMENT/PLAN:  Itching of ear - poss related to dry  cerumen; trial debrox, vs use of mineral oil/other oil to help with itching. Reassured. Avoid q-tips  Status post right knee replacement - doing well overall; rec she taper down from routine use of narcotics during day, since pain worse at night. Risks reviewed. f/u w/ ortho as planned  Right leg swelling - normal s/p TKA.  no evidence of DVT or other pathology.   Records brought in by pt were reviewed (hosptial d/c summary, which I had already reviewed, and discharge info from [facility] which was not helpful--discharge date on it wasn't even correct).  F/u  with ortho as scheduled. To start home therapy tomorrow.  Risks of narcotics reviewed--seems like she is using quite often during the day, when her pain isn't as bad.  All questions answered.  (no phone call documented, so not transition of care visit)

## 2017-09-27 ENCOUNTER — Encounter: Payer: Self-pay | Admitting: Family Medicine

## 2017-09-27 ENCOUNTER — Ambulatory Visit (INDEPENDENT_AMBULATORY_CARE_PROVIDER_SITE_OTHER): Payer: Medicare Other | Admitting: Family Medicine

## 2017-09-27 VITALS — BP 132/60 | HR 87 | Temp 98.2°F | Ht 59.0 in | Wt 136.4 lb

## 2017-09-27 DIAGNOSIS — Z96651 Presence of right artificial knee joint: Secondary | ICD-10-CM | POA: Diagnosis not present

## 2017-09-27 DIAGNOSIS — M7989 Other specified soft tissue disorders: Secondary | ICD-10-CM | POA: Diagnosis not present

## 2017-09-27 DIAGNOSIS — L299 Pruritus, unspecified: Secondary | ICD-10-CM

## 2017-09-27 NOTE — Patient Instructions (Addendum)
You have some dried wax in the right year. You can try the debrox drops. If not helpful for the itching, you can try a small amount of mineral oil or other oil to see if that helps with itching. The ear otherwise looks normal, without any infection.  Consider starting to cut back on daytime use of pain medication, if your pain isn't too bad. Definitely still take it at bedtime, since pain seems to be worse at night.  Follow up with your orthopedist as planned.

## 2017-10-04 ENCOUNTER — Ambulatory Visit (INDEPENDENT_AMBULATORY_CARE_PROVIDER_SITE_OTHER): Payer: Medicare Other | Admitting: Family Medicine

## 2017-10-04 ENCOUNTER — Encounter: Payer: Self-pay | Admitting: Family Medicine

## 2017-10-04 VITALS — BP 150/80 | HR 80 | Temp 98.3°F | Ht 59.0 in | Wt 131.8 lb

## 2017-10-04 DIAGNOSIS — R109 Unspecified abdominal pain: Secondary | ICD-10-CM | POA: Diagnosis not present

## 2017-10-04 DIAGNOSIS — K5903 Drug induced constipation: Secondary | ICD-10-CM | POA: Diagnosis not present

## 2017-10-04 LAB — POCT URINALYSIS DIP (PROADVANTAGE DEVICE)
Bilirubin, UA: NEGATIVE
Glucose, UA: NEGATIVE mg/dL
Ketones, POC UA: NEGATIVE mg/dL
Leukocytes, UA: NEGATIVE
Nitrite, UA: NEGATIVE
PROTEIN UA: NEGATIVE mg/dL
RBC UA: NEGATIVE
SPECIFIC GRAVITY, URINE: 1.02
UUROB: NEGATIVE
pH, UA: 6 (ref 5.0–8.0)

## 2017-10-04 NOTE — Progress Notes (Signed)
Chief Complaint  Patient presents with  . Abdominal Pain    lower left sided abdominal pain.    Patient presents accompanied by her daughter, with complaint of LLQ pain  She reports having some intermittent LLQ pain for a few days, but this morning's pain was sharper.  She has been passing "hard marbles" the last few days. She passed some twice today (upon awakening, and after breakfast).  She has been taking 1-2 Colace daily. She has been straining some. Eating cereal with fiber and salad daily. Eats 3 prunes/day. Admits to not drinking enough water.  Drinks Crystal Light peach tea (decaf), 18 ounces/d, and not always any additional water.  Has Miralax at home, hasn't started using it yet.  She has been using daily narcotics since her knee replacement. She cut back from 6 to 4 oxycodone daily, 1 dilaudid (usually 4-5 pills total between the 2 medications).  Pain is 6/10 currently.  Last pain pill was 3.5 hours ago, and has been active (2 doctor appointments this morning).  At today's visit with the orthopedist, the oxycodone was changed to hydrocodone.  PMH, PSH, SH reviewed  Outpatient Encounter Medications as of 10/04/2017  Medication Sig Note  . atorvastatin (LIPITOR) 40 MG tablet Take 40 mg by mouth at bedtime.    . Calcium Carbonate-Vitamin D (CALTRATE 600+D) 600-400 MG-UNIT per tablet Take 1 tablet by mouth daily at 3 pm.    . HYDROcodone-acetaminophen (NORCO/VICODIN) 5-325 MG tablet Take 1 tablet by mouth every 6 (six) hours as needed for moderate pain. 10/04/2017: Given this today to fill.  . Multiple Vitamin (MULTIVITAMIN WITH MINERALS) TABS tablet Take 1 tablet by mouth daily. CENTRUM MULTIVITAMIN   . aspirin EC 325 MG tablet Take 1 tablet (325 mg total) by mouth 2 (two) times daily. (Patient not taking: Reported on 10/04/2017) 10/04/2017: Dr.Collins stopped at visit this am.   . baclofen (LIORESAL) 10 MG tablet Take 1 tablet (10 mg total) by mouth 3 (three) times daily as needed for  muscle spasms. (Patient not taking: Reported on 10/04/2017)   . [DISCONTINUED] HYDROmorphone (DILAUDID) 2 MG tablet Take 1 tablet (2 mg total) by mouth every 4 (four) hours as needed for severe pain.   . [DISCONTINUED] oxyCODONE (OXY IR/ROXICODONE) 5 MG immediate release tablet TK 1 T PO 6 TIMES A DAY    No facility-administered encounter medications on file as of 10/04/2017.    Has been using dilaudid and oxycodone up until today, as stated in HPI  No Known Allergies  ROS: no fever, chills, urinary complaints, hematochezia, melena.  LLQ pain per HPI.  No headaches, dizziness, shortness of breath, URI symptoms or other complaints.  +right knee pain/recent surgery.  PHYSICAL EXAM:  BP (!) 150/80   Pulse 80   Temp 98.3 F (36.8 C) (Tympanic)   Ht 4\' 11"  (1.499 m)   Wt 131 lb 12.8 oz (59.8 kg)   BMI 26.62 kg/m   Well appearing ,pleasant female, accompanied by her daughter. She doesn't appear to be in distress (reports 6/10 knee pain). HEENT: conjunctiva and sclera area clear, EOMI Neck: no lymphadenopathy or mass Heart: regular rate and rhythm Lungs: clear bilaterally Abdomen: soft, nondistended. Normal bowel sounds.  Mildly tender in LLQ, no rebound tenderness or guarding, no mass Extremities: no edema Skin; normal turgor  Urine dip: SG 1.020, otherwise normal  ASSESSMENT/PLAN:  Drug-induced constipation  Abdominal pain, unspecified abdominal location - Plan: POCT Urinalysis DIP (Proadvantage Device)  Discussed pain meds, fiber, fluids, activity, Miralax,  colace, and enema or stimulant laxatives to be used if quicker action needed--but to be aware of the risk for cramping and diarrhea. Her limited mobility may put her at increased risk for accident.    You need to drink more fluids daily (48-64 ounces at a minimum). I recommend taking a fiber supplement (Metamucil, Citricel, etc) daily--use the powder in a full glass of liquid. Start using Miralax once daily.  Continue this  until you find that your stools are too frequent or too loose, then you can back down on the dose and/or frequency (ie 1/2 cap daily vs using full dose every other day).  Once you cut back further on the pain medications, you likely can cut back on the Miralax.  The narcotic medications are causing the constipation, but this is exacerbated by not getting enough fiber or enough water. Continue your prunes daily.

## 2017-10-04 NOTE — Patient Instructions (Signed)
  You need to drink more fluids daily (48-64 ounces at a minimum). I recommend taking a fiber supplement (Metamucil, Citricel, etc) daily--use the powder in a full glass of liquid. Start using Miralax once daily.  Continue this until you find that your stools are too frequent or too loose, then you can back down on the dose and/or frequency (ie 1/2 cap daily vs using full dose every other day).  Once you cut back further on the pain medications, you likely can cut back on the Miralax.  The narcotic medications are causing the constipation, but this is exacerbated by not getting enough fiber or enough water. Continue your prunes daily.    Constipation, Adult Constipation is when a person has fewer bowel movements in a week than normal, has difficulty having a bowel movement, or has stools that are dry, hard, or larger than normal. Constipation may be caused by an underlying condition. It may become worse with age if a person takes certain medicines and does not take in enough fluids. Follow these instructions at home: Eating and drinking   Eat foods that have a lot of fiber, such as fresh fruits and vegetables, whole grains, and beans.  Limit foods that are high in fat, low in fiber, or overly processed, such as french fries, hamburgers, cookies, candies, and soda.  Drink enough fluid to keep your urine clear or pale yellow. General instructions  Exercise regularly or as told by your health care provider.  Go to the restroom when you have the urge to go. Do not hold it in.  Take over-the-counter and prescription medicines only as told by your health care provider. These include any fiber supplements.  Practice pelvic floor retraining exercises, such as deep breathing while relaxing the lower abdomen and pelvic floor relaxation during bowel movements.  Watch your condition for any changes.  Keep all follow-up visits as told by your health care provider. This is important. Contact a  health care provider if:  You have pain that gets worse.  You have a fever.  You do not have a bowel movement after 4 days.  You vomit.  You are not hungry.  You lose weight.  You are bleeding from the anus.  You have thin, pencil-like stools. Get help right away if:  You have a fever and your symptoms suddenly get worse.  You leak stool or have blood in your stool.  Your abdomen is bloated.  You have severe pain in your abdomen.  You feel dizzy or you faint. This information is not intended to replace advice given to you by your health care provider. Make sure you discuss any questions you have with your health care provider. Document Released: 12/13/2003 Document Revised: 10/04/2015 Document Reviewed: 09/04/2015 Elsevier Interactive Patient Education  2018 [employer].

## 2017-10-06 ENCOUNTER — Telehealth: Payer: Self-pay | Admitting: *Deleted

## 2017-10-06 NOTE — Telephone Encounter (Signed)
Water in caffeinated beverages do NOT count--you are actually supposed to drink an extra glass of water for every caffeinated beverage that you drink. If her tea is decaff, it is probably okay.  The juice counts as liquids.  Not the coffee.

## 2017-10-06 NOTE — Telephone Encounter (Signed)
Pt called and states that she is really trying to increase her water intake as you instructed her to do and it really hard but she is trying. Wants to know if the water in her tea and coffee count towards that water intake and also she drinks a lot of Kadem grape juice daily, does this count as well?

## 2017-10-06 NOTE — Telephone Encounter (Signed)
Patient advised.

## 2017-10-11 ENCOUNTER — Telehealth: Payer: Self-pay | Admitting: Family Medicine

## 2017-10-11 NOTE — Telephone Encounter (Signed)
If stools too loose, make sure she isn't taking stool softener along with the miralax. Continue miralax. Ensure no fever or blood in stool. If fever, blood in stool, worsening pain, needs urgent evaluation.  If persistent abdominal pain, and diarrhea is resolving, would recommend acute abdominal series x-ray with OV later in the day (so we have results of x-rays at visit).

## 2017-10-11 NOTE — Telephone Encounter (Signed)
  Patient called about her abdominal pain Pain did get a little better for a few days She is taking Miralax but has cut down on the dose because her stools were too loose  Last night pain became worse, probably 7-8 on scale of 1-10 Did offer her appointment this afternoon but she has PT appt.   She did mention that her surgeon changed her pain medication about a week ago to hydrocodone w/ acetaminophen     Please call, if no answer at home please try cell #  [phone]

## 2017-10-11 NOTE — Telephone Encounter (Signed)
Patient states that she will call back if the pain persists.

## 2017-11-11 ENCOUNTER — Encounter: Payer: Self-pay | Admitting: Family Medicine

## 2017-11-11 ENCOUNTER — Ambulatory Visit (INDEPENDENT_AMBULATORY_CARE_PROVIDER_SITE_OTHER): Payer: Medicare Other | Admitting: Family Medicine

## 2017-11-11 VITALS — BP 128/70 | HR 84 | Temp 98.6°F | Ht 59.0 in | Wt 128.8 lb

## 2017-11-11 DIAGNOSIS — F331 Major depressive disorder, recurrent, moderate: Secondary | ICD-10-CM

## 2017-11-11 DIAGNOSIS — K5900 Constipation, unspecified: Secondary | ICD-10-CM | POA: Diagnosis not present

## 2017-11-11 DIAGNOSIS — R21 Rash and other nonspecific skin eruption: Secondary | ICD-10-CM

## 2017-11-11 MED ORDER — ESCITALOPRAM OXALATE 10 MG PO TABS: ORAL_TABLET | ORAL | 1 refills | Status: DC

## 2017-11-11 NOTE — Patient Instructions (Signed)
  Restart lexapro. Start at 1/2 tablet daily for a week. Increase to the full pill at a week--you can wait longer if you are still having side effects from starting it.   You previously tolerated this well, so hopefully you will notice improvement in your moods.  You may even notice improvement in your sleep.  After you have been on it for a few weeks, you can try cutting the temazepam in half, and possibly stopping it if you find you no longer need it for sleep.   Use claritin or Allegra once daily (starting now) to help with allergic reaction and itching. If this doesn't help enough, you can also take benadryl tonight at bedtime, IN PLACE OF TEMAZEPAM--don't take both together since they both make you sleepy.  Use 1% hydrocortisone cream (ie Cortaid--over the counter) to the rash on the chin and upper eyelid twice daily until better (shouldn't need for longer than a few days to a week).   We will request records for your last colonoscopy report, which will tell us if you had diverticulosis or not.  Regardless of this result, continue the fiber supplement, and good water intake. You can switch to the Citrucel powder when you finish the large pills.

## 2017-11-11 NOTE — Progress Notes (Signed)
Chief Complaint  Patient presents with  . Consult    questioning whether or not she could possibly have diverticulosis or diverticulitis. In general she doesn't feel good, she thinks her knee healing process is taking too long and she is in constant pain. She is unhappy but doesn't want to seepa psychiatrist.   . Rash    has mussells for lunch yesterday at [employer] and a while after she began to have an itchy rash on her chin and above her eyes. She also worked in her garden bur has been doing that for days.    Unhappy because her knee isn't healing as quickly as she thought it would, s/p knee replacement.  Pain is constant, but tolerable (5/10). Can't kneel in church or keep her legs crossed for too long.  She was getting up every 2 hours with pain at night, changed to temazepam for sleep instead of taking dilaudid or other opioid.  She can now sleep 5 hours at a time. She reports "tylenol does nothing for me". Takes 2-3 advil/day, which does help some. She takes it with some food, even when taken in the middle of the night.  Denies GI side effects.  She isn't sure if unhappiness if just related to her knee pain or not. She is going to [place] for a few weeks, hoping that helps with her moods.  She has some GI/bowel concerns.  She is taking citrucel tablet (2 with 8 oz water), they are very large to swallow, she is managing, but doesn't like them (due to size). She is concerned about diverticulosis and diverticulitis and brought in printed information about this.  He does not recall being told she had diverticulosis in the past, but was concerned about this when she had LLQ discomfort.  Last colonoscopy documented in her chart was in 2007. She reports had +FOB, had f/u colonoscopy in 2012. She reports this was normal. We did not get results. She has never had blood in stool.  No longer has LLQ pain or constipation. Bowels are normal--sometimes are "marbly" (harder, small stools), usually soft; having BM's  daily.  She has decreased appetite, nothing tastes good to her, even her own food.  She feels like she has to force herself to eat, to not lose weight. Denies sinus problems, changes in smell.  She previously took lexapro for anxiety, stopped it when she sold her house, and was in [place]. She has been off this since January 2019.    Yesterday she had mussels at [employer] for lunch.  When she got in the car after lunch she noticed redness on her chin, and right eye was itchy.  She started feeling itchy all over. She hasn't taken anything or used any creams.   No problems with mussels or shellfish in the past. She has been working in her garden x 3 weeks. Denies tongue/throat swelling, trouble swallowing or shortness of breath.  PMH, PSH, SH reviewed  Outpatient Encounter Medications as of 11/11/2017  Medication Sig Note  . atorvastatin (LIPITOR) 40 MG tablet Take 40 mg by mouth at bedtime.    . Calcium Carbonate-Vitamin D (CALTRATE 600+D) 600-400 MG-UNIT per tablet Take 1 tablet by mouth daily at 3 pm.    . ibuprofen (ADVIL) 200 MG tablet Take 200 mg by mouth every 6 (six) hours as needed. 11/11/2017: Takes 2-3/d  . Methylcellulose, Laxative, (CITRUCEL PO) Take 2 tablets by mouth daily.   . Multiple Vitamin (MULTIVITAMIN WITH MINERALS) TABS tablet Take 1 tablet  by mouth daily. CENTRUM MULTIVITAMIN   . temazepam (RESTORIL) 15 MG capsule TK 1 C PO QD HS   . [DISCONTINUED] neomycin-polymyxin b-dexamethasone (MAXITROL) 3.5-10000-0.1 OINT Use on surgical incisions 4 times a day for 10 days   . HYDROcodone-acetaminophen (NORCO/VICODIN) 5-325 MG tablet Take 1 tablet by mouth every 6 (six) hours as needed for moderate pain.   . [DISCONTINUED] aspirin EC 325 MG tablet Take 1 tablet (325 mg total) by mouth 2 (two) times daily. (Patient not taking: Reported on 10/04/2017) 10/04/2017: Dr.Collins stopped at visit this am.   . [DISCONTINUED] baclofen (LIORESAL) 10 MG tablet Take 1 tablet (10 mg total) by mouth 3  (three) times daily as needed for muscle spasms. (Patient not taking: Reported on 10/04/2017)    No facility-administered encounter medications on file as of 11/11/2017.    No Known Allergies  ROS:  No fever, chills, no URI symptoms, allergies. Occasional nausea (rare). Constipation is much improved. No abdominal pain. Rash on face per HPI. Depression per HPI. Persistent R knee pain since surgery.  PHYSICAL EXAM:  BP 128/70   Pulse 84   Temp 98.6 F (37 C) (Tympanic)   Ht 4\' 11"  (1.499 m)   Wt 128 lb 12.8 oz (58.4 kg)   BMI 26.01 kg/m   Wt Readings from Last 3 Encounters:  11/11/17 128 lb 12.8 oz (58.4 kg)  10/04/17 131 lb 12.8 oz (59.8 kg)  09/27/17 136 lb 6.4 oz (61.9 kg)   Well appearing female, talkative, in no distress HEENT: conjunctiva and sclera are clear, EOMI. No drainage or crusting.   Splotchy diffuse patches of erythema that are slightly raised, along the chin (at the fold, midway between lips and bottom of chin), slight on hr left lower cheek. There is some redness medially at the eye and the upper eyelid, but no significant swelling. Neck: no lymphadenopathy, thyromegaly or mass Heart: regular rate and rhythm Lungs: clear bilaterally Abdomen: soft, nontender, normal bowel sounds, no organomegaly or mass Extremities: no edema, normal pulses. Skin on arms/legs appear normal without rashes. Neuro: alert and oriented, cranial nerves intact.    PHQ-9 score of 11   ASSESSMENT/PLAN:  Moderate episode of recurrent major depressive disorder (HCC) - restart Lexapro; risks/SE reviewed. Plan to take at least 6 mos. F/u in 6 wks - Plan: escitalopram (LEXAPRO) 10 MG tablet  Rash of face - unclear etiology, doubt foot allergy; suspect contact derm. OTC HC BID, and claritin to help with all over itching  Constipation, unspecified constipation type - improved since taking citrucel and stopping opioids. Cont citrucel (change to powder); get colonoscopy report--reassured no  e/o infection   Sign ROR, get results from colonoscopy 2012 (Dr. Cristina Gong)   [place]. Start at 1/2 tablet daily for a week. Increase to the full pill at a week--you can wait longer if you are still having side effects from starting it.   You previously tolerated this well, so hopefully you will notice improvement in your moods.  You may even notice improvement in your sleep.  After you have been on it for a few weeks, you can try cutting the temazepam in half, and possibly stopping it if you find you no longer need it for sleep.   Use claritin or Allegra once daily (starting now) to help with allergic reaction and itching. If this doesn't help enough, you can also take benadryl tonight at bedtime, IN PLACE OF TEMAZEPAM--don't take both together since they both make you sleepy.  Use 1% hydrocortisone  cream (ie Cortaid--over the counter) to the rash on the chin and upper eyelid twice daily until better (shouldn't need for longer than a few days to a week).   We will request records for your last colonoscopy report, which will tell us if you had diverticulosis or not.  Regardless of this result, continue the fiber supplement, and good water intake. You can switch to the Citrucel powder when you finish the large pills.

## 2017-11-14 ENCOUNTER — Other Ambulatory Visit: Payer: Self-pay | Admitting: Family Medicine

## 2017-11-14 MED ORDER — PREDNISONE 10 MG (21) PO TBPK: ORAL_TABLET | ORAL | 0 refills | Status: DC

## 2017-11-14 NOTE — Progress Notes (Signed)
Rash getting worse. I will call in prednisone and have her make an appt for tomorow

## 2017-11-15 ENCOUNTER — Ambulatory Visit (INDEPENDENT_AMBULATORY_CARE_PROVIDER_SITE_OTHER): Payer: Medicare Other | Admitting: Family Medicine

## 2017-11-15 ENCOUNTER — Encounter: Payer: Self-pay | Admitting: *Deleted

## 2017-11-15 ENCOUNTER — Encounter: Payer: Self-pay | Admitting: Family Medicine

## 2017-11-15 VITALS — BP 122/72 | HR 84 | Temp 99.0°F | Ht 59.0 in | Wt 126.4 lb

## 2017-11-15 DIAGNOSIS — L255 Unspecified contact dermatitis due to plants, except food: Secondary | ICD-10-CM

## 2017-11-15 NOTE — Patient Instructions (Addendum)
Complete the steroid course as planned. It is possible that this course may be too short. If you don't have complete resolution (but it is improving) by the time you finish your last pill, then call us to extend the course. If you get complete resolution, but the rash starts to return shortly after finishing the course, contact us with the name of a pharmacy in [place], and we will treat you a little longer with the steroids. If it doesn't get better at all, let us know that too.  I suspect this is poison ivy (vs other plant dermatitis).   You may use topical medication such as calamine or caladryl lotion to help with the itching. Cool compresses may help. Some people benefit from Aveeno bath for itching.   Poison Ivy Dermatitis Poison ivy dermatitis is inflammation of the skin that is caused by the allergens on the leaves of the poison ivy plant. The skin reaction often involves redness, swelling, blisters, and extreme itching. What are the causes? This condition is caused by a specific chemical (urushiol) found in the sap of the poison ivy plant. This chemical is sticky and can be easily spread to people, animals, and objects. You can get poison ivy dermatitis by:  Having direct contact with a poison ivy plant.  Touching animals, other people, or objects that have come in contact with poison ivy and have the chemical on them.  What increases the risk? This condition is more likely to develop in:  People who are outdoors often.  People who go outdoors without wearing protective clothing, such as closed shoes, long pants, and a long-sleeved shirt.  What are the signs or symptoms? Symptoms of this condition include:  Redness and itching.  A rash that often includes bumps and blisters. The rash usually appears 48 hours after exposure.  Swelling. This may occur if the reaction is more severe.  Symptoms usually last for 1-2 weeks. However, the first time you develop this condition,  symptoms may last 3-4 weeks. How is this diagnosed? This condition may be diagnosed based on your symptoms and a physical exam. Your health care provider may also ask you about any recent outdoor activity. How is this treated? Treatment for this condition will vary depending on how severe it is. Treatment may include:  Hydrocortisone creams or calamine lotions to relieve itching.  Oatmeal baths to soothe the skin.  Over-the-counter antihistamine tablets.  Oral steroid medicine for more severe outbreaks.  Follow these instructions at home:  Take or apply over-the-counter and prescription medicines only as told by your health care provider.  Wash exposed skin as soon as possible with soap and cold water.  Use hydrocortisone creams or calamine lotion as needed to soothe the skin and relieve itching.  Take oatmeal baths as needed. Use colloidal oatmeal. You can get this at your local pharmacy or grocery store. Follow the instructions on the packaging.  Do not scratch or rub your skin.  While you have the rash, wash clothes right after you wear them. How is this prevented?  Learn to identify the poison ivy plant and avoid contact with the plant. This plant can be recognized by the number of leaves. Generally, poison ivy has three leaves with flowering branches on a single stem. The leaves are typically glossy, and they have jagged edges that come to a point at the front.  If you have been exposed to poison ivy, thoroughly wash with soap and water right away. You have about 30  minutes to remove the plant resin before it will cause the rash. Be sure to wash under your fingernails because any plant resin there will continue to spread the rash.  When hiking or camping, wear clothes that will help you to avoid exposure on the skin. This includes long pants, a long-sleeved shirt, tall socks, and hiking boots. You can also apply preventive lotion to your skin to help limit exposure.  If you  suspect that your clothes or outdoor gear came in contact with poison ivy, rinse them off outside with a garden hose before you bring them inside your house. Contact a health care provider if:  You have open sores in the rash area.  You have more redness, swelling, or pain in the affected area.  You have redness that spreads beyond the rash area.  You have fluid, blood, or pus coming from the affected area.  You have a fever.  You have a rash over a large area of your body.  You have a rash on your eyes, mouth, or genitals.  Your rash does not improve after a few days. Get help right away if:  Your face swells or your eyes swell shut.  You have trouble breathing.  You have trouble swallowing. This information is not intended to replace advice given to you by your health care provider. Make sure you discuss any questions you have with your health care provider. Document Released: 03/13/2000 Document Revised: 08/22/2015 Document Reviewed: 08/22/2014 Elsevier Interactive Patient Education  [employer].

## 2017-11-15 NOTE — Progress Notes (Signed)
Chief Complaint  Patient presents with  . Rash    started worsening after she left Thursday. Very itchy. Dr. Redmond School started her on prednisone yesterday (called on call). Wanted to be seen today to see what you think, thinks her face is a littel bit better today compared to yesterday. Should she avoid fish/shellfish now? Going to [place] Friday. Is this contagious? Has soft and runny BM's, stopped taking citrucel.   . Question    wants to know if you think this could be scabies.    Facial rash continued to spread after last visit--spread to entire face, and also arms, trunk, neck, behind right knee, shoulders She has been taking Allegra since last visit.  She called and spoke with Dr. Redmond School yesterday, and started a prednisone pack.  Took 60mg  yesterday, and thinks her face seems a little better today.  Denies side effect from the prednisone. She took temazepam, woke up 3 hours later, had a banana and was able to get back to sleep. She has h/o poison ivy in the past, when she lived in [place], treated with prednisone. She had some soft/loose stools, so stopped the citrucel.  She has many questions, including whether or not this is contagious.  PMH, PSH, SH reviewed  Outpatient Encounter Medications as of 11/15/2017  Medication Sig Note  . atorvastatin (LIPITOR) 40 MG tablet Take 40 mg by mouth at bedtime.    . Calcium Carbonate-Vitamin D (CALTRATE 600+D) 600-400 MG-UNIT per tablet Take 1 tablet by mouth daily at 3 pm.    . escitalopram (LEXAPRO) 10 MG tablet Start 1/2 tablet by mouth once daily, and increase to the full tablet after a week   . Fexofenadine HCl (ALLEGRA PO) Take 1 tablet by mouth daily.   Marland Kitchen HYDROcodone-acetaminophen (NORCO/VICODIN) 5-325 MG tablet Take 1 tablet by mouth every 6 (six) hours as needed for moderate pain.   Marland Kitchen ibuprofen (ADVIL) 200 MG tablet Take 200 mg by mouth every 6 (six) hours as needed. 11/11/2017: Takes 2-3/d  . Multiple Vitamin (MULTIVITAMIN WITH MINERALS)  TABS tablet Take 1 tablet by mouth daily. CENTRUM MULTIVITAMIN   . predniSONE (STERAPRED UNI-PAK 21 TAB) 10 MG (21) TBPK tablet Take as per manufacturers recommendations   . temazepam (RESTORIL) 15 MG capsule TK 1 C PO QD HS   . Methylcellulose, Laxative, (CITRUCEL PO) Take 2 tablets by mouth daily.    No facility-administered encounter medications on file as of 11/15/2017.    No Known Allergies  ROS: no known fever, chills, headache, dizziness, shortness of breath, mouth swelling, bleeding or other concerns. She restarted 1/2 tablet of lexapro, no side effects. Insomnia per HPI (and discussed last week).  PHYSICAL EXAM:  BP 122/72   Pulse 84   Temp 99 F (37.2 C) (Tympanic)   Ht 4\' 11"  (1.499 m)   Wt 126 lb 6.4 oz (57.3 kg)   BMI 25.53 kg/m   Well appearing, somewhat anxious/talkative female, in no distress HEENT: conjunctiva and sclera clear, OP clear Face: patches of erythema and swelling, more coalesced than at last visit, now also involving both cheeks (last week was mainly chin, and small area near her eye). Neck: no lymphadenopathy or mass Heart: regular rate and rhythm Lungs: clear bilaterally Skin: Linear lesions at R abdomen, left forearm, with very small vesicles, especially noted at the wrist/forearm lesion.  Many scattered papules along her arms, shoulders (near bra straps).  Back and abdomen mostly clear, except for horizontal linear raised lesions at right lower abdomen (  waist) and some patchy erythema and papules at the same area on the left lower abdomen. Remainder of skin is clear.  ASSESSMENT/PLAN:  Plant dermatitis   Complete the steroid course as planned. It is possible that this course may be too short. If you don't have complete resolution (but it is improving) by the time you finish your last pill, then call us to extend the course. If you get complete resolution, but the rash starts to return shortly after finishing the course, contact us with the name  of a pharmacy in [place], and we will treat you a little longer with the steroids. If it doesn't get better at all, let us know that too.  I suspect this is poison ivy (vs other plant dermatitis).   You may use topical medication such as calamine or caladryl lotion to help with the itching. Cool compresses may help. Some people benefit from Aveeno bath for itching.

## 2017-11-18 ENCOUNTER — Ambulatory Visit (INDEPENDENT_AMBULATORY_CARE_PROVIDER_SITE_OTHER): Payer: Medicare Other | Admitting: Family Medicine

## 2017-11-18 ENCOUNTER — Encounter: Payer: Self-pay | Admitting: Family Medicine

## 2017-11-18 VITALS — BP 140/86 | HR 88 | Temp 99.2°F | Ht 59.0 in | Wt 124.8 lb

## 2017-11-18 DIAGNOSIS — G4700 Insomnia, unspecified: Secondary | ICD-10-CM | POA: Diagnosis not present

## 2017-11-18 DIAGNOSIS — L255 Unspecified contact dermatitis due to plants, except food: Secondary | ICD-10-CM | POA: Diagnosis not present

## 2017-11-18 MED ORDER — PREDNISONE 10 MG PO TABS: 10.0000 mg | ORAL_TABLET | Freq: Every day | ORAL | 0 refills | Status: DC

## 2017-11-18 NOTE — Patient Instructions (Signed)
  Since you are going out of town, I'm going to give you a prescription for an extra 5 days of 10mg  prednisone tablets.  If your rash isn't resolved by Saturday, then start this new prescription, which is just continuing the lower dose steroid for a little longer. Even if the rash isn't completely gone by the end of the pills, as long as it doesn't start to recur (recurrent rash and itching), then no additional treatment will be needed. You can continue the calamine lotion.  Never use temazepam and alprazolam together. You are on the higher dose of the temazepam.  One way to taper would be to use a lower dose--this is in capsule form, not sure if you would be able to open the capsule and split to 1/2 dose. The other option is to use the alprazolam at bedtime only if needed, if you are unable to fall asleep. The alprazalam is a different type of medication within the same class, but the dose is lower, so can be used to wean yourself off from the temazepam, but is NOT a longterm solution for insomnia. We discussed Belsomra as a safe alternative for long-term use for chronic insomnia (sleeping medication that you can safely take every day).

## 2017-11-18 NOTE — Progress Notes (Signed)
Chief Complaint  Patient presents with  . Rash    feels like rash is not better and has two more doses of prednisone left , one for tonight and one for tomorrow morning.  . Medication question    has a question about her temazepam-wants to know if this is addictive and if it's an opioid. Also if you would refill for her.   . Varicose Veins    has a question about her right leg and her varicose veins and one particular exercise that she is doing for her post-op knee surgery.    Leaves tomorrow for [place].  Concerned that rash hasn't resolved. Calamine is helping. She isn't sure that it is getting better--only has 2 doses left of prednisone.  Concern regarding sleeping med: Taking temazepam x 15 days.  Didn't take it last night, since pain wasn't bad, wanted to see if she could go without it. She couldn't fall asleep, got up to take sleepytime tea and ate a banana.  Still couldn't sleep, so took temazepam at 1:45am.  Was then able to fall asleep.  She is worried about getting addicted.  She completed PT for her knee. Told to continue her HEP--hamstring stretch feels like it is pulling her veins very tight, and is concerned about this.  PMH, PSH, SH reviewed  Outpatient Encounter Medications as of 11/18/2017  Medication Sig Note  . atorvastatin (LIPITOR) 40 MG tablet Take 40 mg by mouth at bedtime.    . Calcium Carbonate-Vitamin D (CALTRATE 600+D) 600-400 MG-UNIT per tablet Take 1 tablet by mouth daily at 3 pm.    . escitalopram (LEXAPRO) 10 MG tablet Start 1/2 tablet by mouth once daily, and increase to the full tablet after a week   . Multiple Vitamin (MULTIVITAMIN WITH MINERALS) TABS tablet Take 1 tablet by mouth daily. CENTRUM MULTIVITAMIN   . predniSONE (STERAPRED UNI-PAK 21 TAB) 10 MG (21) TBPK tablet Take as per manufacturers recommendations   . temazepam (RESTORIL) 15 MG capsule TK 1 C PO QD HS   . HYDROcodone-acetaminophen (NORCO/VICODIN) 5-325 MG tablet Take 1 tablet by mouth every 6  (six) hours as needed for moderate pain.   Marland Kitchen ibuprofen (ADVIL) 200 MG tablet Take 200 mg by mouth every 6 (six) hours as needed. 11/11/2017: Takes 2-3/d  . Methylcellulose, Laxative, (CITRUCEL PO) Take 2 tablets by mouth daily.   . predniSONE (DELTASONE) 10 MG tablet Take 1 tablet (10 mg total) by mouth daily with breakfast.   . [DISCONTINUED] Fexofenadine HCl (ALLEGRA PO) Take 1 tablet by mouth daily.    No facility-administered encounter medications on file as of 11/18/2017.    No Known Allergies  ROS:  No fever, chills, URI symptoms, chest pain, shortness of breath.  Rash per HPI--improving somewhat, but persists.  Pain with hamstring stretch.  Insomnia, anxiety.  Tolerating lexapro without side effects.  PHYSICAL EXAM: BP 140/86   Pulse 88   Temp 99.2 F (37.3 C) (Tympanic)   Ht 4\' 11"  (1.499 m)   Wt 124 lb 12.8 oz (56.6 kg)   BMI 25.21 kg/m   Pleasant, anxious, talkative female, in no distress HEENT: conjunctiva and sclera are clear Skin on face is much less red and inflamed--overall appears significantly better.  Still slight pink at her chin, and right cheek, rest appears significantly better.  Linear lesion left forearm is now flat, lighter, as is the one on her right lower abdomen. Rash on left lower abdomen is barely noticeable.  Legs: some prominent veins  notable, nontender. She demonstrated that putting her right leg out straight and reaching for her foot caused discomfort in the back of the leg, that she thought was pulling on her veins. No edema, normal pulses.  ASSESSMENT/PLAN:  Plant dermatitis - significantly improved; given add'l 5 d of 10mg  prednisone to take only if doesn't completely resolve, or if starts to recur - Plan: predniSONE (DELTASONE) 10 MG tablet  Insomnia, unspecified type - counseled re: meds; suspect insomnia will improve when off prednisone and lexapro in system fully. Can "taper" down to weaker benzo, using xanax prn only  She had been asking for  both temazepam and alprazolam rx's to take with her to [place]. Declined refilling temazepam (not originally rx'd by me and not to take both).  Agree with trying not to use--discussed using lower dose temazepam, vs using alprazolam (which is weaker) prn--same risks with xanax, so also didn't recommend regular use of this. Risks reviewed.  Discussed Belsomra as a safer agent if long-term use is needed.  Reassured that her varicose veins are not causing problems, her pain is related to hamstring/calf stretch.  Continue to do home exercise program as recommended.    Since you are going out of town, I'm going to give you a prescription for an extra 5 days of 10mg  prednisone tablets.  If your rash isn't resolved by Saturday, then start this new prescription, which is just continuing the lower dose steroid for a little longer. Even if the rash isn't completely gone by the end of the pills, as long as it doesn't start to recur (recurrent rash and itching), then no additional treatment will be needed. You can continue the calamine lotion.  Never use temazepam and alprazolam together. You are on the higher dose of the temazepam.  One way to taper would be to use a lower dose--this is in capsule form, not sure if you would be able to open the capsule and split to 1/2 dose. The other option is to use the alprazolam at bedtime only if needed, if you are unable to fall asleep. The alprazalam is a different type of medication within the same class, but the dose is lower, so can be used to wean yourself off from the temazepam, but is NOT a longterm solution for insomnia. We discussed Belsomra as a safe alternative for long-term use for chronic insomnia (sleeping medication that you can safely take every day).

## 2017-11-25 ENCOUNTER — Telehealth: Payer: Self-pay | Admitting: Family Medicine

## 2017-11-25 NOTE — Telephone Encounter (Signed)
Received requested records from [facility]. Sending back for review.  °

## 2017-12-02 ENCOUNTER — Encounter: Payer: Self-pay | Admitting: *Deleted

## 2017-12-09 ENCOUNTER — Encounter: Payer: Self-pay | Admitting: Family Medicine

## 2017-12-09 ENCOUNTER — Ambulatory Visit (INDEPENDENT_AMBULATORY_CARE_PROVIDER_SITE_OTHER): Payer: Medicare Other | Admitting: Family Medicine

## 2017-12-09 VITALS — BP 136/80 | HR 64 | Temp 99.0°F | Ht 59.0 in | Wt 129.8 lb

## 2017-12-09 DIAGNOSIS — H6121 Impacted cerumen, right ear: Secondary | ICD-10-CM | POA: Diagnosis not present

## 2017-12-09 DIAGNOSIS — G4700 Insomnia, unspecified: Secondary | ICD-10-CM

## 2017-12-09 DIAGNOSIS — F411 Generalized anxiety disorder: Secondary | ICD-10-CM

## 2017-12-09 MED ORDER — TEMAZEPAM 7.5 MG PO CAPS: 7.5000 mg | ORAL_CAPSULE | Freq: Every evening | ORAL | 0 refills | Status: DC | PRN

## 2017-12-09 NOTE — Patient Instructions (Addendum)
We sent the lower dose temazepam to your pharmacy (same as 1/2 capsule of your current dose).  You can use this lower dose to try cutting back further and see if you can do without medication at all, versus using it just sporadically, ie if you haven't had a good night sleep in a couple of nights, using it for 1-2 days in a row.  If this isn't covered/affordable, options we discussed were trazadone and Belsomra, as covered alternatives per your insurance.  Let us know if we need to change the prescription.  You do have wax buildup on the right side. Use the Debrox and return here for lavage if not improving.

## 2017-12-09 NOTE — Progress Notes (Signed)
Chief Complaint  Patient presents with  . Insomnia    if she doesn't take the temazepam she cannot sleep.   . Mouth Lesions    has a cold sore she would like for you to look at.  . Cerumen Impaction    thinks she may have wax in her right ear.   Insomnia:  She is having trouble sleeping if she doesn't take the temazepam. This is causing her anxiety.  She has cut back on the dose--she opened capsule and took 1/2 dose for the last 10 days.  That was effective in helping her sleep.  2 nights ago she didn't take any, and couldn't sleep at all. Has 3 capsules left (15mg ). Originally rx'd by ortho, who wouldn't refill, said to get from PCP.  Moods are good on the lexapro. She just had a great trip to [place], got her fill of lobster.  Just worried about not sleeping without medication.  Prior to her trip, she was treated for plant dermatitis--Needed to take the extra prednisone.  Doing much better. Still has slight itching on her right arm, everything else completely resolved.  She notes some decreased hearing on right, thinks she may have wax. She does not have time to have it cleaned today, just wanted to know if that was the reason she was having trouble.  She has a sore on her right lip, thinks it is getting better. Unsure if it is a cold sore. She actually thinks it is related to a different dental floss she used, may have rubbed.  PMH, PSH, SH reviewed  Outpatient Encounter Medications as of 12/09/2017  Medication Sig Note  . atorvastatin (LIPITOR) 40 MG tablet Take 40 mg by mouth at bedtime.    . Calcium Carbonate-Vitamin D (CALTRATE 600+D) 600-400 MG-UNIT per tablet Take 1 tablet by mouth daily at 3 pm.    . escitalopram (LEXAPRO) 10 MG tablet Start 1/2 tablet by mouth once daily, and increase to the full tablet after a week   . ibuprofen (ADVIL) 200 MG tablet Take 200 mg by mouth every 6 (six) hours as needed. 11/11/2017: Takes 2-3/d  . Multiple Vitamin (MULTIVITAMIN WITH MINERALS) TABS  tablet Take 1 tablet by mouth daily. CENTRUM MULTIVITAMIN   . [DISCONTINUED] temazepam (RESTORIL) 15 MG capsule TK 1 C PO QD HS   . HYDROcodone-acetaminophen (NORCO/VICODIN) 5-325 MG tablet Take 1 tablet by mouth every 6 (six) hours as needed for moderate pain.   . Methylcellulose, Laxative, (CITRUCEL PO) Take 2 tablets by mouth daily.   . temazepam (RESTORIL) 7.5 MG capsule Take 1 capsule (7.5 mg total) by mouth at bedtime as needed for sleep.   . [DISCONTINUED] predniSONE (DELTASONE) 10 MG tablet Take 1 tablet (10 mg total) by mouth daily with breakfast.   . [DISCONTINUED] predniSONE (STERAPRED UNI-PAK 21 TAB) 10 MG (21) TBPK tablet Take as per manufacturers recommendations    No facility-administered encounter medications on file as of 12/09/2017.    (had been breaking temazepam 15mg  capsules and splitting dose for the last 10 days, not taking 7.5mg  capsule prior to today's visit).  No Known Allergies  ROS: no fever, chills, URI symptoms, headaches, dizziness, chest pain.  +insomnia.  Anxiety is improved. Decreased hearing R ear.  Sore area right lateral lip  PHYSICAL EXAM: BP 136/80   Pulse 64   Temp 99 F (37.2 C) (Tympanic)   Ht 4\' 11"  (1.499 m)   Wt 129 lb 12.8 oz (58.9 kg)   BMI 26.22  kg/m   Well appearing, pleasant elderly female in good spirits, in no distress.  Just slightly anxious (in discussing sleep meds) HEENT: conjunctiva and sclera are clear EOMI.  Hard cerumen obstructing right EAC, TM not visualized.  Visualized canal appears normal.  Normal TM and EAC on the left. OP--at angle where right upper lip and lower lip join there is a healing linear lesion/fissure.  No scabbing, swelling, erythema.  OP is clear. Neck: no lymphadenopathy, thyromegaly or mass Heart: regular rate and rhythm Lungs: clear   ASSESSMENT/PLAN:  Insomnia, unspecified type - discussed tapering temazepam, with goal of just prn use. Will rx 7.5, and may also cut in half to further taper. If not  covered, consider trazadone or belsomra - Plan: temazepam (RESTORIL) 7.5 MG capsule  Hearing loss of right ear due to cerumen impaction - doesn't have time for lavage. Can try Debrox drops and return for lavage if not resolving  Generalized anxiety disorder - improved, doing well on SSRI   No time for lavage, will return.  Alternatives covered by insurance (if lower dose temazepam isn't covered) would be trazadone and belsomra. Has never tried trazadone. Discussed safety, risks/side effects, and dosing (for trazadone--start with 50mg , 1/2 tablet, titrating up to 100mg  if lower doses ineffective).   Discussed using 7.5, and potentially then splitting further (1/2 capsule), and ultimately to use prn, only if hasn't slept well in a couple of nights, not every night.

## 2017-12-14 ENCOUNTER — Telehealth: Payer: Self-pay | Admitting: Family Medicine

## 2017-12-14 NOTE — Telephone Encounter (Signed)
P.A. TEMAZEPAM 7.5MG  done and approved, pt informed.

## 2017-12-15 ENCOUNTER — Ambulatory Visit (INDEPENDENT_AMBULATORY_CARE_PROVIDER_SITE_OTHER): Payer: Medicare Other | Admitting: Family Medicine

## 2017-12-15 ENCOUNTER — Encounter: Payer: Self-pay | Admitting: Family Medicine

## 2017-12-15 VITALS — BP 128/80 | HR 80 | Ht 59.0 in | Wt 129.6 lb

## 2017-12-15 DIAGNOSIS — H6121 Impacted cerumen, right ear: Secondary | ICD-10-CM

## 2017-12-15 DIAGNOSIS — G4700 Insomnia, unspecified: Secondary | ICD-10-CM | POA: Diagnosis not present

## 2017-12-15 NOTE — Patient Instructions (Signed)
  We removed the wax that was noted at last visit.  You may have some residual decreased hearing related to the fluid.  See how it feels with the hearing aid in place over the next day or so.  If you develop any pain, return for recheck.    Insomnia:  At this point, since you have been off of the temazepam for 5 days, stay off of it.  Continue with the sleep hygiene measures we have previously discussed, and use of Sleepytime tea.   It might be that 6 hours of sleep is enough for you, especially if you don't seem tired during the day, and you feel refreshed when you wake up.

## 2017-12-15 NOTE — Progress Notes (Signed)
Chief Complaint  Patient presents with  . Cerumen Impaction    wants right ear cleaned out. And since last Friday she has not taken any temazepam. Had a hard time falling asleep-after an hour or so she would have some sleepy time tea and would only sleep about 6hrs. Last night she did not sleep much at all.   . Flu Vaccine    prefers to come back in am for flu vaccine.   She didn't pick up the 7.5mg  temazepam dose that was prescribed at her recent visit, because it was very expensive. She went 5 days without taking any temazepam.  The first 4 nights she couldn't all asleep, after 1.5 hours took sleepytime tea and ate a banana, would fall asleep but woke up early , slept 6 hours, and woke up feeling pretty good.  Last night she followed the same routine, but didn't sleep much at all.  Saw surgeon this morning and was given Voltaren gel to use for her persistent knee pain. She has questions about this, which were answered.  She was seen 9/12 with hearing loss in R ear. Noted to have cerumen impaction, but didn't have time for removal. She presents for wax removal/ear lavage today.  PMH, PSH, SH reviewed  Outpatient Encounter Medications as of 12/15/2017  Medication Sig Note  . atorvastatin (LIPITOR) 40 MG tablet Take 40 mg by mouth at bedtime.    . Calcium Carbonate-Vitamin D (CALTRATE 600+D) 600-400 MG-UNIT per tablet Take 1 tablet by mouth daily at 3 pm.    . escitalopram (LEXAPRO) 10 MG tablet Start 1/2 tablet by mouth once daily, and increase to the full tablet after a week   . Multiple Vitamin (MULTIVITAMIN WITH MINERALS) TABS tablet Take 1 tablet by mouth daily. CENTRUM MULTIVITAMIN   . diclofenac sodium (VOLTAREN) 1 % GEL    . ibuprofen (ADVIL) 200 MG tablet Take 200 mg by mouth every 6 (six) hours as needed. 11/11/2017: Takes 2-3/d  . temazepam (RESTORIL) 7.5 MG capsule Take 1 capsule (7.5 mg total) by mouth at bedtime as needed for sleep. (Patient not taking: Reported on 12/15/2017)  12/15/2017: She did not fill this medication due to cost.  . [DISCONTINUED] HYDROcodone-acetaminophen (NORCO/VICODIN) 5-325 MG tablet Take 1 tablet by mouth every 6 (six) hours as needed for moderate pain.   . [DISCONTINUED] Methylcellulose, Laxative, (CITRUCEL PO) Take 2 tablets by mouth daily.    No facility-administered encounter medications on file as of 12/15/2017.    No Known Allergies  ROS: no fever, chills, ear pain.  +decreased hearing. She wears hearing aid on right, and thinks that contributed to the wax buildup on the right.  Denies URI symptoms or other concerns, other than some persistent knee pain and insomnia per HPI  PHYSICAL EXAM:  BP 128/80   Pulse 80   Ht 4\' 11"  (1.499 m)   Wt 129 lb 9.6 oz (58.8 kg)   BMI 26.18 kg/m   Well appearing, pleasant, elderly female, in good spirits, in no distress HEENT conjunctiva and sclera are clear, OP is clear.  R ear: s/p lavage-- small amount of fluid remains in canal.  Some bubbles seen on TM--hard to say if behind or in front.  Light reflex is slightly abnormal, possibly mildly retracted.  L TM and EAC is completely normal. Decreased hearing to finger rubbing bilaterally. Neck: no lymphadenopathy or mass Psych: normal mood, affect, hygiene and grooming.  Talkative with many questions, not as anxious as in the past. Neuro:  alert and oriented, cranial nerves intact, normal gait.    ASSESSMENT/PLAN:  Hearing loss of right ear due to cerumen impaction - cerumen removed, still has decreased hearing (but has chronically, uses hearing aid); tolerated procedure well  Insomnia, unspecified type - 6 hours of sleep may be sufficient; avoid naps, sleep hygiene reviewed. Stay off sleep meds, continue Sleepytime tea  Declined getting flu shot at today's visit--she read an article where it is better to get in the morning, and prefers to return during morning hours for [place] for flu shot.   We removed the wax that was noted at last visit.  You  may have some residual decreased hearing related to the fluid.  See how it feels with the hearing aid in place over the next day or so.  If you develop any pain, return for recheck.    Insomnia:  At this point, since you have been off of the temazepam for 5 days, stay off of it.  Continue with the sleep hygiene measures we have previously discussed, and use of Sleepytime tea.   It might be that 6 hours of sleep is enough for you, especially if you don't seem tired during the day, and you feel refreshed when you wake up.

## 2017-12-30 ENCOUNTER — Other Ambulatory Visit: Payer: Self-pay | Admitting: *Deleted

## 2017-12-30 ENCOUNTER — Telehealth: Payer: Self-pay | Admitting: Family Medicine

## 2017-12-30 DIAGNOSIS — Z5181 Encounter for therapeutic drug level monitoring: Secondary | ICD-10-CM

## 2017-12-30 DIAGNOSIS — E785 Hyperlipidemia, unspecified: Secondary | ICD-10-CM

## 2017-12-30 NOTE — Telephone Encounter (Signed)
Pt called and made her flu shot appt for Monday. She states she would like to have her cholesterol check while here. Please advise pt at [phone].

## 2017-12-30 NOTE — Telephone Encounter (Signed)
We did it in May, as f/u after dose was increased, and it was fine. Normally we do every 6-12 months. It is a little early, but okay if she prefers to get done now.  Please enter lipids and c-met, and advise pt we are also monitoring kidney/liver/diabetes screen with the labs, not just lipids.   (dx hyperlipidemia, med monitor)

## 2017-12-30 NOTE — Telephone Encounter (Signed)
Left message for patient and placed future orders.

## 2018-01-03 ENCOUNTER — Other Ambulatory Visit (INDEPENDENT_AMBULATORY_CARE_PROVIDER_SITE_OTHER): Payer: Medicare Other

## 2018-01-03 DIAGNOSIS — Z23 Encounter for immunization: Secondary | ICD-10-CM

## 2018-01-03 DIAGNOSIS — E785 Hyperlipidemia, unspecified: Secondary | ICD-10-CM

## 2018-01-03 DIAGNOSIS — Z5181 Encounter for therapeutic drug level monitoring: Secondary | ICD-10-CM

## 2018-01-03 LAB — LIPID PANEL
CHOL/HDL RATIO: 2.6 ratio (ref 0.0–4.4)
CHOLESTEROL TOTAL: 181 mg/dL (ref 100–199)
HDL: 70 mg/dL (ref >39)
LDL CALC: 93 mg/dL (ref 0–99)
Triglycerides: 89 mg/dL (ref 0–149)
VLDL Cholesterol Cal: 18 mg/dL (ref 5–40)

## 2018-01-03 LAB — COMPREHENSIVE METABOLIC PANEL
ALBUMIN: 4.1 g/dL (ref 3.5–4.7)
ALT: 18 IU/L (ref 0–32)
AST: 28 IU/L (ref 0–40)
Albumin/Globulin Ratio: 2.1 (ref 1.2–2.2)
Alkaline Phosphatase: 72 IU/L (ref 39–117)
BUN / CREAT RATIO: 20 (ref 12–28)
BUN: 16 mg/dL (ref 8–27)
Bilirubin Total: 0.8 mg/dL (ref 0.0–1.2)
CO2: 24 mmol/L (ref 20–29)
CREATININE: 0.8 mg/dL (ref 0.57–1.00)
Calcium: 9.3 mg/dL (ref 8.7–10.3)
Chloride: 100 mmol/L (ref 96–106)
GFR calc Af Amer: 79 mL/min/{1.73_m2} (ref >59)
GFR, EST NON AFRICAN AMERICAN: 69 mL/min/{1.73_m2} (ref >59)
GLOBULIN, TOTAL: 2 g/dL (ref 1.5–4.5)
GLUCOSE: 83 mg/dL (ref 65–99)
Potassium: 4.4 mmol/L (ref 3.5–5.2)
SODIUM: 141 mmol/L (ref 134–144)
Total Protein: 6.1 g/dL (ref 6.0–8.5)

## 2018-01-04 ENCOUNTER — Encounter: Payer: Self-pay | Admitting: Family Medicine

## 2018-01-31 ENCOUNTER — Telehealth: Payer: Self-pay | Admitting: *Deleted

## 2018-01-31 NOTE — Telephone Encounter (Signed)
Patient advised.

## 2018-01-31 NOTE — Telephone Encounter (Signed)
I always recommend staying on lexapro for a minimum of 6 months.  I do NOT recommend weaning off, especially if she is tolerating it well, without side effects.  Hand tingling--depends on which fingers are involved. It can come from the neck if having neck pain.  It can come from the elbow if affecting the 4th and 5th fingers, or it can be from carpal tunnel syndrome (affects the first 3 fingers).  Hard to know without doing an exam.  She can see her doctor in [place] if it doesn't resolve, or schedule appointment when she returns if it is still going on.

## 2018-01-31 NOTE — Telephone Encounter (Signed)
Left message for patient to call me back. 

## 2018-01-31 NOTE — Telephone Encounter (Signed)
Patient is in [place] and called to say she has been on Lexpro x 3 months and thinks it is time to wean off now-would like to know how. Also she has had B/L hand numbness x 3 weeks wants to know what you think it could be. She will not be back in town until the 15th.

## 2018-02-12 ENCOUNTER — Other Ambulatory Visit: Payer: Self-pay | Admitting: Family Medicine

## 2018-02-12 DIAGNOSIS — F411 Generalized anxiety disorder: Secondary | ICD-10-CM

## 2018-02-17 ENCOUNTER — Telehealth: Payer: Self-pay | Admitting: *Deleted

## 2018-02-17 NOTE — Telephone Encounter (Signed)
Patient called back (see original phone call from 11/4), she states that her numbness in her hands bilaterally is her first 3 fingers so this is probabyl carpal tunnel based on your response. Is this something you absolutely need to see her for or can you recommend something over the phone that she can do? I did tell her you would most liekly need to see her but she insisted that I ask you. Please advise.  This was your response from 11/4: Hand tingling--depends on which fingers are involved. It can come from the neck if having neck pain.  It can come from the elbow if affecting the 4th and 5th fingers, or it can be from carpal tunnel syndrome (affects the first 3 fingers).  Hard to know without doing an exam.  She can see her doctor in [place] if it doesn't resolve, or schedule appointment when she returns if it is still going on.

## 2018-02-17 NOTE — Telephone Encounter (Signed)
Advise pt that she can try an over-the-counter wrist brace for carpal tunnel syndrome. I believe she said it was occurring with both hands, so should try for both wrists.

## 2018-02-17 NOTE — Telephone Encounter (Signed)
Patient advised.

## 2018-03-10 ENCOUNTER — Ambulatory Visit (INDEPENDENT_AMBULATORY_CARE_PROVIDER_SITE_OTHER): Payer: Medicare Other | Admitting: Family Medicine

## 2018-03-10 ENCOUNTER — Encounter: Payer: Self-pay | Admitting: Family Medicine

## 2018-03-10 VITALS — BP 106/62 | HR 84 | Temp 97.7°F | Ht 59.0 in | Wt 127.8 lb

## 2018-03-10 DIAGNOSIS — J309 Allergic rhinitis, unspecified: Secondary | ICD-10-CM

## 2018-03-10 DIAGNOSIS — G5602 Carpal tunnel syndrome, left upper limb: Secondary | ICD-10-CM

## 2018-03-10 DIAGNOSIS — L659 Nonscarring hair loss, unspecified: Secondary | ICD-10-CM

## 2018-03-10 DIAGNOSIS — M779 Enthesopathy, unspecified: Secondary | ICD-10-CM | POA: Diagnosis not present

## 2018-03-10 DIAGNOSIS — R040 Epistaxis: Secondary | ICD-10-CM

## 2018-03-10 DIAGNOSIS — M778 Other enthesopathies, not elsewhere classified: Secondary | ICD-10-CM

## 2018-03-10 NOTE — Progress Notes (Signed)
Chief Complaint  Patient presents with  . Epistaxis    for the last 3 days her left sdie of her nose has been bleeding in the morning, but not this morning. Also her nose has been running for the last 3 weeks but she does not feel like she has a cold or is sick.   . Wrist Pain    you told her that she had carpal tunnel, her left wrist hurts so she brought in a brace she wanted to show you to see if you think it is ok.   . Hair/Scalp Problem    hair is thinning and she is using a shampoo and wanted to now what you thought anout it-Nioxin.   Marland Kitchen other    has pierced ears and feels like her left ear hole is closing and wanted to know if you think she should have it re-pierced and who you would suggest.   Bleeding from the left side of her nose in the morning x 3 days. It starts when she is in the kitchen. It doesn't last long, isn't hard to stop the bleeding. She takes advil about 4x/week, if she has pain, headache. She denies URI symptoms, sinus pain, postnasal drainage. She has some chronic runny nose, clear.  claritin in the past didn't help much, so doesn't take it.  Left wrist pain started just recently started. She decreased the weight at exercise class. She has numbness in the first 3 fingers on both hands. She called with this and was advised to try carpal tunnel brace. She finds the brace very helpful, wears it on the left to sleep.  She hasn't been wearing the brace during the day, just at night.  Hair loss has been somewhat gradual, noticing at the back of her head. She has been taking Biotin vitamins to help. Hasn't noticed any significant change.  Her hairdresser recommended the Nioxin products. She reports that thyroid test has been checked (since hair thinning has been noted). She denies changes to skin, bowels, weight, energy, moods.  She sometimes has trouble getting her earrings in, asking about getting them re-pierced.   PMH, [place], SH reviewed  Outpatient Encounter Medications  as of 03/10/2018  Medication Sig Note  . atorvastatin (LIPITOR) 40 MG tablet Take 40 mg by mouth at bedtime.    . Calcium Carbonate-Vitamin D (CALTRATE 600+D) 600-400 MG-UNIT per tablet Take 1 tablet by mouth daily at 3 pm.    . diclofenac sodium (VOLTAREN) 1 % GEL    . escitalopram (LEXAPRO) 10 MG tablet Start 1/2 tablet by mouth once daily, and increase to the full tablet after a week   . escitalopram (LEXAPRO) 10 MG tablet TAKE 1 TABLET DAILY BY  MOUTH.   Marland Kitchen ibuprofen (ADVIL) 200 MG tablet Take 200 mg by mouth every 6 (six) hours as needed. 11/11/2017: Takes 2-3/d  . Multiple Vitamin (MULTIVITAMIN WITH MINERALS) TABS tablet Take 1 tablet by mouth daily. CENTRUM MULTIVITAMIN   . temazepam (RESTORIL) 7.5 MG capsule Take 1 capsule (7.5 mg total) by mouth at bedtime as needed for sleep. (Patient not taking: Reported on 12/15/2017) 12/15/2017: She did not fill this medication due to cost.   No facility-administered encounter medications on file as of 03/10/2018.    No Known Allergies  ROS: no fever, chills, headache, dizziness, chest pain, shortness of breath or GI complaints.  Left wrist pain, numbness and tingling in first 3 fingers (L>R) and epistaxis per HPI. Moods are good.   PHYSICAL EXAM:  BP 106/62   Pulse 84   Temp 97.7 F (36.5 C) (Tympanic)   Ht 4\' 11"  (1.499 m)   Wt 127 lb 12.8 oz (58 kg)   BMI 25.81 kg/m   Well appearing, pleasant female, in good spirits.  She is wearing hanging, pierced santa earrings. HEENT: PERRL, EOMI, conjunctiva and sclera are clear. Nose-- Moderate edema bilaterally, more on the left than the right. No prominent blood vessel or source of bleeding noted. Sinuses are nontender OP is clear Neck: no lymphadenopathy or mass Heart: regular rate and rhythm Lungs: clear bilaterally Extremities: no edema, normal pulses. Wrists without swelling or erythema, FROM. Negative Tinel +Phalen on the left (thumb numbness started) +Finklestein test on the left, and  tender at the tendon between the 1st MCP/carpals and radius Neuro: alert and oriented, cranial nerves intact. Normal strength, gait Psych: normal mood, affect, hygiene and grooming Skin: normal turgor, no rash. Scalp--skin is normal, no areas of alopecia. She has some mild thinning at the crown.     ASSESSMENT/PLAN:  Carpal tunnel syndrome, left - doing well with wrist brace qHS. wrist pain is NOT related to CTS  Thumb tendonitis - vs Dequervain. Discussed thumb immobilization with spica splint  Epistaxis - no visible source of bleeding, inflamed mucosa related to allergies; Flonase and nasal saline rec. Stop ibuprofen, use Tyl  Allergic rhinitis, unspecified seasonality, unspecified trigger - discussed proper use of Flonase  Hair loss - chronic, no thyroid sx or alopecia. Rec trial of Rogaine for Women (if treatment is desired)   Her ears are pierced, wearing earrings, so not closing up, just some earrings are hard to get in. Discussed wearing stud with thicker post that she can leave in for a while to help dilate the hole.  The curved posts are the hardest to insert, use straight.  Reassured that nothing invasive needs to be done    Use a nasal saline spray or mist frequently throughout the day and before bedtime. This can help keep the nasal mucosa moist. While having nosebleeds, try and avoid aspirin and advil, which can make the bleeding last longer/worse.  Use Tylenol if needed for pain. Consider using a humidifier. Allergies are likely contributing to the inflammation, which can contribute to bleeding.  Since claritin hasn't been effective in the past, I recommend you try using Flonase, 2 sprays into each nostril once daily.  Be sure to aim straight back or slightly to the outside, and not towards the septum. And use the saline spray as well.  Nioxin seems to be a shampoo to thicken your hair, it doesn't help with re-growth.  For the thin area in the back, you can try Rogaine  for Women.  Try and keep a pair of stud earrings in for a couple of weeks.  Ideally, if there is a thicker post, that will help.  But just keeping earrings in (rather than changing daily), and using a straight post, might help make it easier for you.  You have a thumb tendonitis/tenosynovitis that is separate from your carpal tunnel syndrome. Immobilizing the thumb for a week or two should help resolve this. You can get a thumb spica splint from the store to help with this.

## 2018-03-10 NOTE — Patient Instructions (Signed)
  Use a nasal saline spray or mist frequently throughout the day and before bedtime. This can help keep the nasal mucosa moist. While having nosebleeds, try and avoid aspirin and advil, which can make the bleeding last longer/worse.  Use Tylenol if needed for pain. Consider using a humidifier. Allergies are likely contributing to the inflammation, which can contribute to bleeding.  Since claritin hasn't been effective in the past, I recommend you try using Flonase, 2 sprays into each nostril once daily.  Be sure to aim straight back or slightly to the outside, and not towards the septum. And use the saline spray as well.   Nioxin seems to be a shampoo to thicken your hair, it doesn't help with re-growth.  For the thin area in the back, you can try Rogaine for Women.  Try and keep a pair of stud earrings in for a couple of weeks.  Ideally, if there is a thicker post, that will help.  But just keeping earrings in (rather than changing daily), and using a straight post, might help make it easier for you.  You have a thumb tendonitis/tenosynovitis that is separate from your carpal tunnel syndrome. Immobilizing the thumb for a week or two should help resolve this. You can get a thumb spica splint from the store to help with this.

## 2018-03-31 ENCOUNTER — Telehealth: Payer: Self-pay | Admitting: *Deleted

## 2018-03-31 NOTE — Telephone Encounter (Signed)
If she wants to taper, she should cut the tablet in half, and stay on the half dose for a month, to see if she has any recurrent anxiety. If no recurrent symptoms, she can then take 1/2 tablet every other day for a week, then every 3rd day, then stop completely.  If she has any worsening of her moods on the lower dose, go back up to the full pill.

## 2018-03-31 NOTE — Telephone Encounter (Signed)
Left message for patient giving her the instructions.

## 2018-03-31 NOTE — Telephone Encounter (Signed)
Patient called and is in [place] and would like to wean off her Lexapro. Asked that I call her back @ [phone].

## 2018-04-17 ENCOUNTER — Other Ambulatory Visit: Payer: Self-pay | Admitting: Family Medicine

## 2018-06-17 LAB — HM MAMMOGRAPHY: HM Mammogram: NORMAL (ref 0–4)

## 2018-07-21 ENCOUNTER — Ambulatory Visit (INDEPENDENT_AMBULATORY_CARE_PROVIDER_SITE_OTHER): Payer: Medicare Other | Admitting: Family Medicine

## 2018-07-21 ENCOUNTER — Encounter: Payer: Self-pay | Admitting: Family Medicine

## 2018-07-21 ENCOUNTER — Other Ambulatory Visit: Payer: Self-pay

## 2018-07-21 VITALS — Ht 59.0 in | Wt 129.0 lb

## 2018-07-21 DIAGNOSIS — R6889 Other general symptoms and signs: Secondary | ICD-10-CM

## 2018-07-21 DIAGNOSIS — J309 Allergic rhinitis, unspecified: Secondary | ICD-10-CM

## 2018-07-21 DIAGNOSIS — E7800 Pure hypercholesterolemia, unspecified: Secondary | ICD-10-CM

## 2018-07-21 NOTE — Patient Instructions (Addendum)
Your ankle-brachial index on the right was a little low, but it does not sound as though you are having any symptoms related to this.  Continue to take your atorvastatin (and try and watch the diet closely, ideally to get the LDL down below 70; you declined raising the dose of lipitor today). Getting regular exercise helps your circulation, so be sure to exercise daily. We discussed started enteric-coated 81mg  of aspirin daily.  Stop this if you develop any pain in your upper stomach, or if you're having a lot of problems with nosebleeds or bruising. See the information below for more information about peripheral arterial disease. Ask your orthopedist to check your pulses in your feet/ankle at your upcoming visit. If they are diminished, we can request a formal study to evaluate your circulation (or if you develop any symptoms of PVD, as reported below--you did not have any when I asked you today).  Please find out the date and type of tetanus shot you had in January of 2019 (Td or Tdap, and exact date). Once we get this information, we can update your chart.  Shingrix is recommended, when safer to be out and about. You need to get from the pharmacy; it is a series of two injections, given 2 months apart.  For you allergies, please use your flonase everyday--it works best this way.  Use the saline if you are having nasal dryness or bleeding. If you still have runny nose, sneezing, or other allergy symptoms, you can also take claritin along with the Flonase, if needed.    Ankle-Brachial Index Test Why am I having this test? The ankle-brachial index (ABI) test is used to diagnose peripheral vascular disease (PVD). PVD is also known as peripheral arterial disease (PAD). PVD is the blocking or hardening of the arteries anywhere within the circulatory system beyond the heart. PVD is caused by:  Cholesterol deposits in your blood vessels (atherosclerosis). This is the most common cause of this condition.   Irritation and swelling (inflammation) in the blood vessels.  Blood clots in the vessels. Cholesterol deposits cause arteries to narrow. Normal delivery of oxygen to your tissues is affected, causing muscle pain and fatigue. This is called claudication. PVD means that there may also be a buildup of cholesterol:  In your heart. This increases the risk of heart attacks.  In your brain. This increases the risk of strokes. What is being tested? The ankle-brachial index test measures the blood flow in your arms and legs. The blood flow will show if blood vessels in your legs have been narrowed by cholesterol deposits. How do I prepare for this test?  Wear loose clothing.  Do not use any tobacco products, including cigarettes, chewing tobacco, or e-cigarettes, for at least 30 minutes before the test. What happens during the test?  1. You will lie down in a resting position. 2. Your health care provider will use a blood pressure machine and a small ultrasound device (Doppler) to measure the systolic pressures on your upper arms and ankles. Systolic pressure is the pressure inside your arteries when your heart pumps. 3. Systolic pressure measurements will be taken several times, and at several points, on both the ankle and the arm. 4. Your health care provider will divide the highest systolic pressure of the ankle by the highest systolic pressure of the arm. The result is the ankle-brachial pressure ratio, or ABI. Sometimes this test will be repeated after you have exercised on a treadmill for 5 minutes. You may  have leg pain during the exercise portion of the test if you suffer from PVD. If the index number drops after exercise, this may show that PVD is present. How are the results reported? Your test results will be reported as a value that shows the ratio of your ankle pressure to your arm pressure (ABI ratio). Your health care provider will compare your results to normal ranges that were  established after testing a large group of people (reference ranges). Reference ranges may vary among labs and hospitals. For this test, a common reference range is:  ABI ratio of 0.9 to 1.3. What do the results mean? An ABI ratio that is below the reference range is considered abnormal and may indicate PVD in the legs. Talk with your health care provider about what your results mean. Questions to ask your health care provider Ask your health care provider, or the department that is doing the test:  When will my results be ready?  How will I get my results?  What are my treatment options?  What other tests do I need?  What are my next steps? Summary  The ankle-brachial index (ABI) test is used to diagnose peripheral vascular disease (PVD). PVD is also known as peripheral arterial disease (PAD).  The ankle-brachial index test measures the blood flow in your arms and legs.  The highest systolic pressure of the ankle is divided by the highest systolic pressure of the arm. The result is the ABI ratio.  An ABI ratio that is below 0.9 is considered abnormal and may indicate PVD in the legs. This information is not intended to replace advice given to you by your health care provider. Make sure you discuss any questions you have with your health care provider. Document Released: 03/20/2004 Document Revised: 12/08/2016 Document Reviewed: 12/08/2016 Elsevier Interactive Patient Education  2019 [place].   Peripheral Vascular Disease Peripheral vascular disease (PVD) is a disease of the blood vessels. A simple term for PVD is poor circulation. In most cases, PVD narrows the blood vessels that carry blood from your heart to the rest of your body. This can result in a decreased supply of blood to your arms, legs, and internal organs, like your stomach or kidneys. However, it most often affects a person's lower legs and feet. There are two types of PVD.  Organic PVD. This is the more  common type. It is caused by damage to the structure of blood vessels.  Functional PVD. This is caused by conditions that make blood vessels contract and tighten (spasm). Without treatment, PVD tends to get worse over time. PVD can also lead to acute limb ischemia. This is when an arm or leg suddenly has trouble getting enough blood. This is a medical emergency. What are the causes?  Each type of PVD has many different causes. The most common cause of PVD is buildup of a fatty material (plaque) inside your arteries (atherosclerosis). Small amounts of plaque can break off from the walls of the blood vessels and become lodged in a smaller artery. This blocks blood flow and can cause acute limb ischemia. Other common causes of PVD include:  Blood clots that form inside of blood vessels.  Injuries to blood vessels.  Diseases that cause inflammation of blood vessels or cause blood vessel spasms.  Health behaviors and health history that increase your risk of developing PVD. What increases the risk? You are more likely to develop this condition if:  You have a family history of PVD.  You have certain medical conditions, including: ? High cholesterol. ? Diabetes. ? High blood pressure (hypertension). ? Coronary heart disease. ? Past problems with blood clots. ? Past injury, such as burns or a broken bone. These may have damaged blood vessels in your limbs. ? Buerger disease. This is caused by inflamed blood vessels in your hands and feet. ? Some forms of arthritis. ? Rare birth defects that affect the arteries in your legs. ? Kidney disease.  You use tobacco or smoke.  You do not get enough exercise.  You are obese.  You are age 53 or older. What are the signs or symptoms? This condition may cause different symptoms. Your symptoms depend on what part of your body is not getting enough blood. Some common signs and symptoms include:  Cramps in your lower legs. This may be a symptom  of poor leg circulation (claudication).  Pain and weakness in your legs. This happens while you are physically active but goes away when you rest (intermittent claudication).  Leg pain when at rest.  Leg numbness, tingling, or weakness.  Coldness in a leg or foot, especially when compared with the other leg.  Skin or hair changes. These can include: ? Hair loss. ? Shiny skin. ? Pale or bluish skin. ? Thick toenails.  Inability to get or maintain an erection (erectile dysfunction).  Fatigue. People with PVD are more likely to develop ulcers and sores on their toes, feet, or legs. These may take longer than normal to heal. How is this diagnosed? This condition is diagnosed based on:  Your signs and symptoms.  A physical exam and your medical history.  Other tests to find out what is causing your PVD and to determine its severity. Tests may include: ? Blood pressure recordings from your arms and legs and measurements of the strength of your pulses (pulse volume recordings). ? Imaging studies using sound waves to take pictures of the blood flow through your blood vessels (Doppler ultrasound). ? Injecting a dye into your blood vessels before having imaging studies using:  X-rays (angiogram or arteriogram).  Computer-generated X-rays (CT angiogram).  A powerful electromagnetic field and a computer (magnetic resonance angiogram or MRA). How is this treated? Treatment for PVD depends on the cause of your condition and how severe your symptoms are. It also depends on your age. Underlying causes need to be treated and controlled. These include long-term (chronic) conditions, such as diabetes, high cholesterol, and high blood pressure. Treatment includes:  Lifestyle changes, such as: ? Quitting smoking. ? Exercising regularly. ? Following a low-fat, low-cholesterol diet.  Taking medicines, such as: ? Blood thinners to prevent blood clots. ? Medicines to improve blood flow. ?  Medicines to improve your blood cholesterol levels.  Surgical procedures, such as: ? A procedure that uses an inflated balloon to open a blocked artery and improve blood flow (angioplasty). ? A procedure to put in a wire mesh tube to keep a blocked artery open (stent implant). ? Surgery to reroute blood flow around a blocked artery (peripheral bypass surgery). ? Surgery to remove dead tissue from an infected wound on the affected limb. ? Amputation. This is surgical removal of the affected limb. It may be necessary in cases of acute limb ischemia where there has been no improvement through medical or surgical treatments. Follow these instructions at home: Lifestyle  Do not use any products that contain nicotine or tobacco, such as cigarettes and e-cigarettes. If you need help quitting, ask your health care  provider.  Lose weight if you are overweight, and maintain a healthy weight as discussed by your health care provider.  Eat a diet that is low in fat and cholesterol. If you need help, ask your health care provider.  Exercise regularly. Ask your health care provider to suggest some good activities for you. General instructions  Take over-the-counter and prescription medicines only as told by your health care provider.  Take good care of your feet: ? Wear comfortable shoes that fit well. ? Check your feet often for any cuts or sores.  Keep all follow-up visits as told by your health care provider. This is important. Contact a health care provider if:  You have cramps in your legs while walking.  You have leg pain when you are at rest.  You have coldness in a leg or foot.  Your skin changes.  You have erectile dysfunction.  You have cuts or sores on your feet that are not healing. Get help right away if:  Your arm or leg turns cold, numb, and blue.  Your arms or legs become red, warm, swollen, painful, or numb.  You have chest pain or trouble breathing.  You suddenly  have weakness in your face, arm, or leg.  You become very confused or lose the ability to speak.  You suddenly have a very bad headache or lose your vision. Summary  Peripheral vascular disease (PVD) is a disease of the blood vessels.  In most cases, PVD narrows the blood vessels that carry blood from your heart to the rest of your body.  PVD may cause different symptoms. Your symptoms depend on what part of your body is not getting enough blood.  Treatment for PVD depends on the cause of your condition and how severe your symptoms are. This information is not intended to replace advice given to you by your health care provider. Make sure you discuss any questions you have with your health care provider. Document Released: 04/23/2004 Document Revised: 04/23/2016 Document Reviewed: 04/23/2016 Elsevier Interactive Patient Education  2019 [place] and Cholesterol Restricted Eating Plan Getting too much fat and cholesterol in your diet may cause health problems. Choosing the right foods helps keep your fat and cholesterol at normal levels. This can keep you from getting certain diseases.  Meal planning  At meals, divide your plate into four equal parts: ? Fill one-half of your plate with vegetables and green salads. ? Fill one-fourth of your plate with whole grains. ? Fill one-fourth of your plate with low-fat (lean) protein foods.  Eat fish that is high in omega-3 fats at least two times a week. This includes mackerel, tuna, sardines, and salmon.  Eat foods that are high in fiber, such as whole grains, beans, apples, broccoli, carrots, peas, and barley. General tips   Work with your doctor to lose weight if you need to.  Avoid: ? Foods with added sugar. ? Fried foods. ? Foods with partially hydrogenated oils.  Limit alcohol intake to no more than 1 drink a day for nonpregnant women and 2 drinks a day for men. One drink equals 12 oz of beer, 5 oz of wine, or 1 oz of  hard liquor. Reading food labels  Check food labels for: ? Trans fats. ? Partially hydrogenated oils. ? Saturated fat (g) in each serving. ? Cholesterol (mg) in each serving. ? Fiber (g) in each serving.  Choose foods with healthy fats, such as: ? Monounsaturated fats. ? Polyunsaturated fats. ?  Omega-3 fats.  Choose grain products that have whole grains. Look for the word "whole" as the first word in the ingredient list. Cooking  Cook foods using low-fat methods. These include baking, boiling, grilling, and broiling.  Eat more home-cooked foods. Eat at restaurants and buffets less often.  Avoid cooking using saturated fats, such as butter, cream, palm oil, palm kernel oil, and coconut oil. Recommended foods  Fruits  All fresh, canned (in natural juice), or frozen fruits. Vegetables  Fresh or frozen vegetables (raw, steamed, roasted, or grilled). Green salads. Grains  Whole grains, such as whole wheat or whole grain breads, crackers, cereals, and pasta. Unsweetened oatmeal, bulgur, barley, quinoa, or brown rice. Corn or whole wheat flour tortillas. Meats and other protein foods  Ground beef (85% or leaner), grass-fed beef, or beef trimmed of fat. Skinless chicken or [place]. Ground chicken or [place]. Pork trimmed of fat. All fish and seafood. Egg whites. Dried beans, peas, or lentils. Unsalted nuts or seeds. Unsalted canned beans. Nut butters without added sugar or oil. Dairy  Low-fat or nonfat dairy products, such as skim or 1% milk, 2% or reduced-fat cheeses, low-fat and fat-free ricotta or cottage cheese, or plain low-fat and nonfat yogurt. Fats and oils  Tub margarine without trans fats. Light or reduced-fat mayonnaise and salad dressings. Avocado. Olive, canola, sesame, or safflower oils. The items listed above may not be a complete list of foods and beverages you can eat. Contact a dietitian for more information. Foods to avoid Fruits  Canned fruit in heavy syrup.  Fruit in cream or butter sauce. Fried fruit. Vegetables  Vegetables cooked in cheese, cream, or butter sauce. Fried vegetables. Grains  White bread. White pasta. White rice. Cornbread. Bagels, pastries, and croissants. Crackers and snack foods that contain trans fat and hydrogenated oils. Meats and other protein foods  Fatty cuts of meat. Ribs, chicken wings, bacon, sausage, bologna, salami, chitterlings, fatback, hot dogs, bratwurst, and packaged lunch meats. Liver and organ meats. Whole eggs and egg yolks. Chicken and [place] with skin. Fried meat. Dairy  Whole or 2% milk, cream, half-and-half, and cream cheese. Whole milk cheeses. Whole-fat or sweetened yogurt. Full-fat cheeses. Nondairy creamers and whipped toppings. Processed cheese, cheese spreads, and cheese curds. Beverages  Alcohol. Sugar-sweetened drinks such as sodas, lemonade, and fruit drinks. Fats and oils  Butter, stick margarine, lard, shortening, ghee, or bacon fat. Coconut, palm kernel, and palm oils. Sweets and desserts  Corn syrup, sugars, honey, and molasses. Candy. Jam and jelly. Syrup. Sweetened cereals. Cookies, pies, cakes, donuts, muffins, and ice cream. The items listed above may not be a complete list of foods and beverages you should avoid. Contact a dietitian for more information. Summary  Choosing the right foods helps keep your fat and cholesterol at normal levels. This can keep you from getting certain diseases.  At meals, fill one-half of your plate with vegetables and green salads.  Eat high-fiber foods, like whole grains, beans, apples, carrots, peas, and barley.  Limit added sugar, saturated fats, alcohol, and fried foods. This information is not intended to replace advice given to you by your health care provider. Make sure you discuss any questions you have with your health care provider. Document Released: 09/15/2011 Document Revised: 11/17/2017 Document Reviewed: 12/01/2016 Elsevier  Interactive Patient Education  2019 [employer].

## 2018-07-21 NOTE — Progress Notes (Signed)
Start time: 9:45 End time: 10:15  Virtual Visit via Telephone Note  I connected with Alexis Hines on 07/21/18 by telephone and verified that I am speaking with the correct person using two identifiers.  Patient has no access for video visit, only has a flip phone. Patient is at home, alone. MD is in the office.  I discussed the limitations, risks, security and privacy concerns of performing an evaluation and management service by telephone and the availability of in person appointments. I also discussed with the patient that there may be a patient responsible charge related to this service. The patient expressed understanding and agreed to proceed.   History of Present Illness:  Chief Complaint  Patient presents with  . Consult    has results from [place]. Had housecall nurse that came out and said circulation in her right leg was poor-possible PAD. Would like to discuss.    Every year, while in [place], she gets a visit from [place] (through [employer]). This information is apparently sent to the PCP that she has in [place] (not received here).  She had visit with them on 06/07/2018, and would like to discuss the results.  She reports that a urine specimen was normal, P 100, BP 116/70, weight 130#, BMI 26.25 She had a normal mini-cog screening (remembered 3 words and drew clock).  It was recommended for her to get Shingrix (had tried but wasn't available at pharmacy).  In reviewing her immunizations, mentioned she was past due for tetanus--she states that she had one in 03/2017. Did not have the date/type, but would get to [place].    She states that [facility] did a "Circulation test" (ABI), results were: Left 1.08, Right 0.78 Told "slightly abnormal", told to give [place] PCP the report. She forgot to discuss this with her PCP at her March visit there. Last month she got a call to see if she followed up on this, told her she may have peripheral arterial disease.  She denies  claudication--never has pain in her foot or calf, just pain in her right hip and right knee joints (and has f/u with ortho soon to discuss this; she had surgery on R).  She reads to me the labs she had done through her [place] PCP on 06/09/2018: Chol 185, TG 97, HDL 74, LDL calc 92, VLDL 19, chol/HDL ratio 2.5 Also has c-met, Cr 0.81, nothing flagged as abnormal.  Mammogram in [place], 06/17/2018--normal per pt. Breasts are not dense.  Saw a hand surgeon in [place] on 04/28/2018.  She had been seen here, with concern for CTS.  Felt she had tendonitis, poss DeQuervain's and told to try thumb spica.  She reports X-ray was done, and she had steroid injection (for tenosynovitis) in the wrist near the thumb, and it is much better.  Patient is also complaining of a runny nose, which is fairly chronic.  She reports she had been using Flonase, but reports she recently has not been using it daily.  Has also used nasal saline (recommended when she had nosebleed in the past).  She mentioned wanting to see her grandkids soon.  PMH, [facility] reviewed  Outpatient Encounter Medications as of 07/21/2018  Medication Sig Note  . atorvastatin (LIPITOR) 40 MG tablet Take 40 mg by mouth at bedtime.    Marland Kitchen BIOTIN 5000 PO Take 1 tablet by mouth daily.   . Calcium Carbonate-Vitamin D (CALTRATE 600+D) 600-400 MG-UNIT per tablet Take 1 tablet by mouth daily at 3 pm.    .  fluticasone (FLONASE) 50 MCG/ACT nasal spray Place into both nostrils daily. 07/21/2018: Admits to not using every day  . Multiple Vitamin (MULTIVITAMIN WITH MINERALS) TABS tablet Take 1 tablet by mouth daily. CENTRUM MULTIVITAMIN   . ibuprofen (ADVIL) 200 MG tablet Take 200 mg by mouth every 6 (six) hours as needed. 11/11/2017: Takes 2-3/d  . [DISCONTINUED] diclofenac sodium (VOLTAREN) 1 % GEL    . [DISCONTINUED] escitalopram (LEXAPRO) 10 MG tablet Start 1/2 tablet by mouth once daily, and increase to the full tablet after a week   . [DISCONTINUED] escitalopram (LEXAPRO)  10 MG tablet TAKE 1 TABLET DAILY BY  MOUTH.   . [DISCONTINUED] escitalopram (LEXAPRO) 10 MG tablet TAKE 1 TABLET BY MOUTH  DAILY   . [DISCONTINUED] temazepam (RESTORIL) 7.5 MG capsule Take 1 capsule (7.5 mg total) by mouth at bedtime as needed for sleep. (Patient not taking: Reported on 12/15/2017) 12/15/2017: She did not fill this medication due to cost.   No facility-administered encounter medications on file as of 07/21/2018.    No Known Allergies  ROS:  No fever, chills, headaches, dizziness, cough, shortness of breath, sinus pain, ear pain. No bleeding, bruising.  No claudication, chest pain.  Right hip and Right knee pain. Thumb/wrist pain has improved s/p injection. Moods are good.  Observations/Objective:  Ht '4\' 11"'  (1.499 m)   Wt 129 lb (58.5 kg)   BMI 26.05 kg/m   Exam is limited due to the virtual nature of the visit, telephone only. She is alert, oriented, with good memory. She is in no distress, in good spirits, normal mood, affect.   Assessment and Plan:  Abnormal ankle brachial index (ABI) - diminished on R, asymptomatic. Consider formal repeat, +/- doppler if abnl and sx. Add 13m ASA. Cont statin, exercise. To ask ortho to check pulses at appt.  Allergic rhinitis, unspecified seasonality, unspecified trigger - encouraged to use flonase daily. Consider adding antihistamine if not adequately treated with flonase alone  Pure hypercholesterolemia - ideally, LDL<70 rec, currently at 92-93. She declines increasing statin dose; sending info on low cholesterol diet  Patient to find out the date and type of tetanus shot she had in 03/2017 Ask ortho to check pulses in RLE--reassuring.  If pulses diminished.  Her mammo was abstracted.  Declines increasing lipitor; given info on low cholesterol diet, to try and get LDL lower through diet.  Start enteric coated 829mof aspirin daily. Stop if GI issues develop.  Counseled re: daily flonase, antihistamines prn.  Follow Up  Instructions:  AVS provided--printed/mailed to patient, and discussed in detail.  I discussed the assessment and treatment plan with the patient. The patient was provided an opportunity to ask questions and all were answered. The patient agreed with the plan and demonstrated an understanding of the instructions.   The patient was advised to call back or seek an in-person evaluation if the symptoms worsen or if the condition fails to improve as anticipated.  I provided 30 minutes of non-face-to-face time during this encounter. More than 1/2 was spent counseling.  EvVikki PortsMD

## 2018-07-27 ENCOUNTER — Telehealth: Payer: Self-pay | Admitting: *Deleted

## 2018-07-27 NOTE — Telephone Encounter (Signed)
She did not mention GI complaints at last visit. I do not do stool studies for "aches"--only if having ongoing diarrhea, suspect infection, recent travel, etc.  My suggestion is to be sure she is drinking plenty of water, eating high fiber diet, and getting regular exercise, in case constipation or limitations in activity are affecting her bowels. If she is gassy, she may cut back some on dairy, and eat a bland diet.   If she develops black or bloody stools, mucus in the stool, diarrhea, worsening abdominal pain, or fever, she will need a visit.

## 2018-07-27 NOTE — Telephone Encounter (Signed)
Left message for patient

## 2018-07-27 NOTE — Telephone Encounter (Signed)
Patient called and stated that she has been having "stomach aches.' She said she has not had a colonoscopy in 8 years but was wondering if you would just order some stool studies (3 vials) on her. She had these done in the past. She just had a virtual visit and isn't interested in doing another. Please advise.

## 2018-08-11 ENCOUNTER — Encounter: Payer: Self-pay | Admitting: *Deleted

## 2018-08-11 ENCOUNTER — Telehealth: Payer: Self-pay | Admitting: *Deleted

## 2018-08-11 DIAGNOSIS — Z1211 Encounter for screening for malignant neoplasm of colon: Secondary | ICD-10-CM

## 2018-08-11 NOTE — Telephone Encounter (Signed)
Patient said she has never done Cologuard and would like to do this. Should I order?

## 2018-08-11 NOTE — Telephone Encounter (Signed)
As long as she understands she needs to take it to the [employer] (is that right?), and that if it comes back positive, that she will be sent to GI for colonoscopy.

## 2018-08-11 NOTE — Telephone Encounter (Signed)
Yes, she was told at her 4/23 visit (phone) to start 76m aspirin daily (enteric coated kind).  Her PCP in [place] does most of her screening procedures.  I have no idea when the last time she may have done hemoccult cards or if she ever did Cologuard--nothing that we have done for her.  Since she is under 876 her options for screening for colon cancer include Cologuard (would need colonoscopy if abnormal, and is a screening tool good for 3 years) vs hemassure/hemoccult or whatever testing we are doing these days, and could be mailed a kit. That only tests for occult blood, whereas the Cologuard also looks at DNA and may be able to pick up polyp/cancer earlier than the other.

## 2018-08-11 NOTE — Telephone Encounter (Signed)
Patient called to let you know that she had Tdap in [place] 04/26/2017, I went ahead and abstracted. She also wanted to know if you had advised her to take an 81mg  baby aspirin? And she would like to know if you would let her do stool cards without seeing you. She just needs to get this concern off her mind. She has had two friends that have been diagnosed with colon cancer recently and she is constipated sometimes and really wants to do these cards.

## 2018-08-15 NOTE — Telephone Encounter (Signed)
Done

## 2018-08-15 NOTE — Telephone Encounter (Signed)
Patient said she fully understands.

## 2018-08-15 NOTE — Telephone Encounter (Signed)
I ordered (you'll need to print)

## 2018-08-29 ENCOUNTER — Encounter: Payer: Self-pay | Admitting: Family Medicine

## 2018-09-02 LAB — COLOGUARD: Cologuard: NEGATIVE

## 2018-09-26 ENCOUNTER — Telehealth: Payer: Self-pay | Admitting: *Deleted

## 2018-09-26 NOTE — Telephone Encounter (Signed)
Ensure that she is using Flonase 2 sprays into each nostril every day. If she is doing this, AND taking zyrtec daily, and still having symptoms, she should have a virtual visit.  You can let her know of the community testing sites to get COVID testing (since she wants for travel reasons, not due to any exposure)

## 2018-09-26 NOTE — Telephone Encounter (Signed)
Patient advised.

## 2018-09-26 NOTE — Telephone Encounter (Signed)
Patient called and states that she has been taking zyrtec for 30 days. It has helped with her itchy eyes but not with her runny nose and sneezing. She wanted me to ask you for another suggestion. She also would like to have a COVID test done, she has no symptoms but would like to be tested as she may potentially be travelling in the near future.

## 2018-11-03 ENCOUNTER — Telehealth: Payer: Self-pay | Admitting: *Deleted

## 2018-11-03 NOTE — Telephone Encounter (Signed)
No other suggestions.  If the drippy nose is not at all bothersome, nothing else needs to be done.  If it is bothersome to her, we can discuss prescription options to additionally treat runny nose (ie atrovent nasal)

## 2018-11-03 NOTE — Telephone Encounter (Signed)
Patient called and she has been taking zyrtec x 2 months and flonase x 1 month. She continues to have a drippy nose, nothing she can't tolerate or needs to be seen for. Was just calling to see if you had any other suggestions, thanks.

## 2018-11-03 NOTE — Telephone Encounter (Signed)
Patient advised.

## 2018-11-24 ENCOUNTER — Other Ambulatory Visit: Payer: Self-pay | Admitting: *Deleted

## 2018-11-24 ENCOUNTER — Telehealth: Payer: Self-pay | Admitting: *Deleted

## 2018-11-24 NOTE — Telephone Encounter (Signed)
Patient called and stated that she had seen you last year because her hand/wrist was hurting. She saw hand surgeon while in [facility] and was given shot and told she had carpal tunnel-shot helped for a while and was thinking that she needs to see a hand surgeon here-would you refer her?

## 2018-11-24 NOTE — Telephone Encounter (Signed)
She was evaluated for this by me last in December.  Okay to refer to [facility] (Dr. Burney Gauze, etc)

## 2018-11-28 NOTE — Telephone Encounter (Signed)
Patient scheduled with Dr Leanora Cover at [facility] for 12/07/2018 @ 2:30pm.

## 2018-11-29 DIAGNOSIS — M654 Radial styloid tenosynovitis [de Quervain]: Secondary | ICD-10-CM

## 2018-11-29 HISTORY — DX: Radial styloid tenosynovitis (de quervain): M65.4

## 2018-12-01 ENCOUNTER — Telehealth: Payer: Self-pay | Admitting: *Deleted

## 2018-12-01 NOTE — Telephone Encounter (Signed)
Advise pt--even though it isn't allergies, I think an antihistamine such as claritin is a good idea to try.  This can help with runny nose, one pill lasts all day, and is well tolerated. She can also use Neti-pot.  If she develops sinus pain, or persistent postnasal drainage, she can take sudafed--she doesn't have hypertension, so has no contraindication.  This can raise blood pressure and pulse, and cause insomnia, so use it sparingly, only if absolutely needed. If she ultimately develops cough, she can try mucinex or robitussin DM as needed.  If she develops discolored mucus, fever or other symptoms, she should have virtual visit.  If she has worsening symptoms to suggest COVID (fever, aches, fatigue, cough, loss of taste/smell), she needs virtual visit and we can then send for testing.

## 2018-12-01 NOTE — Telephone Encounter (Signed)
Patient advised.

## 2018-12-01 NOTE — Telephone Encounter (Signed)
Patient called and said she has a "cold." She doesn't believe that it's allergies. Doesn't have any discolored mucus or temp. She is going to use her netti pot this evening but was looking for an OTC medication recommendation. I did offer VV, but she felt like that was not needed. Please advise.

## 2018-12-01 NOTE — Telephone Encounter (Signed)
Patient called and said she has a "cold." It's not

## 2018-12-07 DIAGNOSIS — M654 Radial styloid tenosynovitis [de Quervain]: Secondary | ICD-10-CM | POA: Insufficient documentation

## 2019-07-28 ENCOUNTER — Encounter: Payer: Self-pay | Admitting: Family Medicine

## 2019-07-28 ENCOUNTER — Ambulatory Visit (INDEPENDENT_AMBULATORY_CARE_PROVIDER_SITE_OTHER): Payer: Medicare Other | Admitting: Family Medicine

## 2019-07-28 ENCOUNTER — Other Ambulatory Visit: Payer: Self-pay

## 2019-07-28 VITALS — BP 110/76 | HR 68 | Temp 96.9°F | Wt 126.2 lb

## 2019-07-28 DIAGNOSIS — N390 Urinary tract infection, site not specified: Secondary | ICD-10-CM

## 2019-07-28 LAB — POCT URINALYSIS DIP (PROADVANTAGE DEVICE)
Bilirubin, UA: NEGATIVE
Blood, UA: NEGATIVE
Glucose, UA: NEGATIVE mg/dL
Ketones, POC UA: NEGATIVE mg/dL
Leukocytes, UA: NEGATIVE
Nitrite, UA: NEGATIVE
Protein Ur, POC: 30 mg/dL — AB
Specific Gravity, Urine: 1.025
Urobilinogen, Ur: 0.2
pH, UA: 6 (ref 5.0–8.0)

## 2019-07-28 MED ORDER — SULFAMETHOXAZOLE-TRIMETHOPRIM 800-160 MG PO TABS: 1.0000 | ORAL_TABLET | Freq: Two times a day (BID) | ORAL | 0 refills | Status: DC

## 2019-07-28 NOTE — Progress Notes (Signed)
   Subjective:    Patient ID: Alexis Hines, female    DOB: [birth year], 86 y.o.   MRN: [id number]  HPI She complains of a 1 week history of difficulty with urinary urgency, frequency and occasional incontinence.  No fever, chills or abdominal pain.   Review of Systems     Objective:   Physical Exam Alert and in no distress.  Urine microscopic was negative.  Dipstick was also negative.       Assessment & Plan:  Urinary tract infection without hematuria, site unspecified - Plan: sulfamethoxazole-trimethoprim (BACTRIM DS) 800-160 MG tablet In spite of the dipstick and microscopic being negative, I think is worthwhile treating since she is symptomatic from this.  Also recommend she increase her fluid as her specific gravity was 1.025 she was comfortable with that.

## 2019-07-28 NOTE — Addendum Note (Signed)
Addended by: Elyse Jarvis on: 07/28/2019 01:32 PM   Modules accepted: Orders

## 2019-08-02 ENCOUNTER — Encounter: Payer: Self-pay | Admitting: Family Medicine

## 2019-08-02 NOTE — Progress Notes (Signed)
Chief Complaint  Patient presents with  . Medical Clearance    having right hip surgery 08/29/19. Looks like she had EKG 4/19. Had UA 3/21 and labs as well.     Patient presents for surgical clearance for hip surgery (R total hip replacement) planned in June by Dr. Alvan Dame. She is having R hip and R knee pain.  Dr. Theda Sers had told her the knee is fine, the pain is coming from her hip.  She underwent R total knee replacement in 2019 without problems. She had seen cardiologist in [place] and had a normal nuclear stress test in 07/05/2017. Carotid US 06/16/17: no hemodynamically significant lesions She currently denies any chest pain, palpitations, or shortness of breath with exertion.  Hyperlipidemia.  She is compliant with low cholesterol diet and atorvastatin, and denies side effects. She had been on 59m of atorvastatin.  [place] numbers looked excellent, so she asked the PA (near checkout, hadn't been the provider that she saw that day) if she could drop to 261m  She glanced at the numbers and said "sure".  Patient reports that she has been alternating the dose of 20 and 4017mince 05/2019.  She brought in copies of recent labs done (05/2019), see below.  Last [place] in [place] [place] dated 05/2018; TC 185, LDL 92, HDL 74 and normal c-met. Last lipids here: Lab Results  Component Value Date   CHOL 181 01/03/2018   HDL 70 01/03/2018   LDLCALC 93 01/03/2018   TRIG 89 01/03/2018   CHOLHDL 2.6 01/03/2018   She had ABI done by [facility].  Left 1.08, Right 0.78.  We had discussed possibly getting a formal ABI. She spoke with Dr. ColTheda Sersout it at her visit and was told that her circulation was fine. She goes to exercise classes regularly, and denies pain.  Denies any calf pain. Has pain walking only (in hip and knee on the right, not calf).  She saw Dr. LalRedmond School30 with urinary complaints. She had a lot of  urgency, with small amounts.  Urine dip was normal (30 protein, SG 1.025, o/w normal), but she was treated for UTI with a course of bactrim, which she is still taking.  Culture was not sent.  She still has small volumes, but denies urine being dark.  She has been trying to drink more water. Denies dysuria, abdominal pain or flank pain.  She had injection for DeQuervain's tenosynovitis on the left by Dr. KuzFredna Dow 11/2018 and 02/2019. Also had one [place] 03[place].  She has occasional pain, nothing consistent.  Didn't need an injection when in [place] this year.  No longer having problems with anxiety.  She has had her COVID vaccines.  PMH, PSH, SH and FH were reviewed and updated  Outpatient Encounter Medications as of 08/03/2019  Medication Sig Note  . atorvastatin (LIPITOR) 40 MG tablet Take 20 mg by mouth at bedtime.  08/03/2019: Alternating 37m108md 40mg63mry other day  . BIOTIN 5000 PO Take 1 tablet by mouth daily.   . Calcium Carbonate-Vitamin D (CALTRATE 600+D) 600-400 MG-UNIT per tablet Take 1 tablet by mouth daily at 3 pm.    . diclofenac Sodium (VOLTAREN) 1 % GEL APP 4 GRAMS AA QID   . Multiple Vitamin (MULTIVITAMIN WITH MINERALS) TABS tablet Take 1 tablet by mouth daily. CENTRUM MULTIVITAMIN   . sulfamethoxazole-trimethoprim (BACTRIM DS) 800-160 MG tablet Take  1 tablet by mouth 2 (two) times daily.   . fluticasone (FLONASE) 50 MCG/ACT nasal spray Place into both nostrils daily. 07/21/2018: Admits to not using every day  . ibuprofen (ADVIL) 200 MG tablet Take 200 mg by mouth every 6 (six) hours as needed. 08/03/2019: Uses prn for headache, 2-3x/week, takes with food   No facility-administered encounter medications on file as of 08/03/2019.   No Known Allergies  ROS: The patient denies fever, weight changes, headaches,  vision changes, decreased hearing, ear pain, sore throat, breast concerns, chest pain, palpitations, dizziness, syncope, dyspnea on exertion, cough, swelling, nausea,  vomiting, diarrhea, constipation, abdominal pain, melena, hematochezia, indigestion/heartburn, hematuria, incontinence, dysuria, vaginal discharge, odor or itch. She denies numbness, tingling, weakness, tremor, suspicious skin lesions, depression, anxiety, abnormal bleeding, or enlarged lymph nodes. Occasional constipation, uses dulcolax stool softener prn. Right hip and pain per HPI. Some persistent mild urinary urgency, no dysuria L thumb pain/deQuervain's improved, only intermittent discomfort Some itching of her scalp and back, no known rash.   PHYSICAL EXAM:  BP 120/86   Pulse 84   Temp 98.7 F (37.1 C) (Tympanic)   Ht 4' 10.5" (1.486 m)   Wt 123 lb 12.8 oz (56.2 kg)   BMI 25.43 kg/m   Wt Readings from Last 3 Encounters:  08/03/19 123 lb 12.8 oz (56.2 kg)  07/28/19 126 lb 3.2 oz (57.2 kg)  07/21/18 129 lb (58.5 kg)   Well-appearing, pleasant female, in good spirits, in no distress HEENT: conjunctiva and sclera clear, EOMI, wearing mask Neck: no lymphadenopathy, thyromegaly or mass, no carotid bruit Heart: regular rate and rhythm, no murmur Lungs: clear bilaterally, no wheezes, rales ronchi Abdomen: soft, nontender, no organomegaly or mass Back: no spinal or CVA tenderness Extremities: no edema, normal pulses Psych: normal mood, affect, hygiene, grooming, speech, eye contact Neuro: alert and oriented, normal gait, strength  Labs brought in today, done in [facility] 06/02/19: Normal CK Vitamin D 25-OH was 44.9 TSH 0.395 (low) A1c 5.5 TC 175, TG 79, HDL 71, LDL 89 Urine--trace occult blood, rest was normal. Glu 99, c-met normal Normal CBC   ASSESSMENT/PLAN:  Pre-operative clearance - Cleared for surgery, will fax forms back to orthopedic surgeon's office  Pure hypercholesterolemia - discussed lab results, goals.  Encouraged her to stay at the 21m daily (rather than lower dose)  Abnormal ankle brachial index (ABI) - She has no claudication, and normal pulses on  exam  Abnormal TSH - will repeat TSH with free T4.  no symptoms related to thyroid - Plan: TSH, T4, Free  She will complete her course of Bactrim from Dr. LRedmond School  I do not suspect this was truly an infection.  She was encouraged to drink more water.  40 min face to face visit, with additional time spent in chart review, documentation, and form completion.

## 2019-08-03 ENCOUNTER — Ambulatory Visit (INDEPENDENT_AMBULATORY_CARE_PROVIDER_SITE_OTHER): Payer: Medicare Other | Admitting: Family Medicine

## 2019-08-03 ENCOUNTER — Other Ambulatory Visit: Payer: Self-pay

## 2019-08-03 ENCOUNTER — Encounter: Payer: Self-pay | Admitting: Family Medicine

## 2019-08-03 VITALS — BP 120/86 | HR 84 | Temp 98.7°F | Ht 58.5 in | Wt 123.8 lb

## 2019-08-03 DIAGNOSIS — R7989 Other specified abnormal findings of blood chemistry: Secondary | ICD-10-CM

## 2019-08-03 DIAGNOSIS — Z01818 Encounter for other preprocedural examination: Secondary | ICD-10-CM

## 2019-08-03 DIAGNOSIS — E7800 Pure hypercholesterolemia, unspecified: Secondary | ICD-10-CM | POA: Diagnosis not present

## 2019-08-03 DIAGNOSIS — R6889 Other general symptoms and signs: Secondary | ICD-10-CM

## 2019-08-03 NOTE — Patient Instructions (Addendum)
Go back to taking 40mg  of atorvastatin daily.  Your LDL was similar to prior values (not significantly lower), and are GOOD on the 40mg  dose.  Be sure to increase your fluid intake.  You should have 6-8 glasses of water (or other non-caffeinated, non-alcoholic beverage). For every caffeinated beverage or alcoholic beverage that you drink, you need to drink an EXTRA glass of water.  Your urine should be very pale, almost clear, as an indicator that you are well hydrated.

## 2019-08-04 LAB — TSH: TSH: 0.821 u[IU]/mL (ref 0.450–4.500)

## 2019-08-04 LAB — T4, FREE: Free T4: 1.14 ng/dL (ref 0.82–1.77)

## 2019-08-07 ENCOUNTER — Telehealth: Payer: Self-pay | Admitting: Internal Medicine

## 2019-08-07 MED ORDER — ATORVASTATIN CALCIUM 40 MG PO TABS: 40.0000 mg | ORAL_TABLET | Freq: Every day | ORAL | 3 refills | Status: DC

## 2019-08-07 NOTE — Telephone Encounter (Signed)
Pt states she needs a refill on her atorvastatin. Looks like labs were done march 2020 in [place] and you noted to stay on 40mg . Can I refill this since you haven't done cholesterol

## 2019-08-07 NOTE — Telephone Encounter (Signed)
Yes, okay to refill x 1 year

## 2019-08-07 NOTE — Telephone Encounter (Signed)
Sent to [facility] for a year supply

## 2019-08-10 ENCOUNTER — Encounter: Payer: Self-pay | Admitting: *Deleted

## 2019-08-14 ENCOUNTER — Telehealth: Payer: Self-pay | Admitting: *Deleted

## 2019-08-14 NOTE — Telephone Encounter (Signed)
Patient called and left a message and wanted me to let you know that last year at her virtual visit you went through her labs and recommended that she take a 81mg  ASA. This year her doctor in [facility] told her that her labs were so good that she could stop taking the 81mg  ASA and she forgot to tell you that. So she only took for about 2mo total. She wanted me to call her back if you needed to let her know anything.

## 2019-08-16 ENCOUNTER — Encounter: Payer: Self-pay | Admitting: Physician Assistant

## 2019-08-16 ENCOUNTER — Other Ambulatory Visit: Payer: Self-pay

## 2019-08-16 ENCOUNTER — Ambulatory Visit: Payer: Medicare Other | Admitting: Physician Assistant

## 2019-08-16 ENCOUNTER — Encounter: Payer: Self-pay | Admitting: Family Medicine

## 2019-08-16 DIAGNOSIS — Z1283 Encounter for screening for malignant neoplasm of skin: Secondary | ICD-10-CM | POA: Diagnosis not present

## 2019-08-16 DIAGNOSIS — Z86007 Personal history of in-situ neoplasm of skin: Secondary | ICD-10-CM

## 2019-08-16 DIAGNOSIS — Z85828 Personal history of other malignant neoplasm of skin: Secondary | ICD-10-CM

## 2019-08-16 NOTE — Progress Notes (Signed)
   Follow up Visit  Subjective  CHEYENNA TRAW is a 86 y.o. female who presents for the following: Annual Exam (no new concerns). 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 No issues from those areas. No particular areas of concern. She is having hip replacement June 1.   Objective  Well appearing patient in no apparent distress; mood and affect are within normal limits.  A full examination was performed including scalp, head, eyes, ears, nose, lips, neck, chest, axillae, abdomen, back, buttocks, bilateral upper extremities, bilateral lower extremities, hands, feet, fingers, toes, fingernails, and toenails. All findings within normal limits unless otherwise noted below. No suspicious moles noted on back.   Objective  Dorsum of Nose: Surgical scar examined no sign of recurrence.   Objective  Left Anterior Mandible, Right Temple: Dyspigmented scar.   Objective  Mid Back: Total body skin exam  Assessment & Plan  History of basal cell carcinoma (BCC) Dorsum of Nose  Personal history of in-situ neoplasm of skin (2) Right Temple; Left Anterior Mandible  Skin exam for malignant neoplasm Mid Back

## 2019-08-17 NOTE — H&P (Signed)
TOTAL HIP ADMISSION H&P  Patient is admitted for right total hip arthroplasty.  Subjective:  Chief Complaint:   Right hip primary OA / pain  HPI: Alexis Hines, 86 y.o. female, has a history of pain and functional disability in the right hip(s) due to arthritis and patient has failed non-surgical conservative treatments for greater than 12 weeks to include NSAID's and/or analgesics, corticosteriod injections and activity modification.  Onset of symptoms was gradual starting 1+ years ago with gradually worsening course since that time.The patient noted no past surgery on the right hip(s).  Patient currently rates pain in the right hip at 8 out of 10 with activity. Patient has night pain, worsening of pain with activity and weight bearing, trendelenberg gait, pain that interfers with activities of daily living and pain with passive range of motion. Patient has evidence of periarticular osteophytes and joint space narrowing by imaging studies. This condition presents safety issues increasing the risk of falls.  There is no current active infection.  Risks, benefits and expectations were discussed with the patient.  Risks including but not limited to the risk of anesthesia, blood clots, nerve damage, blood vessel damage, failure of the prosthesis, infection and up to and including death.  Patient understand the risks, benefits and expectations and wishes to proceed with surgery.   PCP: Rita Ohara, MD  D/C Plans:       Home   Post-op Meds:       No Rx given  Tranexamic Acid:      To be given - IV   Decadron:      Is to be given  FYI:      ASA  Norco  DME:   Pt already has equipment   PT:   [facility]  Pharmacy: [facility] -   [address], [place]   Patient Active Problem List   Diagnosis Date Noted  . Primary osteoarthritis of right knee 09/03/2017  . S/P knee replacement 09/03/2017  . Generalized anxiety disorder 02/04/2017  . Precordial pain 10/26/2013  . Hip arthritis  12/29/2012  . Allergic rhinitis, cause unspecified 08/26/2011  . Heme + stool 12/23/2010  . Pure hypercholesterolemia 11/26/2010   Past Medical History:  Diagnosis Date  . Abnormal TSH 2004   normal since  . Arthritis   . Basal cell cancer    nose; Dr. Allyson Sabal   . Diverticulosis 8/07  . Migraine childhood   resolved  . Pure hypercholesterolemia   . Radial styloid tenosynovitis (de quervain) 11/2018   injected by Dr. Fredna Dow 11/2018, 02/2019  . SCCA (squamous cell carcinoma) of skin 12/2015   right temple  . Squamous cell carcinoma of skin 02/15/2017   in situ-left jaw (CX35FU)    Past Surgical History:  Procedure Laterality Date  . BREAST BIOPSY Left 1984   benign  . left hip intra-articular steriod injection  03/13/2014  . TONSILLECTOMY  age 29  . TOTAL KNEE ARTHROPLASTY Left 07/2003   Dr. Theda Sers  . TOTAL KNEE ARTHROPLASTY Right 09/03/2017   Procedure: RIGHT TOTAL KNEE ARTHROPLASTY;  Surgeon: Sydnee Cabal, MD;  Location: [facility];  Service: Orthopedics;  Laterality: Right;    No current facility-administered medications for this encounter.   Current Outpatient Medications  Medication Sig Dispense Refill Last Dose  . atorvastatin (LIPITOR) 40 MG tablet Take 1 tablet (40 mg total) by mouth at bedtime. 90 tablet 3   . Biotin 10000 MCG TABS Take 10,000 mcg by mouth daily.     Marland Kitchen  bisacodyl (DULCOLAX) 5 MG EC tablet Take 5-10 mg by mouth at bedtime as needed for moderate constipation.     . Calcium Carbonate-Vitamin D (CALTRATE 600+D) 600-400 MG-UNIT per tablet Take 1 tablet by mouth daily at 3 pm.      . diclofenac Sodium (VOLTAREN) 1 % GEL Apply 1 application topically 2 (two) times daily as needed (knee pain.).      Marland Kitchen ibuprofen (ADVIL) 200 MG tablet Take 200-400 mg by mouth every 6 (six) hours as needed (headaches/pain.).      Marland Kitchen Multiple Vitamin (MULTIVITAMIN WITH MINERALS) TABS tablet Take 1 tablet by mouth daily. CENTRUM MULTIVITAMIN     . sulfamethoxazole-trimethoprim (BACTRIM  DS) 800-160 MG tablet Take 1 tablet by mouth 2 (two) times daily. 20 tablet 0 Not Taking at Unknown time   No Known Allergies   Social History   Tobacco Use  . Smoking status: Never Smoker  . Smokeless tobacco: Never Used  Substance Use Topics  . Alcohol use: Yes    Comment: occasional glass of wine.    Family History  Problem Relation Age of Onset  . Cancer Sister        bone cancer L leg with mets to lung, diagnosed age 26; s/p amputation  . Hyperlipidemia Brother   . Heart disease Brother 42       CABG  . Cancer Sister 29       breast cancer and lung cancer (lung dx'd first)  . Rheum arthritis Daughter   . Gout Son      Review of Systems  Constitutional: Negative.   HENT: Negative.   Eyes: Negative.   Respiratory: Negative.   Cardiovascular: Negative.   Gastrointestinal: Negative.   Genitourinary: Positive for frequency.  Musculoskeletal: Positive for joint pain.  Skin: Negative.   Neurological: Positive for dizziness.  Endo/Heme/Allergies: Positive for environmental allergies.  Psychiatric/Behavioral: Negative.      Objective:  Physical Exam  Constitutional: She is oriented to person, place, and time. She appears well-developed.  HENT:  Head: Normocephalic.  Eyes: Pupils are equal, round, and reactive to light.  Neck: No JVD present. No tracheal deviation present. No thyromegaly present.  Cardiovascular: Normal rate, regular rhythm and intact distal pulses.  Respiratory: Effort normal and breath sounds normal. No respiratory distress. She has no wheezes.  GI: Soft. There is no abdominal tenderness. There is no guarding.  Musculoskeletal:     Cervical back: Neck supple.     Right hip: Tenderness and bony tenderness present. No swelling, deformity or lacerations. Decreased range of motion. Decreased strength.  Lymphadenopathy:    She has no cervical adenopathy.  Neurological: She is alert and oriented to person, place, and time.  Skin: Skin is warm and  dry.  Psychiatric: She has a normal mood and affect.     Labs:  Estimated body mass index is 25.43 kg/m as calculated from the following:   Height as of 08/03/19: 4' 10.5" (1.486 m).   Weight as of 08/03/19: 56.2 kg.   Imaging Review Plain radiographs demonstrate severe degenerative joint disease of the right hip. The bone quality appears to be good for age and reported activity level.      Assessment/Plan:  End stage arthritis, right hip  The patient history, physical examination, clinical judgement of the provider and imaging studies are consistent with end stage degenerative joint disease of the right hip and total hip arthroplasty is deemed medically necessary. The treatment options including medical management, injection therapy, arthroscopy and  arthroplasty were discussed at length. The risks and benefits of total hip arthroplasty were presented and reviewed. The risks due to aseptic loosening, infection, stiffness, dislocation/subluxation,  thromboembolic complications and other imponderables were discussed.  The patient acknowledged the explanation, agreed to proceed with the plan and consent was signed. Patient is being admitted for treatment for surgery, pain control, PT, OT, prophylactic antibiotics, VTE prophylaxis, progressive ambulation and ADL's and discharge planning.The patient is planning to be discharged home.    [place] Pugh Akina Maish   PA-C  08/17/2019, 3:16 PM

## 2019-08-21 ENCOUNTER — Other Ambulatory Visit: Payer: Self-pay

## 2019-08-21 ENCOUNTER — Ambulatory Visit (INDEPENDENT_AMBULATORY_CARE_PROVIDER_SITE_OTHER): Payer: Medicare Other | Admitting: Family Medicine

## 2019-08-21 ENCOUNTER — Encounter: Payer: Self-pay | Admitting: Family Medicine

## 2019-08-21 VITALS — BP 130/70 | HR 72 | Ht 58.5 in | Wt 125.6 lb

## 2019-08-21 DIAGNOSIS — R9431 Abnormal electrocardiogram [ECG] [EKG]: Secondary | ICD-10-CM | POA: Diagnosis not present

## 2019-08-21 DIAGNOSIS — R42 Dizziness and giddiness: Secondary | ICD-10-CM

## 2019-08-21 DIAGNOSIS — R002 Palpitations: Secondary | ICD-10-CM

## 2019-08-21 DIAGNOSIS — I4589 Other specified conduction disorders: Secondary | ICD-10-CM | POA: Diagnosis not present

## 2019-08-21 LAB — COMPREHENSIVE METABOLIC PANEL
ALT: 19 IU/L (ref 0–32)
AST: 28 IU/L (ref 0–40)
Albumin/Globulin Ratio: 2.4 — ABNORMAL HIGH (ref 1.2–2.2)
Albumin: 4.1 g/dL (ref 3.6–4.6)
Alkaline Phosphatase: 69 IU/L (ref 48–121)
BUN/Creatinine Ratio: 22 (ref 12–28)
BUN: 18 mg/dL (ref 8–27)
Bilirubin Total: 0.6 mg/dL (ref 0.0–1.2)
CO2: 31 mmol/L — ABNORMAL HIGH (ref 20–29)
Calcium: 9.5 mg/dL (ref 8.7–10.3)
Chloride: 102 mmol/L (ref 96–106)
Creatinine, Ser: 0.82 mg/dL (ref 0.57–1.00)
GFR calc Af Amer: 77 mL/min/{1.73_m2} (ref >59)
GFR calc non Af Amer: 66 mL/min/{1.73_m2} (ref >59)
Globulin, Total: 1.7 g/dL (ref 1.5–4.5)
Glucose: 88 mg/dL (ref 65–99)
Potassium: 4.9 mmol/L (ref 3.5–5.2)
Sodium: 140 mmol/L (ref 134–144)
Total Protein: 5.8 g/dL — ABNORMAL LOW (ref 6.0–8.5)

## 2019-08-21 LAB — CBC WITH DIFFERENTIAL/PLATELET
Basophils Absolute: 0 10*3/uL (ref 0.0–0.2)
Basos: 1 %
EOS (ABSOLUTE): 0.1 10*3/uL (ref 0.0–0.4)
Eos: 2 %
Hematocrit: 43 % (ref 34.0–46.6)
Hemoglobin: 14.5 g/dL (ref 11.1–15.9)
Lymphocytes Absolute: 1.7 10*3/uL (ref 0.7–3.1)
Lymphs: 26 %
MCH: 30.3 pg (ref 26.6–33.0)
MCHC: 33.7 g/dL (ref 31.5–35.7)
MCV: 90 fL (ref 79–97)
Monocytes Absolute: 0.9 10*3/uL (ref 0.1–0.9)
Monocytes: 13 %
Neutrophils Absolute: 3.9 10*3/uL (ref 1.4–7.0)
Neutrophils: 58 %
Platelets: 280 10*3/uL (ref 150–450)
RBC: 4.78 x10E6/uL (ref 3.77–5.28)
RDW: 14 % (ref 11.7–15.4)
WBC: 6.6 10*3/uL (ref 3.4–10.8)

## 2019-08-21 LAB — MAGNESIUM: Magnesium: 2.1 mg/dL (ref 1.6–2.3)

## 2019-08-21 NOTE — Progress Notes (Signed)
PCP - Rita Ohara clearance 08-03-19 epic Cardiologist - Appt. With Oswaldo Milian 08-24-19 new patient  Chest x-ray -  EKG - 08-18-19 epic Stress Test - 07-05-17 epic ECHO -  Cardiac Cath -  CBC, CMP 08-21-19 Epic  Sleep Study -  CPAP -   Fasting Blood Sugar -  Checks Blood Sugar _____ times a day  Blood Thinner Instructions: Aspirin Instructions: Last Dose:  Anesthesia review: Cardiology appt. 08-24-19  Patient denies shortness of breath, fever, cough and chest pain at PAT appointment   none  Patient verbalized understanding of instructions that were given to them at the PAT appointment. Patient was also instructed that they will need to review over the PAT instructions again at home before surgery.

## 2019-08-21 NOTE — Patient Instructions (Addendum)
DUE TO COVID-19 ONLY ONE VISITOR IS ALLOWED TO COME WITH YOU AND STAY IN THE WAITING ROOM ONLY DURING PRE OP AND PROCEDURE DAY OF SURGERY. TWo VISITOR MAY VISIT WITH YOU AFTER SURGERY IN YOUR PRIVATE ROOM DURING VISITING HOURS ONLY! 10a-8p  YOU NEED TO HAVE A COVID 19 TEST ON_5-28-21 _____ @__0920  am _____, THIS TEST MUST BE DONE BEFORE SURGERY, COME  [address], [place] , [address].  ([place]) ONCE YOUR COVID TEST IS COMPLETED, PLEASE BEGIN THE QUARANTINE INSTRUCTIONS AS OUTLINED IN YOUR HANDOUT.                Branigan Haneline Rozelle  08/21/2019   Your procedure is scheduled on: 08-29-19   Report to [facility] Main  Entrance   Report to admitting at     0900 AM     Call this number if you have problems the morning of surgery [phone]    Remember: NO SOLID FOOD AFTER MIDNIGHT THE NIGHT PRIOR TO SURGERY. NOTHING BY MOUTH EXCEPT CLEAR LIQUIDS UNTIL    0830 am  . PLEASE FINISH ENSURE DRINK PER SURGEON ORDER  WHICH NEEDS TO BE COMPLETED AT     0830 am then nothing by mouth.    CLEAR LIQUID DIET   Foods Allowed                                                                                Foods Excluded  Coffee and tea, regular and decaf  NO CREAMER                           liquids that you cannot  Plain Jell-O any favor except red or purple                                           see through such as: Fruit ices (not with fruit pulp)                                                     milk, soups, orange juice  Iced Popsicles                                                     All solid food Carbonated beverages, regular and diet                                    Cranberry, grape and apple juices Sports drinks like Gatorade Lightly seasoned clear broth or consume(fat free) Sugar, honey syrup  _____________________________________________________________________    BRUSH YOUR TEETH MORNING OF SURGERY AND RINSE YOUR MOUTH OUT, NO CHEWING GUM  CANDY OR  MINTS.     Take these medicines the morning of surgery with A SIP OF WATER: NONE                                 You may not have any metal on your body including hair pins and              piercings  Do not wear jewelry, make-up, lotions, powders or perfumes, deodorant             Do not wear nail polish on your fingernails.  Do not shave  48 hours prior to surgery.     Do not bring valuables to the hospital. [place].  Contacts, dentures or bridgework may not be worn into surgery.    Patients discharged the day of surgery will not be allowed to drive home. IF YOU ARE HAVING SURGERY AND GOING HOME THE SAME DAY, YOU MUST HAVE AN ADULT TO DRIVE YOU HOME AND BE WITH YOU FOR 24 HOURS. YOU MAY GO HOME BY TAXI OR UBER OR ORTHERWISE, BUT AN ADULT MUST ACCOMPANY YOU HOME AND STAY WITH YOU FOR 24 HOURS.  Name and phone number of your driver:  Special Instructions: N/A              Please read over the following fact sheets you were given: _____________________________________________________________________             [facility] - Preparing for Surgery Before surgery, you can play an important role.  Because skin is not sterile, your skin needs to be as free of germs as possible.  You can reduce the number of germs on your skin by washing with CHG (chlorahexidine gluconate) soap before surgery.  CHG is an antiseptic cleaner which kills germs and bonds with the skin to continue killing germs even after washing. Please DO NOT use if you have an allergy to CHG or antibacterial soaps.  If your skin becomes reddened/irritated stop using the CHG and inform your nurse when you arrive at Short Stay. Do not shave (including legs and underarms) for at least 48 hours prior to the first CHG shower.  You may shave your face/neck. Please follow these instructions carefully:  1.  Shower with CHG Soap the night before surgery and the  morning of  Surgery.  2.  If you choose to wash your hair, wash your hair first as usual with your  normal  shampoo.  3.  After you shampoo, rinse your hair and body thoroughly to remove the  shampoo.                           4.  Use CHG as you would any other liquid soap.  You can apply chg directly  to the skin and wash                       Gently with a scrungie or clean washcloth.  5.  Apply the CHG Soap to your body ONLY FROM THE NECK DOWN.   Do not use on face/ open                           Wound or open sores. Avoid  contact with eyes, ears mouth and genitals (private parts).                       Wash face,  Genitals (private parts) with your normal soap.             6.  Wash thoroughly, paying special attention to the area where your surgery  will be performed.  7.  Thoroughly rinse your body with warm water from the neck down.  8.  DO NOT shower/wash with your normal soap after using and rinsing off  the CHG Soap.                9.  Pat yourself dry with a clean towel.            10.  Wear clean pajamas.            11.  Place clean sheets on your bed the night of your first shower and do not  sleep with pets. Day of Surgery : Do not apply any lotions/deodorants the morning of surgery.  Please wear clean clothes to the hospital/surgery center.  FAILURE TO FOLLOW THESE INSTRUCTIONS MAY RESULT IN THE CANCELLATION OF YOUR SURGERY PATIENT SIGNATURE_________________________________  NURSE SIGNATURE__________________________________  ________________________________________________________________________   Adam Phenix  An incentive spirometer is a tool that can help keep your lungs clear and active. This tool measures how well you are filling your lungs with each breath. Taking long deep breaths may help reverse or decrease the chance of developing breathing (pulmonary) problems (especially infection) following:  A long period of time when you are unable to move or be active. BEFORE  THE PROCEDURE   If the spirometer includes an indicator to show your best effort, your nurse or respiratory therapist will set it to a desired goal.  If possible, sit up straight or lean slightly forward. Try not to slouch.  Hold the incentive spirometer in an upright position. INSTRUCTIONS FOR USE  1. Sit on the edge of your bed if possible, or sit up as far as you can in bed or on a chair. 2. Hold the incentive spirometer in an upright position. 3. Breathe out normally. 4. Place the mouthpiece in your mouth and seal your lips tightly around it. 5. Breathe in slowly and as deeply as possible, raising the piston or the ball toward the top of the column. 6. Hold your breath for 3-5 seconds or for as long as possible. Allow the piston or ball to fall to the bottom of the column. 7. Remove the mouthpiece from your mouth and breathe out normally. 8. Rest for a few seconds and repeat Steps 1 through 7 at least 10 times every 1-2 hours when you are awake. Take your time and take a few normal breaths between deep breaths. 9. The spirometer may include an indicator to show your best effort. Use the indicator as a goal to work toward during each repetition. 10. After each set of 10 deep breaths, practice coughing to be sure your lungs are clear. If you have an incision (the cut made at the time of surgery), support your incision when coughing by placing a pillow or rolled up towels firmly against it. Once you are able to get out of bed, walk around indoors and cough well. You may stop using the incentive spirometer when instructed by your caregiver.  RISKS AND COMPLICATIONS  Take your time so you do not get dizzy or light-headed.  If  you are in pain, you may need to take or ask for pain medication before doing incentive spirometry. It is harder to take a deep breath if you are having pain. AFTER USE  Rest and breathe slowly and easily.  It can be helpful to keep track of a log of your progress.  Your caregiver can provide you with a simple table to help with this. If you are using the spirometer at home, follow these instructions: [place] IF:   You are having difficultly using the spirometer.  You have trouble using the spirometer as often as instructed.  Your pain medication is not giving enough relief while using the spirometer.  You develop fever of 100.5 F (38.1 C) or higher. SEEK IMMEDIATE MEDICAL CARE IF:   You cough up bloody sputum that had not been present before.  You develop fever of 102 F (38.9 C) or greater.  You develop worsening pain at or near the incision site. MAKE SURE YOU:   Understand these instructions.  Will watch your condition.  Will get help right away if you are not doing well or get worse. Document Released: 07/27/2006 Document Revised: 06/08/2011 Document Reviewed: 09/27/2006 Exit[employer] Patient Information 2014 [employer], [place].   ________________________________________________________________________  WHAT IS A BLOOD TRANSFUSION? Blood Transfusion Information  A transfusion is the replacement of blood or some of its parts. Blood is made up of multiple cells which provide different functions.  Red blood cells carry oxygen and are used for blood loss replacement.  White blood cells fight against infection.  Platelets control bleeding.  Plasma helps clot blood.  Other blood products are available for specialized needs, such as hemophilia or other clotting disorders. BEFORE THE TRANSFUSION  Who gives blood for transfusions?   Healthy volunteers who are fully evaluated to make sure their blood is safe. This is blood bank blood. Transfusion therapy is the safest it has ever been in the practice of medicine. Before blood is taken from a donor, a complete history is taken to make sure that person has no history of diseases nor engages in risky social behavior (examples are intravenous drug use or sexual activity with multiple  partners). The donor's travel history is screened to minimize risk of transmitting infections, such as malaria. The donated blood is tested for signs of infectious diseases, such as HIV and hepatitis. The blood is then tested to be sure it is compatible with you in order to minimize the chance of a transfusion reaction. If you or a relative donates blood, this is often done in anticipation of surgery and is not appropriate for emergency situations. It takes many days to process the donated blood. RISKS AND COMPLICATIONS Although transfusion therapy is very safe and saves many lives, the main dangers of transfusion include:   Getting an infectious disease.  Developing a transfusion reaction. This is an allergic reaction to something in the blood you were given. Every precaution is taken to prevent this. The decision to have a blood transfusion has been considered carefully by your caregiver before blood is given. Blood is not given unless the benefits outweigh the risks. AFTER THE TRANSFUSION  Right after receiving a blood transfusion, you will usually feel much better and more energetic. This is especially true if your red blood cells have gotten low (anemic). The transfusion raises the level of the red blood cells which carry oxygen, and this usually causes an energy increase.  The nurse administering the transfusion will monitor you carefully for complications.  HOME CARE INSTRUCTIONS  No special instructions are needed after a transfusion. You may find your energy is better. Speak with your caregiver about any limitations on activity for underlying diseases you may have. SEEK MEDICAL CARE IF:   Your condition is not improving after your transfusion.  You develop redness or irritation at the intravenous (IV) site. SEEK IMMEDIATE MEDICAL CARE IF:  Any of the following symptoms occur over the next 12 hours:  Shaking chills.  You have a temperature by mouth above 102 F (38.9 C), not  controlled by medicine.  Chest, back, or muscle pain.  People around you feel you are not acting correctly or are confused.  Shortness of breath or difficulty breathing.  Dizziness and fainting.  You get a rash or develop hives.  You have a decrease in urine output.  Your urine turns a dark color or changes to pink, red, or brown. Any of the following symptoms occur over the next 10 days:  You have a temperature by mouth above 102 F (38.9 C), not controlled by medicine.  Shortness of breath.  Weakness after normal activity.  The white part of the eye turns yellow (jaundice).  You have a decrease in the amount of urine or are urinating less often.  Your urine turns a dark color or changes to pink, red, or brown. Document Released: 03/13/2000 Document Revised: 06/08/2011 Document Reviewed: 10/31/2007 [facility] Patient Information 2014 [place], [place].  _______________________________________________________________________

## 2019-08-21 NOTE — Progress Notes (Signed)
Chief Complaint  Patient presents with  . Dizziness    that comes and goes-she thinks it's related to her surgery. She is very nervous about it. Also, I sent you a message about her ASA last week, she is not taking and she wanted to make sure that was ok.    Patient presents with complaint of dizzy spells since last Thursday.  She describes the dizziness as lightheadedness, not vertigo.  It lasts just a few seconds.  Last night she felt it while eating supper.  Never happens when driving.  Not with sitting/standing or position changes.  Once happened while walking.  Doesn't seem exertional.   She is trying to drink more water.  She has had some palpitations with the dizziness, also short-lived.  Denies any chest pain or shortness of breath. Not checking BP at home.  She is going to see her daughter today who has a BP monitor. She is scheduled for video visit and for pre-op labs to be done tomorrow for her upcoming hip replacement, scheduled for 6/1 with Dr. Alvan Dame.  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  She hasn't been taking her aspirin.  Normal nuclear stress test 06/2017, no significant lesions on carotid US.  She has hyperlipidemia. She reports she was told by her doctor in [place] that she didn't need to take ASA (we had recommended at [facility]).  Took it for 6 months only.   PMH, PSH, SH reviewed  Outpatient Encounter Medications as of 08/21/2019  Medication Sig Note  . atorvastatin (LIPITOR) 40 MG tablet Take 1 tablet (40 mg total) by mouth at bedtime.   . Biotin 10000 MCG TABS Take 10,000 mcg by mouth daily.   . bisacodyl (DULCOLAX) 5 MG EC tablet Take 5-10 mg by mouth at bedtime as needed for moderate constipation.   . Calcium Carbonate-Vitamin D (CALTRATE 600+D)  600-400 MG-UNIT per tablet Take 1 tablet by mouth daily at 3 pm.    . diclofenac Sodium (VOLTAREN) 1 % GEL Apply 1 application topically 2 (two) times daily as needed (knee pain.).  08/21/2019: 3 times a week  . Multiple Vitamin (MULTIVITAMIN WITH MINERALS) TABS tablet Take 1 tablet by mouth daily. CENTRUM MULTIVITAMIN   . ibuprofen (ADVIL) 200 MG tablet Take 200-400 mg by mouth every 6 (six) hours as needed (headaches/pain.).    . [DISCONTINUED] sulfamethoxazole-trimethoprim (BACTRIM DS) 800-160 MG tablet Take 1 tablet by mouth 2 (two) times daily.    No facility-administered encounter medications on file as of 08/21/2019.    No Known Allergies  ROS: no fever, chills, URI symptoms, headaches.  No vertigo.  No nausea, vomiting, diarrhea. No rashes. No chest pain.  +palpitations and lightheadedness per HPI.  Denies dysuria, hematuria. Bleeding, bruising, rash.   PHYSICAL EXAM:  BP 130/70   Pulse 72   Ht 4' 10.5" (1.486 m)   Wt 125 lb 9.6 oz (57 kg)   BMI 25.80 kg/m   BP 140/82 on repeat by MD (and pulse in 80-90, somewhat irregular).  Wt Readings from Last 3 Encounters:  08/21/19 125 lb 9.6 oz (57 kg)  08/03/19 123 lb 12.8 oz (56.2 kg)  07/28/19 126 lb 3.2 oz (57.2 kg)   Elderly patient, seems anxious.  She is  alert and oriented and in no distress HEENT: conjunctiva and sclera are clear, EOMI.  Lips seem a little dry (wearing lipstick; her mask was falling down while talking) Neck: no lymphadenopathy, thyromegaly or carotid bruit Heart: somewhat irregular, with frequent ectopy vs irreg irreg Lungs: clear bilaterally Back: no spinal or CVA tenderness Abdomen: soft, nontender, no mass Extremities: no edema, normal pulses   EKG:  Rate 83, AV dissociation and accelerated junctional rhythm.   ASSESSMENT/PLAN:  Palpitations - abnormal rhythm on exam--EKG with AV dissociation, accelerated junctional rhythm. Rec cardiac eval prior to surgery - Plan: EKG 12-Lead, Magnesium,  Comprehensive metabolic panel, CBC with Differential/Platelet, Ambulatory referral to Cardiology  Episodic lightheadedness - Plan: EKG 12-Lead, CBC with Differential/Platelet  Abnormal finding on EKG - Plan: Ambulatory referral to Cardiology  Atrioventricular (AV) dissociation  c-met, CBC, Mg levels normal. Referred to cardiology.

## 2019-08-22 ENCOUNTER — Encounter (HOSPITAL_COMMUNITY)
Admission: RE | Admit: 2019-08-22 | Discharge: 2019-08-22 | Disposition: A | Discharge status: Home / Self Care | Payer: Medicare Other | Source: Ambulatory Visit | Attending: Orthopedic Surgery | Admitting: Orthopedic Surgery

## 2019-08-22 ENCOUNTER — Encounter (HOSPITAL_COMMUNITY): Payer: Self-pay

## 2019-08-22 ENCOUNTER — Other Ambulatory Visit: Payer: Self-pay

## 2019-08-22 DIAGNOSIS — Z01818 Encounter for other preprocedural examination: Secondary | ICD-10-CM | POA: Diagnosis not present

## 2019-08-22 DIAGNOSIS — Z96612 Presence of left artificial shoulder joint: Secondary | ICD-10-CM | POA: Insufficient documentation

## 2019-08-22 DIAGNOSIS — M1611 Unilateral primary osteoarthritis, right hip: Secondary | ICD-10-CM | POA: Diagnosis not present

## 2019-08-22 DIAGNOSIS — Z85828 Personal history of other malignant neoplasm of skin: Secondary | ICD-10-CM | POA: Insufficient documentation

## 2019-08-22 DIAGNOSIS — Z79899 Other long term (current) drug therapy: Secondary | ICD-10-CM | POA: Insufficient documentation

## 2019-08-22 DIAGNOSIS — Z96611 Presence of right artificial shoulder joint: Secondary | ICD-10-CM | POA: Insufficient documentation

## 2019-08-22 DIAGNOSIS — E7800 Pure hypercholesterolemia, unspecified: Secondary | ICD-10-CM | POA: Insufficient documentation

## 2019-08-22 HISTORY — DX: Pneumonia, unspecified organism: J18.9

## 2019-08-22 HISTORY — DX: Dizziness and giddiness: R42

## 2019-08-22 LAB — SURGICAL PCR SCREEN
MRSA, PCR: NEGATIVE
Staphylococcus aureus: NEGATIVE

## 2019-08-22 NOTE — Progress Notes (Signed)
Cardiology Office Note:    Date:  08/24/2019   ID:  Alexis Hines, DOB 1933/08/16, MRN [id number]  PCP:  Rita Ohara, MD  Cardiologist:  No primary care provider on file.  Electrophysiologist:  None   Referring MD: Rita Ohara, MD   Chief Complaint  Patient presents with  . Near Syncope    History of Present Illness:    Alexis Hines is a 86 y.o. female with a hx of basal cell cancer, squamous cell carcinoma of skin, hyperlipidemia who is referred by Dr. Tomi Bamberger for evaluation of EKG abnormalities and preop evaluation.  Plan for hip surgery on 08/29/2019.  Preoperative EKG done at Dr. Johnsie Kindred office 5/24 was concerning for AV dissociation and accelerated junctional rhythm.  On my review, appears to be sinus rhythm with frequent PACs.  She reports that she goes to exercise class three times per week.  Does mostly chair exercises.  States that she can walk up a flight of stairs without stopping.  She denies any chest pain or dyspnea.  She does report that she has been having lightheadedness and near syncopal episodes.  Started 1 week ago.  Can occur multiple times per day.  Also has palpitations during these episodes.  Lasts less than a minute.  She underwent a nuclear stress test in [place] on 07/05/2017, which showed normal EF, no perfusion defects.  Past Medical History:  Diagnosis Date  . Abnormal TSH 2004   normal since  . Arthritis   . Basal cell cancer    nose; Dr. Allyson Sabal   . Diverticulosis 8/07  . Dizziness   . Migraine childhood   resolved  . Pneumonia   . Pure hypercholesterolemia   . Radial styloid tenosynovitis (de quervain) 11/2018   injected by Dr. Fredna Dow 11/2018, 02/2019  . SCCA (squamous cell carcinoma) of skin 12/2015   right temple  . Squamous cell carcinoma of skin 02/15/2017   in situ-left jaw (CX35FU)    Past Surgical History:  Procedure Laterality Date  . BREAST BIOPSY Left 1984   benign  . EYE SURGERY     Bil and eyelids lifted  . left hip  intra-articular steriod injection  03/13/2014  . Mohs procedure    . TONSILLECTOMY  age 37  . TOTAL KNEE ARTHROPLASTY Left 07/2003   Dr. Theda Sers  . TOTAL KNEE ARTHROPLASTY Right 09/03/2017   Procedure: RIGHT TOTAL KNEE ARTHROPLASTY;  Surgeon: Sydnee Cabal, MD;  Location: [facility];  Service: Orthopedics;  Laterality: Right;    Current Medications: Current Meds  Medication Sig  . atorvastatin (LIPITOR) 40 MG tablet Take 1 tablet (40 mg total) by mouth at bedtime.  . Biotin 10000 MCG TABS Take 10,000 mcg by mouth daily.  . bisacodyl (DULCOLAX) 5 MG EC tablet Take 5-10 mg by mouth at bedtime as needed for moderate constipation.  . Calcium Carbonate-Vitamin D (CALTRATE 600+D) 600-400 MG-UNIT per tablet Take 1 tablet by mouth daily at 3 pm.   . diclofenac Sodium (VOLTAREN) 1 % GEL Apply 1 application topically 2 (two) times daily as needed (knee pain.).   Marland Kitchen ibuprofen (ADVIL) 200 MG tablet Take 200-400 mg by mouth every 6 (six) hours as needed (headaches/pain.).   Marland Kitchen Multiple Vitamin (MULTIVITAMIN WITH MINERALS) TABS tablet Take 1 tablet by mouth daily. CENTRUM MULTIVITAMIN     Allergies:   Patient has no known allergies.   Social History   Socioeconomic History  . Marital status: Widowed    Spouse name: Not on file  .  Number of children: 2  . Years of education: Not on file  . Highest education level: Not on file  Occupational History  . Occupation: Retired Psychologist, prison and probation services  Tobacco Use  . Smoking status: Never Smoker  . Smokeless tobacco: Never Used  Substance and Sexual Activity  . Alcohol use: Yes    Comment: occasional glass of wine.  . Drug use: No  . Sexual activity: Not Currently  Other Topics Concern  . Not on file  Social History Narrative   Widowed. Lives 1/2 the year in [place]. Her husband (retired Tax adviser) died from liver cancer in 04-24-11.  Daughter lives in [place]; 2 grandchildren here, and 2 in [place] Determinants of Health   Financial Resource  Strain:   . Difficulty of Paying Living Expenses:   Food Insecurity:   . Worried About Charity fundraiser in the Last Year:   . Arboriculturist in the Last Year:   Transportation Needs:   . Film/video editor (Medical):   Marland Kitchen Lack of Transportation (Non-Medical):   Physical Activity:   . Days of Exercise per Week:   . Minutes of Exercise per Session:   Stress:   . Feeling of Stress :   Social Connections:   . Frequency of Communication with Friends and Family:   . Frequency of Social Gatherings with Friends and Family:   . Attends Religious Services:   . Active Member of Clubs or Organizations:   . Attends Archivist Meetings:   Marland Kitchen Marital Status:      Family History: The patient's family history includes Cancer in her sister; Cancer (age of onset: 15) in her sister; Gout in her son; Heart disease (age of onset: 12) in her brother; Hyperlipidemia in her brother; Rheum arthritis in her daughter.  ROS:   Please see the history of present illness.     All other systems reviewed and are negative.  EKGs/Labs/Other Studies Reviewed:    The following studies were reviewed today:   EKG:  EKG is ordered today.  The ekg ordered today demonstrates sinus rhythm, frequent PACs with short run of SVT, occasional junctional beat  Recent Labs: 08/03/2019: TSH 0.821 08/21/2019: ALT 19; BUN 18; Creatinine, Ser 0.82; Hemoglobin 14.5; Magnesium 2.1; Platelets 280; Potassium 4.9; Sodium 140  Recent Lipid Panel    Component Value Date/Time   CHOL 181 01/03/2018 0920   TRIG 89 01/03/2018 0920   HDL 70 01/03/2018 0920   CHOLHDL 2.6 01/03/2018 0920   CHOLHDL 1.9 12/23/2016 0816   VLDL 26 12/26/2015 0706   LDLCALC 93 01/03/2018 0920   LDLCALC 65 12/23/2016 0816    Physical Exam:    VS:  BP 126/70   Pulse 74   Ht 4\' 11"  (1.499 m)   Wt 124 lb 12.8 oz (56.6 kg)   SpO2 97%   BMI 25.21 kg/m     Wt Readings from Last 3 Encounters:  08/24/19 124 lb 12.8 oz (56.6 kg)  08/22/19  122 lb 6.4 oz (55.5 kg)  08/22/19 124 lb (56.2 kg)     GEN:  in no acute distress HEENT: Normal NECK: No JVD; No carotid bruits LYMPHATICS: No lymphadenopathy CARDIAC: irregular, tachycardic, no murmurs RESPIRATORY:  Clear to auscultation without rales, wheezing or rhonchi  ABDOMEN: Soft, non-tender, non-distended MUSCULOSKELETAL:  No edema; No deformity  SKIN: Warm and dry NEUROLOGIC:  Alert and oriented x 3 PSYCHIATRIC:  Normal affect   ASSESSMENT:    1. Pre-operative  cardiovascular examination   2. Abnormal EKG   3. Lightheadedness   4. Near syncope   5. Palpitations    PLAN:    Preop evaluation: Prior to hip surgery.  She reports functional capacity greater than 4 METS and denies any chest pain/dyspnea.  In addition she had a negative nuclear stress test in [place] in 2019.  However, she is currently reporting lightheadedness/near syncope and having frequent PACs, with runs of SVT and occasional junctional beat on EKG.  Recommend TTE and evaluation with cardiac monitor prior to surgery.  Lightheadedness/near syncope/palpitations: Frequent PACs/runs of SVT on EKG.  Plan TTE and Zio patch x3 days as above  RTC in 1 month   Medication Adjustments/Labs and Tests Ordered: Current medicines are reviewed at length with the patient today.  Concerns regarding medicines are outlined above.  Orders Placed This Encounter  Procedures  . LONG TERM MONITOR (3-14 DAYS)  . EKG 12-Lead  . ECHOCARDIOGRAM COMPLETE   No orders of the defined types were placed in this encounter.   Patient Instructions  Medication Instructions:  Your physician recommends that you continue on your current medications as directed. Please refer to the Current Medication list given to you today.  Testing/Procedures: Your physician has requested that you have an echocardiogram. Echocardiography is a painless test that uses sound waves to create images of your heart. It provides your doctor with information  about the size and shape of your heart and how well your heart's chambers and valves are working. This procedure takes approximately one hour. There are no restrictions for this procedure.   ZIO XT- Long Term Monitor Instructions   Your physician has requested you wear your ZIO patch monitor 3 days.   This is a single patch monitor.  Irhythm supplies one patch monitor per enrollment.  Additional stickers are not available.   Please do not apply patch if you will be having a Nuclear Stress Test, Echocardiogram, Cardiac CT, MRI, or Chest Xray during the time frame you would be wearing the monitor. The patch cannot be worn during these tests.  You cannot remove and re-apply the ZIO XT patch monitor.   Your ZIO patch monitor will be sent USPS Priority mail from [facility] directly to your home address. The monitor may also be mailed to a PO BOX if home delivery is not available.   It may take 3-5 days to receive your monitor after you have been enrolled.   Once you have received you monitor, please review enclosed instructions.  Your monitor has already been registered assigning a specific monitor serial # to you.   Applying the monitor   Shave hair from upper left chest.   Hold abrader disc by orange tab.  Rub abrader in 40 strokes over left upper chest as indicated in your monitor instructions.   Clean area with 4 enclosed alcohol pads .  Use all pads to assure are is cleaned thoroughly.  Let dry.   Apply patch as indicated in monitor instructions.  Patch will be place under collarbone on left side of chest with arrow pointing upward.   Rub patch adhesive wings for 2 minutes.Remove white label marked "1".  Remove white label marked "2".  Rub patch adhesive wings for 2 additional minutes.   While looking in a mirror, press and release button in center of patch.  A small green light will flash 3-4 times .  This will be your only indicator the monitor has been turned on.  Do not  shower for the first 24 hours.  You may shower after the first 24 hours.   Press button if you feel a symptom. You will hear a small click.  Record Date, Time and Symptom in the Patient Log Book.   When you are ready to remove patch, follow instructions on last 2 pages of Patient Log Book.  Stick patch monitor onto last page of Patient Log Book.   Place Patient Log Book in [place] box.  Use locking tab on box and tape box closed securely.  The [employer] and [employer] has [employer] on it.  Please place in mailbox as soon as possible.  Your physician should have your test results approximately 7 days after the monitor has been mailed back to [facility].   Call [place] at [phone] if you have questions regarding your ZIO XT patch monitor.  Call them immediately if you see an orange light blinking on your monitor.   If your monitor falls off in less than 4 days contact our Monitor department at [phone].  If your monitor becomes loose or falls off after 4 days call [employer] at [phone] for suggestions on securing your monitor.    Follow-Up: At [facility], you and your health needs are our priority.  As part of our continuing mission to provide you with exceptional heart care, we have created designated Provider Care Teams.  These Care Teams include your primary Cardiologist (physician) and Advanced Practice Providers (APPs -  Physician Assistants and Nurse Practitioners) who all work together to provide you with the care you need, when you need it.  We recommend signing up for the patient portal called "MyChart".  Sign up information is provided on this After Visit Summary.  MyChart is used to connect with patients for Virtual Visits (Telemedicine).  Patients are able to view lab/test results, encounter notes, upcoming appointments, etc.  Non-urgent messages can be sent to your provider as well.   To learn more about what you can do with MyChart, go to  [email or web address].    Your next appointment:   1 month(s)  The format for your next appointment:   In Person  Provider:   Oswaldo Milian, MD         Signed, Donato Heinz, MD  08/24/2019 8:50 AM    [place]

## 2019-08-24 ENCOUNTER — Other Ambulatory Visit: Payer: Self-pay

## 2019-08-24 ENCOUNTER — Ambulatory Visit: Payer: Medicare Other | Admitting: Cardiology

## 2019-08-24 ENCOUNTER — Telehealth: Payer: Self-pay | Admitting: Radiology

## 2019-08-24 ENCOUNTER — Encounter: Payer: Self-pay | Admitting: Cardiology

## 2019-08-24 VITALS — BP 126/70 | HR 74 | Ht 59.0 in | Wt 124.8 lb

## 2019-08-24 DIAGNOSIS — Z01810 Encounter for preprocedural cardiovascular examination: Secondary | ICD-10-CM

## 2019-08-24 DIAGNOSIS — R002 Palpitations: Secondary | ICD-10-CM

## 2019-08-24 DIAGNOSIS — R9431 Abnormal electrocardiogram [ECG] [EKG]: Secondary | ICD-10-CM | POA: Diagnosis not present

## 2019-08-24 DIAGNOSIS — R42 Dizziness and giddiness: Secondary | ICD-10-CM

## 2019-08-24 DIAGNOSIS — R55 Syncope and collapse: Secondary | ICD-10-CM

## 2019-08-24 NOTE — Telephone Encounter (Signed)
Enrolled patient for a 3 day Zio monitor to be mailed to patients home.  

## 2019-08-24 NOTE — Patient Instructions (Signed)
Medication Instructions:  Your physician recommends that you continue on your current medications as directed. Please refer to the Current Medication list given to you today.  Testing/Procedures: Your physician has requested that you have an echocardiogram. Echocardiography is a painless test that uses sound waves to create images of your heart. It provides your doctor with information about the size and shape of your heart and how well your heart's chambers and valves are working. This procedure takes approximately one hour. There are no restrictions for this procedure.   ZIO XT- Long Term Monitor Instructions   Your physician has requested you wear your ZIO patch monitor 3 days.   This is a single patch monitor.  Irhythm supplies one patch monitor per enrollment.  Additional stickers are not available.   Please do not apply patch if you will be having a Nuclear Stress Test, Echocardiogram, Cardiac CT, MRI, or Chest Xray during the time frame you would be wearing the monitor. The patch cannot be worn during these tests.  You cannot remove and re-apply the ZIO XT patch monitor.   Your ZIO patch monitor will be sent USPS Priority mail from [facility] directly to your home address. The monitor may also be mailed to a PO BOX if home delivery is not available.   It may take 3-5 days to receive your monitor after you have been enrolled.   Once you have received you monitor, please review enclosed instructions.  Your monitor has already been registered assigning a specific monitor serial # to you.   Applying the monitor   Shave hair from upper left chest.   Hold abrader disc by orange tab.  Rub abrader in 40 strokes over left upper chest as indicated in your monitor instructions.   Clean area with 4 enclosed alcohol pads .  Use all pads to assure are is cleaned thoroughly.  Let dry.   Apply patch as indicated in monitor instructions.  Patch will be place under collarbone on left side  of chest with arrow pointing upward.   Rub patch adhesive wings for 2 minutes.Remove white label marked "1".  Remove white label marked "2".  Rub patch adhesive wings for 2 additional minutes.   While looking in a mirror, press and release button in center of patch.  A small green light will flash 3-4 times .  This will be your only indicator the monitor has been turned on.     Do not shower for the first 24 hours.  You may shower after the first 24 hours.   Press button if you feel a symptom. You will hear a small click.  Record Date, Time and Symptom in the Patient Log Book.   When you are ready to remove patch, follow instructions on last 2 pages of Patient Log Book.  Stick patch monitor onto last page of Patient Log Book.   Place Patient Log Book in [place] box.  Use locking tab on box and tape box closed securely.  The [employer] has [employer] on it.  Please place in mailbox as soon as possible.  Your physician should have your test results approximately 7 days after the monitor has been mailed back to [facility].   Call [place] at [phone] if you have questions regarding your ZIO XT patch monitor.  Call them immediately if you see an orange light blinking on your monitor.   If your monitor falls off in less than 4 days contact our  Monitor department at [phone].  If your monitor becomes loose or falls off after 4 days call [employer] at [phone] for suggestions on securing your monitor.    Follow-Up: At [facility], you and your health needs are our priority.  As part of our continuing mission to provide you with exceptional heart care, we have created designated Provider Care Teams.  These Care Teams include your primary Cardiologist (physician) and Advanced Practice Providers (APPs -  Physician Assistants and Nurse Practitioners) who all work together to provide you with the care you need, when you need it.  We recommend signing up  for the patient portal called "MyChart".  Sign up information is provided on this After Visit Summary.  MyChart is used to connect with patients for Virtual Visits (Telemedicine).  Patients are able to view lab/test results, encounter notes, upcoming appointments, etc.  Non-urgent messages can be sent to your provider as well.   To learn more about what you can do with MyChart, go to [email or web address].    Your next appointment:   1 month(s)  The format for your next appointment:   In Person  Provider:   Oswaldo Milian, MD

## 2019-08-25 ENCOUNTER — Inpatient Hospital Stay (HOSPITAL_COMMUNITY): Admission: RE | Admit: 2019-08-25 | Payer: Medicare Other | Source: Ambulatory Visit

## 2019-08-25 ENCOUNTER — Telehealth: Payer: Self-pay | Admitting: Cardiology

## 2019-08-25 ENCOUNTER — Encounter (HOSPITAL_COMMUNITY): Payer: Self-pay | Admitting: Physician Assistant

## 2019-08-25 NOTE — Progress Notes (Signed)
Anesthesia Chart Review   Case: [id number] Date/Time: 08/29/19 1115   Procedure: TOTAL HIP ARTHROPLASTY ANTERIOR APPROACH (Right Hip) - 70 mins   Anesthesia type: Spinal   Pre-op diagnosis: Right hip osteoarthritis   [facility] / [facility]   Surgeons: Paralee Cancel, MD      DISCUSSION:86 y.o. never smoker with h/o right hip oa scheduled for above procedure 08/29/2019 with Dr. Paralee Cancel.   Pt seen by cardiologist, Dr. Oswaldo Milian, 08/24/2019.  Pt was referred by PCP due to EKG abnormalities at preop evaluation.  Per OV note EKG sinus rhythm with frequent PACs.  Pt goes to exercise class three days a week (mostly chair exercises) and can go up a flight a steps without stopping.  Pt did complain of lightheadedness and near syncopal episodes that started about 1 week ago with concurrent palpitations.  Per Dr. Gardiner Rhyme, "Preop evaluation: Prior to hip surgery.  She reports functional capacity greater than 4 METS and denies any chest pain/dyspnea.  In addition she had a negative nuclear stress test in [place] in 2019.  However, she is currently reporting lightheadedness/near syncope and having frequent PACs, with runs of SVT and occasional junctional beat on EKG.  Recommend TTE and evaluation with cardiac monitor prior to surgery."  Zio monitor ordered, Echo ordered.    Dr. Aurea Graff office informed.  Surgery will be rescheduled.  VS: BP (!) 151/56 (BP Location: Left Arm)   Pulse 68   Temp 36.7 C (Oral)   Resp 16   Ht 4\' 11"  (1.499 m)   Wt 55.5 kg   SpO2 98%   BMI 24.72 kg/m   PROVIDERS: Rita Ohara, MD is PCP   Oswaldo Milian, MD is Cardiologist  LABS: Labs reviewed: Acceptable for surgery. (all labs ordered are listed, but only abnormal results are displayed)  Labs Reviewed  SURGICAL PCR SCREEN  TYPE AND SCREEN     IMAGES:   EKG: 08/21/2019 Rate 83 bpm  Sinus rhythm with AV dissociated and accelerated Junctional rhythm  Low voltage QRS  CV: Myocardial  Perfusion 07/05/2017 Impressions:  Negative Lexiscan stress test secondary to:  1. No chest discomfort or significant EKG changes suggestive of ischemia during Lexiscan stress test.  2. No Lexiscan induced complex ventricular arrythmias.  Past Medical History:  Diagnosis Date  . Abnormal TSH 2004   normal since  . Arthritis   . Basal cell cancer    nose; Dr. Allyson Sabal   . Diverticulosis 8/07  . Dizziness   . Migraine childhood   resolved  . Pneumonia   . Pure hypercholesterolemia   . Radial styloid tenosynovitis (de quervain) 11/2018   injected by Dr. Fredna Dow 11/2018, 02/2019  . SCCA (squamous cell carcinoma) of skin 12/2015   right temple  . Squamous cell carcinoma of skin 02/15/2017   in situ-left jaw (CX35FU)    Past Surgical History:  Procedure Laterality Date  . BREAST BIOPSY Left 1984   benign  . EYE SURGERY     Bil and eyelids lifted  . left hip intra-articular steriod injection  03/13/2014  . Mohs procedure    . TONSILLECTOMY  age 57  . TOTAL KNEE ARTHROPLASTY Left 07/2003   Dr. Theda Sers  . TOTAL KNEE ARTHROPLASTY Right 09/03/2017   Procedure: RIGHT TOTAL KNEE ARTHROPLASTY;  Surgeon: Sydnee Cabal, MD;  Location[facility];  Service: Orthopedics;  Laterality: Right;    MEDICATIONS: . atorvastatin (LIPITOR) 40 MG tablet  . Biotin 10000 MCG TABS  . bisacodyl (DULCOLAX)  5 MG EC tablet  . Calcium Carbonate-Vitamin D (CALTRATE 600+D) 600-400 MG-UNIT per tablet  . diclofenac Sodium (VOLTAREN) 1 % GEL  . ibuprofen (ADVIL) 200 MG tablet  . Multiple Vitamin (MULTIVITAMIN WITH MINERALS) TABS tablet   No current facility-administered medications for this encounter.    Maia Plan [facility] Pre[facility]Surgical Testing [phone] 08/25/19  10:40 AM

## 2019-08-25 NOTE — Telephone Encounter (Signed)
Spoke with pt who report prior to office visit, she only experienced dizzy spells maybe 3- 4 times a day but yesterday had them about every hour. Pt state her son who is a physician recommended she seek ER care for further evaluations but declined. Pt unable to provide HR and BP and state she doesn't currently own a machine. Pt did eport she slept well last night and hasn't experienced symptoms today as pf yet, but wanted to make MD aware.  Nurse advised pt to purchase a BP to keep a log when she's having symptoms but would forward message to MD for any further recommendations. Nurse also advised to report to ER if she develop symptoms of feeling like she's going to pass out. Pt verbalized understanding.

## 2019-08-25 NOTE — Telephone Encounter (Signed)
Left message to call back  

## 2019-08-25 NOTE — Telephone Encounter (Signed)
Patient returning call.

## 2019-08-25 NOTE — Telephone Encounter (Signed)
Follow Up:   Pt is returning Alisha's call from this morning. She said please do not hang up until she answer please.

## 2019-08-25 NOTE — Telephone Encounter (Signed)
Spoke with patient.  She reports that she is feeling better, no episodes of dizziness today.  She purchased a BP monitor today, reports initial BP 118/78 with pulse 98.  Checked later in the day and was 150/83 with pulse 89.  She is asking if we can move up her echo, as it is not scheduled until 6/18.  Hayley, is any way to move it up?  If it is done at the hospital instead of [address], could it be done sooner?

## 2019-08-25 NOTE — Telephone Encounter (Signed)
New Message  Talked with pt and they mentionned that they just seen dr. Gardiner Rhyme on 08/24/19 and she is still continues to have severe dizzy spells. She wants to talk with dr. Gardiner Rhyme or nurse. Please call back

## 2019-08-29 ENCOUNTER — Encounter (HOSPITAL_COMMUNITY): Admission: RE | Payer: Self-pay | Source: Home / Self Care

## 2019-08-29 ENCOUNTER — Ambulatory Visit (HOSPITAL_COMMUNITY): Admission: RE | Admit: 2019-08-29 | Payer: Medicare Other | Source: Home / Self Care | Admitting: Orthopedic Surgery

## 2019-08-29 ENCOUNTER — Other Ambulatory Visit (INDEPENDENT_AMBULATORY_CARE_PROVIDER_SITE_OTHER): Payer: Medicare Other

## 2019-08-29 DIAGNOSIS — R55 Syncope and collapse: Secondary | ICD-10-CM

## 2019-08-29 DIAGNOSIS — R002 Palpitations: Secondary | ICD-10-CM

## 2019-08-29 DIAGNOSIS — R42 Dizziness and giddiness: Secondary | ICD-10-CM

## 2019-08-29 LAB — TYPE AND SCREEN
ABO/RH(D): O POS
Antibody Screen: NEGATIVE

## 2019-08-29 SURGERY — ARTHROPLASTY, HIP, TOTAL, ANTERIOR APPROACH
Anesthesia: Spinal | Site: Hip | Laterality: Right

## 2019-08-29 NOTE — Telephone Encounter (Signed)
Echo scheduled 6/3

## 2019-08-31 ENCOUNTER — Other Ambulatory Visit: Payer: Self-pay

## 2019-08-31 ENCOUNTER — Ambulatory Visit (HOSPITAL_COMMUNITY): Payer: Medicare Other | Attending: Cardiovascular Disease

## 2019-08-31 DIAGNOSIS — R002 Palpitations: Secondary | ICD-10-CM | POA: Insufficient documentation

## 2019-08-31 DIAGNOSIS — R55 Syncope and collapse: Secondary | ICD-10-CM

## 2019-08-31 DIAGNOSIS — R42 Dizziness and giddiness: Secondary | ICD-10-CM | POA: Diagnosis present

## 2019-09-01 ENCOUNTER — Telehealth: Payer: Self-pay | Admitting: Cardiology

## 2019-09-01 NOTE — Telephone Encounter (Signed)
Patient aware of results and verbalized understanding.

## 2019-09-01 NOTE — Telephone Encounter (Signed)
Patient is requesting results from echocardiogram completed on 08/31/19. Please call.

## 2019-09-01 NOTE — Telephone Encounter (Signed)
Routing to primary nurse

## 2019-09-08 ENCOUNTER — Telehealth: Payer: Self-pay | Admitting: Cardiology

## 2019-09-08 NOTE — Telephone Encounter (Signed)
Spoke with the patient and advised her that we did not have the results from her heart monitor back yet, but once we did and they were reviewed by Dr. Gardiner Rhyme we would be giving her a call.

## 2019-09-08 NOTE — Telephone Encounter (Signed)
Patient calling for heart monitor results. She states she is very anxious and to leave her a message if she does not answer.

## 2019-09-11 ENCOUNTER — Telehealth: Payer: Self-pay

## 2019-09-11 NOTE — Telephone Encounter (Signed)
° °  [facility] Pre-operative Risk Assessment    HEARTCARE STAFF: - Please ensure there is not already an duplicate clearance open for this procedure. - Under Visit Info/Reason for Call, type in Other and utilize the format Clearance MM/DD/YY or Clearance TBD. Do not use dashes or single digits. - If request is for dental extraction, please clarify the # of teeth to be extracted.  Request for surgical clearance:  1. What type of surgery is being performed? TOTAL HIP ARTHOPLASTY   2. When is this surgery scheduled? TBD   3. What type of clearance is required (medical clearance vs. Pharmacy clearance to hold med vs. Both)? MEDICAL  4. Are there any medications that need to be held prior to surgery and how long? NONE   5. Practice name and name of physician performing surgery? [facility]; DR. Rodman Key [facility]   6. What is the office phone number? [place]   7.   What is the office fax number? [place]  8.   Anesthesia type (None, local, MAC, general) ? SPINAL   Alexis Hines 09/11/2019, 12:35 PM  _________________________________________________________________   (provider comments below)

## 2019-09-15 ENCOUNTER — Other Ambulatory Visit: Payer: Self-pay | Admitting: *Deleted

## 2019-09-15 ENCOUNTER — Other Ambulatory Visit (HOSPITAL_COMMUNITY): Payer: Medicare Other

## 2019-09-15 MED ORDER — METOPROLOL TARTRATE 25 MG PO TABS
25.0000 mg | ORAL_TABLET | Freq: Two times a day (BID) | ORAL | 3 refills | Status: DC
Start: 2019-09-15 — End: 2020-02-29

## 2019-09-15 NOTE — Telephone Encounter (Signed)
Patient is calling to follow up in regards to monitor results. Please call.

## 2019-09-15 NOTE — Telephone Encounter (Signed)
Echocardiogram shows no significant abnormalities.  Monitor shows frequent PACs and episodes of SVT.  Will start on metoprolol 25 mg BID.  No further work-up recommended prior to her surgery.

## 2019-09-15 NOTE — Telephone Encounter (Signed)
   Primary Cardiologist: Donato Heinz, MD  Chart reviewed as part of pre-operative protocol coverage. Patient was contacted 09/15/2019 in reference to pre-operative risk assessment for pending surgery as outlined below.  Alexis Hines was last seen on 08/24/19 by Dr. Gardiner Rhyme.  Echocardiogram and heart monitor reviewed by Dr. Gardiner Rhyme and he recommends no further workup prior to surgery..  Therefore, based on ACC/AHA guidelines, the patient would be at acceptable risk for the planned procedure without further cardiovascular testing.   I will route this recommendation to the requesting party via Epic fax function and remove from pre-op pool. Please call with questions.  Tami Lin Albino Bufford, PA 09/15/2019, 3:09 PM

## 2019-09-15 NOTE — Telephone Encounter (Signed)
Returned call to patient-advised monitor report to be reviewed by Dr. Gardiner Rhyme and will call with results.   She is very anxious to get this report as her hip surgery depends on clearance from cardiology.    Advised Dr. Gardiner Rhyme OOO today but will return Monday.  Advised will notify Dr. Gardiner Rhyme to review when possible.   Patient aware

## 2019-09-15 NOTE — Telephone Encounter (Signed)
Notes faxed to requesting office  

## 2019-09-15 NOTE — Telephone Encounter (Signed)
See result note.  

## 2019-09-21 ENCOUNTER — Other Ambulatory Visit: Payer: Self-pay

## 2019-09-21 ENCOUNTER — Ambulatory Visit: Payer: Medicare Other | Admitting: Cardiology

## 2019-09-21 VITALS — BP 110/62 | HR 60 | Ht 59.0 in | Wt 125.0 lb

## 2019-09-21 DIAGNOSIS — R002 Palpitations: Secondary | ICD-10-CM

## 2019-09-21 DIAGNOSIS — Z01810 Encounter for preprocedural cardiovascular examination: Secondary | ICD-10-CM | POA: Diagnosis not present

## 2019-09-21 DIAGNOSIS — I471 Supraventricular tachycardia: Secondary | ICD-10-CM

## 2019-09-21 DIAGNOSIS — R55 Syncope and collapse: Secondary | ICD-10-CM

## 2019-09-21 DIAGNOSIS — R42 Dizziness and giddiness: Secondary | ICD-10-CM | POA: Diagnosis not present

## 2019-09-21 NOTE — Patient Instructions (Signed)

## 2019-09-21 NOTE — Progress Notes (Signed)
Cardiology Office Note:    Date:  09/23/2019   ID:  DAVE MANNES, DOB March 22, 1934, MRN [id number]  PCP:  Rita Ohara, MD  Cardiologist:  Donato Heinz, MD  Electrophysiologist:  None   Referring MD: Rita Ohara, MD   Chief Complaint  Patient presents with  . Palpitations    History of Present Illness:    Alexis Hines is a 86 y.o. female with a hx of basal cell cancer, squamous cell carcinoma of skin, hyperlipidemia who presents for follow-up appointment. She was referred by Dr. Tomi Bamberger for evaluation of EKG abnormalities and preop evaluation, initially seen on 08/24/2019.  Plan for hip surgery on 08/29/2019.  Preoperative EKG done at Dr. Johnsie Kindred office 5/24 was concerning for AV dissociation and accelerated junctional rhythm.  On my review, appears to be sinus rhythm with frequent PACs.  She reports that she goes to exercise class three times per week.  Does mostly chair exercises.  States that she can walk up a flight of stairs without stopping.  She denies any chest pain or dyspnea.  She does report that she has been having lightheadedness and near syncopal episodes.  Started 1 week prior.  Can occur multiple times per day.  Also has palpitations during these episodes.  Lasts less than a minute.  She underwent a nuclear stress test in [place] on 07/05/2017, which showed normal EF, no perfusion defects.  Echocardiogram on 08/31/2019 showed LVEF 83-15%, grade 1 diastolic function, normal RV function, mild RV enlargement, degenerative mitral valve with mild MR. Zio patch x3 days on 09/14/2019 showed frequent PACs (11% of beats), with frequent episodes of SVT lasting up to 20 seconds.  Started on metoprolol 25 mg twice daily.  Since last clinic visit, she reports that she has been doing well.  She denies any palpitations, lightheadedness, or syncope.  She continues to exercise regularly.  Denies any chest pain or dyspnea.  Reports that she drinks 3 cups of coffee per day and about 2  glasses of wine per week.    Past Medical History:  Diagnosis Date  . Abnormal TSH 2004   normal since  . Arthritis   . Basal cell cancer    nose; Dr. Allyson Sabal   . Diverticulosis 8/07  . Dizziness   . Migraine childhood   resolved  . Pneumonia   . Pure hypercholesterolemia   . Radial styloid tenosynovitis (de quervain) 11/2018   injected by Dr. Fredna Dow 11/2018, 02/2019  . SCCA (squamous cell carcinoma) of skin 12/2015   right temple  . Squamous cell carcinoma of skin 02/15/2017   in situ-left jaw (CX35FU)    Past Surgical History:  Procedure Laterality Date  . BREAST BIOPSY Left 1984   benign  . EYE SURGERY     Bil and eyelids lifted  . left hip intra-articular steriod injection  03/13/2014  . Mohs procedure    . TONSILLECTOMY  age 10  . TOTAL KNEE ARTHROPLASTY Left 07/2003   Dr. Theda Sers  . TOTAL KNEE ARTHROPLASTY Right 09/03/2017   Procedure: RIGHT TOTAL KNEE ARTHROPLASTY;  Surgeon: Sydnee Cabal, MD;  Location: [facility];  Service: Orthopedics;  Laterality: Right;    Current Medications: Current Meds  Medication Sig  . atorvastatin (LIPITOR) 40 MG tablet Take 1 tablet (40 mg total) by mouth at bedtime.  . Biotin 10000 MCG TABS Take 10,000 mcg by mouth daily.  . bisacodyl (DULCOLAX) 5 MG EC tablet Take 5-10 mg by mouth at bedtime as needed for moderate  constipation.  . Calcium Carbonate-Vitamin D (CALTRATE 600+D) 600-400 MG-UNIT per tablet Take 1 tablet by mouth daily at 3 pm.   . diclofenac Sodium (VOLTAREN) 1 % GEL Apply 1 application topically 2 (two) times daily as needed (knee pain.).   Marland Kitchen ibuprofen (ADVIL) 200 MG tablet Take 200-400 mg by mouth every 6 (six) hours as needed (headaches/pain.).   Marland Kitchen metoprolol tartrate (LOPRESSOR) 25 MG tablet Take 1 tablet (25 mg total) by mouth 2 (two) times daily.  . Multiple Vitamin (MULTIVITAMIN WITH MINERALS) TABS tablet Take 1 tablet by mouth daily. CENTRUM MULTIVITAMIN     Allergies:   Patient has no known allergies.   Social  History   Socioeconomic History  . Marital status: Widowed    Spouse name: Not on file  . Number of children: 2  . Years of education: Not on file  . Highest education level: Not on file  Occupational History  . Occupation: Retired Psychologist, prison and probation services  Tobacco Use  . Smoking status: Never Smoker  . Smokeless tobacco: Never Used  Vaping Use  . Vaping Use: Never used  Substance and Sexual Activity  . Alcohol use: Yes    Comment: occasional glass of wine.  . Drug use: No  . Sexual activity: Not Currently  Other Topics Concern  . Not on file  Social History Narrative   Widowed. Lives 1/2 the year in [place]. Her husband (retired Tax adviser) died from liver cancer in 05/07/2011.  Daughter lives in [place]; 2 grandchildren here, and 2 in [place] Determinants of Health   Financial Resource Strain:   . Difficulty of Paying Living Expenses:   Food Insecurity:   . Worried About Charity fundraiser in the Last Year:   . Arboriculturist in the Last Year:   Transportation Needs:   . Film/video editor (Medical):   Marland Kitchen Lack of Transportation (Non-Medical):   Physical Activity:   . Days of Exercise per Week:   . Minutes of Exercise per Session:   Stress:   . Feeling of Stress :   Social Connections:   . Frequency of Communication with Friends and Family:   . Frequency of Social Gatherings with Friends and Family:   . Attends Religious Services:   . Active Member of Clubs or Organizations:   . Attends Archivist Meetings:   Marland Kitchen Marital Status:      Family History: The patient's family history includes Cancer in her sister; Cancer (age of onset: 37) in her sister; Gout in her son; Heart disease (age of onset: 65) in her brother; Hyperlipidemia in her brother; Rheum arthritis in her daughter.  ROS:   Please see the history of present illness.     All other systems reviewed and are negative.  EKGs/Labs/Other Studies Reviewed:    The following studies were reviewed  today:   EKG:  EKG is ordered today.  The ekg ordered today demonstrates sinus rhythm, rate 56, no PACs frequent PACs with short run of SVT, occasional junctional beat  Recent Labs: 08/03/2019: TSH 0.821 08/21/2019: ALT 19; BUN 18; Creatinine, Ser 0.82; Hemoglobin 14.5; Magnesium 2.1; Platelets 280; Potassium 4.9; Sodium 140  Recent Lipid Panel    Component Value Date/Time   CHOL 181 01/03/2018 0920   TRIG 89 01/03/2018 0920   HDL 70 01/03/2018 0920   CHOLHDL 2.6 01/03/2018 0920   CHOLHDL 1.9 12/23/2016 0816   VLDL 26 12/26/2015 0706   LDLCALC 93 01/03/2018 0920  [place] 65 12/23/2016 0816    Physical Exam:    VS:  BP 110/62   Pulse 60   Ht 4\' 11"  (1.499 m)   Wt 125 lb (56.7 kg)   SpO2 98%   BMI 25.25 kg/m     Wt Readings from Last 3 Encounters:  09/21/19 125 lb (56.7 kg)  08/24/19 124 lb 12.8 oz (56.6 kg)  08/22/19 122 lb 6.4 oz (55.5 kg)     GEN:  in no acute distress HEENT: Normal NECK: No JVD; No carotid bruits LYMPHATICS: No lymphadenopathy CARDIAC: irregular, tachycardic, no murmurs RESPIRATORY:  Clear to auscultation without rales, wheezing or rhonchi  ABDOMEN: Soft, non-tender, non-distended MUSCULOSKELETAL:  No edema; No deformity  SKIN: Warm and dry NEUROLOGIC:  Alert and oriented x 3 PSYCHIATRIC:  Normal affect   ASSESSMENT:    1. Pre-operative cardiovascular examination   2. SVT (supraventricular tachycardia) ([place])   3. Lightheadedness   4. Near syncope   5. Palpitations    PLAN:    Preop evaluation: Prior to hip surgery.  She reports functional capacity greater than 4 METS and denies any chest pain/dyspnea.  In addition she had a negative nuclear stress test in [place] in 2019.  She reported lightheadedness/near syncope and having frequent PACs, with runs of SVT and occasional junctional beat on EKG.  Echocardiogram on 08/31/2019 showed LVEF 01-02%, grade 1 diastolic function, normal RV function, mild RV enlargement, degenerative mitral valve with  mild MR. Zio patch x3 days on 09/14/2019 showed frequent PACs (11% of beats), with frequent episodes of SVT lasting up to 20 seconds -No further cardiac work-up recommended prior to her surgery  Lightheadedness/near syncope/palpitations: Frequent PACs/runs of SVT on EKG. Zio patch x3 days on 09/14/2019 showed frequent PACs (11% of beats), with frequent episodes of SVT lasting up to 20 seconds.  Started on metoprolol 25 mg twice daily. -Reports palpitations have resolved and no further lightheadedness.  EKG today shows no PACs.  Continue metoprolol  RTC in 6 months   Medication Adjustments/Labs and Tests Ordered: Current medicines are reviewed at length with the patient today.  Concerns regarding medicines are outlined above.  Orders Placed This Encounter  Procedures  . EKG 12-Lead   No orders of the defined types were placed in this encounter.   Patient Instructions  Medication Instructions:  Your physician recommends that you continue on your current medications as directed. Please refer to the Current Medication list given to you today.  *If you need a refill on your cardiac medications before your next appointment, please call your pharmacy*  Follow-Up: At [facility], you and your health needs are our priority.  As part of our continuing mission to provide you with exceptional heart care, we have created designated Provider Care Teams.  These Care Teams include your primary Cardiologist (physician) and Advanced Practice Providers (APPs -  Physician Assistants and Nurse Practitioners) who all work together to provide you with the care you need, when you need it.  We recommend signing up for the patient portal called "MyChart".  Sign up information is provided on this After Visit Summary.  MyChart is used to connect with patients for Virtual Visits (Telemedicine).  Patients are able to view lab/test results, encounter notes, upcoming appointments, etc.  Non-urgent messages can be sent  to your provider as well.   To learn more about what you can do with MyChart, go to [email or web address].    Your next appointment:   6 month(s)  The format for  your next appointment:   In Person  Provider:   Oswaldo Milian, MD        Signed, Donato Heinz, MD  09/23/2019 12:38 PM    Alexis Hines

## 2019-09-25 ENCOUNTER — Telehealth: Payer: Self-pay | Admitting: Physician Assistant

## 2019-09-25 NOTE — Telephone Encounter (Signed)
Patient is calling saying that she saw Alexis Gandy, PA-C ([employer]) last month and mentioned that she had itchy spots that she could not reach.  Patient said that [employer] gave her a pamphlet on a product from [employer] ordered a Body Reach Bendable Wand with sponges from them and sent a check for $20.oo along with it.  To date Alexis Hines has not heard from [employer]. Or received the wand and when she called the number, the number is not in service.  What can she do?  Does [employer] have an alternate number for this company?  Chart [id number].

## 2019-09-25 NOTE — Telephone Encounter (Signed)
Phone call to patient to give her the number we have for [employer].  Patient states that's the number she has already and that when she spoke with them earlier in June and [employer] informed her that once payment was received it would take 7 days.  Patient states that she mailed out a check on June 7th to [employer] and she hasn't been able to reach them and she hasn't received her wand.  I asked patient if she's been in touch with her bank to see if they show the check being processed?  Patient states that she has been in contact with her bank and per her bank the check hasn't been processed.  I informed patient to continue to call her bank to follow up on if and when the check is processed.  Patient states that she'll do just that.

## 2019-09-27 NOTE — H&P (Signed)
TOTAL HIP ADMISSION H&P  Patient is admitted for right total hip arthroplasty, anterior approach.  Subjective:  Chief Complaint: Right hip primary OA / pain  HPI: Alexis Hines, 86 y.o. female, has a history of pain and functional disability in the right hip(s) due to arthritis and patient has failed non-surgical conservative treatments for greater than 12 weeks to include NSAID's and/or analgesics, corticosteriod injections and activity modification.  Onset of symptoms was gradual starting 1+ years ago with gradually worsening course since that time.The patient noted no past surgery on the right hip(s).  Patient currently rates pain in the right hip at 8 out of 10 with activity. Patient has night pain, worsening of pain with activity and weight bearing, trendelenberg gait, pain that interfers with activities of daily living and pain with passive range of motion. Patient has evidence of periarticular osteophytes and joint space narrowing by imaging studies. This condition presents safety issues increasing the risk of falls.   There is no current active infection.  Risks, benefits and expectations were discussed with the patient.  Risks including but not limited to the risk of anesthesia, blood clots, nerve damage, blood vessel damage, failure of the prosthesis, infection and up to and including death.  Patient understand the risks, benefits and expectations and wishes to proceed with surgery.   PCP: Alexis Ohara, MD  D/C Plans:       Home  Post-op Meds:       No Rx given  Tranexamic Acid:      To be given - IV   Decadron:      Is to be given  FYI:      ASA  Norco  DME:   Pt already has equipment   PT:   [facility]  Pharmacy: [facility] - [id number], [place], [place]   Patient Active Problem List   Diagnosis Date Noted  . Primary osteoarthritis of right knee 09/03/2017  . S/P knee replacement 09/03/2017  . Generalized anxiety disorder 02/04/2017  . Precordial pain 10/26/2013  . Hip  arthritis 12/29/2012  . Allergic rhinitis, cause unspecified 08/26/2011  . Heme + stool 12/23/2010  . Pure hypercholesterolemia 11/26/2010   Past Medical History:  Diagnosis Date  . Abnormal TSH 2004   normal since  . Arthritis   . Basal cell cancer    nose; Dr. Allyson Sabal   . Diverticulosis 8/07  . Dizziness   . Migraine childhood   resolved  . Pneumonia   . Pure hypercholesterolemia   . Radial styloid tenosynovitis (de quervain) 11/2018   injected by Dr. Fredna Dow 11/2018, 02/2019  . SCCA (squamous cell carcinoma) of skin 12/2015   right temple  . Squamous cell carcinoma of skin 02/15/2017   in situ-left jaw (CX35FU)    Past Surgical History:  Procedure Laterality Date  . BREAST BIOPSY Left 1984   benign  . EYE SURGERY     Bil and eyelids lifted  . left hip intra-articular steriod injection  03/13/2014  . Mohs procedure    . TONSILLECTOMY  age 70  . TOTAL KNEE ARTHROPLASTY Left 07/2003   Dr. Theda Sers  . TOTAL KNEE ARTHROPLASTY Right 09/03/2017   Procedure: RIGHT TOTAL KNEE ARTHROPLASTY;  Surgeon: Sydnee Cabal, MD;  Location: [facility];  Service: Orthopedics;  Laterality: Right;    No current facility-administered medications for this encounter.   Current Outpatient Medications  Medication Sig Dispense Refill Last Dose  . atorvastatin (LIPITOR) 40 MG tablet Take 1 tablet (40 mg total) by  mouth at bedtime. 90 tablet 3   . Biotin 10000 MCG TABS Take 10,000 mcg by mouth daily.     . bisacodyl (DULCOLAX) 5 MG EC tablet Take 5-10 mg by mouth at bedtime as needed for moderate constipation.     . Calcium Carbonate-Vitamin D (CALTRATE 600+D) 600-400 MG-UNIT per tablet Take 1 tablet by mouth daily at 3 pm.      . diclofenac Sodium (VOLTAREN) 1 % GEL Apply 1 application topically 2 (two) times daily as needed (knee pain.).      Marland Kitchen ibuprofen (ADVIL) 200 MG tablet Take 200-400 mg by mouth every 6 (six) hours as needed (headaches/pain.).      Marland Kitchen metoprolol tartrate (LOPRESSOR) 25 MG tablet Take  1 tablet (25 mg total) by mouth 2 (two) times daily. 180 tablet 3   . Multiple Vitamin (MULTIVITAMIN WITH MINERALS) TABS tablet Take 1 tablet by mouth daily. CENTRUM MULTIVITAMIN      No Known Allergies   Social History   Tobacco Use  . Smoking status: Never Smoker  . Smokeless tobacco: Never Used  Substance Use Topics  . Alcohol use: Yes    Comment: occasional glass of wine.    Family History  Problem Relation Age of Onset  . Cancer Sister        bone cancer L leg with mets to lung, diagnosed age 58; s/p amputation  . Hyperlipidemia Brother   . Heart disease Brother 72       CABG  . Cancer Sister 71       breast cancer and lung cancer (lung dx'd first)  . Rheum arthritis Daughter   . Gout Son      Review of Systems  Constitutional: Negative.   HENT: Negative.   Eyes: Negative.   Respiratory: Negative.   Cardiovascular: Negative.   Gastrointestinal: Negative.   Genitourinary: Negative.   Musculoskeletal: Positive for joint pain.  Skin: Negative.   Neurological: Negative.   Endo/Heme/Allergies: Negative.   Psychiatric/Behavioral: Negative.       Objective:   Physical Exam Constitutional:      Appearance: She is well-developed.  HENT:     Head: Normocephalic.  Eyes:     Pupils: Pupils are equal, round, and reactive to light.  Neck:     Thyroid: No thyromegaly.     Vascular: No JVD.     Trachea: No tracheal deviation.  Cardiovascular:     Rate and Rhythm: Normal rate and regular rhythm.  Pulmonary:     Effort: Pulmonary effort is normal. No respiratory distress.     Breath sounds: Normal breath sounds. No wheezing.  Abdominal:     Palpations: Abdomen is soft.     Tenderness: There is no abdominal tenderness. There is no guarding.  Musculoskeletal:     Cervical back: Neck supple.     Right hip: Tenderness and bony tenderness present. Decreased range of motion.  Lymphadenopathy:     Cervical: No cervical adenopathy.  Skin:    General: Skin is warm  and dry.  Neurological:     Mental Status: She is alert and oriented to person, place, and time.       Labs:  Estimated body mass index is 25.25 kg/m as calculated from the following:   Height as of 09/21/19: 4\' 11"  (1.499 m).   Weight as of 09/21/19: 56.7 kg.   Imaging Review Plain radiographs demonstrate severe degenerative joint disease of the right hip. The bone quality appears to be good for age  and reported activity level.      Assessment/Plan:  End stage arthritis, right hip  The patient history, physical examination, clinical judgement of the provider and imaging studies are consistent with end stage degenerative joint disease of the right hip and total hip arthroplasty is deemed medically necessary. The treatment options including medical management, injection therapy, arthroscopy and arthroplasty were discussed at length. The risks and benefits of total hip arthroplasty were presented and reviewed. The risks due to aseptic loosening, infection, stiffness, dislocation/subluxation,  thromboembolic complications and other imponderables were discussed.  The patient acknowledged the explanation, agreed to proceed with the plan and consent was signed. Patient is being admitted for inpatient treatment for surgery, pain control, PT, OT, prophylactic antibiotics, VTE prophylaxis, progressive ambulation and ADL's and discharge planning.The patient is planning to be discharged home.     [place] Sherrika Weakland   PA-C  09/27/2019, 12:10 PM

## 2019-09-27 NOTE — Patient Instructions (Addendum)
DUE TO COVID-19 ONLY ONE VISITOR IS ALLOWED TO COME WITH YOU AND STAY IN THE WAITING ROOM ONLY DURING PRE OP AND PROCEDURE DAY OF SURGERY. THE 1 VISITOR MAY VISIT WITH YOU AFTER SURGERY IN YOUR PRIVATE ROOM DURING VISITING HOURS ONLY!  YOU NEED TO HAVE A COVID 19 TEST ON: 10/06/19 @ 2:30 pm , THIS TEST MUST BE DONE BEFORE SURGERY, COME  Alexis Hines, [place] , [id number].  ([place]) ONCE YOUR COVID TEST IS COMPLETED, PLEASE BEGIN THE QUARANTINE INSTRUCTIONS AS OUTLINED IN YOUR HANDOUT.                Alexis Hines    Your procedure is scheduled on: 10/10/19   Report to [facility] Main  Entrance   Report to admitting at: 11:50  AM     Call this number if you have problems the morning of surgery [phone]    Remember:   NO SOLID FOOD AFTER MIDNIGHT THE NIGHT PRIOR TO SURGERY. NOTHING BY MOUTH EXCEPT CLEAR LIQUIDS UNTIL : 11:20 am. PLEASE FINISH ENSURE DRINK PER SURGEON ORDER  WHICH NEEDS TO BE COMPLETED AT: 11:20 am .   CLEAR LIQUID DIET   Foods Allowed                                                                     Foods Excluded  Coffee and tea, regular and decaf                             liquids that you cannot  Plain Jell-O any favor except red or purple                                           see through such as: Fruit ices (not with fruit pulp)                                     milk, soups, orange juice  Iced Popsicles                                    All solid food Carbonated beverages, regular and diet                                    Cranberry, grape and apple juices Sports drinks like Gatorade Lightly seasoned clear broth or consume(fat free) Sugar, honey syrup  Sample Menu Breakfast                                Lunch                                     Supper Cranberry juice  Beef broth                            Chicken broth Jell-O                                     Grape juice                            Apple juice Coffee or tea                        Jell-O                                      Popsicle                                                Coffee or tea                        Coffee or tea  _____________________________________________________________________   BRUSH YOUR TEETH MORNING OF SURGERY AND RINSE YOUR MOUTH OUT, NO CHEWING GUM CANDY OR MINTS.     Take these medicines the morning of surgery with A SIP OF WATER: metoprolol.                                You may not have any metal on your body including hair pins and              piercings  Do not wear jewelry, make-up, lotions, powders or perfumes, deodorant             Do not wear nail polish on your fingernails.  Do not shave  48 hours prior to surgery.             Do not bring valuables to the hospital. [name].  Contacts, dentures or bridgework may not be worn into surgery.  Leave suitcase in the car. After surgery it may be brought to your room.     Patients discharged the day of surgery will not be allowed to drive home. IF YOU ARE HAVING SURGERY AND GOING HOME THE SAME DAY, YOU MUST HAVE AN ADULT TO DRIVE YOU HOME AND BE WITH YOU FOR 24 HOURS. YOU MAY GO HOME BY TAXI OR UBER OR ORTHERWISE, BUT AN ADULT MUST ACCOMPANY YOU HOME AND STAY WITH YOU FOR 24 HOURS.  Name and phone number of your driver:  Special Instructions: N/A              Please read over the following fact sheets you were given: _____________________________________________________________________  [facility] - Preparing for Surgery Before surgery, you can play an important role.  Because skin is not sterile, your skin needs to be as free of germs as possible.  You can reduce the number of germs on your skin by washing with CHG (chlorahexidine gluconate) soap before surgery.  CHG is an antiseptic cleaner which kills germs and bonds with the skin to continue killing germs even after  washing. Please DO NOT use if you have an allergy to CHG or antibacterial soaps.  If your skin becomes reddened/irritated stop using the CHG and inform your nurse when you arrive at Short Stay. Do not shave (including legs and underarms) for at least 48 hours prior to the first CHG shower.  You may shave your face/neck. Please follow these instructions carefully:  1.  Shower with CHG Soap the night before surgery and the  morning of Surgery.  2.  If you choose to wash your hair, wash your hair first as usual with your  normal  shampoo.  3.  After you shampoo, rinse your hair and body thoroughly to remove the  shampoo.                           4.  Use CHG as you would any other liquid soap.  You can apply chg directly  to the skin and wash                       Gently with a scrungie or clean washcloth.  5.  Apply the CHG Soap to your body ONLY FROM THE NECK DOWN.   Do not use on face/ open                           Wound or open sores. Avoid contact with eyes, ears mouth and genitals (private parts).                       Wash face,  Genitals (private parts) with your normal soap.             6.  Wash thoroughly, paying special attention to the area where your surgery  will be performed.  7.  Thoroughly rinse your body with warm water from the neck down.  8.  DO NOT shower/wash with your normal soap after using and rinsing off  the CHG Soap.                9.  Pat yourself dry with a clean towel.            10.  Wear clean pajamas.            11.  Place clean sheets on your bed the night of your first shower and do not  sleep with pets. Day of Surgery : Do not apply any lotions/deodorants the morning of surgery.  Please wear clean clothes to the hospital/surgery center.  FAILURE TO FOLLOW THESE INSTRUCTIONS MAY RESULT IN THE CANCELLATION OF YOUR SURGERY PATIENT SIGNATURE_________________________________  NURSE  SIGNATURE__________________________________  ________________________________________________________________________   Alexis Hines  An incentive spirometer is a tool that can help keep your lungs clear and active. This tool measures how well you are filling your lungs with each breath. Taking long deep breaths may help reverse or decrease the chance of developing breathing (pulmonary) problems (especially infection) following:  A long period of time when you are unable to move or be active. BEFORE THE PROCEDURE   If the spirometer includes an indicator to show your best effort, your nurse or respiratory therapist will set it to a desired goal.  If possible, sit up straight or lean slightly forward. Try not to slouch.  Hold the incentive spirometer in an upright position. INSTRUCTIONS FOR USE  1. Sit on the edge of your bed if possible, or sit up as far as you can in bed or on a chair. 2. Hold the incentive spirometer in an upright position. 3. Breathe out normally. 4. Place the mouthpiece in your mouth and seal your lips tightly around it. 5. Breathe in slowly and as deeply as possible, raising the piston or the ball toward the top of the column. 6. Hold your breath for 3-5 seconds or for as long as possible. Allow the piston or ball to fall to the bottom of the column. 7. Remove the mouthpiece from your mouth and breathe out normally. 8. Rest for a few seconds and repeat Steps 1 through 7 at least 10 times every 1-2 hours when you are awake. Take your time and take a few normal breaths between deep breaths. 9. The spirometer may include an indicator to show your best effort. Use the indicator as a goal to work toward during each repetition. 10. After each set of 10 deep breaths, practice coughing to be sure your lungs are clear. If you have an incision (the cut made at the time of surgery), support your incision when coughing by placing a pillow or rolled up towels firmly  against it. Once you are able to get out of bed, walk around indoors and cough well. You may stop using the incentive spirometer when instructed by your caregiver.  RISKS AND COMPLICATIONS  Take your time so you do not get dizzy or light-headed.  If you are in pain, you may need to take or ask for pain medication before doing incentive spirometry. It is harder to take a deep breath if you are having pain. AFTER USE  Rest and breathe slowly and easily.  It can be helpful to keep track of a log of your progress. Your caregiver can provide you with a simple table to help with this. If you are using the spirometer at home, follow these instructions: [place] IF:   You are having difficultly using the spirometer.  You have trouble using the spirometer as often as instructed.  Your pain medication is not giving enough relief while using the spirometer.  You develop fever of 100.5 F (38.1 C) or higher. SEEK IMMEDIATE MEDICAL CARE IF:   You cough up bloody sputum that had not been present before.  You develop fever of 102 F (38.9 C) or greater.  You develop worsening pain at or near the incision site. MAKE SURE YOU:   Understand these instructions.  Will watch your condition.  Will get help right away if you are not doing well or get worse. Document Released: 07/27/2006 Document Revised: 06/08/2011 Document Reviewed: 09/27/2006 [facility] Patient Information 2014 [place], [place].   ________________________________________________________________________

## 2019-09-28 ENCOUNTER — Encounter (HOSPITAL_COMMUNITY): Payer: Self-pay

## 2019-09-28 ENCOUNTER — Other Ambulatory Visit: Payer: Self-pay

## 2019-09-28 ENCOUNTER — Encounter (HOSPITAL_COMMUNITY)
Admission: RE | Admit: 2019-09-28 | Discharge: 2019-09-28 | Disposition: A | Discharge status: Home / Self Care | Payer: Medicare Other | Source: Ambulatory Visit | Attending: Orthopedic Surgery | Admitting: Orthopedic Surgery

## 2019-09-28 DIAGNOSIS — Z01812 Encounter for preprocedural laboratory examination: Secondary | ICD-10-CM | POA: Diagnosis present

## 2019-09-28 HISTORY — DX: Atrial premature depolarization: I49.1

## 2019-09-28 HISTORY — DX: Essential (primary) hypertension: I10

## 2019-09-28 LAB — CBC
HCT: 46.6 % — ABNORMAL HIGH (ref 36.0–46.0)
Hemoglobin: 14.9 g/dL (ref 12.0–15.0)
MCH: 30.1 pg (ref 26.0–34.0)
MCHC: 32 g/dL (ref 30.0–36.0)
MCV: 94.1 fL (ref 80.0–100.0)
Platelets: 260 10*3/uL (ref 150–400)
RBC: 4.95 MIL/uL (ref 3.87–5.11)
RDW: 12.7 % (ref 11.5–15.5)
WBC: 7 10*3/uL (ref 4.0–10.5)
nRBC: 0 % (ref 0.0–0.2)

## 2019-09-28 LAB — BASIC METABOLIC PANEL
Anion gap: 8 (ref 5–15)
BUN: 18 mg/dL (ref 8–23)
CO2: 28 mmol/L (ref 22–32)
Calcium: 9.1 mg/dL (ref 8.9–10.3)
Chloride: 102 mmol/L (ref 98–111)
Creatinine, Ser: 0.81 mg/dL (ref 0.44–1.00)
GFR calc Af Amer: >60 mL/min (ref >60)
GFR calc non Af Amer: >60 mL/min (ref >60)
Glucose, Bld: 88 mg/dL (ref 70–99)
Potassium: 4.4 mmol/L (ref 3.5–5.1)
Sodium: 138 mmol/L (ref 135–145)

## 2019-09-28 LAB — SURGICAL PCR SCREEN
MRSA, PCR: NEGATIVE
Staphylococcus aureus: NEGATIVE

## 2019-09-28 LAB — TYPE AND SCREEN
ABO/RH(D): O POS
Antibody Screen: NEGATIVE

## 2019-09-28 NOTE — Progress Notes (Signed)
Anesthesia Chart Review:   Case: [id number] Date/Time: 10/10/19 1405   Procedure: TOTAL HIP ARTHROPLASTY ANTERIOR APPROACH (Right Hip) - 70 mins   Anesthesia type: Spinal   Pre-op diagnosis: Right hip osteoarthritis   Location[facility] ROOM 10 / [facility]   Surgeons: Paralee Cancel, MD      DISCUSSION: Pt is an 86 year old with hx frequent PACs/SVT (by cardiac monitor 09/14/19; pt started on metoprolol), HTN   VS: BP (!) 145/72   Pulse 65   Temp 36.7 C (Oral)   Resp 18   Ht _0  (1.499 m)   Wt 56.9 kg   SpO2 98%   BMI 25.34 kg/m    PROVIDERS: - PCP is Rita Ohara, MD - Cardiologist is Oswaldo Milian, MD. Dr. Gardiner Rhyme documented "no further cardiac work-up recommended prior to her surgery" at last office visit 09/21/19.    LABS: Labs reviewed: Acceptable for surgery. (all labs ordered are listed, but only abnormal results are displayed)  Labs Reviewed  CBC - Abnormal; Notable for the following components:      Result Value   HCT 46.6 (*)    All other components within normal limits  SURGICAL PCR SCREEN  BASIC METABOLIC PANEL  TYPE AND SCREEN    EKG 09/21/19 (is dated 09/25/19 in Epic):  sinus rhythm, rate 56, no PACs   CV: Cardiac event monitor 09/14/19:   8072 episodes of SVT, longest lasting 20 seconds.  Frequent isolated PACs (11.3% of beats), occasional atrial couplets (3.3%) and triplets (2.5%)   Echo 08/31/19:  1. Left ventricular ejection fraction, by estimation, is 70 to 75%. The left ventricle has hyperdynamic function. The left ventricle has no regional wall motion abnormalities. Left ventricular diastolic parameters are consistent with Grade I diastolic dysfunction (impaired relaxation).  2. Right ventricular systolic function is normal. The right ventricular size is mildly enlarged. There is normal pulmonary artery systolic pressure. The estimated right ventricular systolic pressure is 84.6 mmHg.  3. Right atrial size was mildly dilated.  4. Degenerative  mitral valve disease. There is mild to moderate MAC. The chordal apparatus is calcified as well. The mitral valve is degenerative. Mild mitral valve regurgitation. No evidence of mitral stenosis.  5. The aortic valve is tricuspid. Aortic valve regurgitation is not visualized. Mild aortic valve sclerosis is present, with no evidence of aortic valve stenosis.  6. The inferior vena cava is normal in size with greater than 50% respiratory variability, suggesting right atrial pressure of 3 mmHg.    Nuclear stress test 07/05/17:  1. No chest discomfort or significant EKG changes suggestive of ischemia during Lexiscan stress test 2. No Lexicscan induced complex ventricular arrhythmias   Carotid US 06/16/17:  - No hemodynamically significant lesions of the carotid arteries   Past Medical History:  Diagnosis Date  . Abnormal TSH 2004   normal since  . Arthritis   . Basal cell cancer    nose; Dr. Allyson Sabal   . Diverticulosis 8/07  . Dizziness   . Hypertension   . Migraine childhood   resolved  . PAC (premature atrial contraction)    frequent on cardiac monitor 09/14/19  . Pneumonia   . Pure hypercholesterolemia   . Radial styloid tenosynovitis (de quervain) 11/2018   injected by Dr. Fredna Dow 11/2018, 02/2019  . SCCA (squamous cell carcinoma) of skin 12/2015   right temple  . Squamous cell carcinoma of skin 02/15/2017   in situ-left jaw (CX35FU)    Past Surgical History:  Procedure Laterality  Date  . BREAST BIOPSY Left 1984   benign  . EYE SURGERY     Bil and eyelids lifted  . left hip intra-articular steriod injection  03/13/2014  . Mohs procedure    . TONSILLECTOMY  age 18  . TOTAL KNEE ARTHROPLASTY Left 07/2003   Dr. Theda Sers  . TOTAL KNEE ARTHROPLASTY Right 09/03/2017   Procedure: RIGHT TOTAL KNEE ARTHROPLASTY;  Surgeon: Sydnee Cabal, MD;  Location: [facility];  Service: Orthopedics;  Laterality: Right;    MEDICATIONS: . atorvastatin (LIPITOR) 40 MG tablet  . Biotin 10000 MCG TABS  .  bisacodyl (DULCOLAX) 5 MG EC tablet  . Calcium Carbonate-Vitamin D (CALTRATE 600+D) 600-400 MG-UNIT per tablet  . diclofenac Sodium (VOLTAREN) 1 % GEL  . ibuprofen (ADVIL) 200 MG tablet  . metoprolol tartrate (LOPRESSOR) 25 MG tablet  . Multiple Vitamin (MULTIVITAMIN WITH MINERALS) TABS tablet   No current facility-administered medications for this encounter.    If no changes, I anticipate pt can proceed with surgery as scheduled.   Willeen Cass, FNP-BC [facility] Short Stay Surgical Center/Anesthesiology Phone: [phone] 09/28/2019 2:56 PM

## 2019-09-28 NOTE — Progress Notes (Signed)
COVID Vaccine Completed:yes Date COVID Vaccine completed:06/27/19 COVID vaccine manufacturer: Pfizer    *[employer]   PCP - Dr. Rita Ohara Cardiologist - Dr. Oswaldo Milian. LOV: 09/21/19. Epic. Clearance.  Chest x-ray -  EKG - 09/25/19. EPIC Stress Test -  ECHO - 08/31/19.  Cardiac Cath -   Sleep Study -  CPAP -   Fasting Blood Sugar -  Checks Blood Sugar _____ times a day  Blood Thinner Instructions: Aspirin Instructions: Last Dose:  Anesthesia review: Hx: HTN.Case was cancelled before because pt. Was having dizziness.She said she feels better now.She exercise 3 times a week,still driving and does her own grocery shopping every week.  Patient denies shortness of breath, fever, cough and chest pain at PAT appointment   Patient verbalized understanding of instructions that were given to them at the PAT appointment. Patient was also instructed that they will need to review over the PAT instructions again at home before surgery.

## 2019-09-28 NOTE — Anesthesia Preprocedure Evaluation (Addendum)
Anesthesia Evaluation  Patient identified by MRN, date of birth, ID band Patient awake    Reviewed: Allergy & Precautions, NPO status , Patient's Chart, lab work & pertinent test results, reviewed documented beta blocker date and time   Airway Mallampati: I       Dental no notable dental hx.    Pulmonary    Pulmonary exam normal        Cardiovascular hypertension, Pt. on home beta blockers Normal cardiovascular exam     Neuro/Psych    GI/Hepatic negative GI ROS, Neg liver ROS,   Endo/Other    Renal/GU negative Renal ROS     Musculoskeletal   Abdominal Normal abdominal exam  (+)   Peds  Hematology negative hematology ROS (+)   Anesthesia Other Findings Alexis Heinz, MD  09/15/2019 1:55 PM EDT   Frequent extra beats and fast heart rhythm from top chamber of heart. Recommend starting metoprolol 25 mg BID to suppress these extra beats. Given no significant abnormalities on echo, no further work-up recommended prior to her surgery.     1. Left ventricular ejection fraction, by estimation, is 70 to 75%. The  left ventricle has hyperdynamic function. The left ventricle has no  regional wall motion abnormalities. Left ventricular diastolic parameters  are consistent with Grade I diastolic  dysfunction (impaired relaxation).  2. Right ventricular systolic function is normal. The right ventricular  size is mildly enlarged. There is normal pulmonary artery systolic  pressure. The estimated right ventricular systolic pressure is 62.5 mmHg.  3. Right atrial size was mildly dilated.  4. Degenerative mitral valve disease. There is mild to moderate MAC. The  chordal apparatus is calcified as well. The mitral valve is degenerative.  Mild mitral valve regurgitation. No evidence of mitral stenosis.  5. The aortic valve is tricuspid. Aortic valve regurgitation is not  visualized. Mild aortic valve sclerosis is  present, with no evidence of  aortic valve stenosis.  6. The inferior vena cava is normal in size with greater than 50%  respiratory variability, suggesting right atrial pressure of 3 mmHg.    Reproductive/Obstetrics                           Anesthesia Physical Anesthesia Plan  ASA: II  Anesthesia Plan: Spinal   Post-op Pain Management:    Induction:   PONV Risk Score and Plan: 3 and Ondansetron and Treatment may vary due to age or medical condition  Airway Management Planned: Simple Face Mask and Natural Airway  Additional Equipment: None  Intra-op Plan:   Post-operative Plan:   Informed Consent: I have reviewed the patients History and Physical, chart, labs and discussed the procedure including the risks, benefits and alternatives for the proposed anesthesia with the patient or authorized representative who has indicated his/her understanding and acceptance.       Plan Discussed with: CRNA  Anesthesia Plan Comments: (See APP note by Durel Salts, FNP)       Anesthesia Quick Evaluation

## 2019-10-03 ENCOUNTER — Telehealth: Payer: Self-pay | Admitting: Family Medicine

## 2019-10-03 NOTE — Telephone Encounter (Signed)
We don't have this in her records.  Is the antibiotic recommended due to her h/o joint replacement, or was it recommended by cardiologist?  Guidelines changed in 2016 regarding needing antibiotics for joint replacements for routine dental work (no longer needed; it used to be recommended for 2 years after replacement, and she is now past that).  If she is having major extractions/procedures, then it IS still recommended.  I guess it would be up to her ortho whether they are still recommending it or not (in which case they can rx it--it would be 4 pills an hour prior to the dentist, doesn't need 12 pills, should be #8 for 2 visits/year).  (according to her recent echo, there is no indication from a cardiac standpoint).

## 2019-10-03 NOTE — Telephone Encounter (Signed)
Pt called and states she has a dentist appt Thursday. She normally has her doctor in [place] fills this for her but they start they can't due to practicing [place]. She is asking you to fill that for her. She states she normally gets 12 pills 500 MG. She is requesting this go to [facility] at [address]. PT can be reached at [phone].

## 2019-10-03 NOTE — Telephone Encounter (Signed)
Pt was notified and is waiting to here back from ortho

## 2019-10-04 ENCOUNTER — Other Ambulatory Visit: Payer: Self-pay

## 2019-10-04 ENCOUNTER — Encounter: Payer: Self-pay | Admitting: Family Medicine

## 2019-10-04 ENCOUNTER — Ambulatory Visit (INDEPENDENT_AMBULATORY_CARE_PROVIDER_SITE_OTHER): Payer: Medicare Other | Admitting: Family Medicine

## 2019-10-04 VITALS — BP 122/70 | HR 60 | Temp 98.1°F | Ht 59.0 in | Wt 125.2 lb

## 2019-10-04 DIAGNOSIS — T148XXA Other injury of unspecified body region, initial encounter: Secondary | ICD-10-CM

## 2019-10-04 DIAGNOSIS — Z789 Other specified health status: Secondary | ICD-10-CM | POA: Diagnosis not present

## 2019-10-04 NOTE — Progress Notes (Signed)
Chief Complaint  Patient presents with  . Fall    had a fall last Thursday and bruised chin, wants to make sure that it is not infected.     09/28/2019 she was leaning forward (while squatting), weeding in the yard, and she felt forward, face and chest landed in the dirt. She scraped the bottom of her chin. She washed it with water.  She denies using soap on her face (ever). She "let it go" for a couple of days, started looking "funny", so she then washed it well with soap and water, and bought Neosporin to use.  She thinks it has started to improve a little  See phone message regarding request for antibiotic prescription for prophylaxis for the dentist.  She reports she sees dentist in [facility] 2x/year, and he is the one that insists on the pre-treatment with amoxil.  The dentist here doesn't recommend it. She got in contact with her orthopedist after hearing back from our office yesterday, who sent in her Rx for amoxil.  No further dizziness, palpitations. She is scheduled for hip surgery next week.  PMH, PSH, SH reviewed  Outpatient Encounter Medications as of 10/04/2019  Medication Sig Note  . amoxicillin (AMOXIL) 500 MG capsule Take 2,000 mg by mouth once. 10/04/2019: Take one hour prior to dental work  . atorvastatin (LIPITOR) 40 MG tablet Take 1 tablet (40 mg total) by mouth at bedtime.   . Biotin 10000 MCG TABS Take 10,000 mcg by mouth daily.   . Calcium Carbonate-Vitamin D (CALTRATE 600+D) 600-400 MG-UNIT per tablet Take 1 tablet by mouth daily at 3 pm.    . metoprolol tartrate (LOPRESSOR) 25 MG tablet Take 1 tablet (25 mg total) by mouth 2 (two) times daily.   . Multiple Vitamin (MULTIVITAMIN WITH MINERALS) TABS tablet Take 1 tablet by mouth daily. CENTRUM MULTIVITAMIN   . bisacodyl (DULCOLAX) 5 MG EC tablet Take 5-10 mg by mouth at bedtime as needed for moderate constipation. (Patient not taking: Reported on 10/04/2019)   . diclofenac Sodium (VOLTAREN) 1 % GEL Apply 1 application topically 2  (two) times daily as needed (knee pain.).  (Patient not taking: Reported on 10/04/2019) 08/21/2019: 3 times a week  . ibuprofen (ADVIL) 200 MG tablet Take 200-400 mg by mouth every 6 (six) hours as needed (headaches/pain.).  (Patient not taking: Reported on 10/04/2019)    No facility-administered encounter medications on file as of 10/04/2019.   No Known Allergies  ROS: no fever, chills, GI or GU complaints, chest pain, palpitations, shortness of breath.  Skin lesion on chin per HPI.   PHYSICAL EXAM:  BP 122/70   Pulse 60   Temp 98.1 F (36.7 C) (Tympanic)   Ht 4\' 11"  (1.499 m)   Wt 125 lb 3.2 oz (56.8 kg)   BMI 25.29 kg/m   Well-appearing, pleasant, talkative/anxious female, in no distress HEENT: conjunctiva and sclera are clear, EOMI In the center of lower chin there is a 75mm very superficial abrasion.  There is no crusting, surrounding erythema or soft tissue swelling Neck: no lymphadenopathy or spinal tenderness Heart:  regular rate and rhythm, no murmur or ectopy  ASSESSMENT/PLAN:  Skin abrasion - of chin, no e/o infection. Cont topical antibacterial ointment. S/sx of infection reviewed. Proper cleaning after injury reviewed  Antibiotic prophylaxis not prescribed by me - discussed guidelines with pt, and that ultimately it is up to the orthopedist if they want her to take it.  She did get rx from them to take  prior to dentist   Reviewed guidelines/recommendations regarding antibacterial prophylaxis prior to the dentist.  15-18 min FTF, with additional time spent in chart review and documentation

## 2019-10-04 NOTE — Patient Instructions (Signed)
The small abrasion on your chin does not appear to be infected. Continue to use a topical antibacterial ointment (2-3 times/day) until it is healed. If you notice increased redness, pain, drainage or crusting, fever, or other new concerns, please return for re-evaluation.

## 2019-10-06 ENCOUNTER — Other Ambulatory Visit (HOSPITAL_COMMUNITY)
Admission: RE | Admit: 2019-10-06 | Discharge: 2019-10-06 | Disposition: A | Discharge status: Home / Self Care | Payer: Medicare Other | Source: Ambulatory Visit | Attending: Orthopedic Surgery | Admitting: Orthopedic Surgery

## 2019-10-06 DIAGNOSIS — Z01812 Encounter for preprocedural laboratory examination: Secondary | ICD-10-CM | POA: Diagnosis present

## 2019-10-06 DIAGNOSIS — Z20822 Contact with and (suspected) exposure to covid-19: Secondary | ICD-10-CM | POA: Insufficient documentation

## 2019-10-06 LAB — SARS CORONAVIRUS 2 (TAT 6-24 HRS): SARS Coronavirus 2: NEGATIVE

## 2019-10-10 ENCOUNTER — Ambulatory Visit (HOSPITAL_COMMUNITY): Payer: Medicare Other

## 2019-10-10 ENCOUNTER — Ambulatory Visit (HOSPITAL_COMMUNITY): Payer: Medicare Other | Admitting: Emergency Medicine

## 2019-10-10 ENCOUNTER — Observation Stay (HOSPITAL_COMMUNITY): Payer: Medicare Other

## 2019-10-10 ENCOUNTER — Other Ambulatory Visit (HOSPITAL_COMMUNITY): Payer: Self-pay | Admitting: Anatomic Pathology & Clinical Pathology

## 2019-10-10 ENCOUNTER — Observation Stay (HOSPITAL_COMMUNITY)
Admission: RE | Admit: 2019-10-10 | Discharge: 2019-10-12 | Disposition: A | Discharge status: Home / Self Care | Payer: Medicare Other | Attending: Orthopedic Surgery | Admitting: Orthopedic Surgery

## 2019-10-10 ENCOUNTER — Encounter (HOSPITAL_COMMUNITY): Payer: Self-pay | Admitting: Orthopedic Surgery

## 2019-10-10 ENCOUNTER — Other Ambulatory Visit: Payer: Self-pay

## 2019-10-10 ENCOUNTER — Encounter (HOSPITAL_COMMUNITY)
Admission: RE | Disposition: A | Discharge status: Home / Self Care | Payer: Self-pay | Source: Home / Self Care | Attending: Orthopedic Surgery

## 2019-10-10 DIAGNOSIS — Z96641 Presence of right artificial hip joint: Secondary | ICD-10-CM | POA: Diagnosis not present

## 2019-10-10 DIAGNOSIS — Z419 Encounter for procedure for purposes other than remedying health state, unspecified: Secondary | ICD-10-CM

## 2019-10-10 DIAGNOSIS — Z96649 Presence of unspecified artificial hip joint: Secondary | ICD-10-CM

## 2019-10-10 DIAGNOSIS — M1611 Unilateral primary osteoarthritis, right hip: Principal | ICD-10-CM | POA: Insufficient documentation

## 2019-10-10 DIAGNOSIS — E663 Overweight: Secondary | ICD-10-CM | POA: Diagnosis present

## 2019-10-10 HISTORY — PX: TOTAL HIP ARTHROPLASTY: SHX124

## 2019-10-10 SURGERY — ARTHROPLASTY, HIP, TOTAL, ANTERIOR APPROACH
Anesthesia: Spinal | Site: Hip | Laterality: Right

## 2019-10-10 MED ORDER — METOCLOPRAMIDE HCL 5 MG/ML IJ SOLN: 5.0000 mg | Freq: Three times a day (TID) | INTRAMUSCULAR | Status: DC | PRN

## 2019-10-10 MED ORDER — STERILE WATER FOR IRRIGATION IR SOLN
Status: DC | PRN
  Administered 2019-10-10: 2000 mL

## 2019-10-10 MED ORDER — OXYCODONE HCL 5 MG PO TABS
5.0000 mg | ORAL_TABLET | ORAL | Status: DC | PRN
  Administered 2019-10-10 – 2019-10-12 (×3): 10 mg via ORAL
  Filled 2019-10-10 (×3): qty 2

## 2019-10-10 MED ORDER — CEFAZOLIN SODIUM-DEXTROSE 2-4 GM/100ML-% IV SOLN
2.0000 g | INTRAVENOUS | Status: AC
  Administered 2019-10-10: 2 g via INTRAVENOUS

## 2019-10-10 MED ORDER — ATORVASTATIN CALCIUM 40 MG PO TABS
40.0000 mg | ORAL_TABLET | Freq: Every day | ORAL | Status: DC
  Administered 2019-10-10 – 2019-10-11 (×2): 40 mg via ORAL
  Filled 2019-10-10 (×2): qty 1

## 2019-10-10 MED ORDER — BUPIVACAINE IN DEXTROSE 0.75-8.25 % IT SOLN
INTRATHECAL | Status: DC | PRN
  Administered 2019-10-10: 1.4 mL via INTRATHECAL

## 2019-10-10 MED ORDER — HYDROMORPHONE HCL 1 MG/ML IJ SOLN
0.5000 mg | INTRAMUSCULAR | Status: DC | PRN
  Administered 2019-10-10: 1 mg via INTRAVENOUS
  Filled 2019-10-10: qty 1

## 2019-10-10 MED ORDER — TRANEXAMIC ACID-NACL 1000-0.7 MG/100ML-% IV SOLN
INTRAVENOUS | Status: AC
  Filled 2019-10-10: qty 100

## 2019-10-10 MED ORDER — METHOCARBAMOL 500 MG IVPB - SIMPLE MED
500.0000 mg | Freq: Four times a day (QID) | INTRAVENOUS | Status: DC | PRN
  Filled 2019-10-10: qty 50

## 2019-10-10 MED ORDER — PHENOL 1.4 % MT LIQD: 1.0000 | OROMUCOSAL | Status: DC | PRN

## 2019-10-10 MED ORDER — METOPROLOL TARTRATE 25 MG PO TABS
25.0000 mg | ORAL_TABLET | Freq: Two times a day (BID) | ORAL | Status: DC
  Administered 2019-10-10 – 2019-10-12 (×4): 25 mg via ORAL
  Filled 2019-10-10 (×4): qty 1

## 2019-10-10 MED ORDER — ONDANSETRON HCL 4 MG/2ML IJ SOLN: 4.0000 mg | Freq: Once | INTRAMUSCULAR | Status: DC | PRN

## 2019-10-10 MED ORDER — CEFAZOLIN SODIUM-DEXTROSE 2-4 GM/100ML-% IV SOLN
2.0000 g | Freq: Four times a day (QID) | INTRAVENOUS | Status: AC
  Administered 2019-10-10 – 2019-10-11 (×2): 2 g via INTRAVENOUS
  Filled 2019-10-10 (×2): qty 100

## 2019-10-10 MED ORDER — DIPHENHYDRAMINE HCL 12.5 MG/5ML PO ELIX: 12.5000 mg | ORAL_SOLUTION | ORAL | Status: DC | PRN

## 2019-10-10 MED ORDER — CHLORHEXIDINE GLUCONATE 0.12 % MT SOLN
15.0000 mL | Freq: Once | OROMUCOSAL | Status: AC
  Administered 2019-10-10: 15 mL via OROMUCOSAL

## 2019-10-10 MED ORDER — PROPOFOL 10 MG/ML IV BOLUS
INTRAVENOUS | Status: AC
  Filled 2019-10-10: qty 20

## 2019-10-10 MED ORDER — METHOCARBAMOL 500 MG IVPB - SIMPLE MED
INTRAVENOUS | Status: AC
  Administered 2019-10-10: 500 mg via INTRAVENOUS
  Filled 2019-10-10: qty 50

## 2019-10-10 MED ORDER — ACETAMINOPHEN 10 MG/ML IV SOLN: 1000.0000 mg | Freq: Once | INTRAVENOUS | Status: DC | PRN

## 2019-10-10 MED ORDER — SODIUM CHLORIDE 0.9 % IR SOLN
Status: DC | PRN
  Administered 2019-10-10: 1000 mL

## 2019-10-10 MED ORDER — ONDANSETRON HCL 4 MG PO TABS: 4.0000 mg | ORAL_TABLET | Freq: Four times a day (QID) | ORAL | Status: DC | PRN

## 2019-10-10 MED ORDER — METHOCARBAMOL 500 MG PO TABS
500.0000 mg | ORAL_TABLET | Freq: Four times a day (QID) | ORAL | Status: DC | PRN
  Administered 2019-10-10 – 2019-10-12 (×3): 500 mg via ORAL
  Filled 2019-10-10 (×3): qty 1

## 2019-10-10 MED ORDER — PROPOFOL 1000 MG/100ML IV EMUL
INTRAVENOUS | Status: AC
  Filled 2019-10-10: qty 100

## 2019-10-10 MED ORDER — DEXAMETHASONE SODIUM PHOSPHATE 10 MG/ML IJ SOLN
10.0000 mg | Freq: Once | INTRAMUSCULAR | Status: AC
  Administered 2019-10-10: 6 mg via INTRAVENOUS

## 2019-10-10 MED ORDER — FENTANYL CITRATE (PF) 100 MCG/2ML IJ SOLN
25.0000 ug | INTRAMUSCULAR | Status: DC | PRN
  Administered 2019-10-10: 50 ug via INTRAVENOUS
  Administered 2019-10-10: 25 ug via INTRAVENOUS

## 2019-10-10 MED ORDER — ALUM & MAG HYDROXIDE-SIMETH 200-200-20 MG/5ML PO SUSP: 15.0000 mL | ORAL | Status: DC | PRN

## 2019-10-10 MED ORDER — PROPOFOL 10 MG/ML IV BOLUS
INTRAVENOUS | Status: DC | PRN
  Administered 2019-10-10: 20 mg via INTRAVENOUS

## 2019-10-10 MED ORDER — ACETAMINOPHEN 325 MG PO TABS: 325.0000 mg | ORAL_TABLET | Freq: Four times a day (QID) | ORAL | Status: DC | PRN

## 2019-10-10 MED ORDER — ASPIRIN 81 MG PO CHEW
81.0000 mg | CHEWABLE_TABLET | Freq: Two times a day (BID) | ORAL | Status: DC
  Administered 2019-10-10 – 2019-10-12 (×4): 81 mg via ORAL
  Filled 2019-10-10 (×4): qty 1

## 2019-10-10 MED ORDER — ORAL CARE MOUTH RINSE: 15.0000 mL | Freq: Once | OROMUCOSAL | Status: AC

## 2019-10-10 MED ORDER — FENTANYL CITRATE (PF) 100 MCG/2ML IJ SOLN
INTRAMUSCULAR | Status: AC
  Filled 2019-10-10: qty 2

## 2019-10-10 MED ORDER — MIDAZOLAM HCL 5 MG/5ML IJ SOLN
INTRAMUSCULAR | Status: DC | PRN
  Administered 2019-10-10: .5 mg via INTRAVENOUS

## 2019-10-10 MED ORDER — MAGNESIUM CITRATE PO SOLN: 1.0000 | Freq: Once | ORAL | Status: DC | PRN

## 2019-10-10 MED ORDER — ONDANSETRON HCL 4 MG/2ML IJ SOLN
INTRAMUSCULAR | Status: AC
  Filled 2019-10-10: qty 2

## 2019-10-10 MED ORDER — HYDROCODONE-ACETAMINOPHEN 7.5-325 MG PO TABS
1.0000 | ORAL_TABLET | ORAL | Status: DC | PRN
  Administered 2019-10-10: 1 via ORAL

## 2019-10-10 MED ORDER — FENTANYL CITRATE (PF) 100 MCG/2ML IJ SOLN
INTRAMUSCULAR | Status: AC
  Administered 2019-10-10: 50 ug via INTRAVENOUS
  Filled 2019-10-10: qty 2

## 2019-10-10 MED ORDER — TRANEXAMIC ACID-NACL 1000-0.7 MG/100ML-% IV SOLN
1000.0000 mg | Freq: Once | INTRAVENOUS | Status: AC
  Administered 2019-10-10: 1000 mg via INTRAVENOUS
  Filled 2019-10-10: qty 100

## 2019-10-10 MED ORDER — ONDANSETRON HCL 4 MG/2ML IJ SOLN
4.0000 mg | Freq: Four times a day (QID) | INTRAMUSCULAR | Status: DC | PRN
  Administered 2019-10-10: 4 mg via INTRAVENOUS
  Filled 2019-10-10: qty 2

## 2019-10-10 MED ORDER — DOCUSATE SODIUM 100 MG PO CAPS
100.0000 mg | ORAL_CAPSULE | Freq: Two times a day (BID) | ORAL | Status: DC
  Administered 2019-10-10 – 2019-10-12 (×4): 100 mg via ORAL
  Filled 2019-10-10 (×4): qty 1

## 2019-10-10 MED ORDER — HYDROCODONE-ACETAMINOPHEN 7.5-325 MG PO TABS
ORAL_TABLET | ORAL | Status: AC
  Filled 2019-10-10: qty 1

## 2019-10-10 MED ORDER — FENTANYL CITRATE (PF) 100 MCG/2ML IJ SOLN
INTRAMUSCULAR | Status: DC | PRN
  Administered 2019-10-10 (×2): 50 ug via INTRAVENOUS

## 2019-10-10 MED ORDER — HYDROCODONE-ACETAMINOPHEN 5-325 MG PO TABS: 1.0000 | ORAL_TABLET | ORAL | Status: DC | PRN

## 2019-10-10 MED ORDER — METOCLOPRAMIDE HCL 5 MG PO TABS: 5.0000 mg | ORAL_TABLET | Freq: Three times a day (TID) | ORAL | Status: DC | PRN

## 2019-10-10 MED ORDER — MIDAZOLAM HCL 2 MG/2ML IJ SOLN
INTRAMUSCULAR | Status: AC
  Filled 2019-10-10: qty 2

## 2019-10-10 MED ORDER — PHENYLEPHRINE HCL-NACL 10-0.9 MG/250ML-% IV SOLN
INTRAVENOUS | Status: DC | PRN
  Administered 2019-10-10: 25 ug/min via INTRAVENOUS

## 2019-10-10 MED ORDER — FENTANYL CITRATE (PF) 100 MCG/2ML IJ SOLN
INTRAMUSCULAR | Status: AC
  Administered 2019-10-10: 25 ug via INTRAVENOUS
  Filled 2019-10-10: qty 2

## 2019-10-10 MED ORDER — MENTHOL 3 MG MT LOZG
1.0000 | LOZENGE | OROMUCOSAL | Status: DC | PRN
  Administered 2019-10-10: 3 mg via ORAL
  Filled 2019-10-10: qty 9

## 2019-10-10 MED ORDER — SODIUM CHLORIDE 0.9 % IV SOLN: INTRAVENOUS | Status: DC

## 2019-10-10 MED ORDER — LACTATED RINGERS IV SOLN: INTRAVENOUS | Status: DC

## 2019-10-10 MED ORDER — DEXAMETHASONE SODIUM PHOSPHATE 10 MG/ML IJ SOLN
INTRAMUSCULAR | Status: AC
  Filled 2019-10-10: qty 1

## 2019-10-10 MED ORDER — BISACODYL 10 MG RE SUPP
10.0000 mg | Freq: Every day | RECTAL | Status: DC | PRN
  Administered 2019-10-12: 10 mg via RECTAL
  Filled 2019-10-10: qty 1

## 2019-10-10 MED ORDER — CELECOXIB 200 MG PO CAPS
200.0000 mg | ORAL_CAPSULE | Freq: Two times a day (BID) | ORAL | Status: DC
  Administered 2019-10-10 – 2019-10-12 (×4): 200 mg via ORAL
  Filled 2019-10-10 (×4): qty 1

## 2019-10-10 MED ORDER — TRANEXAMIC ACID-NACL 1000-0.7 MG/100ML-% IV SOLN
1000.0000 mg | INTRAVENOUS | Status: AC
  Administered 2019-10-10: 1000 mg via INTRAVENOUS

## 2019-10-10 MED ORDER — PHENYLEPHRINE HCL (PRESSORS) 10 MG/ML IV SOLN
INTRAVENOUS | Status: AC
  Filled 2019-10-10: qty 1

## 2019-10-10 MED ORDER — PROPOFOL 500 MG/50ML IV EMUL
INTRAVENOUS | Status: DC | PRN
  Administered 2019-10-10: 75 ug/kg/min via INTRAVENOUS

## 2019-10-10 MED ORDER — CEFAZOLIN SODIUM-DEXTROSE 2-4 GM/100ML-% IV SOLN
INTRAVENOUS | Status: AC
  Administered 2019-10-10: 2000 mg
  Filled 2019-10-10: qty 100

## 2019-10-10 MED ORDER — DEXAMETHASONE SODIUM PHOSPHATE 10 MG/ML IJ SOLN
10.0000 mg | Freq: Once | INTRAMUSCULAR | Status: AC
  Administered 2019-10-11: 10 mg via INTRAVENOUS
  Filled 2019-10-10: qty 1

## 2019-10-10 MED ORDER — FERROUS SULFATE 325 (65 FE) MG PO TABS
325.0000 mg | ORAL_TABLET | Freq: Three times a day (TID) | ORAL | Status: DC
  Administered 2019-10-11 – 2019-10-12 (×5): 325 mg via ORAL
  Filled 2019-10-10 (×4): qty 1

## 2019-10-10 MED ORDER — POLYETHYLENE GLYCOL 3350 17 G PO PACK
17.0000 g | PACK | Freq: Two times a day (BID) | ORAL | Status: DC
  Administered 2019-10-11 – 2019-10-12 (×2): 17 g via ORAL
  Filled 2019-10-10 (×4): qty 1

## 2019-10-10 MED ORDER — ONDANSETRON HCL 4 MG/2ML IJ SOLN
INTRAMUSCULAR | Status: DC | PRN
  Administered 2019-10-10: 4 mg via INTRAVENOUS

## 2019-10-10 SURGICAL SUPPLY — 49 items
ADH SKN CLS APL DERMABOND .7 (GAUZE/BANDAGES/DRESSINGS) ×1
BAG DECANTER FOR FLEXI CONT (MISCELLANEOUS) IMPLANT
BAG SPEC THK2 15X12 ZIP CLS (MISCELLANEOUS)
BAG ZIPLOCK 12X15 (MISCELLANEOUS) IMPLANT
BLADE SAG 18X100X1.27 (BLADE) ×2 IMPLANT
BLADE SURG SZ10 CARB STEEL (BLADE) ×4 IMPLANT
COVER PERINEAL POST (MISCELLANEOUS) ×2 IMPLANT
COVER SURGICAL LIGHT HANDLE (MISCELLANEOUS) ×2 IMPLANT
COVER WAND RF STERILE (DRAPES) ×1 IMPLANT
CUP ACET PINNACLE SECTR 50MM (Hips) IMPLANT
DERMABOND ADVANCED (GAUZE/BANDAGES/DRESSINGS) ×1
DERMABOND ADVANCED .7 DNX12 (GAUZE/BANDAGES/DRESSINGS) ×1 IMPLANT
DRAPE STERI IOBAN 125X83 (DRAPES) ×2 IMPLANT
DRAPE U-SHAPE 47X51 STRL (DRAPES) ×4 IMPLANT
DRESSING AQUACEL AG SP 3.5X10 (GAUZE/BANDAGES/DRESSINGS) ×1 IMPLANT
DRSG AQUACEL AG SP 3.5X10 (GAUZE/BANDAGES/DRESSINGS) ×2
DURAPREP 26ML APPLICATOR (WOUND CARE) ×2 IMPLANT
ELECT REM PT RETURN 15FT ADLT (MISCELLANEOUS) ×2 IMPLANT
ELIMINATOR HOLE APEX DEPUY (Hips) ×1 IMPLANT
GLOVE BIO SURGEON STRL SZ 6 (GLOVE) ×4 IMPLANT
GLOVE BIOGEL PI IND STRL 6.5 (GLOVE) ×1 IMPLANT
GLOVE BIOGEL PI IND STRL 7.5 (GLOVE) ×1 IMPLANT
GLOVE BIOGEL PI IND STRL 8.5 (GLOVE) ×1 IMPLANT
GLOVE BIOGEL PI INDICATOR 6.5 (GLOVE) ×1
GLOVE BIOGEL PI INDICATOR 7.5 (GLOVE) ×1
GLOVE BIOGEL PI INDICATOR 8.5 (GLOVE) ×1
GLOVE ECLIPSE 8.0 STRL XLNG CF (GLOVE) ×4 IMPLANT
GLOVE ORTHO TXT STRL SZ7.5 (GLOVE) ×4 IMPLANT
GOWN STRL REUS W/TWL LRG LVL3 (GOWN DISPOSABLE) ×4 IMPLANT
GOWN STRL REUS W/TWL XL LVL3 (GOWN DISPOSABLE) ×2 IMPLANT
HEAD FEM STD 32X+1 STRL (Hips) ×1 IMPLANT
HOLDER FOLEY CATH W/STRAP (MISCELLANEOUS) ×2 IMPLANT
KIT TURNOVER KIT A (KITS) IMPLANT
LINER ACET PNNCL PLUS4 NEUTRAL (Hips) IMPLANT
PACK ANTERIOR HIP CUSTOM (KITS) ×2 IMPLANT
PENCIL SMOKE EVACUATOR (MISCELLANEOUS) IMPLANT
PINNACLE PLUS 4 NEUTRAL (Hips) ×2 IMPLANT
PINNACLE SECTOR CUP 50MM (Hips) ×2 IMPLANT
SCREW 6.5MMX25MM (Screw) ×1 IMPLANT
STEM FEM ACTIS HIGH SZ1 (Stem) ×1 IMPLANT
SUT MNCRL AB 4-0 PS2 18 (SUTURE) ×2 IMPLANT
SUT STRATAFIX 0 PDS 27 VIOLET (SUTURE) ×2
SUT VIC AB 1 CT1 36 (SUTURE) ×6 IMPLANT
SUT VIC AB 2-0 CT1 27 (SUTURE) ×4
SUT VIC AB 2-0 CT1 TAPERPNT 27 (SUTURE) ×2 IMPLANT
SUTURE STRATFX 0 PDS 27 VIOLET (SUTURE) ×1 IMPLANT
TRAY FOLEY MTR SLVR 14FR STAT (SET/KITS/TRAYS/PACK) ×1 IMPLANT
WATER STERILE IRR 1000ML POUR (IV SOLUTION) ×2 IMPLANT
YANKAUER SUCT BULB TIP 10FT TU (MISCELLANEOUS) IMPLANT

## 2019-10-10 NOTE — Op Note (Signed)
NAME:  Alexis Hines                ACCOUNT NO.: [id number]      MEDICAL RECORD NO.: [id number]      FACILITY:  [facility]      PHYSICIAN:  Mauri Pole  DATE OF BIRTH:  Oct 15, 1933     DATE OF PROCEDURE:  10/10/2019                                 OPERATIVE REPORT         PREOPERATIVE DIAGNOSIS: Right  hip osteoarthritis.      POSTOPERATIVE DIAGNOSIS:  Right hip osteoarthritis.      PROCEDURE:  Right total hip replacement through an anterior approach   utilizing DePuy THR system, component size 50 mm pinnacle cup, a size 32+4 neutral   Altrex liner, a size 1 Hi Actis stem with a 32+1 Articuleze metal head ball.      SURGEON:  Pietro Cassis. Alvan Dame, M.D.      ASSISTANT:  Danae Orleans, PA-C     ANESTHESIA:  Spinal.      SPECIMENS:  None.      COMPLICATIONS:  None.      BLOOD LOSS:  100 cc     DRAINS:  None.      INDICATION OF THE PROCEDURE:  Alexis Hines is a 86 y.o. female who had   presented to office for evaluation of right hip pain.  Radiographs revealed   progressive degenerative changes with bone-on-bone   articulation of the  hip joint, including subchondral cystic changes and osteophytes.  The patient had painful limited range of   motion significantly affecting their overall quality of life and function.  The patient was failing to    respond to conservative measures including medications and/or injections and activity modification and at this point was ready   to proceed with more definitive measures.  Consent was obtained for   benefit of pain relief.  Specific risks of infection, DVT, component   failure, dislocation, neurovascular injury, and need for revision surgery were reviewed in the office as well discussion of   the anterior versus posterior approach were reviewed.     PROCEDURE IN DETAIL:  The patient was brought to operative theater.   Once adequate anesthesia, preoperative antibiotics, 2 gm of Ancef, 1 gm of Tranexamic Acid,  and 10 mg of Decadron were administered, the patient was positioned supine on the [employer] table.  Once the patient was safely positioned with adequate padding of boney prominences we predraped out the hip, and used fluoroscopy to confirm orientation of the pelvis.      The right hip was then prepped and draped from proximal iliac crest to   mid thigh with a shower curtain technique.      Time-out was performed identifying the patient, planned procedure, and the appropriate extremity.     An incision was then made 2 cm lateral to the   anterior superior iliac spine extending over the orientation of the   tensor fascia lata muscle and sharp dissection was carried down to the   fascia of the muscle.      The fascia was then incised.  The muscle belly was identified and swept   laterally and retractor placed along the superior neck.  Following   cauterization of the circumflex vessels and removing some pericapsular  fat, a second cobra retractor was placed on the inferior neck.  A T-capsulotomy was made along the line of the   superior neck to the trochanteric fossa, then extended proximally and   distally.  Tag sutures were placed and the retractors were then placed   intracapsular.  We then identified the trochanteric fossa and   orientation of my neck cut and then made a neck osteotomy with the femur on traction.  The femoral   head was removed without difficulty or complication.  Traction was let   off and retractors were placed posterior and anterior around the   acetabulum.      The labrum and foveal tissue were debrided.  I began reaming with a 44 mm   reamer and reamed up to 49 mm reamer with good bony bed preparation and a 50 mm  cup was chosen.  The final 50 mm Pinnacle cup was then impacted under fluoroscopy to confirm the depth of penetration and orientation with respect to   Abduction and forward flexion.  A screw was placed into the ilium followed by the hole eliminator.  The  final   32+4 neutral Altrex liner was impacted with good visualized rim fit.  The cup was positioned anatomically within the acetabular portion of the pelvis.      At this point, the femur was rolled to 100 degrees.  Further capsule was   released off the inferior aspect of the femoral neck.  I then   released the superior capsule proximally.  With the leg in a neutral position the hook was placed laterally   along the femur under the vastus lateralis origin and elevated manually and then held in position using the hook attachment on the bed.  The leg was then extended and adducted with the leg rolled to 100   degrees of external rotation.  Retractors were placed along the medial calcar and posteriorly over the greater trochanter.  Once the proximal femur was fully   exposed, I used a box osteotome to set orientation.  I then began   broaching with the starting chili pepper broach and passed this by hand and then broached up to 1.  With the 1 broach in place I chose a high offset neck and did several trial reductions.  The offset was appropriate, leg lengths   appeared to be equal best matched with the +1 head ball trial confirmed radiographically.   Given these findings, I went ahead and dislocated the hip, repositioned all   retractors and positioned the right hip in the extended and abducted position.  The final 1 Hi Actis stem was   chosen and it was impacted down to the level of neck cut.  Based on this   and the trial reductions, a final 32+1 Articlueze metal head ball was chosen and   impacted onto a clean and dry trunnion, and the hip was reduced.  The   hip had been irrigated throughout the case again at this point.  I did   reapproximate the superior capsular leaflet to the anterior leaflet   using #1 Vicryl.  The fascia of the   tensor fascia lata muscle was then reapproximated using #1 Vicryl and #0 Stratafix sutures.  The   remaining wound was closed with 2-0 Vicryl and running 4-0  Monocryl.   The hip was cleaned, dried, and dressed sterilely using Dermabond and   Aquacel dressing.  The patient was then brought   to recovery room  in stable condition tolerating the procedure well.    Danae Orleans, PA-C was present for the entirety of the case involved from   preoperative positioning, perioperative retractor management, general   facilitation of the case, as well as primary wound closure as assistant.            Pietro Cassis Alvan Dame, M.D.        10/10/2019 1:56 PM

## 2019-10-10 NOTE — Anesthesia Procedure Notes (Signed)
Procedure Name: MAC Date/Time: 10/10/2019 1:26 PM Performed by: Lissa Morales, CRNA Pre-anesthesia Checklist: Patient identified, Emergency Drugs available, Suction available, Patient being monitored and Timeout performed Patient Re-evaluated:Patient Re-evaluated prior to induction Oxygen Delivery Method: Simple face mask Placement Confirmation: positive ETCO2

## 2019-10-10 NOTE — Discharge Instructions (Signed)

## 2019-10-10 NOTE — Transfer of Care (Signed)
Immediate Anesthesia Transfer of Care Note  Patient: Darylene Price Calkin  Procedure(s) Performed: TOTAL HIP ARTHROPLASTY ANTERIOR APPROACH (Right Hip)  Patient Location: PACU  Anesthesia Type:Spinal  Level of Consciousness: drowsy and patient cooperative  Airway & Oxygen Therapy: Patient Spontanous Breathing and Patient connected to face mask oxygen  Post-op Assessment: Report given to RN and Post -op Vital signs reviewed and stable  Post vital signs: stable  Last Vitals:  Vitals Value Taken Time  BP 133/67 10/10/19 1517  Temp    Pulse 61 10/10/19 1523  Resp 14 10/10/19 1523  SpO2 100 % 10/10/19 1523  Vitals shown include unvalidated device data.  Last Pain:  Vitals:   10/10/19 1249  TempSrc:   PainSc: 0-No pain         Complications: No complications documented.

## 2019-10-10 NOTE — Anesthesia Postprocedure Evaluation (Signed)
Anesthesia Post Note  Patient: Alexis Hines  Procedure(s) Performed: TOTAL HIP ARTHROPLASTY ANTERIOR APPROACH (Right Hip)     Patient location during evaluation: PACU Anesthesia Type: Spinal Level of consciousness: awake and sedated Pain management: pain level controlled Vital Signs Assessment: post-procedure vital signs reviewed and stable Respiratory status: spontaneous breathing Cardiovascular status: stable Postop Assessment: no headache, no backache, spinal receding, patient able to bend at knees and no apparent nausea or vomiting Anesthetic complications: no   No complications documented.  Last Vitals:  Vitals:   10/10/19 1615 10/10/19 1630  BP: (!) 154/83 (!) 154/73  Pulse: (!) 52 (!) 52  Resp: 15 18  Temp: (!) 35.6 C   SpO2: 100% 100%    Last Pain:  Vitals:   10/10/19 1249  TempSrc:   PainSc: 0-No pain                 Huston Foley

## 2019-10-10 NOTE — Interval H&P Note (Signed)
History and Physical Interval Note:  10/10/2019 12:23 PM  Alexis Hines  has presented today for surgery, with the diagnosis of Right hip osteoarthritis.  The various methods of treatment have been discussed with the patient and family. After consideration of risks, benefits and other options for treatment, the patient has consented to  Procedure(s) with comments: TOTAL HIP ARTHROPLASTY ANTERIOR APPROACH (Right) - 70 mins as a surgical intervention.  The patient's history has been reviewed, patient examined, no change in status, stable for surgery.  I have reviewed the patient's chart and labs.  Questions were answered to the patient's satisfaction.     Mauri Pole

## 2019-10-10 NOTE — Anesthesia Procedure Notes (Signed)
Spinal  Patient location during procedure: OR End time: 10/10/2019 1:33 PM Staffing Performed: resident/CRNA  Resident/CRNA: Lissa Morales, CRNA Preanesthetic Checklist Completed: patient identified, IV checked, site marked, risks and benefits discussed, surgical consent, monitors and equipment checked, pre-op evaluation and timeout performed Spinal Block Patient position: sitting Prep: DuraPrep Patient monitoring: heart rate, continuous pulse ox and blood pressure Approach: midline Location: L3-4 Injection technique: single-shot Needle Needle type: Pencan  Needle gauge: 24 G Needle length: 9 cm

## 2019-10-11 ENCOUNTER — Encounter (HOSPITAL_COMMUNITY): Payer: Self-pay | Admitting: Orthopedic Surgery

## 2019-10-11 DIAGNOSIS — M1611 Unilateral primary osteoarthritis, right hip: Secondary | ICD-10-CM | POA: Diagnosis not present

## 2019-10-11 DIAGNOSIS — E663 Overweight: Secondary | ICD-10-CM | POA: Diagnosis present

## 2019-10-11 LAB — BASIC METABOLIC PANEL
Anion gap: 13 (ref 5–15)
BUN: 16 mg/dL (ref 8–23)
CO2: 26 mmol/L (ref 22–32)
Calcium: 8.7 mg/dL — ABNORMAL LOW (ref 8.9–10.3)
Chloride: 100 mmol/L (ref 98–111)
Creatinine, Ser: 0.89 mg/dL (ref 0.44–1.00)
GFR calc Af Amer: >60 mL/min (ref >60)
GFR calc non Af Amer: 60 mL/min — ABNORMAL LOW (ref >60)
Glucose, Bld: 155 mg/dL — ABNORMAL HIGH (ref 70–99)
Potassium: 5 mmol/L (ref 3.5–5.1)
Sodium: 139 mmol/L (ref 135–145)

## 2019-10-11 LAB — CBC
HCT: 44 % (ref 36.0–46.0)
Hemoglobin: 13.9 g/dL (ref 12.0–15.0)
MCH: 29.9 pg (ref 26.0–34.0)
MCHC: 31.6 g/dL (ref 30.0–36.0)
MCV: 94.6 fL (ref 80.0–100.0)
Platelets: 243 10*3/uL (ref 150–400)
RBC: 4.65 MIL/uL (ref 3.87–5.11)
RDW: 12.6 % (ref 11.5–15.5)
WBC: 9.8 10*3/uL (ref 4.0–10.5)
nRBC: 0 % (ref 0.0–0.2)

## 2019-10-11 MED ORDER — POLYETHYLENE GLYCOL 3350 17 G PO PACK
17.0000 g | PACK | Freq: Two times a day (BID) | ORAL | 0 refills | Status: DC
Start: 2019-10-11 — End: 2020-02-29

## 2019-10-11 MED ORDER — HYDROCODONE-ACETAMINOPHEN 5-325 MG PO TABS: 1.0000 | ORAL_TABLET | ORAL | 0 refills | Status: DC | PRN

## 2019-10-11 MED ORDER — ASPIRIN 81 MG PO CHEW: 81.0000 mg | CHEWABLE_TABLET | Freq: Two times a day (BID) | ORAL | 0 refills | Status: AC

## 2019-10-11 MED ORDER — METHOCARBAMOL 500 MG PO TABS: 500.0000 mg | ORAL_TABLET | Freq: Four times a day (QID) | ORAL | 0 refills | Status: DC | PRN

## 2019-10-11 MED ORDER — DOCUSATE SODIUM 100 MG PO CAPS
100.0000 mg | ORAL_CAPSULE | Freq: Two times a day (BID) | ORAL | 0 refills | Status: DC
Start: 2019-10-11 — End: 2021-08-06

## 2019-10-11 MED ORDER — FERROUS SULFATE 325 (65 FE) MG PO TABS: 325.0000 mg | ORAL_TABLET | Freq: Three times a day (TID) | ORAL | 0 refills | Status: DC

## 2019-10-11 NOTE — Evaluation (Signed)
Physical Therapy Evaluation Patient Details Name: Alexis Hines MRN: [id number] DOB: 1933-08-25 Today's Date: 10/11/2019   History of Present Illness  Pt is an 86 year old female s/p Right Direct Anterior THA with PMHx significant for bil TKAs, skin cancer and PACs (2021)  Clinical Impression  Pt is s/p THA resulting in the deficits listed below (see PT Problem List).  Pt will benefit from skilled PT to increase their independence and safety with mobility to allow discharge to the venue listed below.  Pt assisted OOB and ambulated short distance in hallway.  Pt also performed LE exercises in recliner.  Pt to d/c home tomorrow and plans to have assist from daughter.      Follow Up Recommendations Follow surgeon's recommendation for DC plan and follow-up therapies    Equipment Recommendations  Rolling walker with 5" wheels (youth)    Recommendations for Other Services       Precautions / Restrictions Precautions Precautions: Fall Restrictions Weight Bearing Restrictions: No      Mobility  Bed Mobility Overal bed mobility: Needs Assistance Bed Mobility: Supine to Sit     Supine to sit: Min assist;HOB elevated     General bed mobility comments: assist for R LE for pain control, HOB elevated fully per pt request  Transfers Overall transfer level: Needs assistance Equipment used: Rolling walker (2 wheeled) Transfers: Sit to/from Stand Sit to Stand: Min assist         General transfer comment: verbal cues for UE and LE positioning, assist to rise and steady  Ambulation/Gait Ambulation/Gait assistance: Min assist;Min guard Gait Distance (Feet): 120 Feet Assistive device: Rolling walker (2 wheeled) Gait Pattern/deviations: Step-to pattern;Decreased stance time - right;Antalgic     General Gait Details: verbal cues for sequence, RW positioning, step length, posture; distance to tolerance  Stairs            Wheelchair Mobility    Modified Rankin (Stroke  Patients Only)       Balance                                             Pertinent Vitals/Pain Pain Assessment: 0-10 Pain Score: 4  Pain Location: right hip Pain Descriptors / Indicators: Aching;Sore Pain Intervention(s): Repositioned;Monitored during session    Home Living Family/patient expects to be discharged to:: Private residence Living Arrangements: Alone Available Help at Discharge: Family;Available 24 hours/day (pt reports daughter to assist upon d/c) Type of Home: House Home Access: Stairs to enter Entrance Stairs-Rails: None Entrance Stairs-Number of Steps: 1+1 Home Layout: One level Home Equipment: Environmental consultant - 2 wheels Additional Comments: walker was her husbands    Prior Function Level of Independence: Independent               Hand Dominance        Extremity/Trunk Assessment        Lower Extremity Assessment Lower Extremity Assessment: RLE deficits/detail RLE Deficits / Details: anticipated post op hip weakness, able to perform ankle pumps, grossly 2+/5 hip       Communication   Communication: HOH  Cognition Arousal/Alertness: Awake/alert Behavior During Therapy: WFL for tasks assessed/performed Overall Cognitive Status: Within Functional Limits for tasks assessed  General Comments      Exercises Total Joint Exercises Ankle Circles/Pumps: AROM;Both;10 reps Quad Sets: AROM;Both;10 reps Short Arc QuadBarbaraann Boys;Right;10 reps Heel Slides: AAROM;Right;10 reps Hip ABduction/ADduction: AAROM;Right;10 reps   Assessment/Plan    PT Assessment Patient needs continued PT services  PT Problem List Decreased strength;Decreased mobility;Decreased balance;Decreased activity tolerance;Decreased knowledge of use of DME;Pain       PT Treatment Interventions Stair training;Gait training;DME instruction;Therapeutic exercise;Functional mobility training;Therapeutic  activities;Patient/family education    PT Goals (Current goals can be found in the Care Plan section)  Acute Rehab PT Goals PT Goal Formulation: With patient Time For Goal Achievement: 10/16/19 Potential to Achieve Goals: Good    Frequency 7X/week   Barriers to discharge        Co-evaluation               AM-PAC PT "6 Clicks" Mobility  Outcome Measure Help needed turning from your back to your side while in a flat bed without using bedrails?: A Little Help needed moving from lying on your back to sitting on the side of a flat bed without using bedrails?: A Little Help needed moving to and from a bed to a chair (including a wheelchair)?: A Little Help needed standing up from a chair using your arms (e.g., wheelchair or bedside chair)?: A Little Help needed to walk in hospital room?: A Little Help needed climbing 3-5 steps with a railing? : A Little 6 Click Score: 18    End of Session Equipment Utilized During Treatment: Gait belt Activity Tolerance: Patient tolerated treatment well Patient left: in chair;with call bell/phone within reach;with chair alarm set Nurse Communication: Mobility status PT Visit Diagnosis: Other abnormalities of gait and mobility (R26.89)    Time: 0865-7846 PT Time Calculation (min) (ACUTE ONLY): 29 min   Charges:   PT Evaluation $PT Eval Low Complexity: 1 Low PT Treatments $Therapeutic Exercise: 8-22 mins       Thomasene Mohair PT, DPT Acute Rehabilitation Services Pager: [phone] Office: [phone]  Khristen Cheyney,KATHrine E 10/11/2019, 11:20 AM

## 2019-10-11 NOTE — TOC Transition Note (Signed)
Transition of Care [facility]) - CM/SW Discharge Note   Patient Details  Name: Alexis Hines MRN: [id number] Date of Birth: 28-May-1933  [facility] CM/SW Contact:  Lia Hopping, [place] Phone Number: 10/11/2019, 4:36 PM   Clinical Narrative:    Patient admitted for a total hip arthroplasty. Re: Home Health orders.  CSW met with the patient at beside to discuss home health options. Patient chose [facility] and/or [facility] but unfortunately the agency cannot see the patient due to staffing issues. Patient third choice [facility] will provide PT services, information written on AVS.  [facility] delivered a 3 in 1and RW to the patient beside    Final next level of care: [place] Barriers to Discharge: No Barriers Identified   Patient Goals and CMS Choice   CMS Medicare.gov Compare Post Acute Care list provided to:: Patient Choice offered to / list presented to : Patient  Discharge Placement                       Discharge Plan and Services                DME Arranged: 3-N-1 DME Agency: [facility] Date DME Agency Contacted: 10/11/19 Time DME Agency Contacted: 0900 Representative spoke with at DME Agency: Ovid Curd HH Arranged: PT [place]: [facility] [place] Date [place]: 10/11/19 Time [place]: 1635 Representative spoke with at [place]: Amy  Social Determinants of Health ([place]) Interventions     Readmission Risk Interventions No flowsheet data found.

## 2019-10-11 NOTE — Progress Notes (Signed)
     Subjective: 1 Day Post-Op Procedure(s) (LRB): TOTAL HIP ARTHROPLASTY ANTERIOR APPROACH (Right)   Patient reports pain as moderate, but increases with weight bearing.  No reported events throughout the night.  Discussed the procedure, findings and expectations moving forward.  Some difficulty getting up and getting to the bathroom this morning with assitance.  Plan for discharge tomorrow due to underlying medical co-morbidities, pain control and need for inpatient therapy to meet goal of being discharged home safely with family/caregiver.      Objective:   VITALS:   Vitals:   10/11/19 0557 10/11/19 0820  BP: 116/60 (!) 111/58  Pulse: 62 (!) 56  Resp: 18 17  Temp: 97.6 F (36.4 C) 97.6 F (36.4 C)  SpO2: 98% 95%    Dorsiflexion/Plantar flexion intact Incision: dressing C/D/I No cellulitis present Compartment soft  LABS Recent Labs    10/11/19 0331  HGB 13.9  HCT 44.0  WBC 9.8  PLT 243    Recent Labs    10/11/19 0331  NA 139  K 5.0  BUN 16  CREATININE 0.89  GLUCOSE 155*     Assessment/Plan: 1 Day Post-Op Procedure(s) (LRB): TOTAL HIP ARTHROPLASTY ANTERIOR APPROACH (Right) Foley cath d/c'ed Advance diet Up with therapy D/C IV fluids Discharge home probably tomorrow   Overweight (BMI 25-29.9) Estimated body mass index is 25.29 kg/m as calculated from the following:   Height as of this encounter: 4\' 11"  (1.499 m).   Weight as of this encounter: 56.8 kg. Patient also counseled that weight may inhibit the healing process Patient counseled that losing weight will help with future health issues       Danae Orleans PA-C  [facility]  [place] [address], [address], [place], [place] [address] Phone: [phone] [email or web address] Facebook  [employer]

## 2019-10-11 NOTE — Progress Notes (Signed)
Physical Therapy Treatment Patient Details Name: Alexis Hines MRN: [id number] DOB: 1933/09/26 Today's Date: 10/11/2019    History of Present Illness Pt is an 86 year old female s/p Right Direct Anterior THA with PMHx significant for bil TKAs, skin cancer and PACs (2021)    PT Comments    Pt ambulated again in hallway.  Provided cues again for sit to stand transfer for pain control as pt reports this causes her the most pain.  Pt's lunch arrived upon return to recliner.  Pt plans to d/c home tomorrow.   Follow Up Recommendations  Follow surgeon's recommendation for DC plan and follow-up therapies     Equipment Recommendations  Rolling walker with 5" wheels (youth)    Recommendations for Other Services       Precautions / Restrictions Precautions Precautions: Fall Restrictions Weight Bearing Restrictions: No    Mobility  Bed Mobility Overal bed mobility: Needs Assistance Bed Mobility: Supine to Sit     Supine to sit: Min assist;HOB elevated     General bed mobility comments: pt in recliner on arrival  Transfers Overall transfer level: Needs assistance Equipment used: Rolling walker (2 wheeled) Transfers: Sit to/from Stand Sit to Stand: Min guard         General transfer comment: verbal cues for UE and LE positioning  Ambulation/Gait Ambulation/Gait assistance: Min guard Gait Distance (Feet): 120 Feet Assistive device: Rolling walker (2 wheeled) Gait Pattern/deviations: Step-to pattern;Decreased stance time - right;Antalgic     General Gait Details: verbal cues for sequence, RW positioning, step length, posture; distance to tolerance   Stairs             Wheelchair Mobility    Modified Rankin (Stroke Patients Only)       Balance                                            Cognition Arousal/Alertness: Awake/alert Behavior During Therapy: WFL for tasks assessed/performed Overall Cognitive Status: Within Functional  Limits for tasks assessed                                           General Comments        Pertinent Vitals/Pain Pain Assessment: 0-10 Pain Score: 5  Pain Location: right hip with transitional movement Pain Descriptors / Indicators: Aching;Sore Pain Intervention(s): Repositioned;Monitored during session    Home Living Family/patient expects to be discharged to:: Private residence Living Arrangements: Alone Available Help at Discharge: Family;Available 24 hours/day (pt reports daughter to assist upon d/c) Type of Home: House Home Access: Stairs to enter Entrance Stairs-Rails: None Home Layout: One level Home Equipment: Environmental consultant - 2 wheels Additional Comments: walker was her husbands    Prior Function Level of Independence: Independent          PT Goals (current goals can now be found in the care plan section) Acute Rehab PT Goals PT Goal Formulation: With patient Time For Goal Achievement: 10/16/19 Potential to Achieve Goals: Good Progress towards PT goals: Progressing toward goals    Frequency    7X/week      PT Plan Current plan remains appropriate    Co-evaluation              AM-PAC PT "6 Clicks" Mobility  Outcome Measure  Help needed turning from your back to your side while in a flat bed without using bedrails?: A Little Help needed moving from lying on your back to sitting on the side of a flat bed without using bedrails?: A Little Help needed moving to and from a bed to a chair (including a wheelchair)?: A Little Help needed standing up from a chair using your arms (e.g., wheelchair or bedside chair)?: A Little Help needed to walk in hospital room?: A Little Help needed climbing 3-5 steps with a railing? : A Little 6 Click Score: 18    End of Session Equipment Utilized During Treatment: Gait belt Activity Tolerance: Patient tolerated treatment well Patient left: in chair;with call bell/phone within reach;with chair alarm  set Nurse Communication: Mobility status PT Visit Diagnosis: Other abnormalities of gait and mobility (R26.89)     Time: 1121-6244 PT Time Calculation (min) (ACUTE ONLY): 13 min  Charges:  $Gait Training: 8-22 mins                     Arlyce Dice, DPT Acute Rehabilitation Services Pager: [phone] Office: [place] 10/11/2019, 2:11 PM

## 2019-10-12 DIAGNOSIS — M1611 Unilateral primary osteoarthritis, right hip: Secondary | ICD-10-CM | POA: Diagnosis not present

## 2019-10-12 LAB — CBC
HCT: 39.3 % (ref 36.0–46.0)
Hemoglobin: 12.5 g/dL (ref 12.0–15.0)
MCH: 29.7 pg (ref 26.0–34.0)
MCHC: 31.8 g/dL (ref 30.0–36.0)
MCV: 93.3 fL (ref 80.0–100.0)
Platelets: 212 10*3/uL (ref 150–400)
RBC: 4.21 MIL/uL (ref 3.87–5.11)
RDW: 12.9 % (ref 11.5–15.5)
WBC: 12.3 10*3/uL — ABNORMAL HIGH (ref 4.0–10.5)
nRBC: 0 % (ref 0.0–0.2)

## 2019-10-12 LAB — BASIC METABOLIC PANEL
Anion gap: 9 (ref 5–15)
BUN: 22 mg/dL (ref 8–23)
CO2: 27 mmol/L (ref 22–32)
Calcium: 8.6 mg/dL — ABNORMAL LOW (ref 8.9–10.3)
Chloride: 104 mmol/L (ref 98–111)
Creatinine, Ser: 0.76 mg/dL (ref 0.44–1.00)
GFR calc Af Amer: >60 mL/min (ref >60)
GFR calc non Af Amer: >60 mL/min (ref >60)
Glucose, Bld: 144 mg/dL — ABNORMAL HIGH (ref 70–99)
Potassium: 4 mmol/L (ref 3.5–5.1)
Sodium: 140 mmol/L (ref 135–145)

## 2019-10-12 MED ORDER — ACETAMINOPHEN 500 MG PO TABS
1000.0000 mg | ORAL_TABLET | Freq: Three times a day (TID) | ORAL | 0 refills | Status: DC
Start: 2019-10-12 — End: 2020-08-07

## 2019-10-12 MED ORDER — OXYCODONE HCL 5 MG PO TABS: 5.0000 mg | ORAL_TABLET | Freq: Four times a day (QID) | ORAL | 0 refills | Status: DC | PRN

## 2019-10-12 NOTE — Progress Notes (Signed)
     Subjective: 2 Days Post-Op Procedure(s) (LRB): TOTAL HIP ARTHROPLASTY ANTERIOR APPROACH (Right)   Patient reports pain as mild, pain controlled. No reported events throughout the night.  Discussed PT today.  Ready to be discharged home, if they do well with PT.  Follow up in the clinic in 2 weeks.  Knows to call with any questions or concerns.      Objective:   VITALS:   Vitals:   10/12/19 0030 10/12/19 0443  BP: 121/66 (!) 114/52  Pulse: 62 61  Resp: 18 18  Temp:  97.8 F (36.6 C)  SpO2: 96% 93%    Dorsiflexion/Plantar flexion intact Incision: dressing C/D/I No cellulitis present Compartment soft  LABS Recent Labs    10/11/19 0331 10/12/19 0315  HGB 13.9 12.5  HCT 44.0 39.3  WBC 9.8 12.3*  PLT 243 212    Recent Labs    10/11/19 0331 10/12/19 0315  NA 139 140  K 5.0 4.0  BUN 16 22  CREATININE 0.89 0.76  GLUCOSE 155* 144*     Assessment/Plan: 2 Days Post-Op Procedure(s) (LRB): TOTAL HIP ARTHROPLASTY ANTERIOR APPROACH (Right) Up with therapy Discharge home with home health  Follow up in 2 weeks at [facility] Follow up with OLIN,Danel Requena D in 2 weeks.  Contact information:  [facility] [address] [place] [phone]             Danae Orleans PA-C  [facility]  [place] [address], [place], [place], [place] [address] Phone: [phone] [email or web address] Facebook  [employer]

## 2019-10-12 NOTE — Progress Notes (Signed)
Physical Therapy Treatment Patient Details Name: Alexis Hines MRN: [id number] DOB: 1934/03/19 Today's Date: 10/12/2019    History of Present Illness Pt is an 86 year old female s/p Right Direct Anterior THA with PMHx significant for bil TKAs, skin cancer and PACs (2021)    PT Comments    Pt ambulated in hallway and practiced safe step technique.  Pt reports understanding.  Pt provided with HEP handout and verbally reviewed exercises.  Pt had no further questions and feels ready for d/c home today.     Follow Up Recommendations  Follow surgeon's recommendation for DC plan and follow-up therapies     Equipment Recommendations  Other (comment) (youth RW in room)    Recommendations for Other Services       Precautions / Restrictions Precautions Precautions: Fall Restrictions Weight Bearing Restrictions: No    Mobility  Bed Mobility Overal bed mobility: Needs Assistance Bed Mobility: Supine to Sit;Sit to Supine     Supine to sit: Min guard Sit to supine: Min guard   General bed mobility comments: cues for self assist  Transfers Overall transfer level: Needs assistance Equipment used: Rolling walker (2 wheeled) Transfers: Sit to/from Stand Sit to Stand: Min guard         General transfer comment: verbal cues for UE and LE positioning  Ambulation/Gait Ambulation/Gait assistance: Min guard Gait Distance (Feet): 240 Feet Assistive device: Rolling walker (2 wheeled) Gait Pattern/deviations: Step-to pattern;Decreased stance time - right;Antalgic     General Gait Details: verbal cues for sequence, RW positioning, step length, posture;   Stairs Stairs: Yes Stairs assistance: Min guard Stair Management: Step to pattern;Forwards;With walker Number of Stairs: 1 General stair comments: verbal cues for sequence and safety; performed twice; pt reports understanding   Wheelchair Mobility    Modified Rankin (Stroke Patients Only)       Balance                                             Cognition Arousal/Alertness: Awake/alert Behavior During Therapy: WFL for tasks assessed/performed Overall Cognitive Status: Within Functional Limits for tasks assessed                                        Exercises    General Comments        Pertinent Vitals/Pain Pain Assessment: 0-10 Pain Score: 4  Pain Location: right hip with transitional movement Pain Descriptors / Indicators: Aching;Sore Pain Intervention(s): Monitored during session;Repositioned;Premedicated before session    Home Living                      Prior Function            PT Goals (current goals can now be found in the care plan section) Progress towards PT goals: Progressing toward goals    Frequency    7X/week      PT Plan Current plan remains appropriate    Co-evaluation              AM-PAC PT "6 Clicks" Mobility   Outcome Measure  Help needed turning from your back to your side while in a flat bed without using bedrails?: A Little Help needed moving from lying on your back to sitting on the side  of a flat bed without using bedrails?: A Little Help needed moving to and from a bed to a chair (including a wheelchair)?: A Little Help needed standing up from a chair using your arms (e.g., wheelchair or bedside chair)?: A Little Help needed to walk in hospital room?: A Little Help needed climbing 3-5 steps with a railing? : A Little 6 Click Score: 18    End of Session Equipment Utilized During Treatment: Gait belt Activity Tolerance: Patient tolerated treatment well Patient left: with call bell/phone within reach;in bed Nurse Communication: Mobility status PT Visit Diagnosis: Other abnormalities of gait and mobility (R26.89)     Time: 7829-5621 PT Time Calculation (min) (ACUTE ONLY): 18 min  Charges:  $Gait Training: 8-22 mins          Arlyce Dice, DPT Acute Rehabilitation Services Pager:  [phone] Office: [phone]  [place] E 10/12/2019, 3:23 PM

## 2019-10-12 NOTE — Progress Notes (Signed)
Patient took her metoprolol 25mg  late last night at 0043 d/t patient tried to contact her son couple times who is a Tax adviser in [place] for advice; pt thinks that her blood pressure and heart rate was too low to administer meds. Pt was explained that VS was stable to administer the meds. Spoke to Son (MD) and informed about the situation. meds was administered at 0043. Will continue to monitor pt.

## 2019-10-12 NOTE — Progress Notes (Signed)
Physical Therapy Treatment Patient Details Name: Alexis Hines MRN: [id number] DOB: [birth year] Today's Date: 10/12/2019    History of Present Illness Pt is an 86 year old female s/p Right Direct Anterior THA with PMHx significant for bil TKAs, skin cancer and PACs (2021)    PT Comments    Pt assisted to bathroom and then ambulated in hallway.  Pt also performed LE exercises.  Pt plans to d/c home after second session today.    Follow Up Recommendations  Follow surgeon's recommendation for DC plan and follow-up therapies     Equipment Recommendations  Other (comment) (youth RW in room)    Recommendations for Other Services       Precautions / Restrictions Precautions Precautions: Fall Restrictions Weight Bearing Restrictions: No    Mobility  Bed Mobility               General bed mobility comments: pt in recliner on arrival  Transfers Overall transfer level: Needs assistance Equipment used: Rolling walker (2 wheeled) Transfers: Sit to/from Stand Sit to Stand: Min guard         General transfer comment: verbal cues for UE and LE positioning  Ambulation/Gait Ambulation/Gait assistance: Min guard Gait Distance (Feet): 120 Feet Assistive device: Rolling walker (2 wheeled) Gait Pattern/deviations: Step-to pattern;Decreased stance time - right;Antalgic     General Gait Details: verbal cues for sequence, RW positioning, step length, posture; distance to tolerance   Stairs             Wheelchair Mobility    Modified Rankin (Stroke Patients Only)       Balance                                            Cognition Arousal/Alertness: Awake/alert Behavior During Therapy: WFL for tasks assessed/performed Overall Cognitive Status: Within Functional Limits for tasks assessed                                        Exercises Total Joint Exercises Ankle Circles/Pumps: AROM;Both;10 reps Quad Sets: AROM;Both;10  reps Heel Slides: AAROM;Right;10 reps Hip ABduction/ADduction: AAROM;Right;10 reps;Supine;Standing Long [employer]: AROM;Seated;Right;10 reps Knee Flexion: AROM;Right;Standing;10 reps Marching in Standing: AROM;Right;Standing;10 reps Standing Hip Extension: AROM;Right;Standing;10 reps    General Comments        Pertinent Vitals/Pain Pain Assessment: 0-10 Pain Score: 4  Pain Location: right hip with transitional movement Pain Descriptors / Indicators: Aching;Sore Pain Intervention(s): Monitored during session;Repositioned    Home Living                      Prior Function            PT Goals (current goals can now be found in the care plan section) Progress towards PT goals: Progressing toward goals    Frequency    7X/week      PT Plan Current plan remains appropriate    Co-evaluation              AM-PAC PT "6 Clicks" Mobility   Outcome Measure  Help needed turning from your back to your side while in a flat bed without using bedrails?: A Little Help needed moving from lying on your back to sitting on the side of a flat bed without using  bedrails?: A Little Help needed moving to and from a bed to a chair (including a wheelchair)?: A Little Help needed standing up from a chair using your arms (e.g., wheelchair or bedside chair)?: A Little Help needed to walk in hospital room?: A Little Help needed climbing 3-5 steps with a railing? : A Little 6 Click Score: 18    End of Session Equipment Utilized During Treatment: Gait belt Activity Tolerance: Patient tolerated treatment well Patient left: in chair;with call bell/phone within reach;with chair alarm set Nurse Communication: Mobility status PT Visit Diagnosis: Other abnormalities of gait and mobility (R26.89)     Time: 1517-6160 PT Time Calculation (min) (ACUTE ONLY): 28 min  Charges:  $Gait Training: 8-22 mins $Therapeutic Exercise: 8-22 mins                     Jannette Spanner PT, DPT Acute  Rehabilitation Services Pager: [phone] Office: [place] 10/12/2019, 11:53 AM

## 2019-10-14 NOTE — Discharge Summary (Signed)
Patient ID: Alexis Hines MRN: [id number] DOB/AGE: December 23, 1933 86 y.o.  Admit date: 10/10/2019 Discharge date: 10/14/2019  Admission Diagnoses:  Principal Problem:   Osteoarthritis of right hip Active Problems:   Status post right hip replacement   Overweight (BMI 25.0-29.9)   Discharge Diagnoses:  Same  Past Medical History:  Diagnosis Date  . Abnormal TSH 2004   normal since  . Arthritis   . Basal cell cancer    nose; Dr. Allyson Sabal   . Diverticulosis 8/07  . Dizziness   . Hypertension   . Migraine childhood   resolved  . PAC (premature atrial contraction)    frequent on cardiac monitor 09/14/19  . Pneumonia   . Pure hypercholesterolemia   . Radial styloid tenosynovitis (de quervain) 11/2018   injected by Dr. Fredna Dow 11/2018, 02/2019  . SCCA (squamous cell carcinoma) of skin 12/2015   right temple  . Squamous cell carcinoma of skin 02/15/2017   in situ-left jaw (CX35FU)    Surgeries: Procedure(s): RIGHT TOTAL HIP ARTHROPLASTY ANTERIOR APPROACH on 10/10/2019   Consultants: N/A  Discharged Condition: Improved  Hospital Course: Alexis Hines is an 86 y.o. female who was admitted 10/10/2019 for operative treatment ofOsteoarthritis of right hip. Patient has severe unremitting pain that affects sleep, daily activities, and work/hobbies. After pre-op clearance the patient was taken to the operating room on 10/10/2019 and underwent  Procedure(s):  RIGHT TOTAL HIP ARTHROPLASTY ANTERIOR APPROACH.    Patient was given perioperative antibiotics:  Anti-infectives (From admission, onward)   Start     Dose/Rate Route Frequency Ordered Stop   10/10/19 1930  ceFAZolin (ANCEF) IVPB 2g/100 mL premix        2 g 200 mL/hr over 30 Minutes Intravenous Every 6 hours 10/10/19 1512 10/11/19 0101   10/10/19 1230  ceFAZolin (ANCEF) IVPB 2g/100 mL premix        2 g 200 mL/hr over 30 Minutes Intravenous On call to O.R. 10/10/19 1211 10/10/19 1334   10/10/19 1218  ceFAZolin (ANCEF) 2-4  GM/100ML-% IVPB       Note to Pharmacy: Marchia Meiers   : cabinet override      10/10/19 1218 10/10/19 2103       Patient was given sequential compression devices, early ambulation, and chemoprophylaxis to prevent DVT.  Patient benefited maximally from hospital stay and there were no complications.    Recent vital signs: No data found.   Recent laboratory studies:  Recent Labs    10/12/19 0315  WBC 12.3*  HGB 12.5  HCT 39.3  PLT 212  NA 140  K 4.0  CL 104  CO2 27  BUN 22  CREATININE 0.76  GLUCOSE 144*  CALCIUM 8.6*     Discharge Medications:   Allergies as of 10/12/2019   No Known Allergies     Medication List    STOP taking these medications   Advil 200 MG tablet Generic drug: ibuprofen   bisacodyl 5 MG EC tablet Commonly known as: DULCOLAX   diclofenac Sodium 1 % Gel Commonly known as: VOLTAREN     TAKE these medications   acetaminophen 500 MG tablet Commonly known as: TYLENOL Take 2 tablets (1,000 mg total) by mouth every 8 (eight) hours.   amoxicillin 500 MG capsule Commonly known as: AMOXIL Take 2,000 mg by mouth once.   aspirin 81 MG chewable tablet Commonly known as: Aspirin Childrens Chew 1 tablet (81 mg total) by mouth 2 (two) times daily. Take for 4 weeks, then resume regular  dose.   atorvastatin 40 MG tablet Commonly known as: LIPITOR Take 1 tablet (40 mg total) by mouth at bedtime.   Biotin 10000 MCG Tabs Take 10,000 mcg by mouth daily.   Caltrate 600+D 600-400 MG-UNIT tablet Generic drug: Calcium Carbonate-Vitamin D Take 1 tablet by mouth daily at 3 pm.   docusate sodium 100 MG capsule Commonly known as: Colace Take 1 capsule (100 mg total) by mouth 2 (two) times daily.   ferrous sulfate 325 (65 FE) MG tablet Commonly known as: FerrouSul Take 1 tablet (325 mg total) by mouth 3 (three) times daily with meals for 14 days.   methocarbamol 500 MG tablet Commonly known as: Robaxin Take 1 tablet (500 mg total) by mouth every 6  (six) hours as needed for muscle spasms.   metoprolol tartrate 25 MG tablet Commonly known as: LOPRESSOR Take 1 tablet (25 mg total) by mouth 2 (two) times daily.   multivitamin with minerals Tabs tablet Take 1 tablet by mouth daily. CENTRUM MULTIVITAMIN   oxyCODONE 5 MG immediate release tablet Commonly known as: Oxy IR/ROXICODONE Take 1-2 tablets (5-10 mg total) by mouth every 6 (six) hours as needed for moderate pain or severe pain.   polyethylene glycol 17 g packet Commonly known as: MIRALAX / GLYCOLAX Take 17 g by mouth 2 (two) times daily.            Discharge Care Instructions  (From admission, onward)         Start     Ordered   10/11/19 0000  Change dressing       Comments: Maintain surgical dressing until follow up in the clinic. If the edges start to pull up, may reinforce with tape. If the dressing is no longer working, may remove and cover with gauze and tape, but must keep the area dry and clean.  Call with any questions or concerns.   10/11/19 0913          Diagnostic Studies: DG Pelvis Portable  Result Date: 10/10/2019 CLINICAL DATA:  Status post right total hip replacement EXAM: PORTABLE PELVIS 1-2 VIEWS COMPARISON:  Intraoperative study October 10, 2019 FINDINGS: Frontal view obtained. There is a total hip replacement on the right with prosthetic components well-seated. No acute fracture or dislocation. There is moderate narrowing of the left hip joint. IMPRESSION: Total hip replacement on the right with prosthetic components well-seated on frontal view. No fracture or dislocation. Moderate osteoarthritic change left hip joint. Electronically Signed   By: Lowella Grip III M.D.   On: 10/10/2019 16:04   DG C-Arm 1-60 Min-No Report  Result Date: 10/10/2019 CLINICAL DATA:  Right hip replacement. EXAM: OPERATIVE RIGHT HIP (WITH PELVIS IF PERFORMED) TECHNIQUE: Fluoroscopic spot image(s) were submitted for interpretation post-operatively. COMPARISON:  None.  FINDINGS: Two fluoroscopic spot views obtained in the operating room during right hip arthroplasty. Femoral stem and acetabular components are present. Total fluoroscopy time 12 seconds. Total dose 1.24 mGy IMPRESSION: Fluoroscopic spot views during right hip arthroplasty. Electronically Signed   By: Keith Rake M.D.   On: 10/10/2019 15:11   DG HIP OPERATIVE UNILAT W OR W/O PELVIS RIGHT  Result Date: 10/10/2019 CLINICAL DATA:  Right hip replacement. EXAM: OPERATIVE RIGHT HIP (WITH PELVIS IF PERFORMED) TECHNIQUE: Fluoroscopic spot image(s) were submitted for interpretation post-operatively. COMPARISON:  None. FINDINGS: Two fluoroscopic spot views obtained in the operating room during right hip arthroplasty. Femoral stem and acetabular components are present. Total fluoroscopy time 12 seconds. Total dose 1.24 mGy IMPRESSION: Fluoroscopic  spot views during right hip arthroplasty. Electronically Signed   By: Keith Rake M.D.   On: 10/10/2019 15:11    Disposition: Home  Discharge Instructions    Call MD / Call 911   Complete by: As directed    If you experience chest pain or shortness of breath, CALL 911 and be transported to the hospital emergency room.  If you develope a fever above 101 F, pus (white drainage) or increased drainage or redness at the wound, or calf pain, call your surgeon's office.   Change dressing   Complete by: As directed    Maintain surgical dressing until follow up in the clinic. If the edges start to pull up, may reinforce with tape. If the dressing is no longer working, may remove and cover with gauze and tape, but must keep the area dry and clean.  Call with any questions or concerns.   Constipation Prevention   Complete by: As directed    Drink plenty of fluids.  Prune juice may be helpful.  You may use a stool softener, such as Colace (over the counter) 100 mg twice a day.  Use MiraLax (over the counter) for constipation as needed.   Diet - low sodium heart  healthy   Complete by: As directed    Discharge instructions   Complete by: As directed    Maintain surgical dressing until follow up in the clinic. If the edges start to pull up, may reinforce with tape. If the dressing is no longer working, may remove and cover with gauze and tape, but must keep the area dry and clean.  Follow up in 2 weeks at [facility]. Call with any questions or concerns.   Increase activity slowly as tolerated   Complete by: As directed    Weight bearing as tolerated with assist device (walker, cane, etc) as directed, use it as long as suggested by your surgeon or therapist, typically at least 4-6 weeks.   TED hose   Complete by: As directed    Use stockings (TED hose) for 2 weeks on both leg(s).  You may remove them at night for sleeping.       Follow-up Information    Paralee Cancel, MD. Schedule an appointment as soon as possible for a visit in 2 weeks.   Specialty: Orthopedic Surgery Contact information: [address] [place] [address] [phone]                Signed: Lucille Passy [facility] 10/14/2019, 10:02 PM

## 2019-11-08 ENCOUNTER — Telehealth: Payer: Self-pay | Admitting: *Deleted

## 2019-11-08 NOTE — Progress Notes (Signed)
Chief Complaint  Patient presents with  . Fatigue    and weakness. Taking 3-4 naps a day. Was supposed to take 3 iron tablets a day for 2 weeks, only took 1 daily. Was wanting to have some labs done and if you are willing to draw labs she was wanting to have lipids and liver drawn as well-she is fasting. Wants to know if she can take advil when she gets a headache. Also can she stop taking 21m aspirin.  . Depression Screening    1   She had a hip replacement 7/13, had her post surgical follow up and is doing well. She is getting physical therapy.   She is still weak, and reports feeling very tired during the day. Patient says she takes 3-4 naps daily. She was supposed to take iron 3 times daily for 2 weeks post op and admits to only taking once daily for the 2 weeks, due to constipation. She called over to surgeon's office and was directed by their nurse to contact PCP for possible labwork. She also wants to have annual lipids/liver done. States she usually has done in Sept. Also wants to know if she is to continue taking 812mASA. Directions stated to take 8125mID x 4 weeks.  She is past 4 weeks, continues to take it BID.  [facility] [place] [facility] 44m54mily.   She is tired during the day.  When asked about her sleep, she reports getting up 2-3x each night to void (since she is drinking more water). She is also getting up with pain in her leg, and taking oxycodone. She takes 1 at night. Sometimes takes it during the day as well, but not daily.   She will doze off if she sits down to read or watch TV She is asking if metoprolol can contribute to her fatigue.  She only notes the fatigue since her surgery (and had started the metoprolol prior to surgery, with no side effects)  Post-op CBC: Lab Results  Component Value Date   WBC 12.3 (H) 10/12/2019   HGB 12.5 10/12/2019   HCT 39.3 10/12/2019   MCV 93.3 10/12/2019   PLT 212 10/12/2019   Prior to surgery she had  cardiac eval--Echocardiogram on 08/31/2019 showed LVEF 70-743-32%ade 1 diastolic function, normal RV function, mild RV enlargement, degenerative mitral valve with mild MR. Zio patch x3 days on 09/14/2019 showed frequent PACs (11% of beats), with frequent episodes of SVT lasting up to 20 seconds. Started on metoprolol 25 mg twice daily and her palpitations improved. Hasn't been checking BP recently, recalls the systolic BP was a little high (recalls seeing up to 150). She can't recall the pulse. She doesn't recall any fatigue prior to her surgery--she was asking if could be a side effect of the medication.  She stated she wants to see the cardiologist sooner than 6 months, as she wants to come off the medication.  She doesn't like taking medications.   PMH, PSH, SH reviewed  Outpatient Encounter Medications as of 11/09/2019  Medication Sig Note  . aspirin (ASPIRIN CHILDRENS) 81 MG chewable tablet Chew 1 tablet (81 mg total) by mouth 2 (two) times daily. Take for 4 weeks, then resume regular dose.   . atMarland Kitchenrvastatin (LIPITOR) 40 MG tablet Take 1 tablet (40 mg total) by mouth at bedtime.   . Biotin 10000 MCG TABS Take 10,000 mcg by mouth daily.   . Calcium Carbonate-Vitamin D (CALTRATE 600+D)  600-400 MG-UNIT per tablet Take 1 tablet by mouth daily at 3 pm.    . docusate sodium (COLACE) 100 MG capsule Take 1 capsule (100 mg total) by mouth 2 (two) times daily.   Marland Kitchen HYDROmorphone (DILAUDID) 2 MG tablet Take 2 mg by mouth every 4 (four) hours as needed for severe pain. 11/09/2019: Had leftover from knee surgery-takes sometimes when she doesn't take the oxycodone  . metoprolol tartrate (LOPRESSOR) 25 MG tablet Take 1 tablet (25 mg total) by mouth 2 (two) times daily.   . Multiple Vitamin (MULTIVITAMIN WITH MINERALS) TABS tablet Take 1 tablet by mouth daily. CENTRUM MULTIVITAMIN   . oxyCODONE (OXY IR/ROXICODONE) 5 MG immediate release tablet Take 1-2 tablets (5-10 mg total) by mouth every 6 (six) hours as needed  for moderate pain or severe pain. 11/09/2019: Taking once a night, occasionally once during the day if needed (not daily)  . acetaminophen (TYLENOL) 500 MG tablet Take 2 tablets (1,000 mg total) by mouth every 8 (eight) hours. (Patient not taking: Reported on 11/09/2019)   . amoxicillin (AMOXIL) 500 MG capsule Take 2,000 mg by mouth once. (Patient not taking: Reported on 11/09/2019) 10/04/2019: Take one hour prior to dental work  . ferrous sulfate (FERROUSUL) 325 (65 FE) MG tablet Take 1 tablet (325 mg total) by mouth 3 (three) times daily with meals for 14 days. (Patient not taking: Reported on 11/09/2019)   . polyethylene glycol (MIRALAX / GLYCOLAX) 17 g packet Take 17 g by mouth 2 (two) times daily. (Patient not taking: Reported on 11/09/2019)   . [DISCONTINUED] methocarbamol (ROBAXIN) 500 MG tablet Take 1 tablet (500 mg total) by mouth every 6 (six) hours as needed for muscle spasms.    No facility-administered encounter medications on file as of 11/09/2019.   No Known Allergies  ROS: no fever, chills, URI symptoms, chest pain, shortness of breath. Constipation--now passing hard marbles. No longer using Miralax, is taking stool softeners. Denies blood in the stool. Denies dysuria, hematuria.  Voiding more frequently due to increased water intake.  PHYSICAL EXAM:  BP 130/80   Pulse 72   Temp 98 F (36.7 C) (Tympanic)   Ht '4\' 11"'  (1.499 m)   Wt 124 lb 6.4 oz (56.4 kg)   BMI 25.13 kg/m   Pleasant, well-appearing female, in no distress. She walks with a walker. HEENT: conjunctiva and sclera are clear, EOMI Neck: no lymphadenopathy or mass Heart: regular rhythm, though frequent ectopy noted (extra/skipped beats) Lungs: clear bilaterally Back: no spinal or CVA tenderness Abdomen: soft, nontender, no mass Extremities: No edema, calves nontender Neuro: alert and oriented Psych: normal mood, affect, hygiene and grooming   ASSESSMENT/PLAN:   Fatigue, unspecified type - suspect related to  interrupted sleep at night, pain meds.  Doubt anemia, since post-op CBC was okay - Plan: CBC with Differential/Platelet, Comprehensive metabolic panel  Pure hypercholesterolemia - on lipitor, due for recheck - Plan: Lipid panel  Medication monitoring encounter - Plan: CBC with Differential/Platelet, Comprehensive metabolic panel, Lipid panel  Drug-induced constipation - pain meds contributing to her constipation.  Cont Colace, add Miralax while needing pain meds  Palpitations - pt asymptomatic.  Still significant ectopy on exam. Reviewed Zio patch results, and recommended she continue on Metoprolol   Counseled re: need for metoprolol, risks. Can consider changing to extended release (by cardiologist, to reduce number of pills), but not recommended that she cut the dose to once daily or stop the med. Explained in detail.   c-met, lipid, CBC Had  normal TSH in 07/2019

## 2019-11-08 NOTE — Telephone Encounter (Signed)
No for labs only.  Needs OV

## 2019-11-08 NOTE — Telephone Encounter (Signed)
Scheduled OV for tomorrow.

## 2019-11-08 NOTE — Telephone Encounter (Signed)
Patient called and she is wanting to come in for labs. She had a hip replacement 7/13, had her post surgical follow up and all was well. Is also in PT. She is still very weak and VERY tired during the day. Patient says she takes 3-4 naps daily. She was supposed to take iron 3 times daily for 2 weeks post op and admits to only taking once daily due to constipation. She called over to surgeon's office and was directed by their nurse to contact PCP for possible labwork. She also wants to have annual lipis/liver done. States she usually has done in Sept. Also wants to know if she is to continue taking 81mg  ASA. I suggested an appointment and she really just wants labs. I told her I would have to send you a message for your recommendation and call her back. Please advise.

## 2019-11-09 ENCOUNTER — Ambulatory Visit (INDEPENDENT_AMBULATORY_CARE_PROVIDER_SITE_OTHER): Payer: Medicare Other | Admitting: Family Medicine

## 2019-11-09 ENCOUNTER — Encounter: Payer: Self-pay | Admitting: Family Medicine

## 2019-11-09 ENCOUNTER — Other Ambulatory Visit: Payer: Self-pay

## 2019-11-09 VITALS — BP 130/80 | HR 72 | Temp 98.0°F | Ht 59.0 in | Wt 124.4 lb

## 2019-11-09 DIAGNOSIS — R5383 Other fatigue: Secondary | ICD-10-CM

## 2019-11-09 DIAGNOSIS — K5903 Drug induced constipation: Secondary | ICD-10-CM

## 2019-11-09 DIAGNOSIS — R002 Palpitations: Secondary | ICD-10-CM

## 2019-11-09 DIAGNOSIS — Z5181 Encounter for therapeutic drug level monitoring: Secondary | ICD-10-CM | POA: Diagnosis not present

## 2019-11-09 DIAGNOSIS — E7800 Pure hypercholesterolemia, unspecified: Secondary | ICD-10-CM | POA: Diagnosis not present

## 2019-11-09 LAB — CBC WITH DIFFERENTIAL/PLATELET
Basophils Absolute: 0.1 10*3/uL (ref 0.0–0.2)
Basos: 1 %
EOS (ABSOLUTE): 0.3 10*3/uL (ref 0.0–0.4)
Eos: 4 %
Hematocrit: 43.1 % (ref 34.0–46.6)
Hemoglobin: 14.4 g/dL (ref 11.1–15.9)
Immature Grans (Abs): 0 10*3/uL (ref 0.0–0.1)
Immature Granulocytes: 0 %
Lymphocytes Absolute: 1.6 10*3/uL (ref 0.7–3.1)
Lymphs: 22 %
MCH: 30.6 pg (ref 26.6–33.0)
MCHC: 33.4 g/dL (ref 31.5–35.7)
MCV: 92 fL (ref 79–97)
Monocytes Absolute: 0.7 10*3/uL (ref 0.1–0.9)
Monocytes: 10 %
Neutrophils Absolute: 4.5 10*3/uL (ref 1.4–7.0)
Neutrophils: 63 %
Platelets: 297 10*3/uL (ref 150–450)
RBC: 4.71 x10E6/uL (ref 3.77–5.28)
RDW: 12.2 % (ref 11.7–15.4)
WBC: 7.2 10*3/uL (ref 3.4–10.8)

## 2019-11-09 LAB — COMPREHENSIVE METABOLIC PANEL
ALT: 11 IU/L (ref 0–32)
AST: 21 IU/L (ref 0–40)
Albumin/Globulin Ratio: 2 (ref 1.2–2.2)
Albumin: 4.1 g/dL (ref 3.6–4.6)
Alkaline Phosphatase: 108 IU/L (ref 48–121)
BUN/Creatinine Ratio: 16 (ref 12–28)
BUN: 13 mg/dL (ref 8–27)
Bilirubin Total: 0.9 mg/dL (ref 0.0–1.2)
CO2: 27 mmol/L (ref 20–29)
Calcium: 9.6 mg/dL (ref 8.7–10.3)
Chloride: 102 mmol/L (ref 96–106)
Creatinine, Ser: 0.8 mg/dL (ref 0.57–1.00)
GFR calc Af Amer: 78 mL/min/{1.73_m2} (ref >59)
GFR calc non Af Amer: 68 mL/min/{1.73_m2} (ref >59)
Globulin, Total: 2.1 g/dL (ref 1.5–4.5)
Glucose: 90 mg/dL (ref 65–99)
Potassium: 4.6 mmol/L (ref 3.5–5.2)
Sodium: 142 mmol/L (ref 134–144)
Total Protein: 6.2 g/dL (ref 6.0–8.5)

## 2019-11-09 LAB — LIPID PANEL
Chol/HDL Ratio: 2.9 ratio (ref 0.0–4.4)
Cholesterol, Total: 181 mg/dL (ref 100–199)
HDL: 62 mg/dL (ref >39)
LDL Chol Calc (NIH): 98 mg/dL (ref 0–99)
Triglycerides: 117 mg/dL (ref 0–149)
VLDL Cholesterol Cal: 21 mg/dL (ref 5–40)

## 2019-11-09 NOTE — Patient Instructions (Addendum)
Periodically check your blood pressure and pulse at home. Goals are 130/80 or less (occasionally higher is fine). Pulse should be 60-80's (over 100 is too high).  I recommend that you continue the metoprolol--it isn't causing side effects, and your blood pressure and pulse are normal (and would be higher without it).  I still heard a lot of extra beats, so I think it is still helping (otherwise you would be feeling palpitations).  Since you are still on pain medications, which contribute to constipation, I recommend that you take Miralax daily.  You can start with a full capful, and cut back to 1/2 capful if your bowels are too frequent or too loose.  You can further cut back (to every other day), if needed.  You may continue to use the stool softener along with the miralax.  Once you are no longer taking the pain medications, you may not need Miralax regularly.  You can stop taking the aspirin (directions were to take it twice daily for 4 weeks. You said that your doctor in [place] didn't recommend you take 81mg  of aspirin daily (I had suggested it).  You can always check and see what your cardiologist here suggests for you.

## 2019-11-10 ENCOUNTER — Encounter: Payer: Self-pay | Admitting: Family Medicine

## 2019-11-27 ENCOUNTER — Telehealth: Payer: Self-pay | Admitting: *Deleted

## 2019-11-27 NOTE — Telephone Encounter (Signed)
Patient called and states she is having some trouble sleeping. She was interested in using CBD products and wanted to know your thoughts on these.

## 2019-11-27 NOTE — Telephone Encounter (Signed)
Patient advised.

## 2019-11-27 NOTE — Telephone Encounter (Signed)
That isn't usually something that I recommend.  We usually discuss things like sleep hygiene.  If pain is a factor interfering with sleep, then making sure she takes something for pain (even just tylenol) prior to sleep.  I often suggest melatonin. If that isn't helpful, then there probably wouldn't be much harm in trying CBD, but not something I usually recommend.

## 2019-11-30 ENCOUNTER — Telehealth: Payer: Self-pay | Admitting: *Deleted

## 2019-11-30 NOTE — Telephone Encounter (Signed)
Advise pt no need to be concerned. Avoid advil and use tylenol as needed. She should use nasal saline spray. Wish her safe travels and remind to be careful on the plane, keeping her mask on the whole time.

## 2019-11-30 NOTE — Telephone Encounter (Signed)
Patient advised.

## 2019-11-30 NOTE — Telephone Encounter (Signed)
Patient called and when she woke up she had a drop of blood come out of her right nostril one time. She put Kleenex up her nose and it stopped and has been fine since. She took an advil in the middle of the night as she woke up at 3am with a HA. Should be concerned? She is flying to [place] Sunday. Please advise, thanks.

## 2019-12-18 ENCOUNTER — Encounter: Payer: Self-pay | Admitting: Family Medicine

## 2019-12-18 ENCOUNTER — Ambulatory Visit (INDEPENDENT_AMBULATORY_CARE_PROVIDER_SITE_OTHER): Payer: Medicare Other | Admitting: Family Medicine

## 2019-12-18 ENCOUNTER — Other Ambulatory Visit: Payer: Self-pay

## 2019-12-18 VITALS — BP 138/80 | HR 60 | Ht 59.0 in | Wt 125.6 lb

## 2019-12-18 DIAGNOSIS — R002 Palpitations: Secondary | ICD-10-CM | POA: Diagnosis not present

## 2019-12-18 DIAGNOSIS — R040 Epistaxis: Secondary | ICD-10-CM

## 2019-12-18 DIAGNOSIS — F411 Generalized anxiety disorder: Secondary | ICD-10-CM | POA: Diagnosis not present

## 2019-12-18 NOTE — Progress Notes (Signed)
Chief Complaint  Patient presents with  . Epistaxis    only twice and right nostril, once here and then once here in [place]. Just a couple drops. Saw Dr.Olin before her flight and instructed to wear compression hose and take asa 81mg , should she continue. (stopped a few days ago)  . Immunizations    she would like to go to pharmacy to have shingrix vaccine, does she needs a rx?  Marland Kitchen Insomnia    still having sleep issues-CBD didn't work.    She has a constant runny nose, mucus is clear.  In the past she has taken antihistamines for allergies, which helped, but recurs when she stops taking them. She is frustrated that the problem isn't "fixed" by taking the medication. She is in her garden a lot.  She had 2 bloody noses.  Once prior to trip to [place], and once while in [place].  It was in the right nostril both times, just a small amount. She had been taking aspirin per her ortho's recommendation, for her travel, and is asking if she needs to continue this.  Hypertension:  She reports that she is seeing some higher blood pressures at home, in the 140's.  She brought in monitor, which was verified as accurate.   She hasn't been sleeping as well. She was going through some old boxes, came across an old diary, where every other entry mentions her boyfriend at the time, Thayer Jew, who didn't return from [place].  She can't stop thinking about him, which is interfering with her sleep. She doesn't necessarily find it upsetting, but can't let it go.  She took a whole bottle of CBD gummies, but it didn't help with her sleep.   PMH, PSH, SH reviewed  Outpatient Encounter Medications as of 12/18/2019  Medication Sig Note  . atorvastatin (LIPITOR) 40 MG tablet Take 1 tablet (40 mg total) by mouth at bedtime.   . Biotin 10000 MCG TABS Take 10,000 mcg by mouth daily.   . Calcium Carbonate-Vitamin D (CALTRATE 600+D) 600-400 MG-UNIT per tablet Take 1 tablet by mouth daily at 3 pm.    . metoprolol tartrate (LOPRESSOR) 25 MG  tablet Take 1 tablet (25 mg total) by mouth 2 (two) times daily.   . Multiple Vitamin (MULTIVITAMIN WITH MINERALS) TABS tablet Take 1 tablet by mouth daily. CENTRUM MULTIVITAMIN   . acetaminophen (TYLENOL) 500 MG tablet Take 2 tablets (1,000 mg total) by mouth every 8 (eight) hours. (Patient not taking: Reported on 11/09/2019)   . amoxicillin (AMOXIL) 500 MG capsule Take 2,000 mg by mouth once. (Patient not taking: Reported on 11/09/2019) 10/04/2019: Take one hour prior to dental work  . docusate sodium (COLACE) 100 MG capsule Take 1 capsule (100 mg total) by mouth 2 (two) times daily. (Patient not taking: Reported on 12/18/2019)   . oxyCODONE (OXY IR/ROXICODONE) 5 MG immediate release tablet Take 1-2 tablets (5-10 mg total) by mouth every 6 (six) hours as needed for moderate pain or severe pain. (Patient not taking: Reported on 12/18/2019) 11/09/2019: Taking once a night, occasionally once during the day if needed (not daily)  . polyethylene glycol (MIRALAX / GLYCOLAX) 17 g packet Take 17 g by mouth 2 (two) times daily. (Patient not taking: Reported on 12/18/2019)   . [DISCONTINUED] ferrous sulfate (FERROUSUL) 325 (65 FE) MG tablet Take 1 tablet (325 mg total) by mouth 3 (three) times daily with meals for 14 days. (Patient not taking: Reported on 11/09/2019)   . [DISCONTINUED] HYDROmorphone (DILAUDID) 2 MG  tablet Take 2 mg by mouth every 4 (four) hours as needed for severe pain. 11/09/2019: Had leftover from knee surgery-takes sometimes when she doesn't take the oxycodone   No facility-administered encounter medications on file as of 12/18/2019.   No Known Allergies  ROS: no fever, chills, URI symptoms.  Runny nose/allergies per HPI.  No cough, shortness of breath, chest pain. No leg swelling.  No GI complaints.   +anxiety, difficulty shutting mind down, contributing to insomnia. Nosebleeds x 2, short-lived and easily controlled, per HPI NO other bruising or bleeding   PHYSICAL EXAM:  BP 138/80   Pulse  60   Ht 4\' 11"  (1.499 m)   Wt 125 lb 9.6 oz (57 kg)   BMI 25.37 kg/m   Pleasant, elderly female in no distress, talkative Some throat-clearing noted during visit. No hoarseness (scratchiness improved with clearing of throat) HEENT: conjunctiva and sclera are clear, EOMI. Nasal mucosa is moderately edematous bilaterally. No significant clot noted on right. No purulence, some clear drainage.  Sinuses nontender Neck: no lymphadenopathy or mass Heart: regular rate and rhythm Lungs: clear bilaterally Extremities: no edema Skin: no significant bruising, no rash Psych: anxious, talkative. Normal eye contact, speech, hygiene and grooming.   ASSESSMENT/PLAN:  Epistaxis - only 2 episodes, short-lived. No further eval needed.  No need for ongoing aspirin. Allergies/inflammation can contribute. Saline spray and antihistamine  Generalized anxiety disorder - previously took lexapro. Discussed insomnia as sx of anxiety. Encouraged counseling; consider restarting lexapro  Palpitations - this is why pt takes beta blocker; no h/o HTN. Discussed how anxiety and poor sleep can contribute to higher BP. Monitor/record and bring to cardiologist   Sees cardiologist in December.  To bring list of BP's to visit, monitor is accurate. She also will see her PCP in [place] in 04/2020.   Keep a record of your blood pressure, recording it on the sheet provided, along with comments as we discussed (reasons for why it may be high or low). Continue to try and follow a low sodium diet and get daily exercise.   I recommend taking Zyrtec (cetirizine) nightly at bedtime.  This is an allergy medication that should help with your runny nose, and possibly also help with your sleep.  If you start having frequent nose bleeds, then be sure to use nasal saline spray regularly throughout the day.  If they become harder to stop (prolonged nose bleeds), then cutting back on aspirin will help. Your two bleeds sound minor, so no  changes are being recommended today. Okay to remain OFF of aspirin.  Trouble sleeping can sometimes be a symptom of anxiety, when you can't shut down your mind. I recommend doing some relaxation techniques (meditation, deep breathing). You can also try melatonin at bedtime. If this isn't helping, perhaps we should consider restarting the lexapro.  I highly encourage you to look into counseling to help you, as we discussed.

## 2019-12-18 NOTE — Patient Instructions (Addendum)
Keep a record of your blood pressure, recording it on the sheet provided, along with comments as we discussed (reasons for why it may be high or low). Continue to try and follow a low sodium diet and get daily exercise. Bring this list to you when you see the cardiologist, and your PCP in [place].  I recommend taking Zyrtec (cetirizine) nightly at bedtime.  This is an allergy medication that should help with your runny nose, and possibly also help with your sleep.  Consider wearing a mask while gardening, if that is a trigger for your allergies.  Trouble sleeping can sometimes be a symptom of anxiety, when you can't shut down your mind. I recommend doing some relaxation techniques (meditation, deep breathing). You can also try melatonin at bedtime. If this isn't helping, perhaps we should consider restarting the lexapro.  I highly encourage you to look into counseling to help you, as we discussed.

## 2020-01-15 ENCOUNTER — Telehealth: Payer: Self-pay | Admitting: *Deleted

## 2020-01-15 NOTE — Telephone Encounter (Signed)
Patient advised.

## 2020-01-15 NOTE — Telephone Encounter (Signed)
Ensure that patient isn't using stool softeners or miralax regularly, if having ongoing issues with loose stools.  If she is, she should cut back or temporarily hold these. She should avoid dairy products in her diet for the next week or two, and try taking probiotics daily for a few weeks. If her symptoms persist, she should schedule a visit.  It does NOT sound like Norovirus (which would be more like an acute illness, not lasting 3 weeks)

## 2020-01-15 NOTE — Telephone Encounter (Signed)
Patient called and has been having very loose stools for 3 weeks, esp after she eats. Has taken imodium that expired in 2017 that hasn't really helped. She has not had any dietary changes and said she is drinking plenty of fluids. No fever or any other symptoms. Is worries because a friend of hers had norovirus one time and she wants to make sure she doesn't have that. Can you recommend something?

## 2020-01-24 ENCOUNTER — Other Ambulatory Visit: Payer: Self-pay

## 2020-01-24 ENCOUNTER — Other Ambulatory Visit: Payer: Medicare Other

## 2020-01-24 ENCOUNTER — Encounter: Payer: Self-pay | Admitting: Family Medicine

## 2020-01-24 ENCOUNTER — Ambulatory Visit (INDEPENDENT_AMBULATORY_CARE_PROVIDER_SITE_OTHER): Payer: Medicare Other | Admitting: Family Medicine

## 2020-01-24 VITALS — BP 120/70 | HR 80 | Temp 98.4°F | Ht 59.0 in | Wt 126.8 lb

## 2020-01-24 DIAGNOSIS — Z23 Encounter for immunization: Secondary | ICD-10-CM

## 2020-01-24 DIAGNOSIS — R197 Diarrhea, unspecified: Secondary | ICD-10-CM

## 2020-01-24 NOTE — Progress Notes (Signed)
Chief Complaint  Patient presents with  . Diarrhea    x 1 month. Has cut out dairy products, except for some spaghetti sauce-which may have some butter in it. She did have eggs this morning, No ice cream. Taking pro-biotic gummies. Leaving for [facility] Sunday for 2 weeks. (flying)   Patient presents with complaint of diarrhea x 1 month. Stools are very loose, light brown.  Never sees blood. Sometimes might see some mucus. She stopped stool softeners about 2 months ago. Previously had issues with constipation. She also stopped eating prunes, and her fiber cereal that had dates and fig. No other changes in her diet.  Has been avoiding dairy.  Often happens after breakfast in the morning, sometimes even before.  Usually just once a day, sometimes twice, rarely a third. Not always related to eating. Lately they have been more loose, not many more formed bowel movements.  While exercising this morning ,had to run to the bathroom; has had some accidents related to fecal urgency and loose stools.  Denies significant abdominal pain, fever, nausea or vomiting.  She is anxious as she will be flying to [place] soon. Asking about taking Imodium.  She denies travel (other than to [place]), camping, undercooked/raw meats/fish, sick contacts or recent antibiotics.  PMH, PSH, SH reviewed  Outpatient Encounter Medications as of 01/24/2020  Medication Sig Note  . atorvastatin (LIPITOR) 40 MG tablet Take 1 tablet (40 mg total) by mouth at bedtime.   . Biotin 10000 MCG TABS Take 10,000 mcg by mouth daily.   . Calcium Carbonate-Vitamin D (CALTRATE 600+D) 600-400 MG-UNIT per tablet Take 1 tablet by mouth daily at 3 pm.    . metoprolol tartrate (LOPRESSOR) 25 MG tablet Take 1 tablet (25 mg total) by mouth 2 (two) times daily.   . Multiple Vitamin (MULTIVITAMIN WITH MINERALS) TABS tablet Take 1 tablet by mouth daily. CENTRUM MULTIVITAMIN   . Probiotic Product (PROBIOTIC DAILY PO) Take 2 each by mouth daily.  01/24/2020: Gummy  . acetaminophen (TYLENOL) 500 MG tablet Take 2 tablets (1,000 mg total) by mouth every 8 (eight) hours. (Patient not taking: Reported on 11/09/2019)   . amoxicillin (AMOXIL) 500 MG capsule Take 2,000 mg by mouth once. (Patient not taking: Reported on 11/09/2019) 10/04/2019: Take one hour prior to dental work  . docusate sodium (COLACE) 100 MG capsule Take 1 capsule (100 mg total) by mouth 2 (two) times daily. (Patient not taking: Reported on 12/18/2019)   . oxyCODONE (OXY IR/ROXICODONE) 5 MG immediate release tablet Take 1-2 tablets (5-10 mg total) by mouth every 6 (six) hours as needed for moderate pain or severe pain. (Patient not taking: Reported on 12/18/2019) 11/09/2019: Taking once a night, occasionally once during the day if needed (not daily)  . polyethylene glycol (MIRALAX / GLYCOLAX) 17 g packet Take 17 g by mouth 2 (two) times daily. (Patient not taking: Reported on 12/18/2019)    No facility-administered encounter medications on file as of 01/24/2020.   No Known Allergies  ROS: no fever, chills, weight loss, anorexia.  Diarrhea per HPI. No urinary complaints, bleeding, bruising, rash. Moods good. See HPI   PHYSICAL EXAM:  BP 120/70   Pulse 80   Temp 98.4 F (36.9 C) (Tympanic)   Ht 4\' 11"  (1.499 m)   Wt 126 lb 12.8 oz (57.5 kg)   BMI 25.61 kg/m   Wt Readings from Last 3 Encounters:  01/24/20 126 lb 12.8 oz (57.5 kg)  12/18/19 125 lb 9.6 oz (57 kg)  11/09/19 124 lb 6.4 oz (56.4 kg)   Pleasant, well-appearing female, slightly anxious, no distress. HEENT: conjunctiva and sclera are clear, EOMI. Wearing mask Neck: no lymphadenopathy, thyromegaly or mass Heart: regular rate and rhythm Lungs: clear bilaterally Abdomen: soft, normal bowel sounds, nontender, no organomegaly or mass Back: no spinal or CVA tenderness Extremities: no edema Neuro: alert and oriented, normal gait strength Psych: somewhat anxious. Full range of affect, normal hygiene and  grooming  ASSESSMENT/PLAN:   Diarrhea, unspecified type - Ddx reviewed. Stools loose, not frequent. Encouraged increased fiber intake. imodium only prior to travel, not regularly. Diet reviewed  Need for influenza vaccination - Plan: Flu Vaccine QUAD High Dose(Fluad)   Drink plenty of water. I'd like for you to get more fiber in the diet or a fiber supplement such as Metamucil, as this can help bulk up the stool. Avoid imodium, since your frequency is only 2x/day, and it might cause more constipation.  You may use it prior to your flight to [place] to prevent an accident.  Continue to avoid dairy (you can use non-dairy milks such as soy milk or almond milk). Limit spicy/greasy/fatty/fried foods.  Bananas, rice applesauce, toast "BRAT" diet can be helpful.  If you develop fever, blood in your stool, abdominal pain, nausea, vomiting, weight loss--these are symptoms that are concerning for underlying problems.  FTF 25 minutes, additional 5 minutes spent in chart review, documentation.

## 2020-01-24 NOTE — Patient Instructions (Addendum)
Drink plenty of water. I'd like for you to get more fiber in the diet or a fiber supplement such as Metamucil, as this can help bulk up the stool. Avoid imodium, since your frequency is only 2x/day, and it might cause more constipation.  You may use it prior to your flight to [place] to prevent an accident.  Continue to avoid dairy (you can use non-dairy milks such as soy milk or almond milk). Limit spicy/greasy/fatty/fried foods.  Bananas, rice applesauce, toast "BRAT" diet can be helpful.  If you develop fever, blood in your stool, abdominal pain, nausea, vomiting, weight loss--these are symptoms that are concerning for underlying problems.    Diarrhea, Adult Diarrhea is frequent loose and watery bowel movements. Diarrhea can make you feel weak and cause you to become dehydrated. Dehydration can make you tired and thirsty, cause you to have a dry mouth, and decrease how often you urinate. Diarrhea typically lasts 2-3 days. However, it can last longer if it is a sign of something more serious. It is important to treat your diarrhea as told by your health care provider. Follow these instructions at home: Eating and drinking     Follow these recommendations as told by your health care provider:  Take an oral rehydration solution (ORS). This is an over-the-counter medicine that helps return your body to its normal balance of nutrients and water. It is found at pharmacies and retail stores.  Drink plenty of fluids, such as water, ice chips, diluted fruit juice, and low-calorie sports drinks. You can drink milk also, if desired.  Avoid drinking fluids that contain a lot of sugar or caffeine, such as energy drinks, sports drinks, and soda.  Eat bland, easy-to-digest foods in small amounts as you are able. These foods include bananas, applesauce, rice, lean meats, toast, and crackers.  Avoid alcohol.  Avoid spicy or fatty foods.  Medicines  Take over-the-counter and prescription  medicines only as told by your health care provider.  If you were prescribed an antibiotic medicine, take it as told by your health care provider. Do not stop using the antibiotic even if you start to feel better. General instructions   Wash your hands often using soap and water. If soap and water are not available, use a hand sanitizer. Others in the household should wash their hands as well. Hands should be washed: ? After using the toilet or changing a diaper. ? Before preparing, cooking, or serving food. ? While caring for a sick person or while visiting someone in a hospital.  Drink enough fluid to keep your urine pale yellow.  Rest at home while you recover.  Watch your condition for any changes.  Take a warm bath to relieve any burning or pain from frequent diarrhea episodes.  Keep all follow-up visits as told by your health care provider. This is important. Contact a health care provider if:  You have a fever.  Your diarrhea gets worse.  You have new symptoms.  You cannot keep fluids down.  You feel light-headed or dizzy.  You have a headache.  You have muscle cramps. Get help right away if:  You have chest pain.  You feel extremely weak or you faint.  You have bloody or black stools or stools that look like tar.  You have severe pain, cramping, or bloating in your abdomen.  You have trouble breathing or you are breathing very quickly.  Your heart is beating very quickly.  Your skin feels cold and clammy.  You feel confused.  You have signs of dehydration, such as: ? Dark urine, very little urine, or no urine. ? Cracked lips. ? Dry mouth. ? Sunken eyes. ? Sleepiness. ? Weakness. Summary  Diarrhea is frequent loose and watery bowel movements. Diarrhea can make you feel weak and cause you to become dehydrated.  Drink enough fluids to keep your urine pale yellow.  Make sure that you wash your hands after using the toilet. If soap and water are  not available, use hand sanitizer.  Contact a health care provider if your diarrhea gets worse or you have new symptoms.  Get help right away if you have signs of dehydration. This information is not intended to replace advice given to you by your health care provider. Make sure you discuss any questions you have with your health care provider. Document Revised: 08/02/2018 Document Reviewed: 08/20/2017 Elsevier Patient Education  [employer].

## 2020-02-12 ENCOUNTER — Ambulatory Visit
Admission: RE | Admit: 2020-02-12 | Discharge: 2020-02-12 | Disposition: A | Discharge status: Home / Self Care | Payer: Medicare Other | Source: Ambulatory Visit | Attending: Physician Assistant | Admitting: Physician Assistant

## 2020-02-12 ENCOUNTER — Other Ambulatory Visit: Payer: Self-pay | Admitting: Physician Assistant

## 2020-02-12 DIAGNOSIS — R197 Diarrhea, unspecified: Secondary | ICD-10-CM

## 2020-02-14 ENCOUNTER — Telehealth: Payer: Self-pay | Admitting: *Deleted

## 2020-02-14 NOTE — Telephone Encounter (Signed)
noted 

## 2020-02-14 NOTE — Telephone Encounter (Signed)
Patient called and just wanted to let you know that she was in [facility] for two weeks and continued to have diarrhea. She was seen by a PA at [facility] Monday and they did stool tests, xray of stomach, and labs. They think it is related to abx reaction post hip surgery. Just wanted it noted.

## 2020-02-15 ENCOUNTER — Telehealth: Payer: Self-pay | Admitting: *Deleted

## 2020-02-15 NOTE — Telephone Encounter (Signed)
Patient advised.

## 2020-02-15 NOTE — Telephone Encounter (Signed)
That's really a question she should have asked the GI folks who are addressing and treating her diarrhea. It is probably fine, but stop if she develops worsening bloating/gas/diarrhea.

## 2020-02-15 NOTE — Telephone Encounter (Signed)
Patient called to update you again. All labs were normal. Stool sample negative. Xray showed stool burden, constipation overflow diarrhea from buildup of stool. Told to stop metamucil and start miralax and senna nightly. She wants to know if you want her to continue to not eat dairy?

## 2020-02-25 NOTE — Progress Notes (Signed)
Cardiology Office Note:    Date:  02/29/2020   ID:  Alexis Hines, DOB 30-Aug-1933, MRN [id number]  PCP:  Rita Ohara, MD  Cardiologist:  Donato Heinz, MD  Electrophysiologist:  None   Referring MD: Rita Ohara, MD   Chief Complaint  Patient presents with  . Palpitations    History of Present Illness:    Alexis Hines is a 86 y.o. female with a hx of basal cell cancer, squamous cell carcinoma of skin, hyperlipidemia who presents for follow-up appointment. She was referred by Dr. Tomi Bamberger for evaluation of EKG abnormalities and preop evaluation, initially seen on 08/24/2019.  Plan for hip surgery on 08/29/2019.  Preoperative EKG done at Dr. Johnsie Kindred office 5/24 was concerning for AV dissociation and accelerated junctional rhythm.  On my review, appears to be sinus rhythm with frequent PACs.  Underwent hip surgery 4/33/29, no complications.  At initial clinic visit, she reported having palpitations and episodes of lightheadedness that lasted less than 1 minute.  She underwent a nuclear stress test in [place] on 07/05/2017, which showed normal EF, no perfusion defects.  Echocardiogram on 08/31/2019 showed LVEF 51-88%, grade 1 diastolic function, normal RV function, mild RV enlargement, degenerative mitral valve with mild MR. Zio patch x3 days on 09/14/2019 showed frequent PACs (11% of beats), with frequent episodes of SVT lasting up to 20 seconds.  Started on metoprolol.  Since last clinic visit, she reports that she has been doing well.  She denies any palpitations.  Denies any lightheadedness or syncope.  She has been exercising 3 days/week, does aerobics with weights and stretches.  She denies any exertional symptoms.  She cut back her coffee intake to 1 cup/day.   Past Medical History:  Diagnosis Date  . Abnormal TSH 2004   normal since  . Arthritis   . Basal cell cancer    nose; Dr. Allyson Sabal   . Diverticulosis 8/07  . Dizziness   . Hypertension   . Migraine childhood   resolved    . PAC (premature atrial contraction)    frequent on cardiac monitor 09/14/19  . Pneumonia   . Pure hypercholesterolemia   . Radial styloid tenosynovitis (de quervain) 11/2018   injected by Dr. Fredna Dow 11/2018, 02/2019  . SCCA (squamous cell carcinoma) of skin 12/2015   right temple  . Squamous cell carcinoma of skin 02/15/2017   in situ-left jaw (CX35FU)    Past Surgical History:  Procedure Laterality Date  . BREAST BIOPSY Left 1984   benign  . EYE SURGERY     Bil and eyelids lifted  . left hip intra-articular steriod injection  03/13/2014  . Mohs procedure    . TONSILLECTOMY  age 75  . TOTAL HIP ARTHROPLASTY Right 10/10/2019   Procedure: TOTAL HIP ARTHROPLASTY ANTERIOR APPROACH;  Surgeon: Paralee Cancel, MD;  Location: [facility];  Service: Orthopedics;  Laterality: Right;  70 mins  . TOTAL KNEE ARTHROPLASTY Left 07/2003   Dr. Theda Sers  . TOTAL KNEE ARTHROPLASTY Right 09/03/2017   Procedure: RIGHT TOTAL KNEE ARTHROPLASTY;  Surgeon: Sydnee Cabal, MD;  Location: [facility];  Service: Orthopedics;  Laterality: Right;    Current Medications: Current Meds  Medication Sig  . acetaminophen (TYLENOL) 500 MG tablet Take 2 tablets (1,000 mg total) by mouth every 8 (eight) hours.  Marland Kitchen atorvastatin (LIPITOR) 40 MG tablet Take 1 tablet (40 mg total) by mouth at bedtime.  . Biotin 10000 MCG TABS Take 10,000 mcg by mouth daily.  Marland Kitchen docusate sodium (COLACE)  100 MG capsule Take 1 capsule (100 mg total) by mouth 2 (two) times daily.  Marland Kitchen linaCLOtide (LINZESS PO) Take by mouth.  . Multiple Vitamin (MULTIVITAMIN WITH MINERALS) TABS tablet Take 1 tablet by mouth daily. CENTRUM MULTIVITAMIN  . oxyCODONE (OXY IR/ROXICODONE) 5 MG immediate release tablet Take 1-2 tablets (5-10 mg total) by mouth every 6 (six) hours as needed for moderate pain or severe pain.  . Probiotic Product (PROBIOTIC DAILY PO) Take 2 each by mouth daily.  . [DISCONTINUED] amoxicillin (AMOXIL) 500 MG capsule Take 2,000 mg by mouth once.   .  [DISCONTINUED] Calcium Carbonate-Vitamin D (CALTRATE 600+D) 600-400 MG-UNIT per tablet Take 1 tablet by mouth daily at 3 pm.   . [DISCONTINUED] metoprolol tartrate (LOPRESSOR) 25 MG tablet Take 1 tablet (25 mg total) by mouth 2 (two) times daily.  . [DISCONTINUED] polyethylene glycol (MIRALAX / GLYCOLAX) 17 g packet Take 17 g by mouth 2 (two) times daily.     Allergies:   Patient has no known allergies.   Social History   Socioeconomic History  . Marital status: Widowed    Spouse name: Not on file  . Number of children: 2  . Years of education: Not on file  . Highest education level: Not on file  Occupational History  . Occupation: Retired Psychologist, prison and probation services  Tobacco Use  . Smoking status: Never Smoker  . Smokeless tobacco: Never Used  Vaping Use  . Vaping Use: Never used  Substance and Sexual Activity  . Alcohol use: Yes    Comment: occasional glass of wine.  . Drug use: No  . Sexual activity: Not Currently  Other Topics Concern  . Not on file  Social History Narrative   Widowed. Lives 1/2 the year in [place]. Her husband (retired Tax adviser) died from liver cancer in 04/26/11.  Daughter lives in [place]; 2 grandchildren here, and 2 in [place] Determinants of Health   Financial Resource Strain:   . Difficulty of Paying Living Expenses: Not on file  Food Insecurity:   . Worried About Charity fundraiser in the Last Year: Not on file  . Ran Out of Food in the Last Year: Not on file  Transportation Needs:   . Lack of Transportation (Medical): Not on file  . Lack of Transportation (Non-Medical): Not on file  Physical Activity:   . Days of Exercise per Week: Not on file  . Minutes of Exercise per Session: Not on file  Stress:   . Feeling of Stress : Not on file  Social Connections:   . Frequency of Communication with Friends and Family: Not on file  . Frequency of Social Gatherings with Friends and Family: Not on file  . Attends Religious Services: Not on file  .  Active Member of Clubs or Organizations: Not on file  . Attends Archivist Meetings: Not on file  . Marital Status: Not on file     Family History: The patient's family history includes Cancer in her sister; Cancer (age of onset: 32) in her sister; Gout in her son; Heart disease (age of onset: 57) in her brother; Hyperlipidemia in her brother; Rheum arthritis in her daughter.  ROS:   Please see the history of present illness.     All other systems reviewed and are negative.  EKGs/Labs/Other Studies Reviewed:    The following studies were reviewed today:   EKG:  EKG is ordered today.  The ekg ordered today demonstrates sinus rhythm, rate 62, first-degree  AV block, PACs  Recent Labs: 08/03/2019: TSH 0.821 08/21/2019: Magnesium 2.1 11/09/2019: ALT 11; BUN 13; Creatinine, Ser 0.80; Hemoglobin 14.4; Platelets 297; Potassium 4.6; Sodium 142  Recent Lipid Panel    Component Value Date/Time   CHOL 181 11/09/2019 0947   TRIG 117 11/09/2019 0947   HDL 62 11/09/2019 0947   CHOLHDL 2.9 11/09/2019 0947   CHOLHDL 1.9 12/23/2016 0816   VLDL 26 12/26/2015 0706   LDLCALC 98 11/09/2019 0947   LDLCALC 65 12/23/2016 0816    Physical Exam:    VS:  BP (!) 142/85   Pulse 62   Ht 4\' 11"  (1.499 m)   Wt 122 lb 12.8 oz (55.7 kg)   SpO2 94%   BMI 24.80 kg/m     Wt Readings from Last 3 Encounters:  02/29/20 122 lb 12.8 oz (55.7 kg)  01/24/20 126 lb 12.8 oz (57.5 kg)  12/18/19 125 lb 9.6 oz (57 kg)     GEN:  in no acute distress HEENT: Normal NECK: No JVD; No carotid bruits LYMPHATICS: No lymphadenopathy CARDIAC: irregular, tachycardic, no murmurs RESPIRATORY:  Clear to auscultation without rales, wheezing or rhonchi  ABDOMEN: Soft, non-tender, non-distended MUSCULOSKELETAL:  No edema; No deformity  SKIN: Warm and dry NEUROLOGIC:  Alert and oriented x 3 PSYCHIATRIC:  Normal affect   ASSESSMENT:    1. SVT (supraventricular tachycardia) (HCC)   2. Palpitations   3.  Lightheadedness    PLAN:     Lightheadedness/near syncope/palpitations: Frequent PACs/runs of SVT on EKG. Zio patch x3 days on 09/14/2019 showed frequent PACs (11% of beats), with frequent episodes of SVT lasting up to 20 seconds.  Started on metoprolol 25 mg twice daily. -Reports palpitations have resolved and no further lightheadedness.  Continue metoprolol, will consolidate to Toprol-XL 50 mg daily  RTC in 1 year   Medication Adjustments/Labs and Tests Ordered: Current medicines are reviewed at length with the patient today.  Concerns regarding medicines are outlined above.  Orders Placed This Encounter  Procedures  . EKG 12-Lead   Meds ordered this encounter  Medications  . metoprolol succinate (TOPROL-XL) 50 MG 24 hr tablet    Sig: Take 1 tablet (50 mg total) by mouth daily. Take with or immediately following a meal.    Dispense:  90 tablet    Refill:  3    Patient Instructions  Medication Instructions:  STOP METOPROLOL TARTRATE START: METOPROLOL SUCCINATE (TOPROL XL) 50mg  ONCE DAILY  *If you need a refill on your cardiac medications before your next appointment, please call your pharmacy*  Follow-Up: At [facility], you and your health needs are our priority.  As part of our continuing mission to provide you with exceptional heart care, we have created designated Provider Care Teams.  These Care Teams include your primary Cardiologist (physician) and Advanced Practice Providers (APPs -  Physician Assistants and Nurse Practitioners) who all work together to provide you with the care you need, when you need it.  We recommend signing up for the patient portal called "MyChart".  Sign up information is provided on this After Visit Summary.  MyChart is used to connect with patients for Virtual Visits (Telemedicine).  Patients are able to view lab/test results, encounter notes, upcoming appointments, etc.  Non-urgent messages can be sent to your provider as well.   To learn more  about what you can do with MyChart, go to [email or web address].    Your next appointment:   1 year(s)  The format for your next appointment:  In Person  Provider:   Oswaldo Milian, MD    Signed, Donato Heinz, MD  02/29/2020 10:32 AM    [place]

## 2020-02-29 ENCOUNTER — Other Ambulatory Visit: Payer: Self-pay

## 2020-02-29 ENCOUNTER — Encounter: Payer: Self-pay | Admitting: Cardiology

## 2020-02-29 ENCOUNTER — Ambulatory Visit: Payer: Medicare Other | Admitting: Cardiology

## 2020-02-29 VITALS — BP 142/85 | HR 62 | Ht 59.0 in | Wt 122.8 lb

## 2020-02-29 DIAGNOSIS — R42 Dizziness and giddiness: Secondary | ICD-10-CM

## 2020-02-29 DIAGNOSIS — R002 Palpitations: Secondary | ICD-10-CM | POA: Diagnosis not present

## 2020-02-29 DIAGNOSIS — I471 Supraventricular tachycardia, unspecified: Secondary | ICD-10-CM

## 2020-02-29 MED ORDER — METOPROLOL SUCCINATE ER 50 MG PO TB24: 50.0000 mg | ORAL_TABLET | Freq: Every day | ORAL | 3 refills | Status: DC

## 2020-02-29 NOTE — Patient Instructions (Signed)
Medication Instructions:  STOP METOPROLOL TARTRATE START: METOPROLOL SUCCINATE (TOPROL XL) 50mg  ONCE DAILY  *If you need a refill on your cardiac medications before your next appointment, please call your pharmacy*  Follow-Up: At [facility], you and your health needs are our priority.  As part of our continuing mission to provide you with exceptional heart care, we have created designated Provider Care Teams.  These Care Teams include your primary Cardiologist (physician) and Advanced Practice Providers (APPs -  Physician Assistants and Nurse Practitioners) who all work together to provide you with the care you need, when you need it.  We recommend signing up for the patient portal called "MyChart".  Sign up information is provided on this After Visit Summary.  MyChart is used to connect with patients for Virtual Visits (Telemedicine).  Patients are able to view lab/test results, encounter notes, upcoming appointments, etc.  Non-urgent messages can be sent to your provider as well.   To learn more about what you can do with MyChart, go to [email or web address].    Your next appointment:   1 year(s)  The format for your next appointment:   In Person  Provider:   Oswaldo Milian, MD

## 2020-08-05 ENCOUNTER — Telehealth: Payer: Self-pay | Admitting: *Deleted

## 2020-08-05 NOTE — Telephone Encounter (Signed)
Patient is back from [place] and wanted to update you. Her right leg started hurting her while she was in [place], she saw ortho and had MRI- told she has spinal stenosis. Given a shot, her right knee still hurts from time to time. She has a runny nose continuously despite trying zyrtec, allegra, claritin, afrin and flonase. She has been told to see an allergist and is asking for a recommendation. I told her she might need an appointment for a referral, please advise or recommend. Thanks.

## 2020-08-05 NOTE — Telephone Encounter (Signed)
OV is recommended for eval and referral

## 2020-08-05 NOTE — Telephone Encounter (Signed)
Spoke with patient and she scheduled appt for Wed 08/07/20.

## 2020-08-06 NOTE — Progress Notes (Signed)
Chief Complaint  Patient presents with  . Nasal Congestion    Complains that her nose continuously run no matter allergy meds she takes. Is thinking she should see an allergist. Brought in recent labs, mammogram, MRI and injection note. There is a refill request that came in today for her atorvastatin-was waiting for you to review her labs.    "My nose keeps running". "I've tried zyrtec, allegra, claritin, afrin, flonase and nothing helps." She would try one for a month or so, and then switch. This started in November, prior to leaving for [place], bothered her the whole time in [place]. She hasn't taken anything for the last 2 months.  Denies itchy or watery eyes, no PND, no sinus pain, no cough  Chart reviewed--seein in 11/2019, also with complaint of constant runny nose, and reported that antihistamines helped, but runny nose recurred when she stops using them (and was frustrated that problem wasn't "fixed" by the medication).  She is back from [facility] and brings records updating me on her care and problems.  She states that her right leg started hurting her while she was in [place], she saw ortho and had MRI- told she has spinal stenosis. She got R L3-4 and L4-5 transforaminal epidural steroid injection on 07/18/20.  She reports walking better, less limping, still has some pain in the RLE.  She plans to see Dr. Alvan Dame for her yearly f/u on her hip in July (and will ask him about whom to see for her back/radiculopathy if not improved).  Labs from 05/2020--normal urine, CBC and CK; A1c 5.3. normal thyroid tests. Lipids: TC 186, TG 99, HDL 76, LDL 90,  c-met normal, fasting glu 89 Also brought in 2021 labs--D was normal at 44.9, TSH low at 0.305, other labs also normal.   PMH, PSH, SH reviewed  Outpatient Encounter Medications as of 08/07/2020  Medication Sig Note  . atorvastatin (LIPITOR) 40 MG tablet Take 1 tablet (40 mg total) by mouth at bedtime.   . Biotin 10000 MCG TABS Take 10,000 mcg by mouth  daily.   . Calcium Carbonate-Vitamin D (CALTRATE 600+D PO) Take 1 tablet by mouth daily.   . metoprolol succinate (TOPROL-XL) 50 MG 24 hr tablet Take 1 tablet (50 mg total) by mouth daily. Take with or immediately following a meal.   . Multiple Vitamin (MULTIVITAMIN WITH MINERALS) TABS tablet Take 1 tablet by mouth daily. CENTRUM MULTIVITAMIN   . Probiotic Product (PROBIOTIC DAILY PO) Take 2 each by mouth daily. 01/24/2020: Gummy  . docusate sodium (COLACE) 100 MG capsule Take 1 capsule (100 mg total) by mouth 2 (two) times daily. (Patient not taking: Reported on 08/07/2020)   . [DISCONTINUED] acetaminophen (TYLENOL) 500 MG tablet Take 2 tablets (1,000 mg total) by mouth every 8 (eight) hours. (Patient not taking: Reported on 08/07/2020)   . [DISCONTINUED] linaCLOtide (LINZESS PO) Take by mouth.   . [DISCONTINUED] oxyCODONE (OXY IR/ROXICODONE) 5 MG immediate release tablet Take 1-2 tablets (5-10 mg total) by mouth every 6 (six) hours as needed for moderate pain or severe pain. 11/09/2019: Taking once a night, occasionally once during the day if needed (not daily)   No facility-administered encounter medications on file as of 08/07/2020.   No Known Allergies  ROS: no fever, chills, headaches, dizziness, chest pain, palpitations.  No GI complaints. Bowels are normal on regimen of benefiber twice daily and prunes regularly. No urinary complaints. No edema.  +runny nose and RLE pain per HPI. Moods are good.  PHYSICAL EXAM:  BP 116/70   Pulse 80   Temp 98.3 F (36.8 C) (Tympanic)   Ht '4\' 11"'  (1.499 m)   Wt 122 lb 6.4 oz (55.5 kg)   BMI 24.72 kg/m   Pleasant female, in good spirits, in no distress. No sniffling or runny nose noted during visit. HEENT: conjunctiva and sclera are clear, EOMI.  Nasal mucosa is mildly edematous, no drainage noted, some deviated septum. Sinuses nontender. OP is clear. TM's and EAC's are normal bilaterally. Neck: no lymphadenopathy, thyromegaly or carotid bruit Heart:  regular rate and rhythm, no murmur Lungs: clear bilaterally Abdomen: soft, nontender, no mass Extremities: no edema, 2+ pulses Back: Spine nontender, no CVA tenderness Neuro: alert and oriented. Normal gait. DTR's symmetric Psych: normal mood, affect, hygiene and grooming   ASSESSMENT/PLAN:  Allergic rhinitis, unspecified seasonality, unspecified trigger - Start flonase and zyrtec.  Proper use reviewed in detail.  Contact us if sx not improving after 1-2 weeks.  Pure hypercholesterolemia - Lipids at goal, refilled atorvastatin for the year. - Plan: atorvastatin (LIPITOR) 40 MG tablet  Lumbar radiculopathy - some benefit from epidural injection. May need f/u here with ortho-spine or neurosurg (pain med provider at their office) for further injections    I spent 36 minutes dedicated to the care of this patient, including pre-visit review of records, face to face time, post-visit ordering of testing and documentation.

## 2020-08-07 ENCOUNTER — Other Ambulatory Visit: Payer: Self-pay | Admitting: Family Medicine

## 2020-08-07 ENCOUNTER — Ambulatory Visit (INDEPENDENT_AMBULATORY_CARE_PROVIDER_SITE_OTHER): Payer: Medicare Other | Admitting: Family Medicine

## 2020-08-07 ENCOUNTER — Encounter: Payer: Self-pay | Admitting: Family Medicine

## 2020-08-07 VITALS — BP 116/70 | HR 80 | Temp 98.3°F | Ht 59.0 in | Wt 122.4 lb

## 2020-08-07 DIAGNOSIS — M5416 Radiculopathy, lumbar region: Secondary | ICD-10-CM

## 2020-08-07 DIAGNOSIS — E7800 Pure hypercholesterolemia, unspecified: Secondary | ICD-10-CM

## 2020-08-07 DIAGNOSIS — J309 Allergic rhinitis, unspecified: Secondary | ICD-10-CM

## 2020-08-07 MED ORDER — ATORVASTATIN CALCIUM 40 MG PO TABS: 40.0000 mg | ORAL_TABLET | Freq: Every day | ORAL | 3 refills | Status: DC

## 2020-08-07 NOTE — Telephone Encounter (Signed)
Due for med check. Has appt today to be seen for acute

## 2020-08-07 NOTE — Patient Instructions (Addendum)
Take zyrtec 76m at bedtime. Also start taking flonase, using 2 sprays into each nostril once daily.  Be sure to use gentle sniffs only (so you don't swallow/taste the medication).  Aim slightly away from the center when spraying into the nose. If you have any nasal irritation, you can use nasal saline as well, and you can decrease the dose to just 1 spray daily, if still irritating.  If you still have runny nose in 1-2 weeks, despite using both the zyrtec and flonase, please let uKoreaknow.  We have other treatment options we can add.  Consider wearing a mask when out in your garden, versus doing sinus rinses as soon as you come inside (sinus rinse kit or neti-pot).  Your cholesterol looks good, and lipitor was refilled. The rest of your blood work also looked good.  If you back pain isn't improving, likely you'd benefit from seeing one of the pain doctors affiliated either with the orthopedist office or a neurosurgeon's office, for additional back treatments/injections.

## 2020-08-08 ENCOUNTER — Encounter: Payer: Self-pay | Admitting: Family Medicine

## 2020-08-28 ENCOUNTER — Telehealth: Payer: Self-pay

## 2020-08-28 MED ORDER — IPRATROPIUM BROMIDE 0.06 % NA SOLN: NASAL | 2 refills | Status: DC

## 2020-08-28 NOTE — Telephone Encounter (Signed)
Advise pt that I sent in a prescription for atrovent nasal spray.  She can use this 3-4 times daily for runny nose. Continue the zyrtec and flonase (and if her runny resolves on all 3 medications, she can then try cutting back on either the zyrtec or flonase).  For cramps--make sure she is staying well hydrated. Heat and massage to hands in the morning may help.

## 2020-08-28 NOTE — Telephone Encounter (Signed)
Called pt. Left detailed message with all of your recommendations.

## 2020-08-28 NOTE — Telephone Encounter (Signed)
Pt. Calling back was told to call back and let you know how the flonase and zyrtec is helping with her runny nose. She stats she still has runny nose and she has been doing the flonase 2 sprays in each nostril. She said she also has cramping in both of her hands mainly in the a.m. wasn't sure what she could do about that.

## 2020-09-09 ENCOUNTER — Telehealth: Payer: Self-pay

## 2020-09-09 NOTE — Telephone Encounter (Signed)
Pt states she still having issues with cramps in her hands and wanted to see if there was a vitamin or supplement she could take that might help?

## 2020-09-09 NOTE — Telephone Encounter (Signed)
I do not have recommendations for supplements.  It is important that she stay very well hydrated throughout the day (not drinking a lot just at bedtime, which will interfere with her sleep).  Prior electrolytes have been normal (last checked in [facility]), which makes me not recommend any particular vitamin.  If having more problems, she can return for an OV and we can examine and check labs.

## 2020-09-16 ENCOUNTER — Ambulatory Visit (INDEPENDENT_AMBULATORY_CARE_PROVIDER_SITE_OTHER): Payer: Medicare Other | Admitting: Family Medicine

## 2020-09-16 ENCOUNTER — Encounter: Payer: Self-pay | Admitting: Family Medicine

## 2020-09-16 ENCOUNTER — Other Ambulatory Visit: Payer: Self-pay

## 2020-09-16 VITALS — BP 130/72 | HR 68 | Temp 98.8°F | Ht 59.0 in | Wt 120.8 lb

## 2020-09-16 DIAGNOSIS — Z23 Encounter for immunization: Secondary | ICD-10-CM

## 2020-09-16 DIAGNOSIS — T148XXA Other injury of unspecified body region, initial encounter: Secondary | ICD-10-CM | POA: Diagnosis not present

## 2020-09-16 DIAGNOSIS — L089 Local infection of the skin and subcutaneous tissue, unspecified: Secondary | ICD-10-CM | POA: Diagnosis not present

## 2020-09-16 DIAGNOSIS — H9201 Otalgia, right ear: Secondary | ICD-10-CM | POA: Diagnosis not present

## 2020-09-16 DIAGNOSIS — L03116 Cellulitis of left lower limb: Secondary | ICD-10-CM | POA: Diagnosis not present

## 2020-09-16 MED ORDER — MUPIROCIN 2 % EX OINT: 1.0000 "application " | TOPICAL_OINTMENT | Freq: Three times a day (TID) | CUTANEOUS | 1 refills | Status: DC

## 2020-09-16 NOTE — Patient Instructions (Signed)
  Use the mupirocin ointment two to three times daily to the open portions of the skin.  I marked the area of redness with pen. If you see the redness spreading much larger, out of the marked area, this is a sign that we need to switch you to an oral antibiotic instead of just using the topical medication. If you develop any fever, please contact us.

## 2020-09-16 NOTE — Progress Notes (Signed)
Chief Complaint  Patient presents with   Wound Infection    Was moving furniture two weeks ago and bed frame cut her left shin, thinks she may have an infection now.    Ear Pain    Also gets some pain on the outside of her right ear from time to time.    2 weeks ago, while cleaning in her home, she hit the bedframe with her left shin. It bled some.  It has been bleeding off and on for 2 weeks. It finally stopped bleeding today. 2-3 days ago she noticed increasing redness and some drainage (she reports looked like pus).  It looked redder yesterday than it does today.  No fever or chills  She is also reporting occasionally having some pain at her right ear.  It is on the outside of the ear.  It isn't bothering her at all today.  It isn't like an ear ache, no pain with chewing or headaches.  PMH, PSH, SH reviewed  Outpatient Encounter Medications as of 09/16/2020  Medication Sig Note   atorvastatin (LIPITOR) 40 MG tablet Take 1 tablet (40 mg total) by mouth at bedtime.    Biotin 10000 MCG TABS Take 10,000 mcg by mouth daily.    Calcium Carbonate-Vitamin D (CALTRATE 600+D PO) Take 1 tablet by mouth daily.    cetirizine (ZYRTEC) 10 MG tablet Take 10 mg by mouth daily.    fluticasone (FLONASE) 50 MCG/ACT nasal spray Place 2 sprays into both nostrils daily.    ipratropium (ATROVENT) 0.06 % nasal spray Place 2 sprays into each nostril 3-4 times daily for runny nose    metoprolol succinate (TOPROL-XL) 50 MG 24 hr tablet Take 1 tablet (50 mg total) by mouth daily. Take with or immediately following a meal.    Multiple Vitamin (MULTIVITAMIN WITH MINERALS) TABS tablet Take 1 tablet by mouth daily. CENTRUM MULTIVITAMIN    Probiotic Product (PROBIOTIC DAILY PO) Take 2 each by mouth daily. 01/24/2020: Gummy   docusate sodium (COLACE) 100 MG capsule Take 1 capsule (100 mg total) by mouth 2 (two) times daily. (Patient not taking: No sig reported)    No facility-administered encounter medications on file  as of 09/16/2020.   No Known Allergies  ROS: No fever, chills, nausea or vomiting, diarrhea. No URI symptoms. No headaches, dizziness, chest pain, edema. Left shoulder pain--seeing [facility] tomorrow for evaluation   PHYSICAL EXAM:  BP 130/72   Pulse 68   Temp 98.8 F (37.1 C) (Tympanic)   Ht 4\' 11"  (1.499 m)   Wt 120 lb 12.8 oz (54.8 kg)   BMI 24.40 kg/m   Well-appearing, pleasant female, in no distress HEENT: conjunctiva and sclera are clear, EOMI. External R ear is normal.  Left lower shin-- 1.5 cm skin tear, with the skin already re-approximated, with no active bleeding. There is scab/eschar at lower portion of the wound. Medial to this is an area that is 0.5cm, white, but no active drainage, mildly tender. There is mild erythema (faint pink) measuring 3 x 3 cm surrounding these areas No streaking, no soft tissue swelling, no fluctuance No edema   ASSESSMENT/PLAN:   Infected skin tear - reviewed wound care, very gentle cleaning. Bactroban x 7-10d.  Need for COVID-19 vaccine - Plan: Moderna Covid-19 Booster  Cellulitis of left lower extremity - Minimal erythema. Area marked.  To contact us to change to oral antibiotic if redness increases/spreads - Plan: mupirocin ointment (BACTROBAN) 2 %  Right ear pain - external, intermittent.  Currently without pain. Ddx reviewed, pt reassurred. To return if pain recurs/persists for evaluation  Keep the area clean (wash with soap and water)--be very gentle (pat/dab) so as not to disrupt the skin tear and restart bleeding.  Use the mupirocin ointment two to three times daily to the open portions of the skin.  I marked the area of redness with pen. If you see the redness spreading much larger, out of the marked area, this is a sign that we need to switch you to an oral antibiotic instead of just using the topical medication. If you develop any fever, please contact us.

## 2020-10-07 ENCOUNTER — Telehealth: Payer: Self-pay | Admitting: *Deleted

## 2020-10-07 NOTE — Telephone Encounter (Signed)
She has had both knees replaced and 1 hip replaced.  Is she having significant pain and need to use cane or other assistive device?  Is she seeing orthopedist regularly?  These are part of the information I would need to know in order to fill out placard (vs have her ortho fill it out if she is currently under their care for pain issues).  As far as other recommendations for runny nose, no I provided her with things that apparently helped some.  It is her choice to decide which is more annoying--the runny nose or taking the medications.

## 2020-10-07 NOTE — Telephone Encounter (Signed)
Spoke with patient and she said she does have pain off and on from her surgeries. She does not use a cane, walker or any assistive devices. And she sees ortho 2-3 times a year.

## 2020-10-07 NOTE — Telephone Encounter (Signed)
Patient advised.

## 2020-10-07 NOTE — Telephone Encounter (Signed)
Patient called and has always had a [place] driver's license and had her car registered in [place]. She has also always had a handicapped placard. She is now going to switch everything over to [facility] and is asking for a handicapped placard from you for her knee and hip replacements. Also wanted to let you know she will be stopping the flonase and zyrtec-nose running is some better but not completely and it's getting annoying to take all these medications. Asking if you have any other recommendations?

## 2020-10-07 NOTE — Telephone Encounter (Signed)
As far as I can tell, she does not meet the criteria listed on the forms for this. Since she has an upcoming appt with her ortho, she can address with them.

## 2020-10-17 ENCOUNTER — Telehealth: Payer: Self-pay | Admitting: Family Medicine

## 2020-10-17 NOTE — Telephone Encounter (Signed)
Patient advised.

## 2020-10-17 NOTE — Telephone Encounter (Signed)
I believe that she stopped her allergy medications, so we cannot blame the flonase or antihistamine for causing these nosebleeds. I don't believe she is taking any aspirin or anti-inflammatories.  She should use nasal saline spray to keep the mucosa moist. If she is having congestion, she should resume her allergy medications (but be sure to aim the flonase spray away from the septum).  As long as she can stop the nosebleeds, I'm not concerned.

## 2020-10-17 NOTE — Telephone Encounter (Signed)
Pt called she has had nose bleed on Mondday, Wednesday & today, lasted about 10 minutes  She wanted to check with you to see if that is anything she needs to be concerned about

## 2020-11-18 ENCOUNTER — Ambulatory Visit: Payer: Medicare Other | Admitting: Family Medicine

## 2020-12-03 ENCOUNTER — Telehealth: Payer: Self-pay

## 2020-12-03 NOTE — Telephone Encounter (Signed)
   [facility] Pre-operative Risk Assessment    Patient Name: Alexis Hines  DOB: 05-22-1933 MRN: [id number]  HEARTCARE STAFF:  - IMPORTANT!!!!!! Under Visit Info/Reason for Call, type in Other and utilize the format Clearance MM/DD/YY or Clearance TBD. Do not use dashes or single digits. - Please review there is not already an duplicate clearance open for this procedure. - If request is for dental extraction, please clarify the # of teeth to be extracted. - If the patient is currently at the dentist's office, call Pre-Op Callback Staff (MA/nurse) to input urgent request.  - If the patient is not currently in the dentist office, please route to the Pre-Op pool.  Request for surgical clearance:  What type of surgery is being performed? Left Reverse Shoulder Arthoplasty  When is this surgery scheduled? September 29, 22  What type of clearance is required (medical clearance vs. Pharmacy clearance to hold med vs. Both)? Medical  Are there any medications that need to be held prior to surgery and how long? None  Practice name and name of physician performing surgery? [facility], Dr. Justice Britain  What is the office phone number? [phone]   7.   What is the office fax number? [phone]  9.   Anesthesia type (None, local, MAC, general) ? General   Monia Pouch 12/03/2020, 3:25 PM  _________________________________________________________________   (provider comments below)

## 2020-12-04 NOTE — Telephone Encounter (Signed)
Patient returning call.

## 2020-12-04 NOTE — Telephone Encounter (Signed)
Left VM  Nonischemic nuclear stress test in 2019. No cardiac complications during hip surgery 09/2019. As of 01/2020 was exercising regularly.

## 2020-12-04 NOTE — Telephone Encounter (Signed)
Name: Alexis Hines  DOB: 09-Apr-1933  MRN: [id number]   Primary Cardiologist: Little Ishikawa, MD  Chart reviewed as part of pre-operative protocol coverage. Patient was contacted 12/04/2020 in reference to pre-operative risk assessment for pending surgery as outlined below.  Alexis Hines was last seen in Dec 2021 by Dr. Armanda Magic.  Since that day, Alexis Hines has done well. She had a reassuring nuclear stress in 2019. She exercises 4 times weekly without angina.   Therefore, based on ACC/AHA guidelines, the patient would be at acceptable risk for the planned procedure without further cardiovascular testing.   The patient was advised that if she develops new symptoms prior to surgery to contact our office to arrange for a follow-up visit, and she verbalized understanding.  I will route this recommendation to the requesting party via Epic fax function and remove from pre-op pool. Please call with questions.  Roe Rutherford Darcia Lampi, PA 12/04/2020, 4:19 PM

## 2020-12-09 ENCOUNTER — Institutional Professional Consult (permissible substitution): Payer: Medicare Other | Admitting: Family Medicine

## 2020-12-10 ENCOUNTER — Encounter (HOSPITAL_COMMUNITY)
Admission: RE | Admit: 2020-12-10 | Discharge: 2020-12-10 | Disposition: A | Discharge status: Home / Self Care | Payer: Medicare Other | Source: Ambulatory Visit | Attending: Orthopedic Surgery | Admitting: Orthopedic Surgery

## 2020-12-10 ENCOUNTER — Other Ambulatory Visit: Payer: Self-pay

## 2020-12-10 ENCOUNTER — Encounter (HOSPITAL_COMMUNITY): Payer: Self-pay

## 2020-12-10 DIAGNOSIS — Z01812 Encounter for preprocedural laboratory examination: Secondary | ICD-10-CM | POA: Diagnosis present

## 2020-12-10 LAB — BASIC METABOLIC PANEL
Anion gap: 7 (ref 5–15)
BUN: 18 mg/dL (ref 8–23)
CO2: 31 mmol/L (ref 22–32)
Calcium: 9.6 mg/dL (ref 8.9–10.3)
Chloride: 105 mmol/L (ref 98–111)
Creatinine, Ser: 0.73 mg/dL (ref 0.44–1.00)
GFR, Estimated: >60 mL/min (ref >60)
Glucose, Bld: 84 mg/dL (ref 70–99)
Potassium: 4.3 mmol/L (ref 3.5–5.1)
Sodium: 143 mmol/L (ref 135–145)

## 2020-12-10 LAB — CBC
HCT: 45.5 % (ref 36.0–46.0)
Hemoglobin: 15 g/dL (ref 12.0–15.0)
MCH: 31 pg (ref 26.0–34.0)
MCHC: 33 g/dL (ref 30.0–36.0)
MCV: 94 fL (ref 80.0–100.0)
Platelets: 285 10*3/uL (ref 150–400)
RBC: 4.84 MIL/uL (ref 3.87–5.11)
RDW: 12.6 % (ref 11.5–15.5)
WBC: 9 10*3/uL (ref 4.0–10.5)
nRBC: 0 % (ref 0.0–0.2)

## 2020-12-10 NOTE — Progress Notes (Addendum)
Anesthesia Review:  PCP: DR Aris [place] Clearance on chart dated 12/12/20.   Cardiologist :clearaance- angela Duke,PA 12/04/20 on chart  DR Oswaldo Milian  Chest x-ray : EKG :02/29/2020  Echo : 08/31/2019  Stress test: Cardiac Cath :  Activity level: cannot do a flight of stairs without difficulty  Sleep Study/ CPAP : none  Fasting Blood Sugar :      / Checks Blood Sugar -- times a day:   Blood Thinner/ Instructions /Last Dose: ASA / Instructions/ Last Dose :   Covid test on 12/23/20  Pt had questions concerning the wording of her consent at time of preop on 12/10/20.  Instructed pt to call and speak with Surgery scheduler.  Informed pt that consent could be signed day of surgery.

## 2020-12-10 NOTE — Progress Notes (Signed)
DUE TO COVID-19 ONLY ONE VISITOR IS ALLOWED TO COME WITH YOU AND STAY IN THE WAITING ROOM ONLY DURING PRE OP AND PROCEDURE DAY OF SURGERY.  2 VISITOR  MAY VISIT WITH YOU AFTER SURGERY IN YOUR PRIVATE ROOM DURING VISITING HOURS ONLY!  YOU NEED TO HAVE A COVID 19 TEST ON__9/26/2022 ____'@_'$  '@_from'$  8am-3pm _____, THIS TEST MUST BE DONE BEFORE SURGERY,  Covid test is done at [place], [place] [address].  This is a drive thru.  No appt required. Please see map.                 Your procedure is scheduled on:  12/26/2020  Report to [facility] Main  Entrance   Report to admitting at    1215 pm    Call this number if you have problems the morning of surgery [phone]    REMEMBER: NO  SOLID FOOD CANDY OR GUM AFTER MIDNIGHT. CLEAR LIQUIDS UNTIL 1200 noon         . NOTHING BY MOUTH EXCEPT CLEAR LIQUIDS UNTIL 1200 noon    . PLEASE FINISH ENSURE DRINK PER SURGEON ORDER  WHICH NEEDS TO BE COMPLETED AT 1200 noon      .      CLEAR LIQUID DIET   Foods Allowed                                                                    Coffee and tea, regular and decaf                            Fruit ices (not with fruit pulp)                                      Iced Popsicles                                    Carbonated beverages, regular and diet                                    Cranberry, grape and apple juices Sports drinks like Gatorade Lightly seasoned clear broth or consume(fat free) Sugar, honey syrup ___________________________________________________________________      BRUSH YOUR TEETH MORNING OF SURGERY AND RINSE YOUR MOUTH OUT, NO CHEWING GUM CANDY OR MINTS.     Take these medicines the morning of surgery with A SIP OF WATER: zyrtec, toprol   DO NOT TAKE ANY DIABETIC MEDICATIONS DAY OF YOUR SURGERY                               You may not have any metal on your body including hair pins and              piercings  Do not wear jewelry, make-up, lotions,  powders or perfumes, deodorant             Do not  wear nail polish on your fingernails.  Do not shave  48 hours prior to surgery.              Men may shave face and neck.   Do not bring valuables to the hospital. [place].  Contacts, dentures or bridgework may not be worn into surgery.  Leave suitcase in the car. After surgery it may be brought to your room.     Patients discharged the day of surgery will not be allowed to drive home. IF YOU ARE HAVING SURGERY AND GOING HOME THE SAME DAY, YOU MUST HAVE AN ADULT TO DRIVE YOU HOME AND BE WITH YOU FOR 24 HOURS. YOU MAY GO HOME BY TAXI OR UBER OR ORTHERWISE, BUT AN ADULT MUST ACCOMPANY YOU HOME AND STAY WITH YOU FOR 24 HOURS.  Name and phone number of your driver:  Special Instructions: N/A              Please read over the following fact sheets you were given: _____________________________________________________________________  [facility] - Preparing for Surgery Before surgery, you can play an important role.  Because skin is not sterile, your skin needs to be as free of germs as possible.  You can reduce the number of germs on your skin by washing with CHG (chlorahexidine gluconate) soap before surgery.  CHG is an antiseptic cleaner which kills germs and bonds with the skin to continue killing germs even after washing. Please DO NOT use if you have an allergy to CHG or antibacterial soaps.  If your skin becomes reddened/irritated stop using the CHG and inform your nurse when you arrive at Short Stay. Do not shave (including legs and underarms) for at least 48 hours prior to the first CHG shower.  You may shave your face/neck. Please follow these instructions carefully:  1.  Shower with CHG Soap the night before surgery and the  morning of Surgery.  2.  If you choose to wash your hair, wash your hair first as usual with your  normal  shampoo.  3.  After you shampoo, rinse your hair and body  thoroughly to remove the  shampoo.                           4.  Use CHG as you would any other liquid soap.  You can apply chg directly  to the skin and wash                       Gently with a scrungie or clean washcloth.  5.  Apply the CHG Soap to your body ONLY FROM THE NECK DOWN.   Do not use on face/ open                           Wound or open sores. Avoid contact with eyes, ears mouth and genitals (private parts).                       Wash face,  Genitals (private parts) with your normal soap.             6.  Wash thoroughly, paying special attention to the area where your surgery  will be performed.  7.  Thoroughly rinse your body with warm water  from the neck down.  8.  DO NOT shower/wash with your normal soap after using and rinsing off  the CHG Soap.                9.  Pat yourself dry with a clean towel.            10.  Wear clean pajamas.            11.  Place clean sheets on your bed the night of your first shower and do not  sleep with pets. Day of Surgery : Do not apply any lotions/deodorants the morning of surgery.  Please wear clean clothes to the hospital/surgery center.  FAILURE TO FOLLOW THESE INSTRUCTIONS MAY RESULT IN THE CANCELLATION OF YOUR SURGERY PATIENT SIGNATURE_________________________________  NURSE SIGNATURE__________________________________  ________________________________________________________________________              [facility]- Preparing for Total Shoulder Arthroplasty    Before surgery, you can play an important role. Because skin is not sterile, your skin needs to be as free of germs as possible. You can reduce the number of germs on your skin by using the following products. Benzoyl Peroxide Gel Reduces the number of germs present on the skin Applied twice a day to shoulder area starting two days before surgery    ==================================================================  Please follow these instructions carefully:  BENZOYL  PEROXIDE 5% GEL  Please do not use if you have an allergy to benzoyl peroxide.   If your skin becomes reddened/irritated stop using the benzoyl peroxide.  Starting two days before surgery, apply as follows: Apply benzoyl peroxide in the morning and at night. Apply after taking a shower. If you are not taking a shower clean entire shoulder front, back, and side along with the armpit with a clean wet washcloth.  Place a quarter-sized dollop on your shoulder and rub in thoroughly, making sure to cover the front, back, and side of your shoulder, along with the armpit.   2 days before ____ AM   ____ PM              1 day before ____ AM   ____ PM                         Do this twice a day for two days.  (Last application is the night before surgery, AFTER using the CHG soap as described below).  Do NOT apply benzoyl peroxide gel on the day of surgery.

## 2020-12-11 LAB — SURGICAL PCR SCREEN
MRSA, PCR: POSITIVE — AB
Staphylococcus aureus: POSITIVE — AB

## 2020-12-11 NOTE — Progress Notes (Signed)
Chief Complaint  Patient presents with   Medical Clearance    Clearance for left shoulder surgery on 12/26/20 with Dr. Onnie Graham at [facility]. Would like to wait for flu shot. Asking for a refill on her nasal spray.    She is scheduled for left shoulder arthoplasty with Dr. Onnie Graham on 12/26/20. She didn't get benefit from cortisone shot, was told it was "bone on bone", and recommended surgery.  She reports the pain wakes her up at night.  She goes to exercise class 3x/week, which doesn't bother her.  Has some discomfort during the day.  When she is woken up with pain, she takes advil or 2 Aleve with some food.  It helps to sleep on her right side, but hurts when she rolls onto the left side. During the day the pain doesn't persist as long, doesn't usually require medication.  Tylenol is ineffective in treating the pain.  She presents today for medical clearance.  She has her main PCP in [place], and mainly comes here for acute visits, though keeps [place] updated on everything.  She spoke with cardiology, who didn't recommend any testing prior to surgery. She had nonischemic nuclear stress test in [place] in April 2019. No cardiac complications during hip surgery 09/2019, or with R total knee replacement in 2019.  SVT and frequent PAC's--noted on Zio patch in 08/2019.  She remains on Toprol XL per cardiology, and she denies palpitations, chest pain, shortness of breath.  Hyperlipidemia.  She is compliant with low cholesterol diet and atorvastatin, and denies side effects. She had normal labs in 05/2020 per PCP in [place] (see scanned labs). Lipids then: TC 186, HDL 76, LDL 90, TG 99. Lab Results  Component Value Date   CHOL 181 11/09/2019   HDL 62 11/09/2019   LDLCALC 98 11/09/2019   TRIG 117 11/09/2019   CHOLHDL 2.9 11/09/2019   She reports she feels like she isn't hearing as well from the R ear, thinks it may be blocked, wants it checked.  Still has runny nose.  Ipratroprium added to zyrtec and flonase  helped, but she doesn't take it any more.  She didn't like using so many medicines.  The runny nose really bothered her brother (during recent visit), not the patient so much.  Asking for RF of ipratropium. Review of chart--rx'd in June with 2 refills, pt states she never picked up any refills.   Immunization History  Administered Date(s) Administered   Fluad Quad(high Dose 65+) 01/24/2020   H1N1 01/22/2009   Influenza Split 03/01/2011, 01/27/2012   Influenza Whole 01/09/2010   Influenza, High Dose Seasonal PF 01/10/2013, 12/26/2013, 01/16/2015, 12/26/2015, 02/03/2017, 01/03/2018, 01/26/2019   Moderna SARS-COV2 Booster Vaccination 09/16/2020   Moderna Sars-Covid-2 Vaccination 05/30/2019, 06/27/2019, 04/18/2020   Pneumococcal Conjugate-13 09/30/2015   Pneumococcal Polysaccharide-23 10/29/2002, 05/29/2011   Tdap 03/15/2008, 04/26/2017   Zoster Recombinat (Shingrix) 12/26/2019, 02/24/2020   Zoster, Live 03/28/2009   PMH, PSH, SH and FH reviewed  Outpatient Encounter Medications as of 12/12/2020  Medication Sig Note   atorvastatin (LIPITOR) 40 MG tablet Take 1 tablet (40 mg total) by mouth at bedtime.    Biotin 10000 MCG TABS Take 10,000 mcg by mouth in the morning.    Calcium Carbonate-Vitamin D (CALTRATE 600+D PO) Take 1 tablet by mouth daily.    MELATONIN PO Take 1 tablet by mouth at bedtime as needed (sleep). 12/12/2020: Uses sometimes   metoprolol succinate (TOPROL-XL) 50 MG 24 hr tablet Take 1 tablet (50 mg total) by  mouth daily. Take with or immediately following a meal.    Multiple Vitamin (MULTIVITAMIN WITH MINERALS) TABS tablet Take 1 tablet by mouth in the morning. CENTRUM MULTIVITAMIN    naproxen sodium (ALEVE) 220 MG tablet Take 440 mg by mouth daily as needed (shoulder pain). 12/12/2020: Last dose last night   Omega-3 Fatty Acids (FISH OIL) 1000 MG CAPS Take 1 capsule by mouth daily.    Probiotic Product (PROBIOTIC DAILY) CAPS Take 1 capsule by mouth daily with lunch.     Turmeric 400 MG CAPS Take 1 capsule by mouth daily.    Wheat Dextrin (BENEFIBER PO) Take by mouth.    docusate sodium (COLACE) 100 MG capsule Take 1 capsule (100 mg total) by mouth 2 (two) times daily. (Patient not taking: No sig reported)    ipratropium (ATROVENT) 0.06 % nasal spray Place 2 sprays into each nostril 3-4 times daily for runny nose (Patient not taking: Reported on 12/12/2020)    mupirocin ointment (BACTROBAN) 2 % Apply 1 application topically 3 (three) times daily. (Patient not taking: Reported on 12/12/2020)    No facility-administered encounter medications on file as of 12/12/2020.   No Known Allergies  ROS: The patient denies fever, weight changes, headaches,  vision changes, sore throat, breast concerns, chest pain, palpitations, dizziness, syncope, dyspnea on exertion, cough, swelling, nausea, vomiting, diarrhea, constipation, abdominal pain, melena, hematochezia, indigestion/heartburn, hematuria, incontinence, dysuria, vaginal discharge, odor or itch. She denies numbness, tingling, weakness, tremor, suspicious skin lesions, depression, anxiety, abnormal bleeding, or enlarged lymph nodes. Occasional constipation. Left shoulder pain per HPI Wants right ear checked, intermittent plugging/decreased hearing Had injection for spinal stenosis back in 06/2020, and pain in RLE improved.  Has slight pain recurring.    PHYSICAL EXAM:  BP 130/70   Pulse 72   Ht '4\' 11"'  (1.499 m)   Wt 123 lb 6.4 oz (56 kg)   BMI 24.92 kg/m   Wt Readings from Last 3 Encounters:  12/12/20 123 lb 6.4 oz (56 kg)  12/10/20 121 lb (54.9 kg)  09/16/20 120 lb 12.8 oz (54.8 kg)   Well-appearing, pleasant female, in good spirits, in no distress HEENT: conjunctiva and sclera clear, EOMI, wearing mask. TM's and EAC's are normal bilaterally.  No effusion, erythema, no cerumen in canal. Neck: no lymphadenopathy, thyromegaly or mass, no carotid bruit Heart: regular rate and rhythm, no murmur. Occasional ectopic  beat noted. Lungs: clear bilaterally, no wheezes, rales ronchi Abdomen: soft, nontender, no organomegaly or mass Back: no spinal or CVA tenderness Extremities: no edema, normal pulses Psych: normal mood, affect, hygiene, grooming, speech, eye contact Neuro: alert and oriented, normal gait, strength   Had pre-op labs done yesterday-- Normal cbc and b-met, MRSA +   ASSESSMENT/PLAN:  Problem List Items Addressed This Visit       Unprioritized   Chronic rhinitis    Improved previously with zyrtec, claritin, ipratropium. She wants to restart ipratropium only at this time (add others if needed).  She still has refills left at pharmacy. Reassured that her R ear was normal, no cerumen. Poss ETD and treating congestion/allergies may help.      Osteoarthritis of both shoulders    L>R.  It sounds like radiographically it is severe, but symptoms and pain do not sound severe--pain is mainly at night and managed with occasional NSAID. D/W pt in detail, and she prefers surgery, as is scheduled with Dr. Onnie Graham for later this month.      Paroxysmal SVT (supraventricular tachycardia) (HCC)  Patient is doing well on Toprol XL per cardiology.  She had SVT and frequent PVC's noted on Zio patch 08/2019.  A few ectopic beats noted on exam, patient asymptomatic. Cont BB      Pure hypercholesterolemia    Lipids at goal per 05/2020 labs with PCP in [place].  Cont atorvastatin      Other Visit Diagnoses     Pre-operative clearance    -  Primary       Form filled out--cleared for surgery. Flu shot recommended--pt prefers to get at another time COVID booster also recommended and discussed in detail (timing, to get same day as flu shot or 2 weeks apart)  MRSA+ on screen per pre-op eval.  Discussed that she likely will be contacted for treatment, and discussed briefly what that would entail.  Discussed stopping fish oil, as well as NSAIDs, prior to her surgery.

## 2020-12-12 ENCOUNTER — Other Ambulatory Visit: Payer: Self-pay

## 2020-12-12 ENCOUNTER — Ambulatory Visit (INDEPENDENT_AMBULATORY_CARE_PROVIDER_SITE_OTHER): Payer: Medicare Other | Admitting: Family Medicine

## 2020-12-12 ENCOUNTER — Encounter: Payer: Self-pay | Admitting: Family Medicine

## 2020-12-12 VITALS — BP 130/70 | HR 72 | Ht 59.0 in | Wt 123.4 lb

## 2020-12-12 DIAGNOSIS — Z01818 Encounter for other preprocedural examination: Secondary | ICD-10-CM

## 2020-12-12 DIAGNOSIS — J310 Chronic rhinitis: Secondary | ICD-10-CM | POA: Diagnosis not present

## 2020-12-12 DIAGNOSIS — M19012 Primary osteoarthritis, left shoulder: Secondary | ICD-10-CM

## 2020-12-12 DIAGNOSIS — M19011 Primary osteoarthritis, right shoulder: Secondary | ICD-10-CM

## 2020-12-12 DIAGNOSIS — E7800 Pure hypercholesterolemia, unspecified: Secondary | ICD-10-CM

## 2020-12-12 DIAGNOSIS — I471 Supraventricular tachycardia: Secondary | ICD-10-CM

## 2020-12-12 NOTE — Patient Instructions (Addendum)
Consider a pneumovax 23 booster in 05/2021 (last was 05/2011).  I encourage you to get the new COVID booster.  You may get either [employer] or [place], your choice.  I don't think the pharmacies have the [place] yet, but you can check with him. You should separate the COVID vaccine from the flu shot by 2 weeks, or get them the same day.  You may get either of these shots at the pharmacy, or at our office (we do not yet have the new COVID vaccines).  You should have refills of the ipratroprium nasal spray at your pharmacy.  Your screening test for MRSA (nasal swab) was positive. Be sure to check the information you have or touch base with the orthopedists regarding regimen to treat this prior to surgery

## 2020-12-13 NOTE — Progress Notes (Signed)
PT called into Scheduler and had a question regarding preop instructions.  Called home number and there was no answer.  Called cell phone number and LVMM with return number of [phone].

## 2020-12-14 DIAGNOSIS — I471 Supraventricular tachycardia: Secondary | ICD-10-CM | POA: Insufficient documentation

## 2020-12-14 DIAGNOSIS — M19012 Primary osteoarthritis, left shoulder: Secondary | ICD-10-CM | POA: Insufficient documentation

## 2020-12-14 DIAGNOSIS — J310 Chronic rhinitis: Secondary | ICD-10-CM | POA: Insufficient documentation

## 2020-12-14 DIAGNOSIS — M19011 Primary osteoarthritis, right shoulder: Secondary | ICD-10-CM | POA: Insufficient documentation

## 2020-12-14 NOTE — Assessment & Plan Note (Signed)
L>R.  It sounds like radiographically it is severe, but symptoms and pain do not sound severe--pain is mainly at night and managed with occasional NSAID. D/W pt in detail, and she prefers surgery, as is scheduled with Dr. Onnie Graham for later this month.

## 2020-12-14 NOTE — Assessment & Plan Note (Signed)
Improved previously with zyrtec, claritin, ipratropium. She wants to restart ipratropium only at this time (add others if needed).  She still has refills left at pharmacy. Reassured that her R ear was normal, no cerumen. Poss ETD and treating congestion/allergies may help.

## 2020-12-14 NOTE — Assessment & Plan Note (Signed)
Lipids at goal per 05/2020 labs with PCP in [place].  Cont atorvastatin

## 2020-12-14 NOTE — Assessment & Plan Note (Signed)
Patient is doing well on Toprol XL per cardiology.  She had SVT and frequent PVC's noted on Zio patch 08/2019.  A few ectopic beats noted on exam, patient asymptomatic. Cont BB

## 2020-12-23 ENCOUNTER — Other Ambulatory Visit: Payer: Self-pay | Admitting: Orthopedic Surgery

## 2020-12-24 LAB — SARS CORONAVIRUS 2 (TAT 6-24 HRS): SARS Coronavirus 2: NEGATIVE

## 2020-12-26 ENCOUNTER — Ambulatory Visit (HOSPITAL_COMMUNITY): Payer: Medicare Other | Admitting: Anesthesiology

## 2020-12-26 ENCOUNTER — Ambulatory Visit (HOSPITAL_COMMUNITY): Payer: Medicare Other | Admitting: Physician Assistant

## 2020-12-26 ENCOUNTER — Other Ambulatory Visit: Payer: Self-pay

## 2020-12-26 ENCOUNTER — Encounter (HOSPITAL_COMMUNITY)
Admission: RE | Disposition: A | Discharge status: Home / Self Care | Payer: Self-pay | Source: Ambulatory Visit | Attending: Orthopedic Surgery

## 2020-12-26 ENCOUNTER — Observation Stay (HOSPITAL_COMMUNITY)
Admission: RE | Admit: 2020-12-26 | Discharge: 2020-12-27 | Disposition: A | Discharge status: Home / Self Care | Payer: Medicare Other | Source: Ambulatory Visit | Attending: Orthopedic Surgery | Admitting: Orthopedic Surgery

## 2020-12-26 ENCOUNTER — Encounter (HOSPITAL_COMMUNITY): Payer: Self-pay | Admitting: Orthopedic Surgery

## 2020-12-26 DIAGNOSIS — Z85828 Personal history of other malignant neoplasm of skin: Secondary | ICD-10-CM | POA: Insufficient documentation

## 2020-12-26 DIAGNOSIS — Z96641 Presence of right artificial hip joint: Secondary | ICD-10-CM | POA: Diagnosis not present

## 2020-12-26 DIAGNOSIS — Z96653 Presence of artificial knee joint, bilateral: Secondary | ICD-10-CM | POA: Insufficient documentation

## 2020-12-26 DIAGNOSIS — Z79899 Other long term (current) drug therapy: Secondary | ICD-10-CM | POA: Insufficient documentation

## 2020-12-26 DIAGNOSIS — Z96612 Presence of left artificial shoulder joint: Secondary | ICD-10-CM

## 2020-12-26 DIAGNOSIS — M19012 Primary osteoarthritis, left shoulder: Principal | ICD-10-CM | POA: Insufficient documentation

## 2020-12-26 HISTORY — PX: REVERSE SHOULDER ARTHROPLASTY: SHX5054

## 2020-12-26 SURGERY — ARTHROPLASTY, SHOULDER, TOTAL, REVERSE
Anesthesia: General | Site: Shoulder | Laterality: Left

## 2020-12-26 MED ORDER — ONDANSETRON HCL 4 MG PO TABS: 4.0000 mg | ORAL_TABLET | Freq: Four times a day (QID) | ORAL | Status: DC | PRN

## 2020-12-26 MED ORDER — DEXAMETHASONE SODIUM PHOSPHATE 4 MG/ML IJ SOLN
INTRAMUSCULAR | Status: DC | PRN
  Administered 2020-12-26: 6 mg via INTRAVENOUS

## 2020-12-26 MED ORDER — EPHEDRINE SULFATE 50 MG/ML IJ SOLN
INTRAMUSCULAR | Status: DC | PRN
  Administered 2020-12-26: 5 mg via INTRAVENOUS

## 2020-12-26 MED ORDER — ORAL CARE MOUTH RINSE: 15.0000 mL | Freq: Once | OROMUCOSAL | Status: AC

## 2020-12-26 MED ORDER — PROPOFOL 10 MG/ML IV BOLUS
INTRAVENOUS | Status: DC | PRN
  Administered 2020-12-26: 30 mg via INTRAVENOUS
  Administered 2020-12-26: 120 mg via INTRAVENOUS
  Administered 2020-12-26: 50 mg via INTRAVENOUS

## 2020-12-26 MED ORDER — TRANEXAMIC ACID-NACL 1000-0.7 MG/100ML-% IV SOLN
1000.0000 mg | INTRAVENOUS | Status: AC
  Administered 2020-12-26: 1000 mg via INTRAVENOUS
  Filled 2020-12-26: qty 100

## 2020-12-26 MED ORDER — DIPHENHYDRAMINE HCL 12.5 MG/5ML PO ELIX: 12.5000 mg | ORAL_SOLUTION | ORAL | Status: DC | PRN

## 2020-12-26 MED ORDER — TRANEXAMIC ACID 1000 MG/10ML IV SOLN: 1000.0000 mg | INTRAVENOUS | Status: DC

## 2020-12-26 MED ORDER — ONDANSETRON HCL 4 MG/2ML IJ SOLN
INTRAMUSCULAR | Status: DC | PRN
  Administered 2020-12-26: 4 mg via INTRAVENOUS

## 2020-12-26 MED ORDER — METHOCARBAMOL 500 MG IVPB - SIMPLE MED
500.0000 mg | Freq: Four times a day (QID) | INTRAVENOUS | Status: DC | PRN
  Filled 2020-12-26: qty 50

## 2020-12-26 MED ORDER — LACTATED RINGERS IV SOLN: INTRAVENOUS | Status: DC

## 2020-12-26 MED ORDER — SUGAMMADEX SODIUM 200 MG/2ML IV SOLN
INTRAVENOUS | Status: DC | PRN
  Administered 2020-12-26: 100 mg via INTRAVENOUS

## 2020-12-26 MED ORDER — OXYCODONE HCL 5 MG PO TABS: 5.0000 mg | ORAL_TABLET | ORAL | Status: DC | PRN

## 2020-12-26 MED ORDER — BISACODYL 5 MG PO TBEC: 5.0000 mg | DELAYED_RELEASE_TABLET | Freq: Every day | ORAL | Status: DC | PRN

## 2020-12-26 MED ORDER — FENTANYL CITRATE (PF) 100 MCG/2ML IJ SOLN
INTRAMUSCULAR | Status: DC | PRN
  Administered 2020-12-26 (×2): 50 ug via INTRAVENOUS

## 2020-12-26 MED ORDER — EPHEDRINE 5 MG/ML INJ
INTRAVENOUS | Status: AC
  Filled 2020-12-26: qty 5

## 2020-12-26 MED ORDER — ONDANSETRON HCL 4 MG/2ML IJ SOLN
INTRAMUSCULAR | Status: AC
  Filled 2020-12-26: qty 2

## 2020-12-26 MED ORDER — OXYCODONE HCL 5 MG PO TABS: 5.0000 mg | ORAL_TABLET | Freq: Once | ORAL | Status: DC | PRN

## 2020-12-26 MED ORDER — LABETALOL HCL 5 MG/ML IV SOLN
INTRAVENOUS | Status: DC | PRN
  Administered 2020-12-26: 2.5 mg via INTRAVENOUS

## 2020-12-26 MED ORDER — ACETAMINOPHEN 325 MG PO TABS: 325.0000 mg | ORAL_TABLET | Freq: Four times a day (QID) | ORAL | Status: DC | PRN

## 2020-12-26 MED ORDER — PANTOPRAZOLE SODIUM 40 MG PO TBEC
40.0000 mg | DELAYED_RELEASE_TABLET | Freq: Every day | ORAL | Status: DC
  Filled 2020-12-26: qty 1

## 2020-12-26 MED ORDER — ONDANSETRON HCL 4 MG/2ML IJ SOLN: 4.0000 mg | Freq: Four times a day (QID) | INTRAMUSCULAR | Status: DC | PRN

## 2020-12-26 MED ORDER — BUPIVACAINE-EPINEPHRINE (PF) 0.5% -1:200000 IJ SOLN
INTRAMUSCULAR | Status: DC | PRN
  Administered 2020-12-26: 15 mL via PERINEURAL

## 2020-12-26 MED ORDER — ORAL CARE MOUTH RINSE: 15.0000 mL | Freq: Once | OROMUCOSAL | Status: DC

## 2020-12-26 MED ORDER — MENTHOL 3 MG MT LOZG: 1.0000 | LOZENGE | OROMUCOSAL | Status: DC | PRN

## 2020-12-26 MED ORDER — DOCUSATE SODIUM 100 MG PO CAPS
100.0000 mg | ORAL_CAPSULE | Freq: Two times a day (BID) | ORAL | Status: DC
  Administered 2020-12-26 – 2020-12-27 (×2): 100 mg via ORAL
  Filled 2020-12-26 (×2): qty 1

## 2020-12-26 MED ORDER — VANCOMYCIN HCL IN DEXTROSE 1-5 GM/200ML-% IV SOLN
1000.0000 mg | INTRAVENOUS | Status: AC
  Administered 2020-12-26: 1000 mg via INTRAVENOUS
  Filled 2020-12-26: qty 200

## 2020-12-26 MED ORDER — PHENYLEPHRINE HCL-NACL 20-0.9 MG/250ML-% IV SOLN
INTRAVENOUS | Status: DC | PRN
  Administered 2020-12-26: 25 ug/min via INTRAVENOUS

## 2020-12-26 MED ORDER — PHENOL 1.4 % MT LIQD: 1.0000 | OROMUCOSAL | Status: DC | PRN

## 2020-12-26 MED ORDER — CHLORHEXIDINE GLUCONATE 0.12 % MT SOLN: 15.0000 mL | Freq: Once | OROMUCOSAL | Status: DC

## 2020-12-26 MED ORDER — FENTANYL CITRATE (PF) 100 MCG/2ML IJ SOLN
INTRAMUSCULAR | Status: AC
  Filled 2020-12-26: qty 2

## 2020-12-26 MED ORDER — LIDOCAINE HCL (PF) 2 % IJ SOLN
INTRAMUSCULAR | Status: AC
  Filled 2020-12-26: qty 5

## 2020-12-26 MED ORDER — ONDANSETRON HCL 4 MG/2ML IJ SOLN: 4.0000 mg | Freq: Once | INTRAMUSCULAR | Status: DC | PRN

## 2020-12-26 MED ORDER — OXYCODONE HCL 5 MG PO TABS
10.0000 mg | ORAL_TABLET | ORAL | Status: DC | PRN
Start: 2020-12-26 — End: 2020-12-27

## 2020-12-26 MED ORDER — LIDOCAINE HCL (CARDIAC) PF 100 MG/5ML IV SOSY
PREFILLED_SYRINGE | INTRAVENOUS | Status: DC | PRN
  Administered 2020-12-26: 50 mg via INTRAVENOUS

## 2020-12-26 MED ORDER — FENTANYL CITRATE PF 50 MCG/ML IJ SOSY
50.0000 ug | PREFILLED_SYRINGE | Freq: Once | INTRAMUSCULAR | Status: AC
  Administered 2020-12-26: 50 ug via INTRAVENOUS
  Filled 2020-12-26: qty 2

## 2020-12-26 MED ORDER — LABETALOL HCL 5 MG/ML IV SOLN
INTRAVENOUS | Status: AC
  Filled 2020-12-26: qty 4

## 2020-12-26 MED ORDER — DEXAMETHASONE SODIUM PHOSPHATE 10 MG/ML IJ SOLN
INTRAMUSCULAR | Status: AC
  Filled 2020-12-26: qty 1

## 2020-12-26 MED ORDER — ROCURONIUM BROMIDE 10 MG/ML (PF) SYRINGE
PREFILLED_SYRINGE | INTRAVENOUS | Status: AC
  Filled 2020-12-26: qty 10

## 2020-12-26 MED ORDER — METHOCARBAMOL 500 MG PO TABS: 500.0000 mg | ORAL_TABLET | Freq: Four times a day (QID) | ORAL | Status: DC | PRN

## 2020-12-26 MED ORDER — VANCOMYCIN HCL 1000 MG IV SOLR
INTRAVENOUS | Status: DC | PRN
  Administered 2020-12-26: 1000 mg via TOPICAL

## 2020-12-26 MED ORDER — FENTANYL CITRATE PF 50 MCG/ML IJ SOSY: 25.0000 ug | PREFILLED_SYRINGE | INTRAMUSCULAR | Status: DC | PRN

## 2020-12-26 MED ORDER — PROPOFOL 10 MG/ML IV BOLUS
INTRAVENOUS | Status: AC
  Filled 2020-12-26: qty 20

## 2020-12-26 MED ORDER — STERILE WATER FOR IRRIGATION IR SOLN
Status: DC | PRN
  Administered 2020-12-26: 2000 mL

## 2020-12-26 MED ORDER — OXYCODONE HCL 5 MG/5ML PO SOLN: 5.0000 mg | Freq: Once | ORAL | Status: DC | PRN

## 2020-12-26 MED ORDER — CEFAZOLIN SODIUM-DEXTROSE 2-4 GM/100ML-% IV SOLN
2.0000 g | INTRAVENOUS | Status: AC
  Administered 2020-12-26: 2 g via INTRAVENOUS
  Filled 2020-12-26: qty 100

## 2020-12-26 MED ORDER — POLYETHYLENE GLYCOL 3350 17 G PO PACK
17.0000 g | PACK | Freq: Every day | ORAL | Status: DC | PRN
  Administered 2020-12-26: 17 g via ORAL
  Filled 2020-12-26: qty 1

## 2020-12-26 MED ORDER — HYDROMORPHONE HCL 1 MG/ML IJ SOLN: 0.5000 mg | INTRAMUSCULAR | Status: DC | PRN

## 2020-12-26 MED ORDER — METOCLOPRAMIDE HCL 5 MG PO TABS: 5.0000 mg | ORAL_TABLET | Freq: Three times a day (TID) | ORAL | Status: DC | PRN

## 2020-12-26 MED ORDER — ALUM & MAG HYDROXIDE-SIMETH 200-200-20 MG/5ML PO SUSP: 30.0000 mL | ORAL | Status: DC | PRN

## 2020-12-26 MED ORDER — BUPIVACAINE LIPOSOME 1.3 % IJ SUSP
INTRAMUSCULAR | Status: DC | PRN
  Administered 2020-12-26: 10 mL via PERINEURAL

## 2020-12-26 MED ORDER — VANCOMYCIN HCL 1000 MG IV SOLR
INTRAVENOUS | Status: AC
  Filled 2020-12-26: qty 20

## 2020-12-26 MED ORDER — DIPHENHYDRAMINE HCL 50 MG/ML IJ SOLN
INTRAMUSCULAR | Status: DC | PRN
  Administered 2020-12-26: 12.5 mg via INTRAVENOUS

## 2020-12-26 MED ORDER — CHLORHEXIDINE GLUCONATE 0.12 % MT SOLN
15.0000 mL | Freq: Once | OROMUCOSAL | Status: AC
  Administered 2020-12-26: 15 mL via OROMUCOSAL

## 2020-12-26 MED ORDER — 0.9 % SODIUM CHLORIDE (POUR BTL) OPTIME
TOPICAL | Status: DC | PRN
  Administered 2020-12-26: 1000 mL

## 2020-12-26 MED ORDER — METOCLOPRAMIDE HCL 5 MG/ML IJ SOLN: 5.0000 mg | Freq: Three times a day (TID) | INTRAMUSCULAR | Status: DC | PRN

## 2020-12-26 SURGICAL SUPPLY — 71 items
ADH SKN CLS APL DERMABOND .7 (GAUZE/BANDAGES/DRESSINGS) ×1
AID PSTN UNV HD RSTRNT DISP (MISCELLANEOUS) ×1
BAG COUNTER SPONGE SURGICOUNT (BAG) IMPLANT
BAG SPEC THK2 15X12 ZIP CLS (MISCELLANEOUS) ×1
BAG SPNG CNTER NS LX DISP (BAG)
BAG ZIPLOCK 12X15 (MISCELLANEOUS) ×2 IMPLANT
BLADE SAW SGTL 83.5X18.5 (BLADE) ×2 IMPLANT
BSPLAT GLND +2X24 MDLR (Joint) ×1 IMPLANT
COOLER ICEMAN CLASSIC (MISCELLANEOUS) ×2 IMPLANT
COVER BACK TABLE 60X90IN (DRAPES) ×2 IMPLANT
COVER SURGICAL LIGHT HANDLE (MISCELLANEOUS) ×2 IMPLANT
CUP SUT UNIV REVERS 36 NEUTRAL (Cup) ×1 IMPLANT
DERMABOND ADVANCED (GAUZE/BANDAGES/DRESSINGS) ×1
DERMABOND ADVANCED .7 DNX12 (GAUZE/BANDAGES/DRESSINGS) ×1 IMPLANT
DRAPE INCISE IOBAN 66X45 STRL (DRAPES) IMPLANT
DRAPE ORTHO SPLIT 77X108 STRL (DRAPES) ×4
DRAPE SHEET LG 3/4 BI-LAMINATE (DRAPES) ×2 IMPLANT
DRAPE SURG 17X11 SM STRL (DRAPES) ×2 IMPLANT
DRAPE SURG ORHT 6 SPLT 77X108 (DRAPES) ×2 IMPLANT
DRAPE TOP 10253 STERILE (DRAPES) ×2 IMPLANT
DRAPE U-SHAPE 47X51 STRL (DRAPES) ×2 IMPLANT
DRESSING AQUACEL AG SP 3.5X6 (GAUZE/BANDAGES/DRESSINGS) ×1 IMPLANT
DRSG AQUACEL AG ADV 3.5X10 (GAUZE/BANDAGES/DRESSINGS) IMPLANT
DRSG AQUACEL AG SP 3.5X6 (GAUZE/BANDAGES/DRESSINGS) ×2
DURAPREP 26ML APPLICATOR (WOUND CARE) ×2 IMPLANT
ELECT BLADE TIP CTD 4 INCH (ELECTRODE) ×2 IMPLANT
ELECT REM PT RETURN 15FT ADLT (MISCELLANEOUS) ×2 IMPLANT
FACESHIELD WRAPAROUND (MASK) ×8 IMPLANT
FACESHIELD WRAPAROUND OR TEAM (MASK) ×4 IMPLANT
GLENOID UNI REV MOD 24 +2 LAT (Joint) ×1 IMPLANT
GLENOSPHERE 36 +4 LAT/24 (Joint) ×1 IMPLANT
GLOVE SRG 8 PF TXTR STRL LF DI (GLOVE) ×1 IMPLANT
GLOVE SURG ENC MOIS LTX SZ7 (GLOVE) ×2 IMPLANT
GLOVE SURG ENC MOIS LTX SZ7.5 (GLOVE) ×2 IMPLANT
GLOVE SURG UNDER POLY LF SZ7 (GLOVE) ×2 IMPLANT
GLOVE SURG UNDER POLY LF SZ8 (GLOVE) ×2
GOWN STRL REUS W/TWL LRG LVL3 (GOWN DISPOSABLE) ×4 IMPLANT
KIT BASIN OR (CUSTOM PROCEDURE TRAY) ×2 IMPLANT
KIT TURNOVER KIT A (KITS) ×2 IMPLANT
LINER HUMERAL 36 +3MM SM (Shoulder) ×1 IMPLANT
MANIFOLD NEPTUNE II (INSTRUMENTS) ×2 IMPLANT
NDL TAPERED W/ NITINOL LOOP (MISCELLANEOUS) ×1 IMPLANT
NEEDLE TAPERED W/ NITINOL LOOP (MISCELLANEOUS) ×2 IMPLANT
NS IRRIG 1000ML POUR BTL (IV SOLUTION) ×2 IMPLANT
PACK SHOULDER (CUSTOM PROCEDURE TRAY) ×2 IMPLANT
PAD ARMBOARD 7.5X6 YLW CONV (MISCELLANEOUS) ×2 IMPLANT
PAD COLD SHLDR WRAP-ON (PAD) ×2 IMPLANT
PIN NITINOL TARGETER 2.8 (PIN) IMPLANT
PIN SET MODULAR GLENOID SYSTEM (PIN) IMPLANT
RESTRAINT HEAD UNIVERSAL NS (MISCELLANEOUS) ×2 IMPLANT
SCREW CENTRAL MODULAR 25 (Screw) ×1 IMPLANT
SCREW PERI LOCK 5.5X16 (Screw) ×2 IMPLANT
SCREW PERI LOCK 5.5X24 (Screw) ×1 IMPLANT
SCREW PERIPHERAL 5.5X28 LOCK (Screw) ×1 IMPLANT
SLING ARM FOAM STRAP LRG (SOFTGOODS) IMPLANT
SLING ARM FOAM STRAP MED (SOFTGOODS) ×1 IMPLANT
SPONGE T-LAP 18X18 ~~LOC~~+RFID (SPONGE) IMPLANT
STEM HUMERAL MOD SZ 5 135 DEG (Stem) ×1 IMPLANT
SUCTION FRAZIER HANDLE 12FR (TUBING) ×2
SUCTION TUBE FRAZIER 12FR DISP (TUBING) ×1 IMPLANT
SUT FIBERWIRE #2 38 T-5 BLUE (SUTURE)
SUT MNCRL AB 3-0 PS2 18 (SUTURE) ×2 IMPLANT
SUT MON AB 2-0 CT1 36 (SUTURE) ×2 IMPLANT
SUT VIC AB 1 CT1 36 (SUTURE) ×2 IMPLANT
SUTURE FIBERWR #2 38 T-5 BLUE (SUTURE) IMPLANT
SUTURE TAPE 1.3 40 TPR END (SUTURE) ×2 IMPLANT
SUTURETAPE 1.3 40 TPR END (SUTURE) ×4
TOWEL OR 17X26 10 PK STRL BLUE (TOWEL DISPOSABLE) ×2 IMPLANT
TOWEL OR NON WOVEN STRL DISP B (DISPOSABLE) ×2 IMPLANT
WATER STERILE IRR 1000ML POUR (IV SOLUTION) ×4 IMPLANT
YANKAUER SUCT BULB TIP 10FT TU (MISCELLANEOUS) ×2 IMPLANT

## 2020-12-26 NOTE — Transfer of Care (Signed)
Immediate Anesthesia Transfer of Care Note  Patient: Alexis Hines  Procedure(s) Performed: REVERSE SHOULDER ARTHROPLASTY (Left: Shoulder)  Patient Location: PACU  Anesthesia Type:GA combined with regional for post-op pain  Level of Consciousness: sedated and patient cooperative  Airway & Oxygen Therapy: Patient Spontanous Breathing and Patient connected to face mask oxygen  Post-op Assessment: Report given to RN, Post -op Vital signs reviewed and stable and Patient moving all extremities X 4  Post vital signs: stable  Last Vitals:  Vitals Value Taken Time  BP 140/78 12/26/20 1653  Temp    Pulse 69 12/26/20 1657  Resp 19 12/26/20 1657  SpO2 99 % 12/26/20 1657  Vitals shown include unvalidated device data.  Last Pain:  Vitals:   12/26/20 1403  TempSrc:   PainSc: 0-No pain         Complications: No notable events documented.

## 2020-12-26 NOTE — Anesthesia Procedure Notes (Signed)
Anesthesia Regional Block: Interscalene brachial plexus block   Pre-Anesthetic Checklist: , timeout performed,  Correct Patient, Correct Site, Correct Laterality,  Correct Procedure, Correct Position, site marked,  Risks and benefits discussed,  Surgical consent,  Pre-op evaluation,  At surgeon's request and post-op pain management  Laterality: Left  Prep: chloraprep       Needles:  Injection technique: Single-shot  Needle Type: Echogenic Stimulator Needle     Needle Length: 10cm  Needle Gauge: 21   Needle insertion depth: 5 cm   Additional Needles:   Procedures:,,,, ultrasound used (permanent image in chart),,   Motor weakness within 5 minutes.  Narrative:  Start time: 12/26/2020 1:54 PM End time: 12/26/2020 1:59 PM Injection made incrementally with aspirations every 5 mL.  Performed by: Personally  Anesthesiologist: Josephine Igo, MD  Additional Notes: Timeout performed. Patient sedated. Relevant anatomy ID'd using [place]. Incremental 2-71ml injection of LA with frequent aspiration. Patient tolerated procedure well.    Left Interscalene Block

## 2020-12-26 NOTE — Progress Notes (Signed)
AssistedDr. Royce Macadamia with left, ultrasound guided, interscalene  block. Side rails up, monitors on throughout procedure. See vital signs in flow sheet. Tolerated Procedure well.

## 2020-12-26 NOTE — Anesthesia Procedure Notes (Signed)
Procedure Name: Intubation Date/Time: 12/26/2020 3:23 PM Performed by: Lissa Morales, CRNA Pre-anesthesia Checklist: Patient identified, Emergency Drugs available, Suction available and Patient being monitored Patient Re-evaluated:Patient Re-evaluated prior to induction Oxygen Delivery Method: Circle system utilized Preoxygenation: Pre-oxygenation with 100% oxygen Induction Type: IV induction Ventilation: Mask ventilation without difficulty and Oral airway inserted - appropriate to patient size Laryngoscope Size: Mac and 4 Tube type: Oral Number of attempts: 1 Airway Equipment and Method: Stylet and Oral airway Placement Confirmation: ETT inserted through vocal cords under direct vision, positive ETCO2 and breath sounds checked- equal and bilateral Secured at: 21 cm Tube secured with: Tape Dental Injury: Teeth and Oropharynx as per pre-operative assessment

## 2020-12-26 NOTE — Plan of Care (Signed)
Plan of care reviewed and discussed with the patient. 

## 2020-12-26 NOTE — Anesthesia Postprocedure Evaluation (Signed)
Anesthesia Post Note  Patient: Alexis Hines  Procedure(s) Performed: REVERSE SHOULDER ARTHROPLASTY (Left: Shoulder)     Patient location during evaluation: PACU Anesthesia Type: General Level of consciousness: awake and alert and oriented Pain management: pain level controlled Vital Signs Assessment: post-procedure vital signs reviewed and stable Respiratory status: spontaneous breathing, nonlabored ventilation and respiratory function stable Cardiovascular status: blood pressure returned to baseline and stable Postop Assessment: no apparent nausea or vomiting Anesthetic complications: no   No notable events documented.  Last Vitals:  Vitals:   12/26/20 1715 12/26/20 1730  BP: (!) 156/81 (!) 165/78  Pulse: 66 61  Resp: 14 16  Temp:    SpO2: 96% 96%    Last Pain:  Vitals:   12/26/20 1730  TempSrc:   PainSc: Asleep                 [name] A.

## 2020-12-26 NOTE — Plan of Care (Signed)
  Problem: Pain Management: Goal: Pain level will decrease with appropriate interventions Outcome: Progressing   Problem: Education: Goal: Knowledge of General Education information will improve Description: Including pain rating scale, medication(s)/side effects and non-pharmacologic comfort measures Outcome: Progressing   Problem: Health Behavior/Discharge Planning: Goal: Ability to manage health-related needs will improve Outcome: Progressing   Problem: Clinical Measurements: Goal: Ability to maintain clinical measurements within normal limits will improve Outcome: Progressing   Problem: Activity: Goal: Risk for activity intolerance will decrease Outcome: Progressing   Problem: Nutrition: Goal: Adequate nutrition will be maintained Outcome: Progressing   Problem: Elimination: Goal: Will not experience complications related to bowel motility Outcome: Progressing

## 2020-12-26 NOTE — Anesthesia Preprocedure Evaluation (Addendum)
Anesthesia Evaluation  Patient identified by MRN, date of birth, ID band Patient awake    Reviewed: Allergy & Precautions, NPO status , Patient's Chart, lab work & pertinent test results, reviewed documented beta blocker date and time   Airway Mallampati: I  TM Distance: >3 FB Neck ROM: Full    Dental  (+) Dental Advisory Given, Caps   Pulmonary    Pulmonary exam normal breath sounds clear to auscultation       Cardiovascular Pt. on home beta blockers Normal cardiovascular exam+ dysrhythmias Supra Ventricular Tachycardia  Rhythm:Regular Rate:Normal  EKG 02/29/20 NSR with 1st degree AV Block with PAC's  Echo 08/31/19 1. Left ventricular ejection fraction, by estimation, is 70 to 75%. The left ventricle has hyperdynamic function. The left ventricle has no regional wall motion abnormalities. Left ventricular diastolic parameters are consistent with Grade I diastolic dysfunction (impaired relaxation).  2. Right ventricular systolic function is normal. The right ventricular size is mildly enlarged. There is normal pulmonary artery systolic pressure. The estimated right ventricular systolic pressure is 57.9 mmHg.  3. Right atrial size was mildly dilated.  4. Degenerative mitral valve disease. There is mild to moderate MAC. The  chordal apparatus is calcified as well. The mitral valve is degenerative. Mild mitral valve regurgitation. No evidence of mitral stenosis.  5. The aortic valve is tricuspid. Aortic valve regurgitation is not visualized. Mild aortic valve sclerosis is present, with no evidence of aortic valve stenosis.  6. The inferior vena cava is normal in size with greater than 50% respiratory variability, suggesting right atrial pressure of 3 mmHg.  Hx/o Raynaud's phenomenon   Neuro/Psych  Headaches, PSYCHIATRIC DISORDERS Anxiety    GI/Hepatic negative GI ROS, Neg liver ROS,   Endo/Other  negative endocrine ROS   Renal/GU negative Renal ROS  negative genitourinary   Musculoskeletal  (+) Arthritis , Osteoarthritis,  OA left shoulder   Abdominal   Peds  Hematology negative hematology ROS (+)   Anesthesia Other Findings   Reproductive/Obstetrics                            Anesthesia Physical Anesthesia Plan  ASA: 2  Anesthesia Plan: General   Post-op Pain Management:  Regional for Post-op pain   Induction: Intravenous  PONV Risk Score and Plan: 4 or greater and Treatment may vary due to age or medical condition, Ondansetron and Dexamethasone  Airway Management Planned: Oral ETT  Additional Equipment:   Intra-op Plan:   Post-operative Plan: Extubation in OR  Informed Consent: I have reviewed the patients History and Physical, chart, labs and discussed the procedure including the risks, benefits and alternatives for the proposed anesthesia with the patient or authorized representative who has indicated his/her understanding and acceptance.     Dental advisory given  Plan Discussed with: CRNA and Anesthesiologist  Anesthesia Plan Comments:         Anesthesia Quick Evaluation

## 2020-12-26 NOTE — Op Note (Signed)
12/26/2020  4:27 PM  PATIENT:   Alexis Hines  87 y.o. female  PRE-OPERATIVE DIAGNOSIS:  left shoulder osteoarthritis  POST-OPERATIVE DIAGNOSIS: Same  PROCEDURE: Left shoulder reverse arthroplasty utilizing a press-fit size 5.5 Arthrex stem with a neutral metaphysis, +3 polyethylene insert, 36/+4 glenosphere on a small/+2 baseplate  SURGEON:  Gisella Alwine, Metta Clines M.D.  ASSISTANTS: Jenetta Loges, PA-C  ANESTHESIA:   General endotracheal and interscalene block with Exparel  EBL: 100 cc  SPECIMEN: None  Drains: None   PATIENT DISPOSITION:  PACU - hemodynamically stable.    PLAN OF CARE: Admit for overnight observation  Brief history:  Patient is an 87 year old female with chronic bilateral shoulder pain related to severe osteoarthritis, left much more symptomatic than the right.  Due to her increasing functional imitations and failure to respond to prolonged attempts at conservative management, she is brought to the operating this time for planned left reverse shoulder arthroplasty.  Preoperatively, I counseled the patient regarding treatment options and risks versus benefits thereof.  Possible surgical complications were all reviewed including potential for bleeding, infection, neurovascular injury, persistent pain, loss of motion, anesthetic complication, failure of the implant, and possible need for additional surgery. They understand and accept and agrees with our planned procedure.   Procedure detail:  After undergoing routine preop evaluation the patient received prophylactic antibiotics and interscalene block with Exparel was Alexis Hines's in the holding area by the anesthesia department.  The patient was subsequently placed supine on the operating table and underwent the smooth induction of a general endotracheal anesthesia.  Placed into the beachchair position and appropriately padded and protected.  The left shoulder girdle region was sterilely prepped and draped in standard  fashion.  Timeout was called.  An approximately a centimeter anterior deltopectoral approach left shoulder is made sharp dissection carried down through skin subcu and electrocautery was used for hemostasis.  The deltopectoral interval was then developed from proximal to distal with the vein taken laterally.  Upper centimeter the pectoralis major was tenotomized for exposure the conjoined tendon was mobilized retracted medially.  The long head biceps tendon had a bifurcated proximal aspect.  It was tenodesed at the upper border the pectoralis major and the dominant limb extending proximally was then unroofed and excised and the residual limb inserted into the subscapularis which we excised.  Rotator cuff was split along the rotator interval to the base of the coracoid and the subscapularis was then separated from the lesser tuberosity using electrocautery and the margin was then tagged with a pair of suture tape sutures.  Capsular attachments on the anterior and infra margin of the humeral neck were then divided in a subperiosteal fashion allowing the liver the humeral head through the wound.  We outlined our proposed humeral head resection using the extra medullary guide and this was then performed with an oscillating saw at approximate 20 degrees retroversion.  Peripheral osteophytes were removed and a small cap was then placed over the cut proximal humeral surface.  We then exposed the glenoid with appropriate retractors and performed a circumferential labral resection gaining complete visualization of the periphery of the glenoid.  A guidepin was then directed into the center of the glenoid in neutral position and the glenoid was then reamed with the central and peripheral reamers to a stable subchondral bony bed.  All debris was then carefully irrigated free and removed.  Preparation of the glenoid completed with the central drill and tapped for a 25 mm lag screw.  Our baseplate  was then assembled and  vancomycin powder was applied to the threads of the lag screw and the baseplate was then inserted with excellent fit and fixation.  The peripheral locking screws were all then placed using standard technique with excellent fixation.  We then placed the 36/+4 glenosphere onto the baseplate and it was impacted and the central locking screw was placed.  We then returned our attention to the proximal humerus where the canal was opened and we broached up to a size 5.5 stem.  A neutral reaming guide was then utilized to prepare the metaphysis.  Trial implant was placed showing good motion good stability good soft tissue balance.  This point the trial was removed.  The final implant was assembled.  Vancomycin powder was spread liberally into the humeral canal and the final implant was then impacted into position with excellent fit and fixation.  This point a +3 polywas trialed showing excellent motion stability and soft tissue balance.  The trial polyp was removed the final polyp was then impacted after the implant was cleaned and dried.  Our final reduction showed good motion good stability good soft tissue balance all much to our satisfaction.  The joint was then copiously irrigated.  We confirmed good elasticity of the subscapularis and it was then repaired back to the eyelets on the collar of the implant.  The arm easily achieved 40 degrees of external rotation without excessive tension on the subscap repair.  Final irrigation completed.  Hemostasis was obtained.  The balance of vancomycin powder was then spread liberally throughout the deep soft tissue planes.  The deltopectoral interval was reapproximated with a series of figure-of-eight number Vicryl sutures.  2-0 Monocryl used to the subcu layer and intracuticular 3-0 Monocryl for the skin followed by Dermabond and Aquacel dressing.  Left arm was placed into a sling.  The patient was awakened, extubated, and taken to the recovery room in stable condition.  Jenetta Loges, PA-C was utilized as an Environmental consultant throughout this case, essential for help with positioning the patient, positioning extremity, tissue manipulation, implantation of the prosthesis, suture management, wound closure, and intraoperative decision-making.  Marin Shutter MD   Contact # [phone]

## 2020-12-26 NOTE — H&P (Signed)
Alexis Hines    Chief Complaint: left shoulder osteoarthritis HPI: The patient is a 87 y.o. female with chronic bilateral shoulder pain related to severe osteoarthritis, left much more symptomatic than the right.  Due to her increasing functional imitations and failure to respond to prolonged attempts at conservative management, she is brought to the operating room at this time for planned left shoulder reverse arthroplasty.  Past Medical History:  Diagnosis Date   Abnormal TSH 2004   normal since   Arthritis    Basal cell cancer    nose; Dr. Allyson Sabal    Diverticulosis 10/2005   Dizziness    Migraine childhood   resolved   PAC (premature atrial contraction)    frequent on cardiac monitor 09/14/19   Pure hypercholesterolemia    Radial styloid tenosynovitis (de quervain) 11/2018   injected by Dr. Fredna Dow 11/2018, 02/2019   SCCA (squamous cell carcinoma) of skin 12/2015   right temple   Squamous cell carcinoma of skin 02/15/2017   in situ-left jaw ([id number])    Past Surgical History:  Procedure Laterality Date   BREAST BIOPSY Left 1984   benign   EYE SURGERY     Bil and eyelids lifted   left hip intra-articular steriod injection  03/13/2014   Mohs procedure     TONSILLECTOMY  age 75   TOTAL HIP ARTHROPLASTY Right 10/10/2019   Procedure: TOTAL HIP ARTHROPLASTY ANTERIOR APPROACH;  Surgeon: Paralee Cancel, MD;  Location: [facility];  Service: Orthopedics;  Laterality: Right;  70 mins   TOTAL KNEE ARTHROPLASTY Left 07/2003   Dr. Theda Sers   TOTAL KNEE ARTHROPLASTY Right 09/03/2017   Procedure: RIGHT TOTAL KNEE ARTHROPLASTY;  Surgeon: Sydnee Cabal, MD;  Location: [facility];  Service: Orthopedics;  Laterality: Right;    Family History  Problem Relation Age of Onset   Cancer Sister        bone cancer L leg with mets to lung, diagnosed age 82; s/p amputation   Hyperlipidemia Brother    Heart disease Brother 33       CABG   Cancer Sister 65       breast cancer and lung cancer (lung dx'd  first)   Rheum arthritis Daughter    Gout Son     Social History:  reports that she has never smoked. She has never used smokeless tobacco. She reports current alcohol use. She reports that she does not use drugs.   Medications Prior to Admission  Medication Sig Dispense Refill   atorvastatin (LIPITOR) 40 MG tablet Take 1 tablet (40 mg total) by mouth at bedtime. 90 tablet 3   Biotin 10000 MCG TABS Take 10,000 mcg by mouth in the morning.     Calcium Carbonate-Vitamin D (CALTRATE 600+D PO) Take 1 tablet by mouth daily.     docusate sodium (COLACE) 100 MG capsule Take 1 capsule (100 mg total) by mouth 2 (two) times daily. 28 capsule 0   ipratropium (ATROVENT) 0.06 % nasal spray Place 2 sprays into each nostril 3-4 times daily for runny nose (Patient not taking: Reported on 12/12/2020) 15 mL 2   MELATONIN PO Take 1 tablet by mouth at bedtime as needed (sleep).     metoprolol succinate (TOPROL-XL) 50 MG 24 hr tablet Take 1 tablet (50 mg total) by mouth daily. Take with or immediately following a meal. 90 tablet 3   Multiple Vitamin (MULTIVITAMIN WITH MINERALS) TABS tablet Take 1 tablet by mouth in the morning. CENTRUM MULTIVITAMIN  mupirocin ointment (BACTROBAN) 2 % Apply 1 application topically 3 (three) times daily. (Patient not taking: Reported on 12/12/2020) 30 g 1   naproxen sodium (ALEVE) 220 MG tablet Take 440 mg by mouth daily as needed (shoulder pain).     Omega-3 Fatty Acids (FISH OIL) 1000 MG CAPS Take 1 capsule by mouth daily.     Probiotic Product (PROBIOTIC DAILY) CAPS Take 1 capsule by mouth daily with lunch.     Turmeric 400 MG CAPS Take 1 capsule by mouth daily.     Wheat Dextrin (BENEFIBER PO) Take by mouth.       Physical Exam: Left shoulder demonstrates markedly restricted and painful motion with global weakness as noted at her recent office visits.  She otherwise remains neurovascular intact in the left upper extremity.  Radiographs  Plain films of the left shoulder  confirm severe osteoarthritis with complete obliteration of joint space, subchondral sclerosis, and peripheral osteophyte formation.  Vitals  Temp:  [98.1 F (36.7 C)] 98.1 F (36.7 C) (09/29 1229) Pulse Rate:  [63-74] 69 (09/29 1403) Resp:  [16-19] 19 (09/29 1403) BP: (136-177)/(54-86) 177/82 (09/29 1403) SpO2:  [98 %-99 %] 99 % (09/29 1403) Weight:  [56 kg] 56 kg (09/29 1235)  Assessment/Plan  Impression: left shoulder osteoarthritis  Plan of Action: Procedure(s): REVERSE SHOULDER ARTHROPLASTY  Nicci Vaughan M Dontell Mian 12/26/2020, 2:37 PM Contact # [phone]

## 2020-12-27 DIAGNOSIS — M19012 Primary osteoarthritis, left shoulder: Secondary | ICD-10-CM | POA: Diagnosis not present

## 2020-12-27 MED ORDER — ONDANSETRON HCL 4 MG PO TABS: 4.0000 mg | ORAL_TABLET | Freq: Three times a day (TID) | ORAL | 0 refills | Status: DC | PRN

## 2020-12-27 MED ORDER — OXYCODONE-ACETAMINOPHEN 5-325 MG PO TABS: 1.0000 | ORAL_TABLET | ORAL | 0 refills | Status: DC | PRN

## 2020-12-27 MED ORDER — METOPROLOL SUCCINATE ER 50 MG PO TB24: 50.0000 mg | ORAL_TABLET | Freq: Every day | ORAL | Status: DC

## 2020-12-27 MED ORDER — TRAMADOL HCL 50 MG PO TABS: 50.0000 mg | ORAL_TABLET | Freq: Four times a day (QID) | ORAL | 0 refills | Status: DC | PRN

## 2020-12-27 MED ORDER — NAPROXEN 500 MG PO TABS: 500.0000 mg | ORAL_TABLET | Freq: Two times a day (BID) | ORAL | 1 refills | Status: DC

## 2020-12-27 NOTE — Evaluation (Signed)
Occupational Therapy Evaluation Patient Details Name: Alexis Hines MRN: [id number] DOB: [birth year] Today's Date: 12/27/2020   History of Present Illness Patient is an 87 year old female s/p L reverse total shoulder arthroplasty. PMH includes bilateral knee replacements and hip replacement   Clinical Impression   Patient is an 87 year old female s/p shoulder replacement without functional use of left non-dominant upper extremity secondary to effects of surgery and interscalene block and shoulder precautions. Therapist provided education and instruction to patient in regards to exercises, precautions, positioning, donning upper extremity clothing and bathing while maintaining shoulder precautions, ice and edema management and donning/doffing sling. Patient verbalized understanding and demonstrated as needed. Patient needed assistance to donn shirt, underwear, and provided with instruction on compensatory strategies to perform ADLs. Patient to follow up with MD for further therapy needs.        Recommendations for follow up therapy are one component of a multi-disciplinary discharge planning process, led by the attending physician.  Recommendations may be updated based on patient status, additional functional criteria and insurance authorization.   Follow Up Recommendations  Follow surgeon's recommendation for DC plan and follow-up therapies    Equipment Recommendations  None recommended by OT       Precautions / Restrictions Precautions Precautions: Shoulder Type of Shoulder Precautions: If sitting in controlled environment, ok to come out of sling to give neck a break. Please sleep in it to protect until follow up in office.  OK to use operative arm for feeding, hygiene and ADLs. Ok to instruct Pendulums and lap slides as exercises. Ok to use operative arm within the following parameters for ADL purposes   Ok for PROM, AAROM, AROM within pain tolerance and within the following ROM   ER 20    ABD 45   FE 60. AROM elbow, wrist, hand ok Shoulder Interventions: Shoulder sling/immobilizer;Off for dressing/bathing/exercises Precaution Booklet Issued: Yes (comment) Required Braces or Orthoses: Sling Restrictions Weight Bearing Restrictions: Yes LUE Weight Bearing: Non weight bearing      Mobility Bed Mobility               General bed mobility comments: in chair    Transfers Overall transfer level: Independent Equipment used: None                  Balance Overall balance assessment: No apparent balance deficits (not formally assessed)                                         ADL either performed or assessed with clinical judgement   ADL Overall ADL's : Needs assistance/impaired Eating/Feeding: Independent   Grooming: Independent   Upper Body Bathing: Minimal assistance   Lower Body Bathing: Independent   Upper Body Dressing : Minimal assistance;Standing Upper Body Dressing Details (indicate cue type and reason): to initiate threading L UE due to numbness from nerve block, patient able to pull T shirt overhead and thread R UE Lower Body Dressing: Minimal assistance;Sit to/from stand Lower Body Dressing Details (indicate cue type and reason): to pull underwear up over L hip Toilet Transfer: Independent;Ambulation   Toileting- Clothing Manipulation and Hygiene: Minimal assistance;Sit to/from stand Toileting - Clothing Manipulation Details (indicate cue type and reason): discuss with patient to wear house coat or night gown at home without LB clothing if concerned over managing underwear when toileting     Functional mobility during  ADLs: Independent General ADL Comments: patient educated in compensatory strategies for self care tasks in order to maintain shoulder precautions      Pertinent Vitals/Pain Pain Assessment: Faces Faces Pain Scale: Hurts a little bit Pain Location: L UE Pain Descriptors / Indicators:  Heaviness;Numbness Pain Intervention(s): Monitored during session     Hand Dominance Right   Extremity/Trunk Assessment Upper Extremity Assessment Upper Extremity Assessment: LUE deficits/detail LUE Deficits / Details: + nerve block   Lower Extremity Assessment Lower Extremity Assessment: Overall WFL for tasks assessed       Communication Communication Communication: HOH   Cognition Arousal/Alertness: Awake/alert Behavior During Therapy: WFL for tasks assessed/performed Overall Cognitive Status: Within Functional Limits for tasks assessed                                        Exercises Exercises: Shoulder   Shoulder Instructions Shoulder Instructions Donning/doffing shirt without moving shoulder: Minimal assistance;Patient able to independently direct caregiver Method for sponge bathing under operated UE: Minimal assistance;Patient able to independently direct caregiver Donning/doffing sling/immobilizer: Moderate assistance;Patient able to independently direct caregiver Correct positioning of sling/immobilizer: Minimal assistance;Patient able to independently direct caregiver Pendulum exercises (written home exercise program): Patient able to independently direct caregiver ROM for elbow, wrist and digits of operated UE: Patient able to independently direct caregiver Sling wearing schedule (on at all times/off for ADL's): Patient able to independently direct caregiver Proper positioning of operated UE when showering: Patient able to independently direct caregiver Dressing change:  (N/A) Positioning of UE while sleeping: Patient able to independently direct caregiver    Home Living Family/patient expects to be discharged to:: Private residence Living Arrangements: Alone Available Help at Discharge: Family;Available 24 hours/day (DTR staying with initially) Type of Home: Other(Comment) (townhouse) Home Access: Stairs to enter Entrance Stairs-Number of Steps:  1 vstep onto walkway, then threshold into house   Home Layout: One level     Bathroom Shower/Tub: Teacher, early years/pre: Handicapped height     Home Equipment: Tub bench;Cane - single point;Walker - 2 wheels          Prior Functioning/Environment Level of Independence: Independent                 OT Problem List: Pain;Impaired UE functional use;Decreased knowledge of precautions         OT Goals(Current goals can be found in the care plan section) Acute Rehab OT Goals Patient Stated Goal: get back to exercising OT Goal Formulation: All assessment and education complete, DC therapy   AM-PAC OT "6 Clicks" Daily Activity     Outcome Measure Help from another person eating meals?: None Help from another person taking care of personal grooming?: None Help from another person toileting, which includes using toliet, bedpan, or urinal?: A Little Help from another person bathing (including washing, rinsing, drying)?: A Little Help from another person to put on and taking off regular upper body clothing?: A Little Help from another person to put on and taking off regular lower body clothing?: A Little 6 Click Score: 20   End of Session Equipment Utilized During Treatment: Other (comment) (sling) Nurse Communication: Other (comment) (OT complete)  Activity Tolerance: Patient tolerated treatment well Patient left: in chair;with call bell/phone within reach  OT Visit Diagnosis: Pain Pain - Right/Left: Left Pain - part of body: Shoulder  Time: 3354-5625 OT Time Calculation (min): 44 min Charges:  OT General Charges $OT Visit: 1 Visit OT Evaluation $OT Eval Low Complexity: 1 Low OT Treatments $Self Care/Home Management : 23-37 mins  Delbert Phenix OT OT pager: [phone]  Rosemary Holms 12/27/2020, 2:34 PM

## 2020-12-27 NOTE — Plan of Care (Signed)
  Problem: Education: Goal: Knowledge of the prescribed therapeutic regimen will improve Outcome: Progressing   Problem: Activity: Goal: Ability to tolerate increased activity will improve Outcome: Progressing   Problem: Pain Management: Goal: Pain level will decrease with appropriate interventions 12/27/2020 0748 by Williams Che, RN Outcome: Progressing 12/26/2020 1815 by Williams Che, RN Outcome: Progressing   Problem: Education: Goal: Knowledge of General Education information will improve Description: Including pain rating scale, medication(s)/side effects and non-pharmacologic comfort measures 12/27/2020 0748 by Williams Che, RN Outcome: Progressing 12/26/2020 1815 by Williams Che, RN Outcome: Progressing   Problem: Health Behavior/Discharge Planning: Goal: Ability to manage health-related needs will improve Outcome: Progressing   Problem: Clinical Measurements: Goal: Ability to maintain clinical measurements within normal limits will improve 12/27/2020 0748 by Williams Che, RN Outcome: Progressing 12/26/2020 1815 by Williams Che, RN Outcome: Progressing   Problem: Activity: Goal: Risk for activity intolerance will decrease 12/27/2020 0748 by Williams Che, RN Outcome: Progressing 12/26/2020 1815 by Williams Che, RN Outcome: Progressing   Problem: Nutrition: Goal: Adequate nutrition will be maintained Outcome: Progressing   Problem: Coping: Goal: Level of anxiety will decrease Outcome: Progressing   Problem: Elimination: Goal: Will not experience complications related to bowel motility Outcome: Progressing   Problem: Pain Managment: Goal: General experience of comfort will improve Outcome: Progressing   Problem: Safety: Goal: Ability to remain free from injury will improve Outcome: Progressing

## 2020-12-27 NOTE — Discharge Instructions (Signed)
 Kevin M. Supple, M.D., F.A.A.O.S. Orthopaedic Surgery Specializing in Arthroscopic and Reconstructive Surgery of the Shoulder [phone] [address] - [place], [place] [address] - Fax [phone]   POST-OP TOTAL SHOULDER REPLACEMENT INSTRUCTIONS  1. Follow up in the office for your first post-op appointment 10-14 days from the date of your surgery. If you do not already have a scheduled appointment, our office will contact you to schedule.  2. The bandage over your incision is waterproof. You may begin showering with this dressing on. You may leave this dressing on until first follow up appointment within 2 weeks. We prefer you leave this dressing in place until follow up however after 5-7 days if you are having itching or skin irritation and would like to remove it you may do so. Go slow and tug at the borders gently to break the bond the dressing has with the skin. At this point if there is no drainage it is okay to go without a bandage or you may cover it with a light guaze and tape. You can also expect significant bruising around your shoulder that will drift down your arm and into your chest wall. This is very normal and should resolve over several days.   3. Wear your sling/immobilizer at all times except to perform the exercises below or to occasionally let your arm dangle by your side to stretch your elbow. You also need to sleep in your sling immobilizer until instructed otherwise. It is ok to remove your sling if you are sitting in a controlled environment and allow your arm to rest in a position of comfort by your side or on your lap with pillows to give your neck and skin a break from the sling. You may remove it to allow arm to dangle by side to shower. If you are up walking around and when you go to sleep at night you need to wear it.  4. Range of motion to your elbow, wrist, and hand are encouraged 3-5 times daily. Exercise to your hand and fingers helps to reduce  swelling you may experience.   5. Prescriptions for a pain medication and a muscle relaxant are provided for you. It is recommended that if you are experiencing pain that you pain medication alone is not controlling, add the muscle relaxant along with the pain medication which can give additional pain relief. The first 1-2 days is generally the most severe of your pain and then should gradually decrease. As your pain lessens it is recommended that you decrease your use of the pain medications to an "as needed basis'" only and to always comply with the recommended dosages of the pain medications.  6. Pain medications can produce constipation along with their use. If you experience this, the use of an over the counter stool softener or laxative daily is recommended.   7. For additional questions or concerns, please do not hesitate to call the office. If after hours there is an answering service to forward your concerns to the physician on call.  8.Pain control following an exparel block  To help control your post-operative pain you received a nerve block  performed with Exparel which is a long acting anesthetic (numbing agent) which can provide pain relief and sensations of numbness (and relief of pain) in the operative shoulder and arm for up to 3 days. Sometimes it provides mixed relief, meaning you may still have numbness in certain areas of the arm but can still be able to   move  parts of that arm, hand, and fingers. We recommend that your prescribed pain medications  be used as needed. We do not feel it is necessary to "pre medicate" and "stay ahead" of pain.  Taking narcotic pain medications when you are not having any pain can lead to unnecessary and potentially dangerous side effects.    9. Use the ice machine as much as possible in the first 5-7 days from surgery, then you can wean its use to as needed. The ice typically needs to be replaced every 6 hours, instead of ice you can actually freeze  water bottles to put in the cooler and then fill water around them to avoid having to purchase ice. You can have spare water bottles freezing to allow you to rotate them once they have melted. Try to have a thin shirt or light cloth or towel under the ice wrap to protect your skin.   FOR ADDITIONAL INFO ON ICE MACHINE AND INSTRUCTIONS GO TO THE WEBSITE AT  https://www.[employer].com/products/donjoy/donjoy-iceman-[email or web address]  10.  We recommend that you avoid any dental work or cleaning in the first 3 months following your joint replacement. This is to help minimize the possibility of infection from the bacteria in your mouth that enters your bloodstream during dental work. We also recommend that you take an antibiotic prior to your dental work for the first year after your shoulder replacement to further help reduce that risk. Please simply contact our office for antibiotics to be sent to your pharmacy prior to dental work.  11. Dental Antibiotics:  In most cases prophylactic antibiotics for Dental procdeures after total joint surgery are not necessary.  Exceptions are as follows:  1. History of prior total joint infection  2. Severely immunocompromised (Organ Transplant, cancer chemotherapy, Rheumatoid biologic meds such as Humera)  3. Poorly controlled diabetes (A1C &gt; 8.0, blood glucose over 200)  If you have one of these conditions, contact your surgeon for an antibiotic prescription, prior to your dental procedure.   POST-OP EXERCISES  Pendulum Exercises  Perform pendulum exercises while standing and bending at the waist. Support your uninvolved arm on a table or chair and allow your operated arm to hang freely. Make sure to do these exercises passively - not using you shoulder muscles. These exercises can be performed once your nerve block effects have worn off.  Repeat 20 times. Do 3 sessions per day.     

## 2020-12-27 NOTE — Discharge Summary (Signed)
PATIENT ID:      Alexis Hines  MRN:     [id number] DOB/AGE:    December 13, 1933 / 87 y.o.     DISCHARGE SUMMARY  ADMISSION DATE:    12/26/2020 DISCHARGE DATE:  12/27/2020  ADMISSION DIAGNOSIS: left shoulder osteoarthritis Past Medical History:  Diagnosis Date   Abnormal TSH 2004   normal since   Arthritis    Basal cell cancer    nose; Dr. Allyson Sabal    Diverticulosis 10/2005   Dizziness    Migraine childhood   resolved   PAC (premature atrial contraction)    frequent on cardiac monitor 09/14/19   Pure hypercholesterolemia    Radial styloid tenosynovitis (de quervain) 11/2018   injected by Dr. Fredna Dow 11/2018, 02/2019   SCCA (squamous cell carcinoma) of skin 12/2015   right temple   Squamous cell carcinoma of skin 02/15/2017   in situ-left jaw ([id number])    DISCHARGE DIAGNOSIS:   Active Problems:   S/P reverse total shoulder arthroplasty, left   PROCEDURE: Procedure(s): REVERSE SHOULDER ARTHROPLASTY on 12/26/2020  CONSULTS:    HISTORY:  See H&P in chart.  HOSPITAL COURSE:  Alexis Hines is a 87 y.o. admitted on 12/26/2020 with a diagnosis of left shoulder osteoarthritis.  They were brought to the operating room on 12/26/2020 and underwent Procedure(s): Sequoyah.    They were given perioperative antibiotics:  Anti-infectives (From admission, onward)    Start     Dose/Rate Route Frequency Ordered Stop   12/26/20 1552  vancomycin (VANCOCIN) powder  Status:  Discontinued          As needed 12/26/20 1552 12/26/20 1742   12/26/20 1230  ceFAZolin (ANCEF) IVPB 2g/100 mL premix        2 g 200 mL/hr over 30 Minutes Intravenous On call to O.R. 12/26/20 1213 12/26/20 1525   12/26/20 1213  vancomycin (VANCOCIN) IVPB 1000 mg/200 mL premix        1,000 mg 200 mL/hr over 60 Minutes Intravenous 60 min pre-op 12/26/20 1213 12/26/20 1514     .  Patient underwent the above named procedure and tolerated it well. The following day they were hemodynamically stable and pain  was controlled on oral analgesics. They were neurovascularly intact to the operative extremity. OT was ordered and worked with patient per protocol. They were medically and orthopaedically stable for discharge on .    DIAGNOSTIC STUDIES:  RECENT RADIOGRAPHIC STUDIES :  No results found.  RECENT VITAL SIGNS:  Patient Vitals for the past 24 hrs:  BP Temp Temp src Pulse Resp SpO2 Height Weight  12/27/20 0539 131/71 97.8 F (36.6 C) Oral 65 16 94 % -- --  12/27/20 0150 (!) 117/59 98.5 F (36.9 C) Oral 84 16 93 % -- --  12/26/20 2139 123/63 97.9 F (36.6 C) Oral 82 16 95 % -- --  12/26/20 1826 105/73 97.9 F (36.6 C) Oral 64 16 -- 4\' 11"  (1.499 m) 56 kg  12/26/20 1754 105/73 97.9 F (36.6 C) Oral 64 -- 100 % -- --  12/26/20 1730 (!) 165/78 97.7 F (36.5 C) -- 61 16 96 % -- --  12/26/20 1715 (!) 156/81 -- -- 66 14 96 % -- --  12/26/20 1702 -- -- -- -- -- 98 % -- --  12/26/20 1700 (!) 147/82 -- -- 67 20 98 % -- --  12/26/20 1653 140/78 (!) 97.4 F (36.3 C) -- 68 14 98 % -- --  12/26/20 1403 (!) 177/82 -- --  69 19 99 % -- --  12/26/20 1352 (!) 170/86 -- -- 63 16 98 % -- --  12/26/20 1235 -- -- -- -- -- -- -- 56 kg  12/26/20 1229 (!) 136/54 98.1 F (36.7 C) Oral 74 18 99 % -- --  .  RECENT EKG RESULTS:    Orders placed or performed in visit on 02/29/20   EKG 12-Lead    DISCHARGE INSTRUCTIONS:    DISCHARGE MEDICATIONS:   Allergies as of 12/27/2020   No Known Allergies      Medication List     STOP taking these medications    naproxen sodium 220 MG tablet Commonly known as: ALEVE       TAKE these medications    atorvastatin 40 MG tablet Commonly known as: LIPITOR Take 1 tablet (40 mg total) by mouth at bedtime.   BENEFIBER PO Take by mouth.   Biotin 10000 MCG Tabs Take 10,000 mcg by mouth in the morning.   CALTRATE 600+D PO Take 1 tablet by mouth daily.   docusate sodium 100 MG capsule Commonly known as: Colace Take 1 capsule (100 mg total) by mouth 2  (two) times daily.   Fish Oil 1000 MG Caps Take 1 capsule by mouth daily.   ipratropium 0.06 % nasal spray Commonly known as: ATROVENT Place 2 sprays into each nostril 3-4 times daily for runny nose   MELATONIN PO Take 1 tablet by mouth at bedtime as needed (sleep).   metoprolol succinate 50 MG 24 hr tablet Commonly known as: TOPROL-XL Take 1 tablet (50 mg total) by mouth daily. Take with or immediately following a meal.   multivitamin with minerals Tabs tablet Take 1 tablet by mouth in the morning. CENTRUM MULTIVITAMIN   mupirocin ointment 2 % Commonly known as: BACTROBAN Apply 1 application topically 3 (three) times daily.   naproxen 500 MG tablet Commonly known as: Naprosyn Take 1 tablet (500 mg total) by mouth 2 (two) times daily with a meal.   ondansetron 4 MG tablet Commonly known as: Zofran Take 1 tablet (4 mg total) by mouth every 8 (eight) hours as needed for nausea or vomiting.   oxyCODONE-acetaminophen 5-325 MG tablet Commonly known as: Percocet Take 1 tablet by mouth every 4 (four) hours as needed (for severe pain not controlled by tramadol).   Probiotic Daily Caps Take 1 capsule by mouth daily with lunch.   traMADol 50 MG tablet Commonly known as: ULTRAM Take 1 tablet (50 mg total) by mouth every 6 (six) hours as needed for moderate pain (for mild to mod pain).   Turmeric 400 MG Caps Take 1 capsule by mouth daily.        FOLLOW UP VISIT:    DISCHARGE TO: Home   DISCHARGE CONDITION:  Festus Barren for Dr. Justice Britain 12/27/2020, 9:47 AM

## 2020-12-27 NOTE — Progress Notes (Signed)
Provided discharge education/instructions, all questions and concerns addressed, Pt not in acute distress, discharged home with belongings accompanied by daughter.

## 2020-12-28 ENCOUNTER — Encounter (HOSPITAL_COMMUNITY): Payer: Self-pay | Admitting: Orthopedic Surgery

## 2020-12-30 ENCOUNTER — Telehealth: Payer: Self-pay

## 2020-12-30 NOTE — Telephone Encounter (Signed)
Transition Care Management Follow-up Telephone Call Date of discharge and from where: Alexis Hines 12/27/20 How have you been since you were released from the hospital? No Any questions or concerns? No  Items Reviewed: Did the pt receive and understand the discharge instructions provided? Yes  Medications obtained and verified? Yes  Other? No  Any new allergies since your discharge? No  Dietary orders reviewed? No Do you have support at home? Yes   Home Care and Equipment/Supplies: Were home health services ordered? no   Functional Questionnaire: (I = Independent and D = Dependent) ADLs: I  Bathing/Dressing- I  Meal Prep- I  Eating- I  Maintaining continence- I  Transferring/Ambulation- I  Managing Meds- I  Follow up appointments reviewed:  PCP Hospital f/u appt confirmed? No  Not needed Specialist Hospital f/u appt confirmed? Yes  Scheduled to see Alexis Hines on 01/06/21 @ 2pm. Are transportation arrangements needed? No  If their condition worsens, is the pt aware to call PCP or go to the Emergency Dept.? Yes Was the patient provided with contact information for the PCP's office or ED? Yes Was to pt encouraged to call back with questions or concerns? Yes

## 2021-01-06 ENCOUNTER — Other Ambulatory Visit: Payer: Self-pay | Admitting: Cardiology

## 2021-02-26 NOTE — Progress Notes (Signed)
Cardiology Office Note:    Date:  02/27/2021   ID:  DEBBRAH SAMPEDRO, DOB 1933/10/04, MRN [id number]  PCP:  Rita Ohara, MD  Cardiologist:  Donato Heinz, MD  Electrophysiologist:  None   Referring MD: Rita Ohara, MD   Chief Complaint  Patient presents with   Palpitations     History of Present Illness:    Alexis Hines is a 87 y.o. female with a hx of basal cell cancer, squamous cell carcinoma of skin, hyperlipidemia who presents for follow-up appointment. She was referred by Dr. Tomi Bamberger for evaluation of EKG abnormalities and preop evaluation, initially seen on 08/24/2019.  Plan for hip surgery on 08/29/2019.  Preoperative EKG done at Dr. Johnsie Kindred office 5/24 was concerning for AV dissociation and accelerated junctional rhythm.  On my review, appears to be sinus rhythm with frequent PACs.  Underwent hip surgery 2/37/62, no complications.  At initial clinic visit, she reported having palpitations and episodes of lightheadedness that lasted less than 1 minute.  She underwent a nuclear stress test in [place] on 07/05/2017, which showed normal EF, no perfusion defects.  Echocardiogram on 08/31/2019 showed LVEF 83-15%, grade 1 diastolic function, normal RV function, mild RV enlargement, degenerative mitral valve with mild MR. Zio patch x3 days on 09/14/2019 showed frequent PACs (11% of beats), with frequent episodes of SVT lasting up to 20 seconds.  Started on metoprolol.  Since last clinic visit, she reports that she has been doing well.  Denies any recent palpitations.  Denies any chest pain, dyspnea, headedness, syncope, lower extremity edema.  She has been doing physical therapy after recent shoulder surgery.  Has been going to the gym for dance exercise class once per week.  Past Medical History:  Diagnosis Date   Abnormal TSH 2004   normal since   Arthritis    Basal cell cancer    nose; Dr. Allyson Sabal    Diverticulosis 10/2005   Dizziness    Migraine childhood   resolved   PAC  (premature atrial contraction)    frequent on cardiac monitor 09/14/19   Pure hypercholesterolemia    Radial styloid tenosynovitis (de quervain) 11/2018   injected by Dr. Fredna Dow 11/2018, 02/2019   SCCA (squamous cell carcinoma) of skin 12/2015   right temple   Squamous cell carcinoma of skin 02/15/2017   in situ-left jaw (CX35FU)    Past Surgical History:  Procedure Laterality Date   BREAST BIOPSY Left 1984   benign   EYE SURGERY     Bil and eyelids lifted   left hip intra-articular steriod injection  03/13/2014   Mohs procedure     REVERSE SHOULDER ARTHROPLASTY Left 12/26/2020   Procedure: REVERSE SHOULDER ARTHROPLASTY;  Surgeon: Justice Britain, MD;  Location: [facility];  Service: Orthopedics;  Laterality: Left;  130min   TONSILLECTOMY  age 80   TOTAL HIP ARTHROPLASTY Right 10/10/2019   Procedure: TOTAL HIP ARTHROPLASTY ANTERIOR APPROACH;  Surgeon: Paralee Cancel, MD;  Location: [facility];  Service: Orthopedics;  Laterality: Right;  70 mins   TOTAL KNEE ARTHROPLASTY Left 07/2003   Dr. Theda Sers   TOTAL KNEE ARTHROPLASTY Right 09/03/2017   Procedure: RIGHT TOTAL KNEE ARTHROPLASTY;  Surgeon: Sydnee Cabal, MD;  Location: [facility];  Service: Orthopedics;  Laterality: Right;    Current Medications: Current Meds  Medication Sig   atorvastatin (LIPITOR) 40 MG tablet Take 1 tablet (40 mg total) by mouth at bedtime.   Biotin 10000 MCG TABS Take 10,000 mcg by mouth in the morning.  Calcium Carbonate-Vitamin D (CALTRATE 600+D PO) Take 1 tablet by mouth daily.   docusate sodium (COLACE) 100 MG capsule Take 1 capsule (100 mg total) by mouth 2 (two) times daily.   ipratropium (ATROVENT) 0.06 % nasal spray Place 2 sprays into each nostril 3-4 times daily for runny nose   MELATONIN PO Take 1 tablet by mouth at bedtime as needed (sleep).   Multiple Vitamin (MULTIVITAMIN WITH MINERALS) TABS tablet Take 1 tablet by mouth in the morning. CENTRUM MULTIVITAMIN   naproxen (NAPROSYN) 500 MG tablet Take 1 tablet  (500 mg total) by mouth 2 (two) times daily with a meal.   Omega-3 Fatty Acids (FISH OIL) 1000 MG CAPS Take 1 capsule by mouth daily.   Probiotic Product (PROBIOTIC DAILY) CAPS Take 1 capsule by mouth daily with lunch.   Turmeric 400 MG CAPS Take 1 capsule by mouth daily.   Wheat Dextrin (BENEFIBER PO) Take by mouth.   [DISCONTINUED] metoprolol succinate (TOPROL-XL) 50 MG 24 hr tablet Take 1 tablet (50 mg total) by mouth daily. Patient needs to schedule appointment for 1 year supply.     Allergies:   Patient has no known allergies.   Social History   Socioeconomic History   Marital status: Widowed    Spouse name: Not on file   Number of children: 2   Years of education: Not on file   Highest education level: Not on file  Occupational History   Occupation: Retired Psychologist, prison and probation services  Tobacco Use   Smoking status: Never   Smokeless tobacco: Never  Vaping Use   Vaping Use: Never used  Substance and Sexual Activity   Alcohol use: Yes    Comment: occasional glass of wine.   Drug use: No   Sexual activity: Not Currently  Other Topics Concern   Not on file  Social History Narrative   Widowed. Lives 1/2 the year in [place]. Her husband (retired Tax adviser) died from liver cancer in 05-10-11.  Daughter lives in [place]; 2 grandchildren here, and 2 in [place] Determinants of Health   Financial Resource Strain: Not on file  Food Insecurity: Not on file  Transportation Needs: Not on file  Physical Activity: Not on file  Stress: Not on file  Social Connections: Not on file     Family History: The patient's family history includes Cancer in her sister; Cancer (age of onset: 69) in her sister; Gout in her son; Heart disease (age of onset: 58) in her brother; Hyperlipidemia in her brother; Rheum arthritis in her daughter.  ROS:   Please see the history of present illness.     All other systems reviewed and are negative.  EKGs/Labs/Other Studies Reviewed:    The following  studies were reviewed today:   EKG:   02/27/21: Sinus rhythm, PACs, rate 62  Recent Labs: 12/10/2020: BUN 18; Creatinine, Ser 0.73; Hemoglobin 15.0; Platelets 285; Potassium 4.3; Sodium 143  Recent Lipid Panel    Component Value Date/Time   CHOL 181 11/09/2019 0947   TRIG 117 11/09/2019 0947   HDL 62 11/09/2019 0947   CHOLHDL 2.9 11/09/2019 0947   CHOLHDL 1.9 12/23/2016 0816   VLDL 26 12/26/2015 0706   LDLCALC 98 11/09/2019 0947   LDLCALC 65 12/23/2016 0816    Physical Exam:    VS:  BP 130/72   Pulse 62   Ht 4\' 11"  (1.499 m)   Wt 123 lb 9.6 oz (56.1 kg)   SpO2 98%   BMI 24.96 kg/m  Wt Readings from Last 3 Encounters:  02/27/21 123 lb 9.6 oz (56.1 kg)  12/26/20 123 lb 6.4 oz (56 kg)  12/12/20 123 lb 6.4 oz (56 kg)     GEN:  in no acute distress HEENT: Normal NECK: No JVD; No carotid bruits LYMPHATICS: No lymphadenopathy CARDIAC: irregular, tachycardic, no murmurs RESPIRATORY:  Clear to auscultation without rales, wheezing or rhonchi  ABDOMEN: Soft, non-tender, non-distended MUSCULOSKELETAL:  No edema; No deformity  SKIN: Warm and dry NEUROLOGIC:  Alert and oriented x 3 PSYCHIATRIC:  Normal affect   ASSESSMENT:    1. SVT (supraventricular tachycardia) (HCC)   2. Palpitations   3. Lightheadedness     PLAN:     Lightheadedness/near syncope/palpitations: Frequent PACs/runs of SVT on EKG. Zio patch x3 days on 09/14/2019 showed frequent PACs (11% of beats), with frequent episodes of SVT lasting up to 20 seconds.  Started on metoprolol 25 mg twice daily.  Echocardiogram on 08/31/2019 showed LVEF 73-71%, grade 1 diastolic function, normal RV function, mild RV enlargement, degenerative mitral valve with mild MR.  -Reports palpitations have resolved and no further lightheadedness.  Continue metoprolol.  She is requesting to reduce her dose, will drop to 25 mg daily, but advised if worsening palpitations would increase back to 50 mg daily.  Will check Zio patch x3 days  evaluate for improvement in PACs and SVT burden on metoprolol  RTC in 1 year   Medication Adjustments/Labs and Tests Ordered: Current medicines are reviewed at length with the patient today.  Concerns regarding medicines are outlined above.  Orders Placed This Encounter  Procedures   LONG TERM MONITOR (3-14 DAYS)   EKG 12-Lead    Meds ordered this encounter  Medications   DISCONTD: metoprolol succinate (TOPROL-XL) 25 MG 24 hr tablet    Sig: Take 1 tablet (25 mg total) by mouth daily.    Dispense:  90 tablet    Refill:  3    Dose decrease   metoprolol succinate (TOPROL-XL) 25 MG 24 hr tablet    Sig: Take 1 tablet (25 mg total) by mouth daily.    Dispense:  90 tablet    Refill:  3    Dose decrease     Patient Instructions  Medication Instructions:  DECREASE metoprolol succinate (Toprol XL) to 25 mg once daily  *If you need a refill on your cardiac medications before your next appointment, please call your pharmacy*  Testing/Procedures: [place] [place]- Long Term Monitor Instructions   Your physician has requested you wear a ZIO patch monitor for _3_ days.  This is a single patch monitor.   [employer] supplies one patch monitor per enrollment. Additional stickers are not available. Please do not apply patch if you will be having a Nuclear Stress Test, Echocardiogram, Cardiac CT, MRI, or Chest Xray during the period you would be wearing the monitor. The patch cannot be worn during these tests. You cannot remove and re-apply the ZIO XT patch monitor.  Your ZIO patch monitor will be sent Fed Ex from [employer] directly to your home address. It may take 3-5 days to receive your monitor after you have been enrolled.  Once you have received your monitor, please review the enclosed instructions. Your monitor has already been registered assigning a specific monitor serial # to you.  Billing and Patient Assistance Program Information   We have supplied [employer] with any of your insurance  information on file for billing purposes. [employer] offers a sliding scale Patient Assistance Program for patients  that do not have insurance, or whose insurance does not completely cover the cost of the ZIO monitor.   You must apply for the Patient Assistance Program to qualify for this discounted rate.     To apply, please call [employer] at [phone], select option 4, then select option 2, and ask to apply for Patient Assistance Program.  Theodore Demark will ask your household income, and how many people are in your household.  They will quote your out-of-pocket cost based on that information.  [employer] will also be able to set up a 65-month, interest-free payment plan if needed.  Applying the monitor   Shave hair from upper left chest.  Hold abrader disc by orange tab. Rub abrader in 40 strokes over the upper left chest as indicated in your monitor instructions.  Clean area with 4 enclosed alcohol pads. Let dry.  Apply patch as indicated in monitor instructions. Patch will be placed under collarbone on left side of chest with arrow pointing upward.  Rub patch adhesive wings for 2 minutes. Remove white label marked "1". Remove the white label marked "2". Rub patch adhesive wings for 2 additional minutes.  While looking in a mirror, press and release button in center of patch. A small green light will flash 3-4 times. This will be your only indicator that the monitor has been turned on. ?  Do not shower for the first 24 hours. You may shower after the first 24 hours.  Press the button if you feel a symptom. You will hear a small click. Record Date, Time and Symptom in the Patient Logbook.  When you are ready to remove the patch, follow instructions on the last 2 pages of the Patient Logbook. Stick patch monitor onto the last page of Patient Logbook.  Place Patient Logbook in the blue and white box.  Use locking tab on box and tape box closed securely.  The blue and white box has prepaid postage on it. Please  place it in the mailbox as soon as possible. Your physician should have your test results approximately 7 days after the monitor has been mailed back to [facility].  Call [place] at [phone] if you have questions regarding your ZIO XT patch monitor. Call them immediately if you see an orange light blinking on your monitor.  If your monitor falls off in less than 4 days, contact our Monitor department at [phone]. ?If your monitor becomes loose or falls off after 4 days call [employer] at [phone] for suggestions on securing your monitor.?  Follow-Up: At [facility], you and your health needs are our priority.  As part of our continuing mission to provide you with exceptional heart care, we have created designated Provider Care Teams.  These Care Teams include your primary Cardiologist (physician) and Advanced Practice Providers (APPs -  Physician Assistants and Nurse Practitioners) who all work together to provide you with the care you need, when you need it.  We recommend signing up for the patient portal called "MyChart".  Sign up information is provided on this After Visit Summary.  MyChart is used to connect with patients for Virtual Visits (Telemedicine).  Patients are able to view lab/test results, encounter notes, upcoming appointments, etc.  Non-urgent messages can be sent to your provider as well.   To learn more about what you can do with MyChart, go to [email or web address].    Your next appointment:   12 month(s)  The format for your next appointment:   In Person  Provider:   Donato Heinz, MD       Signed, Donato Heinz, MD  02/27/2021 9:06 PM    [facility]

## 2021-02-27 ENCOUNTER — Encounter: Payer: Self-pay | Admitting: Cardiology

## 2021-02-27 ENCOUNTER — Ambulatory Visit (INDEPENDENT_AMBULATORY_CARE_PROVIDER_SITE_OTHER): Payer: Medicare Other

## 2021-02-27 ENCOUNTER — Ambulatory Visit: Payer: Medicare Other | Admitting: Cardiology

## 2021-02-27 ENCOUNTER — Other Ambulatory Visit: Payer: Self-pay

## 2021-02-27 VITALS — BP 130/72 | HR 62 | Ht 59.0 in | Wt 123.6 lb

## 2021-02-27 DIAGNOSIS — R002 Palpitations: Secondary | ICD-10-CM

## 2021-02-27 DIAGNOSIS — I471 Supraventricular tachycardia: Secondary | ICD-10-CM | POA: Diagnosis not present

## 2021-02-27 DIAGNOSIS — R42 Dizziness and giddiness: Secondary | ICD-10-CM | POA: Diagnosis not present

## 2021-02-27 MED ORDER — METOPROLOL SUCCINATE ER 25 MG PO TB24: 25.0000 mg | ORAL_TABLET | Freq: Every day | ORAL | 3 refills | Status: DC

## 2021-02-27 NOTE — Progress Notes (Unsigned)
Enrolled patient for a 3 day Zio XT monitor to be mailed to patients home  

## 2021-02-27 NOTE — Patient Instructions (Signed)
Medication Instructions:  DECREASE metoprolol succinate (Toprol XL) to 25 mg once daily  *If you need a refill on your cardiac medications before your next appointment, please call your pharmacy*  Testing/Procedures: [place] Monitor Instructions   Your physician has requested you wear a ZIO patch monitor for _3_ days.  This is a single patch monitor.   [employer] supplies one patch monitor per enrollment. Additional stickers are not available. Please do not apply patch if you will be having a Nuclear Stress Test, Echocardiogram, Cardiac CT, MRI, or Chest Xray during the period you would be wearing the monitor. The patch cannot be worn during these tests. You cannot remove and re-apply the ZIO XT patch monitor.  Your ZIO patch monitor will be sent Fed Ex from [employer] directly to your home address. It may take 3-5 days to receive your monitor after you have been enrolled.  Once you have received your monitor, please review the enclosed instructions. Your monitor has already been registered assigning a specific monitor serial # to you.  Billing and Patient Assistance Program Information   We have supplied [employer] with any of your insurance information on file for billing purposes. [employer] offers a sliding scale Patient Assistance Program for patients that do not have insurance, or whose insurance does not completely cover the cost of the ZIO monitor.   You must apply for the Patient Assistance Program to qualify for this discounted rate.     To apply, please call [employer] at [phone], select option 4, then select option 2, and ask to apply for Patient Assistance Program.  Theodore Demark will ask your household income, and how many people are in your household.  They will quote your out-of-pocket cost based on that information.  [employer] will also be able to set up a 60-month, interest-free payment plan if needed.  Applying the monitor   Shave hair from upper left chest.  Hold abrader  disc by orange tab. Rub abrader in 40 strokes over the upper left chest as indicated in your monitor instructions.  Clean area with 4 enclosed alcohol pads. Let dry.  Apply patch as indicated in monitor instructions. Patch will be placed under collarbone on left side of chest with arrow pointing upward.  Rub patch adhesive wings for 2 minutes. Remove white label marked "1". Remove the white label marked "2". Rub patch adhesive wings for 2 additional minutes.  While looking in a mirror, press and release button in center of patch. A small green light will flash 3-4 times. This will be your only indicator that the monitor has been turned on. ?  Do not shower for the first 24 hours. You may shower after the first 24 hours.  Press the button if you feel a symptom. You will hear a small click. Record Date, Time and Symptom in the Patient Logbook.  When you are ready to remove the patch, follow instructions on the last 2 pages of the Patient Logbook. Stick patch monitor onto the last page of Patient Logbook.  Place Patient Logbook in the blue and white box.  Use locking tab on box and tape box closed securely.  The blue and white box has prepaid postage on it. Please place it in the mailbox as soon as possible. Your physician should have your test results approximately 7 days after the monitor has been mailed back to [facility].  Call [place] at [phone] if you have questions regarding your ZIO XT patch monitor. Call  them immediately if you see an orange light blinking on your monitor.  If your monitor falls off in less than 4 days, contact our Monitor department at [phone]. ?If your monitor becomes loose or falls off after 4 days call [employer] at [phone] for suggestions on securing your monitor.?  Follow-Up: At [facility], you and your health needs are our priority.  As part of our continuing mission to provide you with exceptional heart care, we have created  designated Provider Care Teams.  These Care Teams include your primary Cardiologist (physician) and Advanced Practice Providers (APPs -  Physician Assistants and Nurse Practitioners) who all work together to provide you with the care you need, when you need it.  We recommend signing up for the patient portal called "MyChart".  Sign up information is provided on this After Visit Summary.  MyChart is used to connect with patients for Virtual Visits (Telemedicine).  Patients are able to view lab/test results, encounter notes, upcoming appointments, etc.  Non-urgent messages can be sent to your provider as well.   To learn more about what you can do with MyChart, go to [email or web address].    Your next appointment:   12 month(s)  The format for your next appointment:   In Person  Provider:   Donato Heinz, MD

## 2021-03-02 DIAGNOSIS — I471 Supraventricular tachycardia: Secondary | ICD-10-CM

## 2021-03-20 ENCOUNTER — Telehealth: Payer: Self-pay | Admitting: Cardiology

## 2021-03-20 NOTE — Telephone Encounter (Signed)
Patient is calling for her results to her monitor results.  She said a VM can be left with results.

## 2021-03-20 NOTE — Telephone Encounter (Signed)
I do not see Dr Newman Nickels results, pt notified she will await call. Will forward to notify

## 2021-03-28 NOTE — Telephone Encounter (Signed)
Per result note- left message to call back 03/28/21

## 2021-03-28 NOTE — Telephone Encounter (Signed)
See result note.  

## 2021-04-10 ENCOUNTER — Encounter: Payer: Self-pay | Admitting: *Deleted

## 2021-04-10 NOTE — Telephone Encounter (Signed)
Letter of results sent to pt asking her to call

## 2021-04-17 MED ORDER — METOPROLOL SUCCINATE ER 50 MG PO TB24: 50.0000 mg | ORAL_TABLET | Freq: Every day | ORAL | 1 refills | Status: DC

## 2021-04-17 NOTE — Telephone Encounter (Signed)
Returned call to pt informed that we did try to reach her here in [place]. Explained monitor results and medication change. Sent new rx to [facility] as requested. Results sent do Dr Oda Cogan as requested. Informed pt that she should let us know when she will not be at home and in [place]. Verbalized understanding.

## 2021-04-17 NOTE — Addendum Note (Signed)
Addended by: Waylan Rocher on: 04/17/2021 03:26 PM   Modules accepted: Orders

## 2021-04-17 NOTE — Telephone Encounter (Signed)
Patient is returning call to discuss monitor results. She states she normally stays in [place] during the winter and she has a different home phone number there. Please return call to patient at [phone]. She is also requesting to have results faxed to Dr. Alfonzo Feller of [place]. She states he is her PCP while in [place]. She would also like to confirm whether the results have been sent to her  PCP, Dr. Tomi Bamberger. Please advise.  Phone #: [phone] Fax#: [phone]

## 2021-04-23 ENCOUNTER — Telehealth: Payer: Self-pay | Admitting: Cardiology

## 2021-04-23 NOTE — Telephone Encounter (Signed)
Pt c/o medication issue:  1. Name of Medication:  metoprolol succinate (TOPROL-XL) 50 MG 24 hr tablet  2. How are you currently taking this medication (dosage and times per day)?   3. Are you having a reaction (difficulty breathing--STAT)?   4. What is your medication issue?   Patient would like to know how she should be taking Metoprolol. Please advise.

## 2021-04-23 NOTE — Telephone Encounter (Signed)
Returned call to patient who was calling to verify instructions regarding her Metoprolol Succinate 50mg  daily.   Advised patient of the following: Donato Heinz, MD      Frequent extra beats from top chamber of the heart and episodes of SVT, though SVT burden improved from prior monitor.  Would recommend increasing Toprol-XL to 50 mg daily.   Advised patient that medication was already sent to pharmacy. Scheduled patient a follow up to see Dr. Gardiner Rhyme when she comes back to [facility] in May. Scheduled for 5/10.  All questions were answered.  Advised patient to call back to office with any issues, questions, or concerns. Patient verbalized understanding.

## 2021-05-05 DIAGNOSIS — Z1239 Encounter for other screening for malignant neoplasm of breast: Secondary | ICD-10-CM | POA: Diagnosis not present

## 2021-06-06 DIAGNOSIS — E785 Hyperlipidemia, unspecified: Secondary | ICD-10-CM | POA: Diagnosis not present

## 2021-06-06 DIAGNOSIS — R799 Abnormal finding of blood chemistry, unspecified: Secondary | ICD-10-CM | POA: Diagnosis not present

## 2021-06-06 IMAGING — RF DG HIP (WITH PELVIS) OPERATIVE*R*
1 series · 2 of 2 positions shown · non-contrast
Comparison: None.

CLINICAL DATA: Right hip replacement.

EXAM:
OPERATIVE RIGHT HIP (WITH PELVIS IF PERFORMED)
TECHNIQUE: Fluoroscopic spot image(s) were submitted for interpretation
post-operatively.

[Series 1: unknown protocol · 0.20mm/px · 2 of 2 slices shown]
[im 1/2]
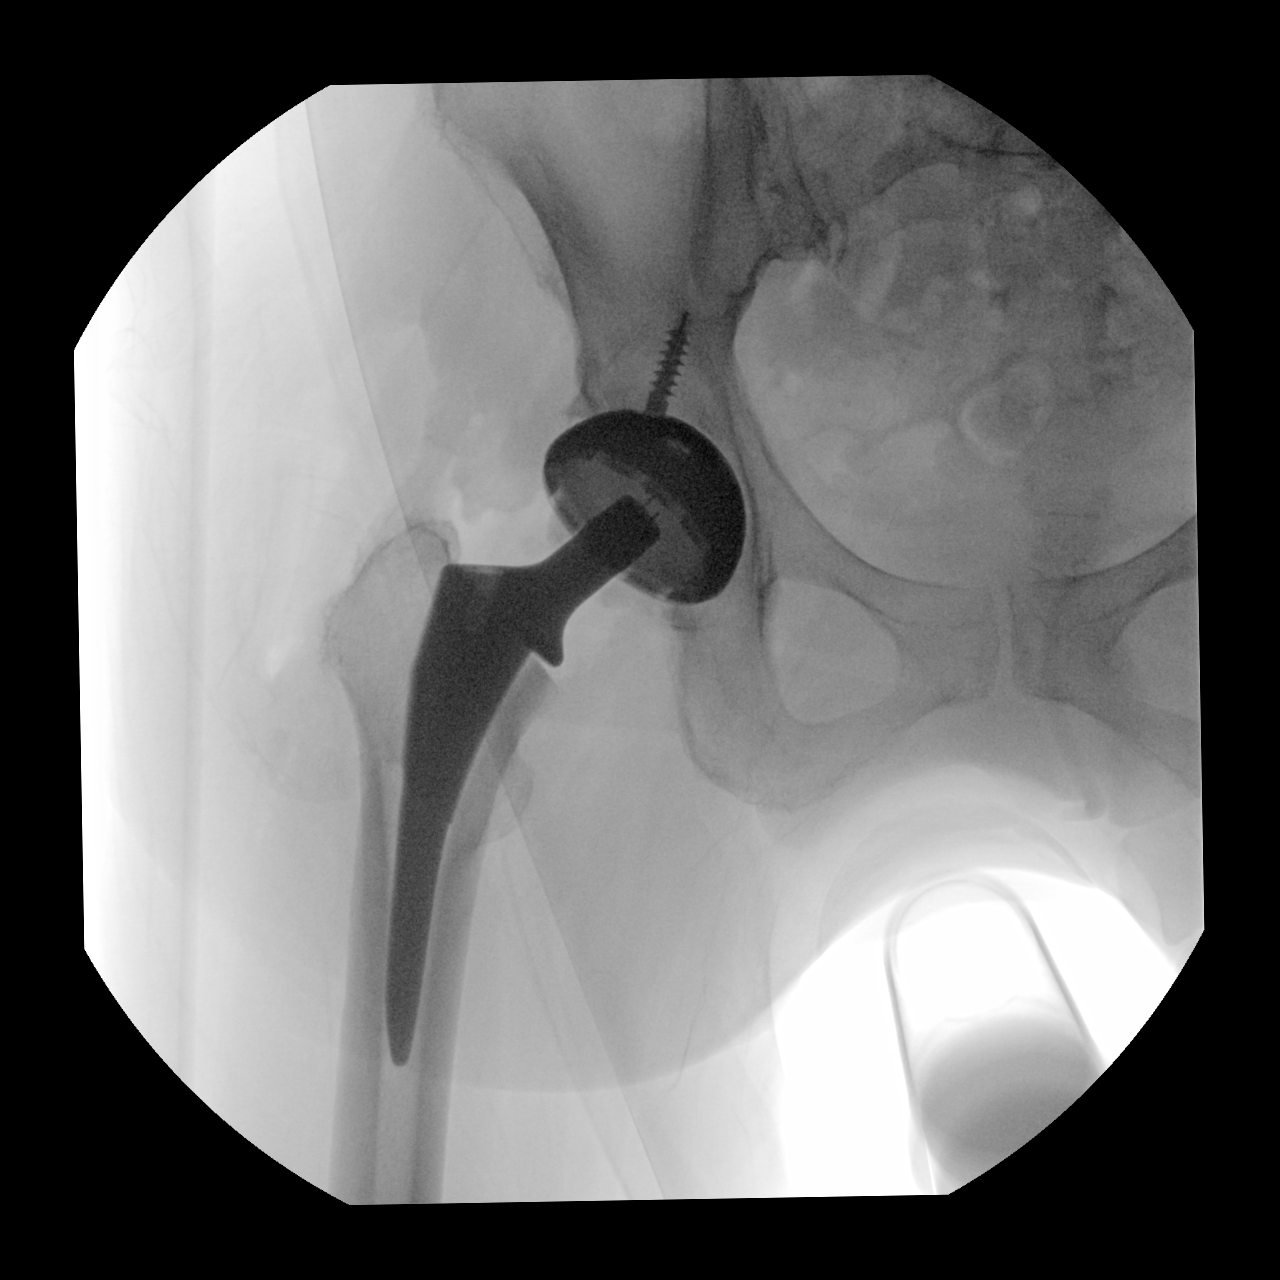
[im 2/2]
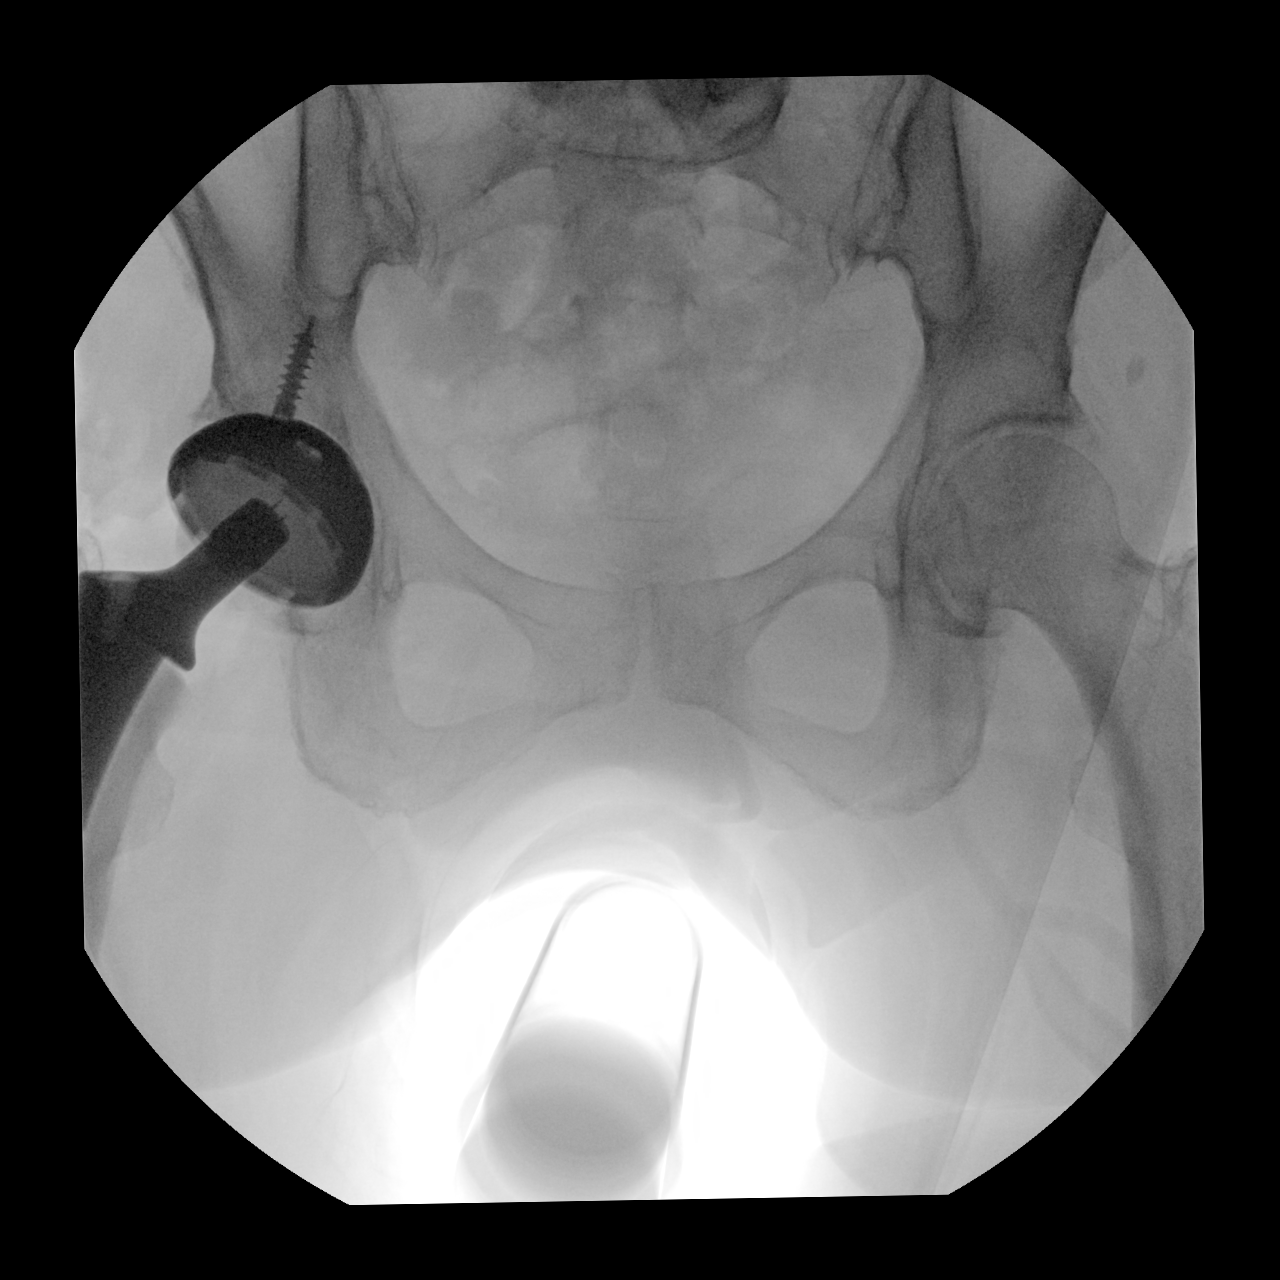

[2 of 2 positions shown; findings below may reference images not displayed]

FINDINGS: Two fluoroscopic spot views obtained in the operating room during
right hip arthroplasty. Femoral stem and acetabular components are
present. Total fluoroscopy time 12 seconds. Total dose 1.24 mGy
IMPRESSION: Fluoroscopic spot views during right hip arthroplasty.

## 2021-06-06 IMAGING — DX DG PORTABLE PELVIS
1 series · 1 of 1 positions shown · non-contrast
Comparison: Intraoperative study October 10, 2019

CLINICAL DATA: Status post right total hip replacement

EXAM:
PORTABLE PELVIS 1-2 VIEWS

[pelvis ap]
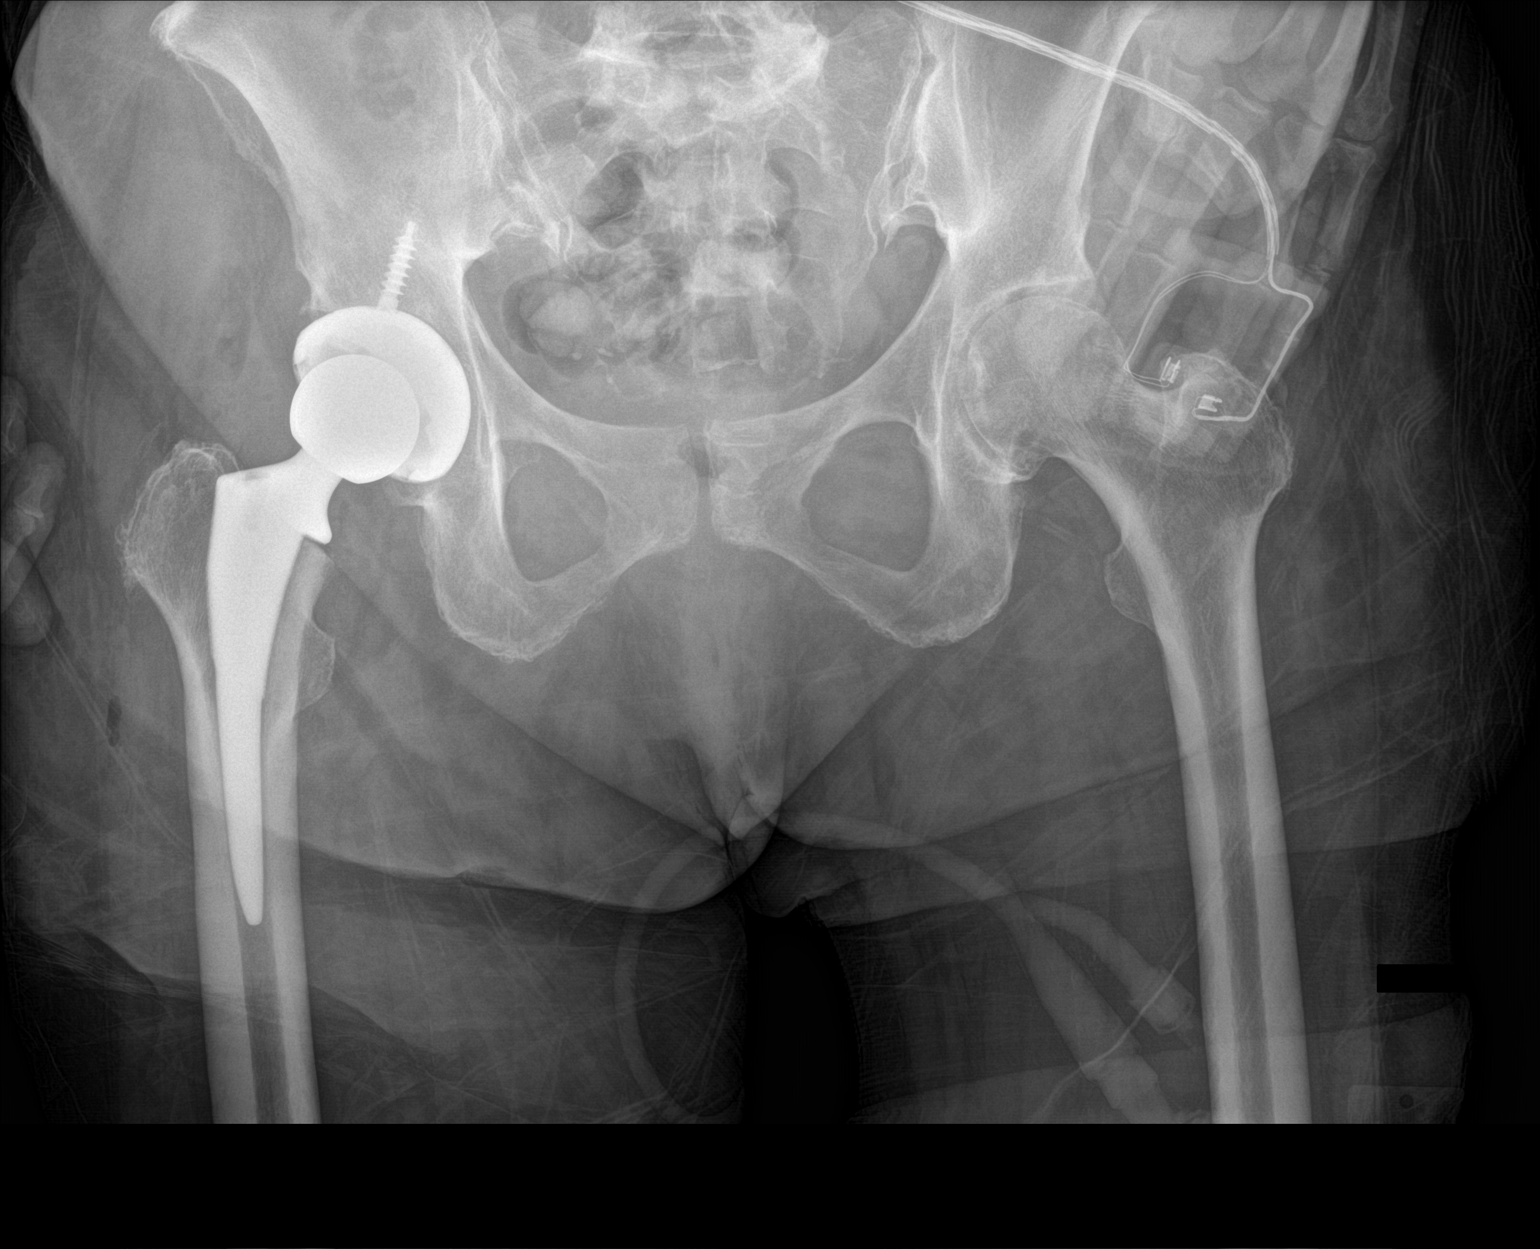

[1 of 1 positions shown; findings below may reference images not displayed]

FINDINGS: Frontal view obtained. There is a total hip replacement on the right
with prosthetic components well-seated. No acute fracture or
dislocation. There is moderate narrowing of the left hip joint.
IMPRESSION: Total hip replacement on the right with prosthetic components
well-seated on frontal view. No fracture or dislocation. Moderate
osteoarthritic change left hip joint.

## 2021-06-19 DIAGNOSIS — M810 Age-related osteoporosis without current pathological fracture: Secondary | ICD-10-CM | POA: Diagnosis not present

## 2021-07-10 DIAGNOSIS — E785 Hyperlipidemia, unspecified: Secondary | ICD-10-CM | POA: Diagnosis not present

## 2021-07-10 DIAGNOSIS — R002 Palpitations: Secondary | ICD-10-CM | POA: Diagnosis not present

## 2021-07-10 DIAGNOSIS — M25559 Pain in unspecified hip: Secondary | ICD-10-CM | POA: Diagnosis not present

## 2021-07-10 DIAGNOSIS — Z9889 Other specified postprocedural states: Secondary | ICD-10-CM | POA: Diagnosis not present

## 2021-07-10 DIAGNOSIS — I471 Supraventricular tachycardia: Secondary | ICD-10-CM | POA: Diagnosis not present

## 2021-07-10 DIAGNOSIS — Z0001 Encounter for general adult medical examination with abnormal findings: Secondary | ICD-10-CM | POA: Diagnosis not present

## 2021-08-04 NOTE — Progress Notes (Signed)
?Cardiology Office Note:   ? ?Date:  08/06/2021  ? ?ID:  Alexis Hines, DOB 10/27/1933, MRN [id number] ? ?PCP:  Rita Ohara, MD  ?Cardiologist:  Donato Heinz, MD  ?Electrophysiologist:  None  ? ?Referring MD: Rita Ohara, MD  ? ?Chief Complaint  ?Patient presents with  ? Fatigue  ? ? ?History of Present Illness:   ? ?Alexis Hines is a 88 y.o. female with a hx of basal cell cancer, squamous cell carcinoma of skin, hyperlipidemia who presents for follow-up appointment. She was referred by Dr. Tomi Bamberger for evaluation of EKG abnormalities and preop evaluation, initially seen on 08/24/2019.  Plan for hip surgery on 08/29/2019.  Preoperative EKG done at Dr. Johnsie Kindred office 5/24 was concerning for AV dissociation and accelerated junctional rhythm.  On my review, appears to be sinus rhythm with frequent PACs.  Underwent hip surgery 0/34/91, no complications.  At initial clinic visit, she reported having palpitations and episodes of lightheadedness that lasted less than 1 minute. ? ?She underwent a nuclear stress test in [place] on 07/05/2017, which showed normal EF, no perfusion defects.  Echocardiogram on 08/31/2019 showed LVEF 79-15%, grade 1 diastolic function, normal RV function, mild RV enlargement, degenerative mitral valve with mild MR. Zio patch x3 days on 09/14/2019 showed frequent PACs (11% of beats), with frequent episodes of SVT lasting up to 20 seconds.  Started on metoprolol.  Zio patch x3 days on 03/11/2021 on metoprolol showed 412 episodes of SVT with longest lasting 12 beats, frequent PACs (8.4% of beats with frequent supraventricular couplets (8.3%) and occasional supraventricular triplets (3.3%). ? ?Since last clinic visit, she reports that she is doing well.  Denies any chest pain, dyspnea, lightheadedness, syncope, lower extremity edema, or palpitations.  Reports she has been feeling fatigued.  She does an exercise class four times per week, denies any exertional symptoms. ? ? ?Past Medical History:   ?Diagnosis Date  ? Abnormal TSH 2004  ? normal since  ? Arthritis   ? Basal cell cancer   ? nose; Dr. Allyson Sabal   ? Diverticulosis 10/2005  ? Dizziness   ? Migraine childhood  ? resolved  ? PAC (premature atrial contraction)   ? frequent on cardiac monitor 09/14/19  ? Pure hypercholesterolemia   ? Radial styloid tenosynovitis (de quervain) 11/2018  ? injected by Dr. Fredna Dow 11/2018, 02/2019  ? SCCA (squamous cell carcinoma) of skin 12/2015  ? right temple  ? Squamous cell carcinoma of skin 02/15/2017  ? in situ-left jaw (CX35FU)  ? ? ?Past Surgical History:  ?Procedure Laterality Date  ? BREAST BIOPSY Left 1984  ? benign  ? EYE SURGERY    ? Bil and eyelids lifted  ? left hip intra-articular steriod injection  03/13/2014  ? Mohs procedure    ? REVERSE SHOULDER ARTHROPLASTY Left 12/26/2020  ? Procedure: REVERSE SHOULDER ARTHROPLASTY;  Surgeon: Justice Britain, MD;  Location: [facility];  Service: Orthopedics;  Laterality: Left;  152mn  ? TONSILLECTOMY  age 89 ? TOTAL HIP ARTHROPLASTY Right 10/10/2019  ? Procedure: TOTAL HIP ARTHROPLASTY ANTERIOR APPROACH;  Surgeon: OParalee Cancel MD;  Location: [facility];  Service: Orthopedics;  Laterality: Right;  70 mins  ? TOTAL KNEE ARTHROPLASTY Left 07/2003  ? Dr. CTheda Sers ? TOTAL KNEE ARTHROPLASTY Right 09/03/2017  ? Procedure: RIGHT TOTAL KNEE ARTHROPLASTY;  Surgeon: CSydnee Cabal MD;  Location: [facility];  Service: Orthopedics;  Laterality: Right;  ? ? ?Current Medications: ?Current Meds  ?Medication Sig  ? atorvastatin (LIPITOR) 40  MG tablet Take 1 tablet (40 mg total) by mouth at bedtime.  ? Biotin 10000 MCG TABS Take 10,000 mcg by mouth in the morning.  ? Calcium Carbonate-Vitamin D (CALTRATE 600+D PO) Take 1 tablet by mouth daily.  ? docusate sodium (COLACE) 100 MG capsule Take 100 mg by mouth daily as needed for mild constipation.  ? MELATONIN PO Take 1 tablet by mouth at bedtime as needed (sleep).  ? Multiple Vitamin (MULTIVITAMIN WITH MINERALS) TABS tablet Take 1 tablet by mouth in the  morning. CENTRUM MULTIVITAMIN  ? Omega-3 Fatty Acids (FISH OIL) 1000 MG CAPS Take 1 capsule by mouth daily.  ? Probiotic Product (PROBIOTIC DAILY) CAPS Take 1 capsule by mouth daily with lunch.  ? Turmeric 400 MG CAPS Take 1 capsule by mouth daily.  ? Wheat Dextrin (BENEFIBER PO) Take by mouth.  ? [DISCONTINUED] docusate sodium (COLACE) 100 MG capsule Take 1 capsule (100 mg total) by mouth 2 (two) times daily. (Patient taking differently: Take 100 mg by mouth daily as needed.)  ? [DISCONTINUED] ipratropium (ATROVENT) 0.06 % nasal spray Place 2 sprays into each nostril 3-4 times daily for runny nose  ? [DISCONTINUED] metoprolol succinate (TOPROL-XL) 50 MG 24 hr tablet Take 1 tablet (50 mg total) by mouth daily.  ? [DISCONTINUED] naproxen (NAPROSYN) 500 MG tablet Take 1 tablet (500 mg total) by mouth 2 (two) times daily with a meal.  ?  ? ?Allergies:   Patient has no known allergies.  ? ?Social History  ? ?Socioeconomic History  ? Marital status: Widowed  ?  Spouse name: Not on file  ? Number of children: 2  ? Years of education: Not on file  ? Highest education level: Not on file  ?Occupational History  ? Occupation: Retired Psychologist, prison and probation services  ?Tobacco Use  ? Smoking status: Never  ? Smokeless tobacco: Never  ?Vaping Use  ? Vaping Use: Never used  ?Substance and Sexual Activity  ? Alcohol use: Yes  ?  Comment: occasional glass of wine.  ? Drug use: No  ? Sexual activity: Not Currently  ?Other Topics Concern  ? Not on file  ?Social History Narrative  ? Widowed. Lives 1/2 the year in [place]. Her husband (retired Tax adviser) died from liver cancer in 2011/04/16.  Daughter lives in [place]; 2 grandchildren here, and 2 in [place]  ? ?Social Determinants of Health  ? ?Financial Resource Strain: Not on file  ?Food Insecurity: Not on file  ?Transportation Needs: Not on file  ?Physical Activity: Not on file  ?Stress: Not on file  ?Social Connections: Not on file  ?  ? ?Family History: ?The patient's family history includes  Cancer in her sister; Cancer (age of onset: 52) in her sister; Gout in her son; Heart disease (age of onset: 33) in her brother; Hyperlipidemia in her brother; Rheum arthritis in her daughter. ? ?ROS:   ?Please see the history of present illness.    ? All other systems reviewed and are negative. ? ?EKGs/Labs/Other Studies Reviewed:   ? ?The following studies were reviewed today: ? ? ?EKG:   ?08/06/21: Normal sinus rhythm, rate 65, no ST abnormality ?02/27/21: Sinus rhythm, PACs, rate 62 ? ?Recent Labs: ?12/10/2020: BUN 18; Creatinine, Ser 0.73; Hemoglobin 15.0; Platelets 285; Potassium 4.3; Sodium 143  ?Recent Lipid Panel ?   ?Component Value Date/Time  ? CHOL 181 11/09/2019 0947  ? TRIG 117 11/09/2019 0947  ? HDL 62 11/09/2019 0947  ? CHOLHDL 2.9 11/09/2019 0947  ? CHOLHDL 1.9 12/23/2016  9244  ? VLDL 26 12/26/2015 0706  ? [place] 98 11/09/2019 0947  ? LDLCALC 65 12/23/2016 0816  ? ? ?Physical Exam:   ? ?VS:  BP 130/68   Pulse 65   Ht 4' 10.5" (1.486 m)   Wt 124 lb 6.4 oz (56.4 kg)   BMI 25.56 kg/m?    ? ?Wt Readings from Last 3 Encounters:  ?08/06/21 124 lb 6.4 oz (56.4 kg)  ?02/27/21 123 lb 9.6 oz (56.1 kg)  ?12/26/20 123 lb 6.4 oz (56 kg)  ?  ? ?GEN:  in no acute distress ?HEENT: Normal ?NECK: No JVD; No carotid bruits ?LYMPHATICS: No lymphadenopathy ?CARDIAC: irregular, tachycardic, no murmurs ?RESPIRATORY:  Clear to auscultation without rales, wheezing or rhonchi  ?ABDOMEN: Soft, non-tender, non-distended ?MUSCULOSKELETAL:  No edema; No deformity  ?SKIN: Warm and dry ?NEUROLOGIC:  Alert and oriented x 3 ?PSYCHIATRIC:  Normal affect  ? ?ASSESSMENT:   ? ?1. Abnormal EKG   ?2. Lightheadedness   ?3. Palpitations   ?4. Near syncope   ? ? ? ?PLAN:   ? ? ?Lightheadedness/near syncope/palpitations: Frequent PACs/runs of SVT on EKG. Zio patch x3 days on 09/14/2019 showed frequent PACs (11% of beats), with frequent episodes of SVT lasting up to 20 seconds.  Echocardiogram on 08/31/2019 showed LVEF 62-86%, grade 1 diastolic  function, normal RV function, mild RV enlargement, degenerative mitral valve with mild MR.  Started on metoprolol.  Zio patch x3 days on 03/11/2021 on metoprolol showed 412 episodes of SVT with longest lasting

## 2021-08-06 ENCOUNTER — Ambulatory Visit (INDEPENDENT_AMBULATORY_CARE_PROVIDER_SITE_OTHER): Payer: Medicare Other | Admitting: Cardiology

## 2021-08-06 VITALS — BP 130/68 | HR 65 | Ht 58.5 in | Wt 124.4 lb

## 2021-08-06 DIAGNOSIS — R9431 Abnormal electrocardiogram [ECG] [EKG]: Secondary | ICD-10-CM

## 2021-08-06 DIAGNOSIS — R002 Palpitations: Secondary | ICD-10-CM | POA: Diagnosis not present

## 2021-08-06 DIAGNOSIS — R55 Syncope and collapse: Secondary | ICD-10-CM | POA: Diagnosis not present

## 2021-08-06 DIAGNOSIS — R42 Dizziness and giddiness: Secondary | ICD-10-CM | POA: Diagnosis not present

## 2021-08-06 MED ORDER — METOPROLOL SUCCINATE ER 25 MG PO TB24: 37.5000 mg | ORAL_TABLET | Freq: Every day | ORAL | 3 refills | Status: DC

## 2021-08-06 NOTE — Patient Instructions (Addendum)
Medication Instructions:  ?DECREASE metoprolol (Toprol XL) to 37.5 mg daily ? ?*If you need a refill on your cardiac medications before your next appointment, please call your pharmacy* ? ?Testing/Procedures: ?Your physician has requested that you have an echocardiogram. Echocardiography is a painless test that uses sound waves to create images of your heart. It provides your doctor with information about the size and shape of your heart and how well your heart?s chambers and valves are working. This procedure takes approximately one hour. There are no restrictions for this procedure. ? ?This will be done at our [facility] location:  [address] ? ?Follow-Up: ?At [facility], you and your health needs are our priority.  As part of our continuing mission to provide you with exceptional heart care, we have created designated Provider Care Teams.  These Care Teams include your primary Cardiologist (physician) and Advanced Practice Providers (APPs -  Physician Assistants and Nurse Practitioners) who all work together to provide you with the care you need, when you need it. ? ?We recommend signing up for the patient portal called "MyChart".  Sign up information is provided on this After Visit Summary.  MyChart is used to connect with patients for Virtual Visits (Telemedicine).  Patients are able to view lab/test results, encounter notes, upcoming appointments, etc.  Non-urgent messages can be sent to your provider as well.   ?To learn more about what you can do with MyChart, go to [email or web address].   ? ?Your next appointment:   ?12 month(s) ? ?The format for your next appointment:   ?In Person ? ?Provider:   ?Donato Heinz, MD { ? ? ?Important Information About Sugar ? ? ? ? ? ? ?

## 2021-08-06 NOTE — Addendum Note (Signed)
Addended by: Patria Mane A on: 08/06/2021 10:13 AM ? ? Modules accepted: Orders ? ?

## 2021-08-14 ENCOUNTER — Other Ambulatory Visit: Payer: Self-pay | Admitting: Family Medicine

## 2021-08-14 DIAGNOSIS — E7800 Pure hypercholesterolemia, unspecified: Secondary | ICD-10-CM

## 2021-08-14 NOTE — Telephone Encounter (Signed)
This refill came over. Do you recommend some type of appointment? Fasting med check? AWV?

## 2021-08-14 NOTE — Telephone Encounter (Signed)
Spoke with patient and she does not need this refilled. Got meds from [facility]. She also had annual done there and labs, etc. She has [employer] coming in a few weeks to her house. She will call and schedule an appt to come in and do an update with you and bring all her records and she would like to get a pneumonia vaccine while here.

## 2021-08-14 NOTE — Telephone Encounter (Signed)
Absolutely!  She has a PCP in [place], who usually does her labs, etc, and I think may do her physicals/AWV. She usually brings her files to visit.  Have her schedule a med check, have her bring records (and make copies of them when she comes, so we can scan in), and be fasting if she hasn't had any labs done by [facility] PCP in the last year (last we have was from 2022)

## 2021-08-18 ENCOUNTER — Ambulatory Visit (HOSPITAL_COMMUNITY): Payer: Medicare Other | Attending: Cardiovascular Disease

## 2021-08-18 DIAGNOSIS — R9431 Abnormal electrocardiogram [ECG] [EKG]: Secondary | ICD-10-CM | POA: Diagnosis not present

## 2021-08-19 LAB — ECHOCARDIOGRAM COMPLETE
Area-P 1/2: 3.17 cm2
P 1/2 time: 297 msec
S' Lateral: 1.8 cm

## 2021-08-29 ENCOUNTER — Telehealth: Payer: Self-pay | Admitting: Cardiology

## 2021-08-29 NOTE — Telephone Encounter (Signed)
Follow Up:     Patient would like her results from her Echo that she had on 08-18-21  Please.leave a message if she does not answer.

## 2021-08-29 NOTE — Telephone Encounter (Signed)
Donato Heinz, MD  08/20/2021  6:39 AM EDT     Enlargement of atria but otherwise no significant abnormality    Patient aware and verbalized understanding.

## 2021-09-11 ENCOUNTER — Ambulatory Visit (INDEPENDENT_AMBULATORY_CARE_PROVIDER_SITE_OTHER): Payer: Medicare Other | Admitting: Family Medicine

## 2021-09-11 ENCOUNTER — Encounter: Payer: Self-pay | Admitting: Family Medicine

## 2021-09-11 VITALS — BP 152/80 | HR 65 | Temp 97.7°F | Wt 124.6 lb

## 2021-09-11 DIAGNOSIS — E7800 Pure hypercholesterolemia, unspecified: Secondary | ICD-10-CM

## 2021-09-11 DIAGNOSIS — R030 Elevated blood-pressure reading, without diagnosis of hypertension: Secondary | ICD-10-CM

## 2021-09-11 DIAGNOSIS — J310 Chronic rhinitis: Secondary | ICD-10-CM

## 2021-09-11 DIAGNOSIS — I471 Supraventricular tachycardia: Secondary | ICD-10-CM

## 2021-09-11 DIAGNOSIS — Z23 Encounter for immunization: Secondary | ICD-10-CM

## 2021-09-11 NOTE — Patient Instructions (Addendum)
[facility] is an Field seismologist with many providers.  Continue the ipratroprium nasal spray. You can use it up to 4 times daily, if needed. Restart the Flonase (2 gentle sniffs into each nostril, once daily).  Schedule your mammogram (or not--your choice).  You can contact one of the hearing places into town (such as [facility]) to get your hearing and hearing aids checked out.  Your labs and bone density test all look good--thank you for bringing them.  Your blood pressure was elevated--possibly from rushing, but didn't come down much during the visit. Please try and check it elsewhere and let us know if stays above 135/85. Feel free to call us with your values or drop off a list. Be sure to limit the sodium in your diet (salt), and continue regular exercise.

## 2021-09-11 NOTE — Progress Notes (Signed)
Chief Complaint  Patient presents with   Nasal Congestion    For over one year  Wants pne vaccine  Wants referral for eye exam Was advised of free hearing aide and would like your thoughts  Check ears for wax   Patient presents for follow-up on many issues, bringing in labs from her primary care doctor in [place].  She has ongoing complaints of runny nose.  Currently, she uses ipratroprium 0.06%, which does help temporarily. She uses it 2x/day She has not been using any flonase or zyrtec.   She recalls using zyrtec x 6 weeks, stopped it in the end of March (while in [facility]).  It didn't seem to get any worse after stopping it, didn't seem to help much. Denies any sinus pain or discolored mucus.  Review of chart--in September she reported that zyrtec, flonase and ipratroprium worked well, but she had stopped because she didn't like taking so many medications, and the runny nose bothered others (her visiting brother) more than it bothered her.  Brings in copies of her labs from [facility]  06/06/21 Normal urine, CBC TSH 1.447 A1c 5.7 (normal, their range goes to 6.1 as normal), normal CK TC 176, TG 85, HDL 67, LDL 92 Fglu 89, rest of chem normal  DEXA 06/19/21 normal   She has hearing aids, doesn't seem to be hearing as well.  Would like to see if she needs new ones.  She didn't get her yearly eye exam in [place] this year, would like to see one in [place].  Hypertension:  compliant with her metoprolol.  Admits to salting her food more She brings in a paper from [employer], dated 08/31/21--BP was 130/74.  She goes to an exercise class 4x/week. She has some R shoulder pain.  She had surgery on the L--really doesn't want to have R shoulder surgery, it isn't that bad and the recovery was tougher than her other joint replacements.   PMH, PSH, SH reviewed  Outpatient Encounter Medications as of 09/11/2021  Medication Sig Note   atorvastatin (LIPITOR) 40 MG tablet Take 1 tablet (40 mg total) by mouth  at bedtime.    Biotin 10000 MCG TABS Take 10,000 mcg by mouth in the morning.    Calcium Carbonate-Vitamin D (CALTRATE 600+D PO) Take 1 tablet by mouth daily.    docusate sodium (COLACE) 100 MG capsule Take 100 mg by mouth daily as needed for mild constipation. 09/11/2021: Prn last used four days ago    IPRATROPIUM BROMIDE NA Place 1 spray into the nose in the morning and at bedtime.    metoprolol succinate (TOPROL-XL) 25 MG 24 hr tablet Take 1.5 tablets (37.5 mg total) by mouth daily.    Multiple Vitamin (MULTIVITAMIN WITH MINERALS) TABS tablet Take 1 tablet by mouth in the morning. CENTRUM MULTIVITAMIN    Omega-3 Fatty Acids (FISH OIL) 1000 MG CAPS Take 1 capsule by mouth daily.    Probiotic Product (PROBIOTIC DAILY) CAPS Take 1 capsule by mouth daily with lunch.    Turmeric 400 MG CAPS Take 1 capsule by mouth daily.    Wheat Dextrin (BENEFIBER PO) Take by mouth. 09/11/2021: Twice daily   cetirizine (ZYRTEC) 10 MG tablet Take 10 mg by mouth daily. (Patient not taking: Reported on 09/11/2021)    MELATONIN PO Take 1 tablet by mouth at bedtime as needed (sleep). (Patient not taking: Reported on 09/11/2021) 09/11/2021: Prn last three months ago   No facility-administered encounter medications on file as of 09/11/2021.   No  Known Allergies  ROS: no fever, chills, headaches, dizziness, chest pain, palpitations, shortness of breath.  Some decreased hearing, aids aren't working as well.  Runny nose, chronic, per HPI.   Denies urinary or GI complaints. Some R shoulder pain, no other joint pains. Mood are good.   PHYSICAL EXAM:  BP (!) 152/80   Pulse 65   Temp 97.7 F (36.5 C)   Wt 124 lb 9.6 oz (56.5 kg)   SpO2 96%   BMI 25.60 kg/m   158/80 on repeat by MD  Well-appearing female, who appears somewhat younger than her stated age, in good spirits HEENT: conjunctiva and sclera are clear, EOMI. TM's and EAC's are normal, mild nonobstructive cerumen on the left, clear on the  right. Nose--deviated septum, mod edema of turbinates on the right, some clear drainage, no purulence.  Sinuses are nontender. Neck: no lymphadenopathy, thyromegaly or cruit Heart: regular rate and rhythm, no murmur or ectopy Lungs: clear bilaterally Back: no spinal or CVA tenderness Abdomen: soft, nontender Extremities: no edema Psych: normal mood, affect, hygiene and grooming. Neuro: alert and oriented, normal gait. Cranial nerves intact.   ASSESSMENT/PLAN:  Chronic rhinitis - restart flonase (+/- zyrtec), cont ipratroprium (can use up to TID-QID prn if needed)  Need for pneumococcal vaccination - Plan: Pneumococcal polysaccharide vaccine 23-valent greater than or equal to 2yo subcutaneous/IM  Paroxysmal SVT (supraventricular tachycardia) (HCC) - asymptomatic, doing well on BB  Elevated blood pressure reading without diagnosis of hypertension - pt on BB for SVT. Elevated BP likely related to salting food. To cut back on sodium and monitor BP regularly and send [place] list. Adjust meds if remains elevated  Pure hypercholesterolemia - lipids at goal on current regimen, per March labs done in [facility]   This 88 yo patient has her primary care doctor and wellness visits done in [place], where she lives half the year. She keeps [place] updated on what is done there, and I am her PCP while in [place]. She continues to do quite well.  Suggested a practice for her eye exam. Discussed recs re: mammograms (yearly vs q2 years, vs okay to choose to stop screening) Suggested a location for her to get her hearing evaluated for new hearing aids.  BP was elevated today.  Reviewed low Na diet. Has BP monitor at home.  To check regularly, and to let us know values.  Adjust meds if remains consistently >135/85.  She was rushed when she got here today, but BP did not improve with 2 rechecks during visit.  I spent 36 minutes dedicated to the care of this patient, including pre-visit review of records, face to face time,  post-visit ordering of testing and documentation.

## 2021-09-15 ENCOUNTER — Other Ambulatory Visit: Payer: Self-pay | Admitting: Family Medicine

## 2021-09-15 NOTE — Telephone Encounter (Signed)
Are you okay with the 2 refills on this? Called patient and she does need this-she is out. She said he bp is still running a but high. 150's-160/80's. She will take for another few days and call back.

## 2021-09-22 ENCOUNTER — Telehealth: Payer: Self-pay

## 2021-09-22 NOTE — Telephone Encounter (Signed)
Advise pt to increase the metoprolol from 1.5 tablets daily to 2 tablets daily. Continue to monitor BP and pulse. Record these, and bring the list to a visit in 2 weeks. Schedule OV in 2 weeks.

## 2021-09-23 DIAGNOSIS — H905 Unspecified sensorineural hearing loss: Secondary | ICD-10-CM | POA: Diagnosis not present

## 2021-09-23 NOTE — Telephone Encounter (Signed)
Left message for pt to call me back 

## 2021-09-25 ENCOUNTER — Other Ambulatory Visit: Payer: Self-pay | Admitting: Family Medicine

## 2021-09-25 DIAGNOSIS — E7800 Pure hypercholesterolemia, unspecified: Secondary | ICD-10-CM

## 2021-09-25 NOTE — Telephone Encounter (Signed)
Lvm for pt to call back to advise if she needed before her appt 10/07/21.

## 2021-10-06 NOTE — Progress Notes (Unsigned)
  No chief complaint on file.   Patient presents for follow up on blood pressure. She had been on 37.'5mg'$  of metoprolol for SVT through cardiology. Her BP was elevated on recent visit (152/80).  She reported salting her food. She was advised to cut back on sodium, and monitor BP.   She contacted [place] 6/26 with a list of BP's ranging from 132-168/76-89, pulse 69-80. She was advised to increase the metoprolol to '50mg'$  and presents today to follow up on this change.   BP's are running Pulse is running   BP Readings from Last 3 Encounters:  09/11/21 (!) 152/80  08/06/21 130/68  02/27/21 130/72     PMH, PSH, SH reviewed    ROS: no fever, chills, headaches, dizziness, chest pain, palpitations, shortness of breath.  Some decreased hearing, aids aren't working as well.   Runny nose, chronic ?improved after restarting flonase and with ipratroprium?  Denies urinary or GI complaints. Some R shoulder pain, no other joint pains.  Mood are good. ?anxiety?   PHYSICAL EXAM:  There were no vitals taken for this visit.  Wt Readings from Last 3 Encounters:  09/11/21 124 lb 9.6 oz (56.5 kg)  08/06/21 124 lb 6.4 oz (56.4 kg)  02/27/21 123 lb 9.6 oz (56.1 kg)   Well-appearing female, who appears somewhat younger than her stated age 88: conjunctiva and sclera are clear, EOMI.  Neck: no lymphadenopathy, thyromegaly or bruit Heart: regular rate and rhythm, no murmur or ectopy Lungs: clear bilaterally Back: no spinal or CVA tenderness Abdomen: soft, nontender Extremities: no edema Psych: normal mood, affect, hygiene and grooming. Neuro: alert and oriented, normal gait. Cranial nerves intact.     ASSESSMENT/PLAN:

## 2021-10-07 ENCOUNTER — Ambulatory Visit (INDEPENDENT_AMBULATORY_CARE_PROVIDER_SITE_OTHER): Payer: Medicare Other | Admitting: Family Medicine

## 2021-10-07 ENCOUNTER — Encounter: Payer: Self-pay | Admitting: Family Medicine

## 2021-10-07 VITALS — BP 130/70 | HR 72 | Ht 58.5 in | Wt 122.4 lb

## 2021-10-07 DIAGNOSIS — I10 Essential (primary) hypertension: Secondary | ICD-10-CM

## 2021-10-07 DIAGNOSIS — M25511 Pain in right shoulder: Secondary | ICD-10-CM | POA: Diagnosis not present

## 2021-10-07 DIAGNOSIS — G8929 Other chronic pain: Secondary | ICD-10-CM | POA: Diagnosis not present

## 2021-10-07 DIAGNOSIS — I471 Supraventricular tachycardia: Secondary | ICD-10-CM

## 2021-10-07 NOTE — Patient Instructions (Addendum)
Please continue to read labels and try and limit the sodium in your diet. Try and avoid salting salads and other foods. Use more garlic/onion/herbs and other flavorful foods to give the food more taste.  You can use more pepper, but not salt.  Stay away from processed foods, soy sauces, etc as we discussed during visit.  Continue to limit the Nutrisystem meals such that you don't exceed the 2000-2500 mg/day of sodium between all the foods you eat in one day.  Continue regular exercise.  Overall your blood pressure is good--sometimes is high at home, other times it is quite low. So, with those fluctuations, I'm going to keep the dose the same ('50mg'$  daily).  Your home blood pressure monitor is accurate.  You may cut back on checking to just a few times/week (and check more if you see it high, to ensure that it comes back down).  Follow up with Dr. Onnie Graham if your right shoulder pain is bad enough to consider more treatment--you can discuss injections vs surgery. I recommend trying to limit the use of Advil (use it sparingly). You can use topical voltaren gel and tylenol regularly for pain. If you need to use advil, take it with food, and don't take it daily.  If you end up needing advil daily, we should have you on something to protect your stomach and monitor your kidney function.

## 2021-11-05 ENCOUNTER — Ambulatory Visit: Payer: Medicare Other | Admitting: Dermatology

## 2021-11-19 DIAGNOSIS — M19011 Primary osteoarthritis, right shoulder: Secondary | ICD-10-CM | POA: Diagnosis not present

## 2021-11-19 DIAGNOSIS — Z96612 Presence of left artificial shoulder joint: Secondary | ICD-10-CM | POA: Diagnosis not present

## 2021-11-24 DIAGNOSIS — D1801 Hemangioma of skin and subcutaneous tissue: Secondary | ICD-10-CM | POA: Diagnosis not present

## 2021-11-24 DIAGNOSIS — D2271 Melanocytic nevi of right lower limb, including hip: Secondary | ICD-10-CM | POA: Diagnosis not present

## 2021-11-24 DIAGNOSIS — D225 Melanocytic nevi of trunk: Secondary | ICD-10-CM | POA: Diagnosis not present

## 2021-11-24 DIAGNOSIS — L821 Other seborrheic keratosis: Secondary | ICD-10-CM | POA: Diagnosis not present

## 2021-11-24 DIAGNOSIS — D2272 Melanocytic nevi of left lower limb, including hip: Secondary | ICD-10-CM | POA: Diagnosis not present

## 2021-11-26 ENCOUNTER — Telehealth: Payer: Self-pay | Admitting: *Deleted

## 2021-11-26 NOTE — Telephone Encounter (Signed)
Patient called and has been using flonase and ipratropium nasal sprays-her nose is becoming irritated after three months. She said it helps but she still wakes up with a runny nose, should she change to something else for a while?

## 2021-11-26 NOTE — Telephone Encounter (Signed)
She can back off in frequency of what she is using, and use some saline spray in between for irritation. Office visit for further concerns

## 2021-11-26 NOTE — Telephone Encounter (Signed)
Patient advised.

## 2021-11-27 ENCOUNTER — Other Ambulatory Visit: Payer: Self-pay | Admitting: Family Medicine

## 2021-11-27 DIAGNOSIS — E7800 Pure hypercholesterolemia, unspecified: Secondary | ICD-10-CM

## 2021-12-22 ENCOUNTER — Telehealth: Payer: Self-pay | Admitting: Family Medicine

## 2021-12-22 NOTE — Telephone Encounter (Signed)
Pt called and states that she needs refills on Toprol 50 mg. She states that she current is taking two of 25 mg and would like the refill to be for one pill at new dose. Please send to [employer] [facility] [employer]. Pt can be reached at [phone].

## 2021-12-24 ENCOUNTER — Other Ambulatory Visit: Payer: Self-pay | Admitting: *Deleted

## 2021-12-24 ENCOUNTER — Ambulatory Visit: Payer: Medicare Other | Admitting: Family Medicine

## 2021-12-24 MED ORDER — METOPROLOL SUCCINATE ER 50 MG PO TB24: 50.0000 mg | ORAL_TABLET | Freq: Every day | ORAL | 0 refills | Status: DC

## 2021-12-24 NOTE — Telephone Encounter (Signed)
Done

## 2021-12-30 NOTE — Progress Notes (Unsigned)
No chief complaint on file.  Patient presents for follow up on blood pressure. Metoprolol dose was increased from 37.5 to '50mg'$  in June due to elevated BP's.  This was originally prescribed by cardiologist for SVT. She just recently had Rx changed to '50mg'$  (she finished her supply of '25mg'$  tablets), got 90d supply recently. She denies any side effects or palpitations. She continues to monitor her sodium, reading labels.  She still adds salt to her salads, but cut back.  BP's are running  Pain? (Shoulder) contributing??  BP Readings from Last 3 Encounters:  10/07/21 130/70  09/11/21 (!) 152/80  08/06/21 130/68      PMH, PSH, SH reviewed   ROS:  no fever, chills, URI symptoms, headaches, dizziness, chest pain, palpitations, shortness of breath. She has chronic runny nose. R shoulder pain Moods are good   PHYSICAL EXAM:  There were no vitals taken for this visit.  Wt Readings from Last 3 Encounters:  10/07/21 122 lb 6.4 oz (55.5 kg)  09/11/21 124 lb 9.6 oz (56.5 kg)  08/06/21 124 lb 6.4 oz (56.4 kg)     Well-appearing female, who appears somewhat younger than her stated age 89: conjunctiva and sclera are clear, EOMI.  Neck: no lymphadenopathy or mass Heart: regular rate and rhythm, no murmur. Frequent ectopy noted. Lungs: clear bilaterally Back: no spinal or CVA tenderness Abdomen: soft, nontender Extremities: no edema Psych: normal mood, affect, hygiene and grooming. Neuro: alert and oriented, normal gait. Cranial nerves intact.   ASSESSMENT/PLAN:   HD flu shot COVID booster and RSV vaccines discussed

## 2021-12-31 ENCOUNTER — Ambulatory Visit (INDEPENDENT_AMBULATORY_CARE_PROVIDER_SITE_OTHER): Payer: Medicare Other | Admitting: Family Medicine

## 2021-12-31 ENCOUNTER — Encounter: Payer: Self-pay | Admitting: Family Medicine

## 2021-12-31 VITALS — BP 120/70 | HR 76 | Ht 58.5 in | Wt 124.2 lb

## 2021-12-31 DIAGNOSIS — J310 Chronic rhinitis: Secondary | ICD-10-CM

## 2021-12-31 DIAGNOSIS — I471 Supraventricular tachycardia, unspecified: Secondary | ICD-10-CM

## 2021-12-31 DIAGNOSIS — G8929 Other chronic pain: Secondary | ICD-10-CM

## 2021-12-31 DIAGNOSIS — Z7185 Encounter for immunization safety counseling: Secondary | ICD-10-CM

## 2021-12-31 DIAGNOSIS — M25511 Pain in right shoulder: Secondary | ICD-10-CM | POA: Diagnosis not present

## 2021-12-31 DIAGNOSIS — I10 Essential (primary) hypertension: Secondary | ICD-10-CM | POA: Diagnosis not present

## 2021-12-31 NOTE — Patient Instructions (Addendum)
I recommend that you get the updated COVID booster and RSV vaccine. These should be separated by 2 weeks from each other and your flu shot. You need to get the RSV vaccine from the pharmacy. You can get the COVID vaccine either at the pharmacy or our office (we should have it within the next 1-2 weeks).   Check your morning blood pressures AFTER you have eaten (giving time for your medication to fully be working). Stop checking blood pressure before taking the metoprolol. Continue to try and limit your sodium. Your evening BP used to be much lower.  Continue to check these periodically (and keep the evening readings in a separate column, so that they are easier to identify).  Continue the ipratroprium as needed for runny nose. If you find yourself having more allergy symptoms (itchy or watery eyes, sneezing, worsening runny nose, postnasal drainage), then you should consider restarting either the zyrtec or Flonase or both.  Do not use the Afrin spray--this isn't for runny nose, but for nasal congestion, and should only be used for 2-3 days at a time. You may use this prior to a plane flight if you have a stuffy nose (to prevent ear pain).

## 2022-01-06 ENCOUNTER — Telehealth: Payer: Self-pay | Admitting: Family Medicine

## 2022-01-06 DIAGNOSIS — H31001 Unspecified chorioretinal scars, right eye: Secondary | ICD-10-CM | POA: Diagnosis not present

## 2022-01-06 DIAGNOSIS — H04123 Dry eye syndrome of bilateral lacrimal glands: Secondary | ICD-10-CM | POA: Diagnosis not present

## 2022-01-06 DIAGNOSIS — H26491 Other secondary cataract, right eye: Secondary | ICD-10-CM | POA: Diagnosis not present

## 2022-01-06 DIAGNOSIS — H524 Presbyopia: Secondary | ICD-10-CM | POA: Diagnosis not present

## 2022-01-06 DIAGNOSIS — H0100B Unspecified blepharitis left eye, upper and lower eyelids: Secondary | ICD-10-CM | POA: Diagnosis not present

## 2022-01-06 DIAGNOSIS — H5211 Myopia, right eye: Secondary | ICD-10-CM | POA: Diagnosis not present

## 2022-01-06 DIAGNOSIS — H1789 Other corneal scars and opacities: Secondary | ICD-10-CM | POA: Diagnosis not present

## 2022-01-06 DIAGNOSIS — H0100A Unspecified blepharitis right eye, upper and lower eyelids: Secondary | ICD-10-CM | POA: Diagnosis not present

## 2022-01-06 DIAGNOSIS — H43813 Vitreous degeneration, bilateral: Secondary | ICD-10-CM | POA: Diagnosis not present

## 2022-01-06 NOTE — Telephone Encounter (Signed)
Pt called and states that for the last few days she has had loose stools. She states this is very unusual for her as she usually is constipated. Pt states she has been taking Benefiber for 2 years due to her issues with constipation. She wants to know if she should stop Benefiber. Pt states she is going on a trip out of the country this weekend and is concerned. Pt was offered an appt with Dr. Tomi Bamberger tomorrow but pt declined stating she wanted me to send a message to Dr. Tomi Bamberger. Pt was advised that Dr. Tomi Bamberger is out of the office today. Pt can be reached at [phone].

## 2022-01-06 NOTE — Telephone Encounter (Signed)
Pt called back and advised

## 2022-01-06 NOTE — Telephone Encounter (Signed)
She can continue benefiber.  Limit cheese/dairy, avoid fried greasy foods, eat a bland diet. She can take some imodium with her, if needed for diarrhea while traveling.

## 2022-01-26 ENCOUNTER — Ambulatory Visit (INDEPENDENT_AMBULATORY_CARE_PROVIDER_SITE_OTHER): Payer: Medicare Other | Admitting: Family Medicine

## 2022-01-26 ENCOUNTER — Encounter: Payer: Self-pay | Admitting: Family Medicine

## 2022-01-26 ENCOUNTER — Ambulatory Visit: Payer: Medicare Other | Admitting: Family Medicine

## 2022-01-26 VITALS — BP 150/80 | HR 68 | Temp 102.5°F | Ht 58.5 in | Wt 121.4 lb

## 2022-01-26 DIAGNOSIS — R1010 Upper abdominal pain, unspecified: Secondary | ICD-10-CM

## 2022-01-26 DIAGNOSIS — D72829 Elevated white blood cell count, unspecified: Secondary | ICD-10-CM

## 2022-01-26 DIAGNOSIS — R509 Fever, unspecified: Secondary | ICD-10-CM | POA: Diagnosis not present

## 2022-01-26 DIAGNOSIS — R3129 Other microscopic hematuria: Secondary | ICD-10-CM

## 2022-01-26 LAB — CBC WITH DIFFERENTIAL/PLATELET
Basophils Absolute: 0 10*3/uL (ref 0.0–0.2)
Basos: 0 %
EOS (ABSOLUTE): 0.1 10*3/uL (ref 0.0–0.4)
Eos: 0 %
Hematocrit: 37.5 % (ref 34.0–46.6)
Hemoglobin: 12.9 g/dL (ref 11.1–15.9)
Lymphocytes Absolute: 1.3 10*3/uL (ref 0.7–3.1)
Lymphs: 6 %
MCH: 30.4 pg (ref 26.6–33.0)
MCHC: 34.4 g/dL (ref 31.5–35.7)
MCV: 88 fL (ref 79–97)
Monocytes Absolute: 2 10*3/uL — ABNORMAL HIGH (ref 0.1–0.9)
Monocytes: 8 %
Neutrophils Absolute: 20.2 10*3/uL — ABNORMAL HIGH (ref 1.4–7.0)
Neutrophils: 86 %
Platelets: 404 10*3/uL (ref 150–450)
RBC: 4.25 x10E6/uL (ref 3.77–5.28)
RDW: 14 % (ref 11.7–15.4)
WBC: 23.4 10*3/uL (ref 3.4–10.8)

## 2022-01-26 LAB — POCT URINALYSIS DIP (PROADVANTAGE DEVICE)
Bilirubin, UA: NEGATIVE
Glucose, UA: NEGATIVE mg/dL
Ketones, POC UA: NEGATIVE mg/dL
Leukocytes, UA: NEGATIVE
Nitrite, UA: NEGATIVE
Specific Gravity, Urine: 1.02
Urobilinogen, Ur: 0.2
pH, UA: 6 (ref 5.0–8.0)

## 2022-01-26 LAB — COMPREHENSIVE METABOLIC PANEL
ALT: 23 IU/L (ref 0–32)
AST: 26 IU/L (ref 0–40)
Albumin/Globulin Ratio: 1.5 (ref 1.2–2.2)
Albumin: 3.5 g/dL — ABNORMAL LOW (ref 3.7–4.7)
Alkaline Phosphatase: 100 IU/L (ref 44–121)
BUN/Creatinine Ratio: 15 (ref 12–28)
BUN: 11 mg/dL (ref 8–27)
Bilirubin Total: 0.9 mg/dL (ref 0.0–1.2)
CO2: 27 mmol/L (ref 20–29)
Calcium: 8.8 mg/dL (ref 8.7–10.3)
Chloride: 98 mmol/L (ref 96–106)
Creatinine, Ser: 0.72 mg/dL (ref 0.57–1.00)
Globulin, Total: 2.3 g/dL (ref 1.5–4.5)
Glucose: 108 mg/dL — ABNORMAL HIGH (ref 70–99)
Potassium: 4.3 mmol/L (ref 3.5–5.2)
Sodium: 135 mmol/L (ref 134–144)
Total Protein: 5.8 g/dL — ABNORMAL LOW (ref 6.0–8.5)
eGFR: 81 mL/min/{1.73_m2} (ref >59)

## 2022-01-26 LAB — POCT INFLUENZA A/B
Influenza A, POC: NEGATIVE
Influenza B, POC: NEGATIVE

## 2022-01-26 LAB — POC COVID19 BINAXNOW: SARS Coronavirus 2 Ag: NEGATIVE

## 2022-01-26 NOTE — Progress Notes (Unsigned)
Chief Complaint  Patient presents with   Hospitalization Follow-up    Follow up from hospital visit while in [place]. Chest pain is gone. Stomach pain is also gone. Was only on right side under breast. Had a soft bowel movement this am. Was taking turmeric curcumin '1500mg'$  was taking before she left for [place] for 2 weeks, wondered if this had something to do with her symptoms. Hands and feet are also blue in color and she is concerned.    10/20, in the evening (before bed, poss after dinner) started with a stomach ache.  By midnight she had chest pressure; couldn't sleep, thought she was having MI, brother took her to the ER at 5am.  She was prescribed a pain medication (paracetamol) and omeprazole '20mg'$ . She brings in copies of ER paperwork, all in [place].  She noted LG fever on 10/22, took paracetamol, and fever resolved. She had some diarrhea that day (she gets this periodically); took 2 imodium  Her pain is all R sided, at one point it was the whole right side.  Other times it was just the RUQ, under the breast, sharp, shooting pains. Her family is worried about her gall bladder.  She hasn't had any pain since 10/26. Pain comes and goes. Starts an hour after eating. She was told not to have tomatoes and other foods, including fatty foods.  Can't tell which foods trigger.  Not taking omeprazole daily, just every other day.  Review of ER records--will need to use google translate later to understand fully, but clearly able to review labs--  EKG--normal (one premature beat) WBC 11.4, otherwise okay  Chem--nonfasting glu 131, rest all normal. Normal CRP, troponin, lipase. Normal PT/PTT  She also has noticed that her hands and feet turn blue. This occurs when she is cold. No associated pain. Denies seeing them red or white, just blue. Once occurred at night--she wore socks and got an extra blanket, and she was fine. Has been feeling chilled.   PMH, PSH, SH reviewed  Outpatient  Encounter Medications as of 01/26/2022  Medication Sig Note   atorvastatin (LIPITOR) 40 MG tablet TAKE 1 TABLET BY MOUTH AT  BEDTIME    Biotin 10000 MCG TABS Take 10,000 mcg by mouth in the morning.    Calcium Carbonate-Vitamin D (CALTRATE 600+D PO) Take 1 tablet by mouth daily.    ipratropium (ATROVENT) 0.06 % nasal spray USE 2 SPRAYS IN EACH NOSTRIL 3 TO 4 TIMES DAILY FOR RUNNY NOSE    metoprolol succinate (TOPROL-XL) 50 MG 24 hr tablet Take 1 tablet (50 mg total) by mouth daily. Take with or immediately following a meal.    Multiple Vitamin (MULTIVITAMIN WITH MINERALS) TABS tablet Take 1 tablet by mouth in the morning. CENTRUM MULTIVITAMIN    Probiotic Product (PROBIOTIC DAILY) CAPS Take 1 capsule by mouth daily with lunch.    Wheat Dextrin (BENEFIBER PO) Take by mouth. 09/11/2021: Twice daily   Acetaminophen (ACETAMIN PO) Take 1,000 mg by mouth as needed. (Patient not taking: Reported on 01/26/2022) 01/26/2022: Given in [place] as needed-has not taken in a few days   cetirizine (ZYRTEC) 10 MG tablet Take 10 mg by mouth daily. (Patient not taking: Reported on 09/11/2021)    docusate sodium (COLACE) 100 MG capsule Take 100 mg by mouth daily as needed for mild constipation. (Patient not taking: Reported on 10/07/2021) 10/07/2021: prn   omeprazole (PRILOSEC) 20 MG capsule Take 20 mg by mouth daily. (Patient not taking: Reported on 01/26/2022) 01/26/2022: Given in  [place] and as not taken for 2 days   Turmeric 400 MG CAPS Take 1 capsule by mouth daily. (Patient not taking: Reported on 01/26/2022)    [DISCONTINUED] MELATONIN PO Take 1 tablet by mouth at bedtime as needed (sleep).    [DISCONTINUED] Omega-3 Fatty Acids (FISH OIL) 1000 MG CAPS Take 1 capsule by mouth daily. (Patient not taking: Reported on 12/31/2021) 12/31/2021: Stopped about a month ago   No facility-administered encounter medications on file as of 01/26/2022.   No Known Allergies  ROS:  no recent fever. She gets some chills, feels cold,  and notices her feet blue. No nausea or vomiting. She had some soft bowel movement this morning, not diarrhea. No urinary complaints. Currently denies abdominal pain.  Chronic runny nose.  No cold symptoms.  PHYSICAL EXAM:  BP (!) 150/80   Pulse 68   Temp (!) 102.5 F (39.2 C) (Tympanic)   Ht 4' 10.5" (1.486 m)   Wt 121 lb 6.4 oz (55.1 kg)   BMI 24.94 kg/m   Wt Readings from Last 3 Encounters:  01/26/22 121 lb 6.4 oz (55.1 kg)  12/31/21 124 lb 3.2 oz (56.3 kg)  10/07/21 122 lb 6.4 oz (55.5 kg)   Pleasant, elderly female, in no distress.  Periodically is shaking--at first thought was a resting tremor (noted when holding a piece of paper), but realized it was more generalized--that's when we took temp and realized she was febrile. HEENT: conjunctiva and sclera are clear, EOMI. OP--Upper R palate appears discolored/slightly raw, and raw along the inner gumline. No swelling.  Rest of OP normal Neck: no lymphadenopathy, thyromegaly or mass Heart: regular rate and rhythm Lungs: clear bilaterally Back: no CVA tenderness Abdomen: soft, normal bowel sounds.  Mildly tender in epigastrium. Nontender at RUQ, negative Murphy.  Rest of abdomen soft, nontender, no mass. Extremities: no edema.  Blue discoloration of toes quickly improved when legs were elevated (when laying down on exam table).  Normal pulses. Neuro: alert and oriented, normal strength, gait Psych: slightly anxious (unchanged), normal affect, hygiene and grooming  ASSESSMENT/PLAN:  Fever, unspecified fever cause - negative flu/COVID (recent travel); recent abd pain (none currently), and elevated WBC, now w/fever.  Stat cbc, imaging if abnl - Plan: CBC with Differential/Platelet, Comprehensive metabolic panel, Influenza A/B, POC COVID-19, POCT Urinalysis DIP (Proadvantage Device), Urine Culture, CT Abdomen Pelvis Wo Contrast  Pain of upper abdomen - currently epigastric.  Reviewed reflux precautions, to use omeprazole daily. Poss  gall bladder--avoid fatty foods. Await labs, likely needs imaging - Plan: CBC with Differential/Platelet, Comprehensive metabolic panel, Influenza A/B, POC COVID-19, POCT Urinalysis DIP (Proadvantage Device), CT Abdomen Pelvis Wo Contrast  Other microscopic hematuria - will send cx.  hx not c/w stone, no sx of UTI. - Plan: Urine Culture  Leukocytosis, unspecified type - Plan: CT Abdomen Pelvis Wo Contrast  Addendum: Lab Results  Component Value Date   WBC 23.4 (HH) 01/26/2022   HGB 12.9 01/26/2022   HCT 37.5 01/26/2022   MCV 88 01/26/2022   PLT 404 01/26/2022   Pt needs stat CT. Results seen after 5pm, unable to get prior auth.  Since pt not in any distress (and fever resolved with meds), can get stat CT for Tuesday. To go to ER if develops increased pain, vomiting, or other worrisome symptoms, rather than waiting for scan.  Pt agrees prefers not to go to ER tonight, feeling okay).    D/w pt at visit (prior to realizing she had fever) I agree with  the recommendations to follow a diet for reflux (avoiding acidic foods, spicy, caffeine, alcohol, etc--see handout), as well as limiting fried/greasy/fatty foods. Keep a food journal to see which foods may be triggering some recurrent pain.  I recommend taking the omeprazole once daily (before a meal) for 2 weeks, and then stopping and seeing if/when your pain recurs.

## 2022-01-27 ENCOUNTER — Observation Stay (HOSPITAL_COMMUNITY): Payer: Medicare Other | Admitting: Anesthesiology

## 2022-01-27 ENCOUNTER — Inpatient Hospital Stay (HOSPITAL_BASED_OUTPATIENT_CLINIC_OR_DEPARTMENT_OTHER)
Admission: RE | Admit: 2022-01-27 | Discharge: 2022-01-27 | Disposition: A | Discharge status: Home / Self Care | Payer: Medicare Other | Source: Ambulatory Visit | Attending: Family Medicine | Admitting: Family Medicine

## 2022-01-27 ENCOUNTER — Encounter (HOSPITAL_COMMUNITY): Payer: Self-pay

## 2022-01-27 ENCOUNTER — Other Ambulatory Visit: Payer: Self-pay

## 2022-01-27 ENCOUNTER — Encounter (HOSPITAL_COMMUNITY): Admission: AD | Disposition: A | Discharge status: Home / Self Care | Payer: Self-pay | Source: Ambulatory Visit

## 2022-01-27 ENCOUNTER — Observation Stay (HOSPITAL_BASED_OUTPATIENT_CLINIC_OR_DEPARTMENT_OTHER): Payer: Medicare Other | Admitting: Anesthesiology

## 2022-01-27 ENCOUNTER — Inpatient Hospital Stay (HOSPITAL_COMMUNITY)
Admission: AD | Admit: 2022-01-27 | Discharge: 2022-01-29 | DRG: 417 | Disposition: A | Discharge status: Home / Self Care | Payer: Medicare Other | Source: Ambulatory Visit | Attending: Surgery | Admitting: Surgery

## 2022-01-27 DIAGNOSIS — I10 Essential (primary) hypertension: Secondary | ICD-10-CM | POA: Diagnosis not present

## 2022-01-27 DIAGNOSIS — R109 Unspecified abdominal pain: Secondary | ICD-10-CM | POA: Diagnosis not present

## 2022-01-27 DIAGNOSIS — K219 Gastro-esophageal reflux disease without esophagitis: Secondary | ICD-10-CM | POA: Diagnosis present

## 2022-01-27 DIAGNOSIS — K750 Abscess of liver: Secondary | ICD-10-CM | POA: Diagnosis not present

## 2022-01-27 DIAGNOSIS — D72829 Elevated white blood cell count, unspecified: Secondary | ICD-10-CM

## 2022-01-27 DIAGNOSIS — R3129 Other microscopic hematuria: Secondary | ICD-10-CM | POA: Diagnosis not present

## 2022-01-27 DIAGNOSIS — K769 Liver disease, unspecified: Secondary | ICD-10-CM | POA: Diagnosis not present

## 2022-01-27 DIAGNOSIS — R509 Fever, unspecified: Secondary | ICD-10-CM | POA: Insufficient documentation

## 2022-01-27 DIAGNOSIS — E7800 Pure hypercholesterolemia, unspecified: Secondary | ICD-10-CM | POA: Diagnosis not present

## 2022-01-27 DIAGNOSIS — K8000 Calculus of gallbladder with acute cholecystitis without obstruction: Secondary | ICD-10-CM | POA: Diagnosis not present

## 2022-01-27 DIAGNOSIS — K82A1 Gangrene of gallbladder in cholecystitis: Secondary | ICD-10-CM | POA: Diagnosis present

## 2022-01-27 DIAGNOSIS — Z803 Family history of malignant neoplasm of breast: Secondary | ICD-10-CM | POA: Diagnosis not present

## 2022-01-27 DIAGNOSIS — K82A2 Perforation of gallbladder in cholecystitis: Secondary | ICD-10-CM | POA: Diagnosis not present

## 2022-01-27 DIAGNOSIS — Z96612 Presence of left artificial shoulder joint: Secondary | ICD-10-CM | POA: Diagnosis present

## 2022-01-27 DIAGNOSIS — Z96641 Presence of right artificial hip joint: Secondary | ICD-10-CM | POA: Diagnosis present

## 2022-01-27 DIAGNOSIS — Z79899 Other long term (current) drug therapy: Secondary | ICD-10-CM | POA: Diagnosis not present

## 2022-01-27 DIAGNOSIS — M1990 Unspecified osteoarthritis, unspecified site: Secondary | ICD-10-CM | POA: Diagnosis not present

## 2022-01-27 DIAGNOSIS — K810 Acute cholecystitis: Secondary | ICD-10-CM

## 2022-01-27 DIAGNOSIS — R1010 Upper abdominal pain, unspecified: Secondary | ICD-10-CM | POA: Insufficient documentation

## 2022-01-27 DIAGNOSIS — Z801 Family history of malignant neoplasm of trachea, bronchus and lung: Secondary | ICD-10-CM | POA: Diagnosis not present

## 2022-01-27 DIAGNOSIS — F419 Anxiety disorder, unspecified: Secondary | ICD-10-CM

## 2022-01-27 DIAGNOSIS — Z83438 Family history of other disorder of lipoprotein metabolism and other lipidemia: Secondary | ICD-10-CM

## 2022-01-27 DIAGNOSIS — Z96653 Presence of artificial knee joint, bilateral: Secondary | ICD-10-CM | POA: Diagnosis not present

## 2022-01-27 DIAGNOSIS — Z85828 Personal history of other malignant neoplasm of skin: Secondary | ICD-10-CM | POA: Diagnosis not present

## 2022-01-27 DIAGNOSIS — Z8249 Family history of ischemic heart disease and other diseases of the circulatory system: Secondary | ICD-10-CM | POA: Diagnosis not present

## 2022-01-27 HISTORY — PX: CHOLECYSTECTOMY: SHX55

## 2022-01-27 LAB — CBC
HCT: 36.2 % (ref 36.0–46.0)
Hemoglobin: 11.9 g/dL — ABNORMAL LOW (ref 12.0–15.0)
MCH: 30.3 pg (ref 26.0–34.0)
MCHC: 32.9 g/dL (ref 30.0–36.0)
MCV: 92.1 fL (ref 80.0–100.0)
Platelets: 364 10*3/uL (ref 150–400)
RBC: 3.93 MIL/uL (ref 3.87–5.11)
RDW: 14.3 % (ref 11.5–15.5)
WBC: 20.1 10*3/uL — ABNORMAL HIGH (ref 4.0–10.5)
nRBC: 0 % (ref 0.0–0.2)

## 2022-01-27 LAB — COMPREHENSIVE METABOLIC PANEL
ALT: 41 U/L (ref 0–44)
AST: 44 U/L — ABNORMAL HIGH (ref 15–41)
Albumin: 2.8 g/dL — ABNORMAL LOW (ref 3.5–5.0)
Alkaline Phosphatase: 118 U/L (ref 38–126)
Anion gap: 9 (ref 5–15)
BUN: 14 mg/dL (ref 8–23)
CO2: 26 mmol/L (ref 22–32)
Calcium: 8.9 mg/dL (ref 8.9–10.3)
Chloride: 102 mmol/L (ref 98–111)
Creatinine, Ser: 0.83 mg/dL (ref 0.44–1.00)
GFR, Estimated: >60 mL/min (ref >60)
Glucose, Bld: 116 mg/dL — ABNORMAL HIGH (ref 70–99)
Potassium: 3.7 mmol/L (ref 3.5–5.1)
Sodium: 137 mmol/L (ref 135–145)
Total Bilirubin: 1.1 mg/dL (ref 0.3–1.2)
Total Protein: 6.3 g/dL — ABNORMAL LOW (ref 6.5–8.1)

## 2022-01-27 SURGERY — LAPAROSCOPIC CHOLECYSTECTOMY WITH INTRAOPERATIVE CHOLANGIOGRAM
Anesthesia: General

## 2022-01-27 MED ORDER — PHENYLEPHRINE 80 MCG/ML (10ML) SYRINGE FOR IV PUSH (FOR BLOOD PRESSURE SUPPORT)
PREFILLED_SYRINGE | INTRAVENOUS | Status: AC
  Filled 2022-01-27: qty 10

## 2022-01-27 MED ORDER — DEXTROSE-NACL 5-0.45 % IV SOLN: INTRAVENOUS | Status: DC

## 2022-01-27 MED ORDER — FENTANYL CITRATE (PF) 100 MCG/2ML IJ SOLN
INTRAMUSCULAR | Status: AC
  Filled 2022-01-27: qty 2

## 2022-01-27 MED ORDER — ROCURONIUM BROMIDE 10 MG/ML (PF) SYRINGE
PREFILLED_SYRINGE | INTRAVENOUS | Status: DC | PRN
  Administered 2022-01-27: 50 mg via INTRAVENOUS
  Administered 2022-01-27: 10 mg via INTRAVENOUS

## 2022-01-27 MED ORDER — MORPHINE SULFATE (PF) 2 MG/ML IV SOLN
1.0000 mg | INTRAVENOUS | Status: DC | PRN
  Filled 2022-01-27: qty 1

## 2022-01-27 MED ORDER — ENOXAPARIN SODIUM 40 MG/0.4ML IJ SOSY
40.0000 mg | PREFILLED_SYRINGE | INTRAMUSCULAR | Status: DC
  Administered 2022-01-28 – 2022-01-29 (×2): 40 mg via SUBCUTANEOUS
  Filled 2022-01-27 (×2): qty 0.4

## 2022-01-27 MED ORDER — FENTANYL CITRATE PF 50 MCG/ML IJ SOSY
PREFILLED_SYRINGE | INTRAMUSCULAR | Status: AC
  Administered 2022-01-27: 25 ug via INTRAVENOUS
  Filled 2022-01-27: qty 1

## 2022-01-27 MED ORDER — SUGAMMADEX SODIUM 200 MG/2ML IV SOLN
INTRAVENOUS | Status: DC | PRN
  Administered 2022-01-27: 200 mg via INTRAVENOUS

## 2022-01-27 MED ORDER — ONDANSETRON HCL 4 MG/2ML IJ SOLN
INTRAMUSCULAR | Status: AC
  Filled 2022-01-27: qty 2

## 2022-01-27 MED ORDER — ACETAMINOPHEN 500 MG PO TABS
1000.0000 mg | ORAL_TABLET | Freq: Four times a day (QID) | ORAL | Status: DC
  Administered 2022-01-28 – 2022-01-29 (×6): 1000 mg via ORAL
  Filled 2022-01-27 (×7): qty 2

## 2022-01-27 MED ORDER — LACTATED RINGERS IV SOLN: INTRAVENOUS | Status: DC

## 2022-01-27 MED ORDER — CHLORHEXIDINE GLUCONATE 0.12 % MT SOLN: 15.0000 mL | Freq: Once | OROMUCOSAL | Status: DC

## 2022-01-27 MED ORDER — PROPOFOL 10 MG/ML IV BOLUS
INTRAVENOUS | Status: AC
  Filled 2022-01-27: qty 20

## 2022-01-27 MED ORDER — ONDANSETRON HCL 4 MG/2ML IJ SOLN: 4.0000 mg | Freq: Once | INTRAMUSCULAR | Status: DC | PRN

## 2022-01-27 MED ORDER — METHOCARBAMOL 500 MG PO TABS
500.0000 mg | ORAL_TABLET | Freq: Three times a day (TID) | ORAL | Status: DC | PRN
  Administered 2022-01-27: 500 mg via ORAL
  Filled 2022-01-27: qty 1

## 2022-01-27 MED ORDER — PROPOFOL 10 MG/ML IV BOLUS
INTRAVENOUS | Status: DC | PRN
  Administered 2022-01-27: 120 mg via INTRAVENOUS

## 2022-01-27 MED ORDER — ORAL CARE MOUTH RINSE: 15.0000 mL | Freq: Once | OROMUCOSAL | Status: DC

## 2022-01-27 MED ORDER — MELATONIN 3 MG PO TABS: 3.0000 mg | ORAL_TABLET | Freq: Every evening | ORAL | Status: DC | PRN

## 2022-01-27 MED ORDER — BUPIVACAINE-EPINEPHRINE 0.5% -1:200000 IJ SOLN
INTRAMUSCULAR | Status: AC
  Filled 2022-01-27: qty 1

## 2022-01-27 MED ORDER — LIDOCAINE 2% (20 MG/ML) 5 ML SYRINGE
INTRAMUSCULAR | Status: DC | PRN
  Administered 2022-01-27: 80 mg via INTRAVENOUS

## 2022-01-27 MED ORDER — ACETAMINOPHEN 10 MG/ML IV SOLN
INTRAVENOUS | Status: DC | PRN
  Administered 2022-01-27: 1000 mg via INTRAVENOUS

## 2022-01-27 MED ORDER — LIDOCAINE HCL (PF) 2 % IJ SOLN
INTRAMUSCULAR | Status: AC
  Filled 2022-01-27: qty 5

## 2022-01-27 MED ORDER — LACTATED RINGERS IV SOLN: INTRAVENOUS | Status: DC | PRN

## 2022-01-27 MED ORDER — PIPERACILLIN-TAZOBACTAM 3.375 G IVPB
INTRAVENOUS | Status: AC
  Filled 2022-01-27: qty 50

## 2022-01-27 MED ORDER — ONDANSETRON 4 MG PO TBDP: 4.0000 mg | ORAL_TABLET | Freq: Four times a day (QID) | ORAL | Status: DC | PRN

## 2022-01-27 MED ORDER — LACTATED RINGERS IR SOLN
Status: DC | PRN
  Administered 2022-01-27: 1000 mL

## 2022-01-27 MED ORDER — PIPERACILLIN-TAZOBACTAM 3.375 G IVPB
3.3750 g | Freq: Three times a day (TID) | INTRAVENOUS | Status: DC
  Administered 2022-01-27 – 2022-01-29 (×6): 3.375 g via INTRAVENOUS
  Filled 2022-01-27 (×5): qty 50

## 2022-01-27 MED ORDER — ONDANSETRON HCL 4 MG/2ML IJ SOLN: 4.0000 mg | Freq: Four times a day (QID) | INTRAMUSCULAR | Status: DC | PRN

## 2022-01-27 MED ORDER — DEXAMETHASONE SODIUM PHOSPHATE 10 MG/ML IJ SOLN
INTRAMUSCULAR | Status: DC | PRN
  Administered 2022-01-27: 5 mg via INTRAVENOUS

## 2022-01-27 MED ORDER — FENTANYL CITRATE PF 50 MCG/ML IJ SOSY
25.0000 ug | PREFILLED_SYRINGE | INTRAMUSCULAR | Status: DC | PRN
  Administered 2022-01-27 (×3): 25 ug via INTRAVENOUS

## 2022-01-27 MED ORDER — MORPHINE SULFATE (PF) 2 MG/ML IV SOLN
1.0000 mg | INTRAVENOUS | Status: DC | PRN
  Administered 2022-01-27 – 2022-01-28 (×2): 2 mg via INTRAVENOUS
  Filled 2022-01-27: qty 1

## 2022-01-27 MED ORDER — PHENYLEPHRINE HCL (PRESSORS) 10 MG/ML IV SOLN
INTRAVENOUS | Status: AC
  Filled 2022-01-27: qty 1

## 2022-01-27 MED ORDER — SIMETHICONE 80 MG PO CHEW: 40.0000 mg | CHEWABLE_TABLET | Freq: Four times a day (QID) | ORAL | Status: DC | PRN

## 2022-01-27 MED ORDER — METHOCARBAMOL 500 MG IVPB - SIMPLE MED: 500.0000 mg | Freq: Three times a day (TID) | INTRAVENOUS | Status: DC | PRN

## 2022-01-27 MED ORDER — DEXAMETHASONE SODIUM PHOSPHATE 10 MG/ML IJ SOLN
INTRAMUSCULAR | Status: AC
  Filled 2022-01-27: qty 1

## 2022-01-27 MED ORDER — ACETAMINOPHEN 10 MG/ML IV SOLN
INTRAVENOUS | Status: AC
  Filled 2022-01-27: qty 100

## 2022-01-27 MED ORDER — 0.9 % SODIUM CHLORIDE (POUR BTL) OPTIME
TOPICAL | Status: DC | PRN
  Administered 2022-01-27: 1000 mL

## 2022-01-27 MED ORDER — METOPROLOL SUCCINATE ER 50 MG PO TB24
50.0000 mg | ORAL_TABLET | Freq: Every day | ORAL | Status: DC
  Administered 2022-01-28 – 2022-01-29 (×2): 50 mg via ORAL
  Filled 2022-01-27 (×2): qty 1

## 2022-01-27 MED ORDER — FENTANYL CITRATE (PF) 100 MCG/2ML IJ SOLN
INTRAMUSCULAR | Status: DC | PRN
  Administered 2022-01-27 (×2): 50 ug via INTRAVENOUS
  Administered 2022-01-27 (×3): 25 ug via INTRAVENOUS

## 2022-01-27 MED ORDER — ONDANSETRON HCL 4 MG/2ML IJ SOLN
INTRAMUSCULAR | Status: DC | PRN
  Administered 2022-01-27: 4 mg via INTRAVENOUS

## 2022-01-27 MED ORDER — PHENYLEPHRINE 80 MCG/ML (10ML) SYRINGE FOR IV PUSH (FOR BLOOD PRESSURE SUPPORT)
PREFILLED_SYRINGE | INTRAVENOUS | Status: DC | PRN
  Administered 2022-01-27: 160 ug via INTRAVENOUS
  Administered 2022-01-27: 120 ug via INTRAVENOUS
  Administered 2022-01-27: 160 ug via INTRAVENOUS

## 2022-01-27 MED ORDER — BUPIVACAINE-EPINEPHRINE (PF) 0.5% -1:200000 IJ SOLN
INTRAMUSCULAR | Status: DC | PRN
  Administered 2022-01-27: 40 mL via PERINEURAL

## 2022-01-27 MED ORDER — SUCCINYLCHOLINE CHLORIDE 200 MG/10ML IV SOSY
PREFILLED_SYRINGE | INTRAVENOUS | Status: DC | PRN
  Administered 2022-01-27: 100 mg via INTRAVENOUS

## 2022-01-27 MED ORDER — OXYCODONE HCL 5 MG PO TABS
5.0000 mg | ORAL_TABLET | ORAL | Status: DC | PRN
  Administered 2022-01-27: 10 mg via ORAL
  Filled 2022-01-27: qty 2

## 2022-01-27 MED ORDER — ESMOLOL HCL 100 MG/10ML IV SOLN
INTRAVENOUS | Status: DC | PRN
  Administered 2022-01-27: 20 mg via INTRAVENOUS

## 2022-01-27 MED ORDER — ROCURONIUM BROMIDE 10 MG/ML (PF) SYRINGE
PREFILLED_SYRINGE | INTRAVENOUS | Status: AC
  Filled 2022-01-27: qty 10

## 2022-01-27 SURGICAL SUPPLY — 50 items
ADH SKN CLS APL DERMABOND .7 (GAUZE/BANDAGES/DRESSINGS) ×1
APL PRP STRL LF DISP 70% ISPRP (MISCELLANEOUS) ×1
APPLIER CLIP ROT 10 11.4 M/L (STAPLE) ×1
APR CLP MED LRG 11.4X10 (STAPLE) ×1
BAG COUNTER SPONGE SURGICOUNT (BAG) IMPLANT
BAG SPEC RTRVL 10 TROC 200 (ENDOMECHANICALS) ×1
BAG SPNG CNTER NS LX DISP (BAG)
CABLE HIGH FREQUENCY MONO STRZ (ELECTRODE) ×2 IMPLANT
CATH URETL OPEN 5X70 (CATHETERS) IMPLANT
CHLORAPREP W/TINT 26 (MISCELLANEOUS) ×2 IMPLANT
CLIP APPLIE ROT 10 11.4 M/L (STAPLE) ×2 IMPLANT
COVER MAYO STAND XLG (MISCELLANEOUS) ×2 IMPLANT
COVER SURGICAL LIGHT HANDLE (MISCELLANEOUS) ×2 IMPLANT
DERMABOND ADVANCED .7 DNX12 (GAUZE/BANDAGES/DRESSINGS) ×2 IMPLANT
DRAPE C-ARM 42X120 X-RAY (DRAPES) IMPLANT
ELECT PENCIL ROCKER SW 15FT (MISCELLANEOUS) IMPLANT
ELECT REM PT RETURN 15FT ADLT (MISCELLANEOUS) ×2 IMPLANT
ENDOLOOP SUT PDS II  0 18 (SUTURE) ×1
ENDOLOOP SUT PDS II 0 18 (SUTURE) ×2 IMPLANT
GLOVE BIO SURGEON STRL SZ7.5 (GLOVE) ×2 IMPLANT
GLOVE BIOGEL PI IND STRL 8 (GLOVE) ×2 IMPLANT
GOWN STRL REUS W/ TWL XL LVL3 (GOWN DISPOSABLE) ×4 IMPLANT
GOWN STRL REUS W/TWL XL LVL3 (GOWN DISPOSABLE) ×2
GRASPER SUT TROCAR 14GX15 (MISCELLANEOUS) IMPLANT
HEMOSTAT SNOW SURGICEL 2X4 (HEMOSTASIS) IMPLANT
IRRIG SUCT STRYKERFLOW 2 WTIP (MISCELLANEOUS) ×2
IRRIGATION SUCT STRKRFLW 2 WTP (MISCELLANEOUS) ×2 IMPLANT
IV CATH 14GX2 1/4 (CATHETERS) ×2 IMPLANT
KIT BASIN OR (CUSTOM PROCEDURE TRAY) ×2 IMPLANT
KIT TURNOVER KIT A (KITS) IMPLANT
L-HOOK LAP DISP 36CM (ELECTROSURGICAL) ×1
LHOOK LAP DISP 36CM (ELECTROSURGICAL) IMPLANT
NDL INSUFFLATION 14GA 120MM (NEEDLE) ×2 IMPLANT
NEEDLE INSUFFLATION 14GA 120MM (NEEDLE) ×1 IMPLANT
PENCIL SMOKE EVACUATOR (MISCELLANEOUS) IMPLANT
POUCH RETRIEVAL ECOSAC 10 (ENDOMECHANICALS) ×2 IMPLANT
POUCH RETRIEVAL ECOSAC 10MM (ENDOMECHANICALS) ×1
SCISSORS LAP 5X35 DISP (ENDOMECHANICALS) ×2 IMPLANT
SET TUBE SMOKE EVAC HIGH FLOW (TUBING) ×2 IMPLANT
SLEEVE Z-THREAD 5X100MM (TROCAR) ×4 IMPLANT
SPIKE FLUID TRANSFER (MISCELLANEOUS) ×2 IMPLANT
STOPCOCK 4 WAY LG BORE MALE ST (IV SETS) IMPLANT
SUT ETHILON 2 0 PS N (SUTURE) IMPLANT
SUT MNCRL AB 4-0 PS2 18 (SUTURE) ×2 IMPLANT
TOWEL OR 17X26 10 PK STRL BLUE (TOWEL DISPOSABLE) ×2 IMPLANT
TOWEL OR NON WOVEN STRL DISP B (DISPOSABLE) IMPLANT
TRAP SPECIMEN MUCUS 40CC (MISCELLANEOUS) IMPLANT
TRAY LAPAROSCOPIC (CUSTOM PROCEDURE TRAY) ×2 IMPLANT
TROCAR ADV FIXATION 12X100MM (TROCAR) ×2 IMPLANT
TROCAR Z-THREAD OPTICAL 5X100M (TROCAR) ×2 IMPLANT

## 2022-01-27 NOTE — Op Note (Signed)
Patient: Carri Blazevich Teem ([birth year], [id number])  Date of Surgery: 01/27/2022   Preoperative Diagnosis: Gangrenous Cholecystitis   Postoperative Diagnosis: Gangernous Cholecystitis   Surgical Procedure: LAPAROSCOPIC CHOLECYSTECTOMY: 78295 (CPT)   Operative Team Members:  Surgeon(s) and Role:    * Znya Albino, Hyman Hopes, MD - Primary   Anesthesiologist: Shelton Silvas, MD; Collene Schlichter, MD CRNA: Epimenio Sarin, CRNA; Orest Dikes, CRNA   Anesthesia: General   Fluids:  Total I/O In: 1950 [I.V.:1900; IV Piggyback:50] Out: 150 [Blood:150]  Complications: none  Drains:  (19 Fr) Jackson-Pratt drain(s) with closed bulb suction in the gallbladder fossa    Specimen:  ID Type Source Tests Collected by Time Destination  1 : gallbladder Tissue PATH Other SURGICAL PATHOLOGY Adraine Biffle, Hyman Hopes, MD 01/27/2022 1509   A : liver abcess culture Tissue PATH Other AEROBIC/ANAEROBIC CULTURE W GRAM STAIN (SURGICAL/DEEP WOUND) Dorma Altman, Hyman Hopes, MD 01/27/2022 1404      Disposition:  PACU - hemodynamically stable.  Plan of Care: Admit to inpatient     Indications for Procedure: HADIJA WAAG is a 88 y.o. female who presented with abdominal pain.  History, physical and imaging was concerning for gangrenous cholecystitis.  Laparoscopic cholecystectomy was recommended for the patient.  The procedure itself, as well as the risks, benefits and alternatives were discussed with the patient.  Risks discussed included but were not limited to the risk of infection, bleeding, damage to nearby structures, need to convert to open procedure, incisional hernia, bile leak, common bile duct injury and the need for additional procedures or surgeries.  With this discussion complete and all questions answered the patient granted consent to proceed.  Findings: Gangrenous cholecystitis with liver abscess  Infection status: Patient: [facility] Patient Case:  Urgent Infection Present At Time Of Surgery (PATOS): Liver abscess and purulent drainage from gangrenous cholecystitis   Description of Procedure:   On the date stated above, the patient was taken to the operating room suite and placed in supine positioning.  Sequential compression devices were placed on the lower extremities to prevent blood clots.  General endotracheal anesthesia was induced. Preoperative antibiotics (zosyn) were given within 30 minutes of incision.  The patient's abdomen was prepped and draped in the usual sterile fashion.  A time-out was completed verifying the correct patient, procedure, positioning and equipment needed for the case.  We began by anesthetizing the skin with local anesthetic and then making a 5 mm incision just below the umbilicus.  We dissected through the subcutaneous tissues to the fascia.  The fascia was grasped and elevated using a Kocher clamp.  A Veress needle was inserted into the abdomen and the abdomen was insufflated to 15 mmHg.  A 5 mm trocar was inserted in this position under optical guidance and then the abdomen was inspected.  There was no trauma to the underlying viscera with initial trocar placement.  Any abnormal findings, other than inflammation in the right upper quadrant, are listed above in the findings section.  Three additional trocars were placed, one 12 mm trocar in the subxiphoid position, one 5 mm trocar in the right mid abdomen, and one 5mm trocar in the right upper quadrant subcostally.  These were placed under direct vision without any trauma to the underlying viscera.    The patient was then placed in head up, left side down positioning.    The gallbladder was encased in inflamed omentum.  It was very difficult to free the gallbladder from  the surrounding inflammation.  I worked from the fundus down toward the infundibulum.  The infundibulum was identified and retracted laterally.  The infundibulum curled back on itself making it  difficult to dissect out the cystic duct.  The cystic artery was clamped and divided and a second posterior cystic artery was encountered later in the case.  I was able to control this.  The infundibulum of the gallbladder was divided and a Endoloop was placed over the cystic duct and the posterior artery.  This was cinched down with good hemostasis and good ligation of the cystic duct.  A second Endoloop was placed around the area to secure the cystic duct.  The gallbladder was dissected off the cystic plate.  There was a perforation of the gallbladder with erosion of the abscess into the liver near the fundus of the gallbladder.  The gallbladder was removed and placed in Endo Catch bag and passed off the field as a specimen.  I then spent a lot of time irrigating the abdomen and suctioning innumerable small gallstones.  I was able to use the larger suction tip connected directly to the suction canister and clamping the suction tube to control the suction with some success to clear a lot of these gallstones, however there were are inevitably some that are likely left behind.  There was good hemostasis.  A 19 [place] JP drain was brought through the right abdominal port and placed in the gallbladder fossa.    The 12 mm port site was closed with a simple interrupted and figure-of-eight 0 Vicryl suture at the fascial level using a PMI suture passer.  The skin was closed with 4-0 Monocryl and Dermabond.  A drain sponge was placed over the drain.  All sponge needle counts were correct at the end of this case.    Ivar Drape, MD General, Bariatric, & Minimally Invasive Surgery [facility], [place]

## 2022-01-27 NOTE — Patient Instructions (Signed)
  I agree with the recommendations to follow a diet for reflux (avoiding acidic foods, spicy, caffeine, alcohol, etc--see handout), as well as limiting fried/greasy/fatty foods. Keep a food journal to see which foods may be triggering some recurrent pain.  I recommend taking the omeprazole once daily (before a meal) for 2 weeks, and then stopping and seeing if/when your pain recurs.

## 2022-01-27 NOTE — Anesthesia Preprocedure Evaluation (Addendum)
Anesthesia Evaluation  Patient identified by MRN, date of birth, ID band Patient awake    Reviewed: Allergy & Precautions, NPO status , Patient's Chart, lab work & pertinent test results, reviewed documented beta blocker date and time   Airway Mallampati: II  TM Distance: >3 FB Neck ROM: Full    Dental  (+) Teeth Intact, Dental Advisory Given, Chipped,    Pulmonary neg pulmonary ROS,    Pulmonary exam normal breath sounds clear to auscultation       Cardiovascular hypertension, Pt. on home beta blockers (-) angina(-) Past MI Normal cardiovascular exam Rhythm:Regular Rate:Normal     Neuro/Psych  Headaches, PSYCHIATRIC DISORDERS Anxiety    GI/Hepatic GERD  Medicated,Dead gallbladder    Endo/Other  negative endocrine ROS  Renal/GU negative Renal ROS     Musculoskeletal  (+) Arthritis ,   Abdominal   Peds  Hematology negative hematology ROS (+)   Anesthesia Other Findings Day of surgery medications reviewed with the patient.  Reproductive/Obstetrics                            Anesthesia Physical Anesthesia Plan  ASA: 3 and emergent  Anesthesia Plan: General   Post-op Pain Management: Ofirmev IV (intra-op)* and Toradol IV (intra-op)*   Induction: Intravenous, Rapid sequence and Cricoid pressure planned  PONV Risk Score and Plan: 4 or greater and Dexamethasone, Ondansetron and Treatment may vary due to age or medical condition  Airway Management Planned: Oral ETT  Additional Equipment:   Intra-op Plan:   Post-operative Plan: Extubation in OR  Informed Consent: I have reviewed the patients History and Physical, chart, labs and discussed the procedure including the risks, benefits and alternatives for the proposed anesthesia with the patient or authorized representative who has indicated his/her understanding and acceptance.     Dental advisory given  Plan Discussed with:  CRNA  Anesthesia Plan Comments: (2nd PIV, possible arterial line after induction.)        Anesthesia Quick Evaluation

## 2022-01-27 NOTE — Transfer of Care (Signed)
Immediate Anesthesia Transfer of Care Note  Patient: Alexis Hines Janeway  Procedure(s) Performed: LAPAROSCOPIC CHOLECYSTECTOMY  Patient Location: PACU  Anesthesia Type:General  Level of Consciousness: drowsy and patient cooperative  Airway & Oxygen Therapy: Patient Spontanous Breathing and Patient connected to face mask oxygen  Post-op Assessment: Report given to RN and Post -op Vital signs reviewed and stable  Post vital signs: Reviewed and stable  Last Vitals:  Vitals Value Taken Time  BP 140/84 01/27/22 1550  Temp    Pulse 78 01/27/22 1555  Resp 26 01/27/22 1555  SpO2 100 % 01/27/22 1555  Vitals shown include unvalidated device data.  Last Pain:  Vitals:   01/27/22 1250  TempSrc:   PainSc: 0-No pain         Complications: No notable events documented.

## 2022-01-27 NOTE — Anesthesia Procedure Notes (Signed)
Procedure Name: Intubation Date/Time: 01/27/2022 1:23 PM  Performed by: Maxwell Caul, CRNAPre-anesthesia Checklist: Patient identified, Emergency Drugs available, Suction available and Patient being monitored Patient Re-evaluated:Patient Re-evaluated prior to induction Oxygen Delivery Method: Circle system utilized Preoxygenation: Pre-oxygenation with 100% oxygen Induction Type: IV induction and Rapid sequence Laryngoscope Size: Mac and 3 Grade View: Grade I Tube type: Oral Tube size: 7.0 mm Number of attempts: 1 Airway Equipment and Method: Stylet Placement Confirmation: ETT inserted through vocal cords under direct vision, positive ETCO2 and breath sounds checked- equal and bilateral Secured at: 21 cm Tube secured with: Tape Dental Injury: Teeth and Oropharynx as per pre-operative assessment

## 2022-01-27 NOTE — H&P (Signed)
Alexis Hines [facility] Feb 07, 1934  [id number].    Chief Complaint/Reason for Consult: gangrenous cholecystitis  HPI:  This is a healthy 88 yo white female with a history of HTN and HLD who was recently in [place] and developed epigastric abdominal pain radiating to her chest with no other associated symptoms.  She went to the ED in [place] to rule out an MI.  They diagnosed her with GERD and gave her Tylenol and Omeprazole.  She has continued to not feel great but continued her trip.  She denies any associated symptoms with eating.  No nausea or vomiting.  She did have some diarrhea and actually took imodium for this a couple of days ago.  She returned to the [place] and home a couple of days ago.  She saw her PCP yesterday with a temp of 102.5 and a WBC of 23.4.  she was sent for a CT scan which got done today and revealed gangrenous, emphysematous cholecystitis.  She has been referred to [place] for surgical intervention.  ROS: ROS: see HPI, all other systems are negative.  Family History  Problem Relation Age of Onset   Cancer Sister        bone cancer L leg with mets to lung, diagnosed age 39; s/p amputation   Hyperlipidemia Brother    Heart disease Brother 61       CABG   Cancer Sister 47       breast cancer and lung cancer (lung dx'd first)   Rheum arthritis Daughter    Gout Son     Past Medical History:  Diagnosis Date   Abnormal TSH 2004   normal since   Arthritis    Basal cell cancer    nose; Dr. Allyson Sabal    Diverticulosis 10/2005   Dizziness    Migraine childhood   resolved   PAC (premature atrial contraction)    frequent on cardiac monitor 09/14/19   Pure hypercholesterolemia    Radial styloid tenosynovitis (de quervain) 11/2018   injected by Dr. Fredna Dow 11/2018, 02/2019   SCCA (squamous cell carcinoma) of skin 12/2015   right temple   Squamous cell carcinoma of skin 02/15/2017   in situ-left jaw (CX35FU)    Past Surgical History:  Procedure Laterality Date   BREAST BIOPSY  Left 1984   benign   EYE SURGERY     Bil and eyelids lifted   left hip intra-articular steriod injection  03/13/2014   Mohs procedure     REVERSE SHOULDER ARTHROPLASTY Left 12/26/2020   Procedure: REVERSE SHOULDER ARTHROPLASTY;  Surgeon: Justice Britain, MD;  Location: [facility];  Service: Orthopedics;  Laterality: Left;  180mn   TONSILLECTOMY  age 88  TOTAL HIP ARTHROPLASTY Right 10/10/2019   Procedure: TOTAL HIP ARTHROPLASTY ANTERIOR APPROACH;  Surgeon: OParalee Cancel MD;  Location: [facility];  Service: Orthopedics;  Laterality: Right;  70 mins   TOTAL KNEE ARTHROPLASTY Left 07/2003   Dr. CTheda Sers  TOTAL KNEE ARTHROPLASTY Right 09/03/2017   Procedure: RIGHT TOTAL KNEE ARTHROPLASTY;  Surgeon: CSydnee Cabal MD;  Location: [facility];  Service: Orthopedics;  Laterality: Right;    Social History:  reports that she has never smoked. She has never used smokeless tobacco. She reports current alcohol use. She reports that she does not use drugs.  Allergies: No Known Allergies  Medications Prior to Admission  Medication Sig Dispense Refill   Acetaminophen (ACETAMIN PO) Take 1,000 mg by mouth as needed.     atorvastatin (LIPITOR)  40 MG tablet TAKE 1 TABLET BY MOUTH AT  BEDTIME 100 tablet 1   Calcium Carbonate-Vitamin D (CALTRATE 600+D PO) Take 1 tablet by mouth daily.     ipratropium (ATROVENT) 0.06 % nasal spray USE 2 SPRAYS IN EACH NOSTRIL 3 TO 4 TIMES DAILY FOR RUNNY NOSE 15 mL 2   metoprolol succinate (TOPROL-XL) 50 MG 24 hr tablet Take 1 tablet (50 mg total) by mouth daily. Take with or immediately following a meal. 90 tablet 0   Multiple Vitamin (MULTIVITAMIN WITH MINERALS) TABS tablet Take 1 tablet by mouth in the morning. CENTRUM MULTIVITAMIN     Probiotic Product (PROBIOTIC DAILY) CAPS Take 1 capsule by mouth daily with lunch.     Wheat Dextrin (BENEFIBER PO) Take by mouth.     Biotin 10000 MCG TABS Take 10,000 mcg by mouth in the morning.     cetirizine (ZYRTEC) 10 MG tablet Take 10 mg by mouth  daily. (Patient not taking: Reported on 09/11/2021)     docusate sodium (COLACE) 100 MG capsule Take 100 mg by mouth daily as needed for mild constipation. (Patient not taking: Reported on 10/07/2021)     omeprazole (PRILOSEC) 20 MG capsule Take 20 mg by mouth daily. (Patient not taking: Reported on 01/26/2022)     Turmeric 400 MG CAPS Take 1 capsule by mouth daily. (Patient not taking: Reported on 01/26/2022)       Physical Exam: Blood pressure (!) 121/55, pulse 87, temperature 98 F (36.7 C), temperature source Oral, resp. rate 18, height _0  (1.473 m), weight 54.9 kg, SpO2 97 %. General: pleasant, WD, WN, elderly white female who is laying in bed in NAD HEENT: head is normocephalic, atraumatic.  Sclera are noninjected.  PERRL.  Ears and nose without any masses or lesions.  Mouth is pink and moist Heart: regular, rate, and rhythm.  Normal s1,s2. No obvious murmurs, gallops, or rubs noted.  Palpable radial and pedal pulses bilaterally Lungs: CTAB, no wheezes, rhonchi, or rales noted.  Respiratory effort nonlabored Abd: soft, mildly tender in RUQ with a mild +Murphy's sign, ND, +BS, no masses, hernias, or organomegaly MS: all 4 extremities are symmetrical with no cyanosis, clubbing, or edema. Skin: warm and dry with no masses, lesions, or rashes Neuro: Cranial nerves 2-12 grossly intact, sensation is normal throughout Psych: A&Ox3 with an appropriate affect.   Results for orders placed or performed in visit on 01/26/22 (from the past 48 hour(s))  CBC with Differential/Platelet     Status: Abnormal   Collection Time: 01/26/22 12:02 PM  Result Value Ref Range   WBC 23.4 (HH) 3.4 - 10.8 x10E3/uL    Comment: **Result Repeated**   RBC 4.25 3.77 - 5.28 x10E6/uL   Hemoglobin 12.9 11.1 - 15.9 g/dL   Hematocrit 37.5 34.0 - 46.6 %   MCV 88 79 - 97 fL   MCH 30.4 26.6 - 33.0 pg   MCHC 34.4 31.5 - 35.7 g/dL   RDW 14.0 11.7 - 15.4 %   Platelets 404 150 - 450 x10E3/uL   Neutrophils 86 Not Estab.  %   Lymphs 6 Not Estab. %   Monocytes 8 Not Estab. %   Eos 0 Not Estab. %   Basos 0 Not Estab. %   Neutrophils Absolute 20.2 (H) 1.4 - 7.0 x10E3/uL   Lymphocytes Absolute 1.3 0.7 - 3.1 x10E3/uL   Monocytes Absolute 2.0 (H) 0.1 - 0.9 x10E3/uL   EOS (ABSOLUTE) 0.1 0.0 - 0.4 x10E3/uL   Basophils Absolute 0.0  0.0 - 0.2 x10E3/uL  Comprehensive metabolic panel     Status: Abnormal   Collection Time: 01/26/22 12:02 PM  Result Value Ref Range   Glucose 108 (H) 70 - 99 mg/dL   BUN 11 8 - 27 mg/dL   Creatinine, Ser 0.72 0.57 - 1.00 mg/dL   eGFR 81 >59 mL/min/1.73   BUN/Creatinine Ratio 15 12 - 28   Sodium 135 134 - 144 mmol/L   Potassium 4.3 3.5 - 5.2 mmol/L   Chloride 98 96 - 106 mmol/L   CO2 27 20 - 29 mmol/L   Calcium 8.8 8.7 - 10.3 mg/dL   Total Protein 5.8 (L) 6.0 - 8.5 g/dL   Albumin 3.5 (L) 3.7 - 4.7 g/dL   Globulin, Total 2.3 1.5 - 4.5 g/dL   Albumin/Globulin Ratio 1.5 1.2 - 2.2   Bilirubin Total 0.9 0.0 - 1.2 mg/dL   Alkaline Phosphatase 100 44 - 121 IU/L   AST 26 0 - 40 IU/L   ALT 23 0 - 32 IU/L  POC COVID-19     Status: None   Collection Time: 01/26/22 12:21 PM  Result Value Ref Range   SARS Coronavirus 2 Ag Negative Negative  Influenza A/B     Status: None   Collection Time: 01/26/22 12:21 PM  Result Value Ref Range   Influenza A, POC Negative Negative   Influenza B, POC Negative Negative  POCT Urinalysis DIP (Proadvantage Device)     Status: Abnormal   Collection Time: 01/26/22  3:34 PM  Result Value Ref Range   Color, UA yellow yellow   Clarity, UA clear clear   Glucose, UA negative negative mg/dL   Bilirubin, UA negative negative   Ketones, POC UA negative negative mg/dL   Specific Gravity, Urine 1.020    Blood, UA moderate (A) negative   pH, UA 6.0 5.0 - 8.0   Protein Ur, POC trace (A) negative mg/dL   Urobilinogen, Ur 0.2    Nitrite, UA Negative Negative   Leukocytes, UA Negative Negative   CT Abdomen Pelvis Wo Contrast  Result Date:  01/27/2022 CLINICAL DATA:  Epigastric pain.  Fever. EXAM: CT ABDOMEN AND PELVIS WITHOUT CONTRAST TECHNIQUE: Multidetector CT imaging of the abdomen and pelvis was performed following the standard protocol without IV contrast. RADIATION DOSE REDUCTION: This exam was performed according to the departmental dose-optimization program which includes automated exposure control, adjustment of the mA and/or kV according to patient size and/or use of iterative reconstruction technique. COMPARISON:  None Available. FINDINGS: Lower chest: Bibasilar atelectasis.  Enlarged cardiac contours. Hepatobiliary: Liver has a normal contour. There is mild periportal edema. No definite evidence of intrahepatic biliary ductal dilatation. Lack of IV contrast markedly limits the ability to assess for focal liver lesions. The gallbladder is markedly enlarged with multiple layering gallstones. There is also evidence of pericholecystic fat stranding (series 5, image 42) there is marked gallbladder wall thickening with air within the gallbladder wall. There also appears to be a defect in the wall of the gallbladder (series 2, image 35). Findings are worrisome for emphysematous and possibly gangrenous cholecystitis. Common bile duct is normal in size measuring up to 6 mm. Pancreas: Unremarkable. No pancreatic ductal dilatation or surrounding inflammatory changes. Spleen: Normal in size without focal abnormality. Adrenals/Urinary Tract: Adrenal glands are unremarkable. Kidneys are normal, without renal calculi, focal lesion, or hydronephrosis. Bladder is unremarkable. Stomach/Bowel: Stomach, small bowel, large bowel are normal in caliber. There is no evidence of bowel obstruction. There is no evidence of  focal wall thickening. Vascular/Lymphatic: Severe atherosclerotic plaque is seen in the abdominal and pelvic vasculature. Reproductive: Uterus and bilateral adnexa are unremarkable. Other: No abdominal wall hernia or abnormality. No  abdominopelvic ascites. Musculoskeletal: No acute or significant osseous findings. Right hip arthroplasty. IMPRESSION: Findings are worrisome for emphysematous and gangrenous cholecystitis. Recommend surgical consultation. Findings were discussed with Dr. Redmond School on 01/27/22 at 11:00 AM. Electronically Signed   By: Marin Roberts M.D.   On: 01/27/2022 11:05      Assessment/Plan Gangrenous cholecystitis The patient has been seen, examined, chart, labs pending but yesterday's reviewe, vitals, and imaging personally reviewed.  The patient has evidence of significant cholecystitis and almost certainly gangrenous given air in the gallbladder wall.  We will plan to proceed with lap chole possible IOC pending the need today emergently given this infection appears to be starting to invade the liver.  She has been started on Zosyn.  She will almost certainly require a drain post-operatively.  This has been discussed with the patient along with other risks and complications.  I have explained the procedure, risks, and aftercare of cholecystectomy.  Risks include but are not limited to bleeding, infection, wound problems, anesthesia, diarrhea, bile leak, injury to common bile duct/liver/intestine.  She is also aware of partial/fennestrated cholecystectomy.  Anesthesia risks of MI, CVA, or death.  She seems to understand and agrees to proceed.   FEN - NPO/IVFs VTE - lovenox to start post op tomorrow ID - [email or web address] Admit - inpatient  HTN - resume metoprolol HLD - lipitor on hold  I reviewed last 24 h vitals and pain scores, last 48 h intake and output, last 24 h labs and trends, and last 24 h imaging results.  Henreitta Cea, [facility] 01/27/2022, 1:05 PM Please see Amion for pager number during day hours 7:00am-4:30pm or 7:00am -11:30am on weekends

## 2022-01-28 ENCOUNTER — Encounter (HOSPITAL_COMMUNITY): Payer: Self-pay | Admitting: Surgery

## 2022-01-28 LAB — COMPREHENSIVE METABOLIC PANEL
ALT: 50 U/L — ABNORMAL HIGH (ref 0–44)
AST: 62 U/L — ABNORMAL HIGH (ref 15–41)
Albumin: 2.3 g/dL — ABNORMAL LOW (ref 3.5–5.0)
Alkaline Phosphatase: 90 U/L (ref 38–126)
Anion gap: 7 (ref 5–15)
BUN: 10 mg/dL (ref 8–23)
CO2: 24 mmol/L (ref 22–32)
Calcium: 8 mg/dL — ABNORMAL LOW (ref 8.9–10.3)
Chloride: 104 mmol/L (ref 98–111)
Creatinine, Ser: 0.77 mg/dL (ref 0.44–1.00)
GFR, Estimated: >60 mL/min (ref >60)
Glucose, Bld: 225 mg/dL — ABNORMAL HIGH (ref 70–99)
Potassium: 4 mmol/L (ref 3.5–5.1)
Sodium: 135 mmol/L (ref 135–145)
Total Bilirubin: 0.6 mg/dL (ref 0.3–1.2)
Total Protein: 5.4 g/dL — ABNORMAL LOW (ref 6.5–8.1)

## 2022-01-28 LAB — CBC
HCT: 32.8 % — ABNORMAL LOW (ref 36.0–46.0)
Hemoglobin: 10.8 g/dL — ABNORMAL LOW (ref 12.0–15.0)
MCH: 30.3 pg (ref 26.0–34.0)
MCHC: 32.9 g/dL (ref 30.0–36.0)
MCV: 91.9 fL (ref 80.0–100.0)
Platelets: 341 10*3/uL (ref 150–400)
RBC: 3.57 MIL/uL — ABNORMAL LOW (ref 3.87–5.11)
RDW: 14.5 % (ref 11.5–15.5)
WBC: 14 10*3/uL — ABNORMAL HIGH (ref 4.0–10.5)
nRBC: 0 % (ref 0.0–0.2)

## 2022-01-28 MED ORDER — ATORVASTATIN CALCIUM 20 MG PO TABS
40.0000 mg | ORAL_TABLET | Freq: Every day | ORAL | Status: DC
  Administered 2022-01-28: 40 mg via ORAL
  Filled 2022-01-28: qty 2

## 2022-01-28 NOTE — Progress Notes (Signed)
Progress Note: General Surgery Service   Chief Complaint/Subjective: Doing well POD1  Objective: Vital signs in last 24 hours: Temp:  [97.4 F (36.3 C)-98.4 F (36.9 C)] 97.4 F (36.3 C) (11/01 1017) Pulse Rate:  [60-150] 62 (11/01 0610) Resp:  [13-20] 18 (11/01 0610) BP: (93-146)/(45-84) 108/56 (11/01 0610) SpO2:  [90 %-100 %] 92 % (11/01 0610) Weight:  [54.9 kg] 54.9 kg (10/31 1250)    Intake/Output from previous day: 10/31 0701 - 11/01 0700 In: 3306.2 [P.O.:480; I.V.:2726.2; IV Piggyback:100] Out: 745 [Urine:500; Drains:95; Blood:150] Intake/Output this shift: No intake/output data recorded.  GI: Abd JP Drain serosanguinous.  Incisions c/d/I w/ glue  Lab Results: CBC  Recent Labs    01/27/22 1306 01/28/22 0428  WBC 20.1* 14.0*  HGB 11.9* 10.8*  HCT 36.2 32.8*  PLT 364 341   BMET Recent Labs    01/27/22 1306 01/28/22 0428  NA 137 135  K 3.7 4.0  CL 102 104  CO2 26 24  GLUCOSE 116* 225*  BUN 14 10  CREATININE 0.83 0.77  CALCIUM 8.9 8.0*   PT/INR No results for input(s): "LABPROT", "INR" in the last 72 hours. ABG No results for input(s): "PHART", "HCO3" in the last 72 hours.  Invalid input(s): "PCO2", "PO2"  Anti-infectives: Anti-infectives (From admission, onward)    Start     Dose/Rate Route Frequency Ordered Stop   01/27/22 1236  piperacillin-tazobactam (ZOSYN) 3.375 GM/50ML IVPB       Note to Pharmacy: Otila Back M: cabinet override      01/27/22 1236 01/27/22 1337   01/27/22 1230  piperacillin-tazobactam (ZOSYN) IVPB 3.375 g        3.375 g 12.5 mL/hr over 240 Minutes Intravenous Every 8 hours 01/27/22 1222 02/03/22 1359       Medications: Scheduled Meds:  acetaminophen  1,000 mg Oral Q6H   enoxaparin (LOVENOX) injection  40 mg Subcutaneous Q24H   metoprolol succinate  50 mg Oral Daily   Continuous Infusions:  dextrose 5 % and 0.45% NaCl 100 mL/hr at 01/28/22 0441   methocarbamol (ROBAXIN) IV     piperacillin-tazobactam  (ZOSYN)  IV 3.375 g (01/28/22 0504)   PRN Meds:.melatonin, methocarbamol **OR** methocarbamol (ROBAXIN) IV, morphine injection, ondansetron **OR** ondansetron (ZOFRAN) IV, oxyCODONE, simethicone  Assessment/Plan: s/p Procedure(s): LAPAROSCOPIC CHOLECYSTECTOMY 01/27/2022 Gangrenous cholecystitis  Doing well POD1 Drain serosanguinous Vitals stable overnight Advance diet as tolerated PT/OT/TOC Prolonged course of antibiotics due to spillage of stones and liver abscess Stop IVF    LOS: 0 days   FEN: ADAT ID: Zosyn VTE: Lovenox Foley: None Dispo: Continued care on floor today, possible discharge home tomorrow    Felicie Morn, [facility] Use [email or web address] to contact on call provider  Daily Billing: [phone] - post op

## 2022-01-28 NOTE — Anesthesia Postprocedure Evaluation (Signed)
Anesthesia Post Note  Patient: Alexis Hines  Procedure(s) Performed: LAPAROSCOPIC CHOLECYSTECTOMY     Patient location during evaluation: PACU Anesthesia Type: General Level of consciousness: awake and alert Pain management: pain level controlled Vital Signs Assessment: post-procedure vital signs reviewed and stable Respiratory status: spontaneous breathing, nonlabored ventilation, respiratory function stable and patient connected to nasal cannula oxygen Cardiovascular status: blood pressure returned to baseline and stable Postop Assessment: no apparent nausea or vomiting Anesthetic complications: no   No notable events documented.  Last Vitals:  Vitals:   01/28/22 0610 01/28/22 1038  BP: (!) 108/56 (!) 105/53  Pulse: 62 70  Resp: 18 16  Temp:  (!) 36.3 C  SpO2: 92% 96%    Last Pain:  Vitals:   01/28/22 1100  TempSrc:   PainSc: 2                  Effie Berkshire

## 2022-01-28 NOTE — Progress Notes (Signed)
Mobility Specialist - Progress Note   01/28/22 1512  Mobility  Activity Ambulated with assistance in hallway  Level of Assistance Modified independent, requires aide device or extra time  Assistive Device  (IV Pole)  Distance Ambulated (ft) 500 ft  Activity Response Tolerated well  Mobility Referral Yes  $Mobility charge 1 Mobility   Pt received in recliner and agreeable to mobility. No complaints during mobility session. Pt to bed after session with all needs met.      [facility]

## 2022-01-28 NOTE — Progress Notes (Signed)
Mobility Specialist - Progress Note    01/28/22 1640  Mobility  Activity Ambulated with assistance in hallway  Level of Assistance Modified independent, requires aide device or extra time  Assistive Device  (IV Pole)  Distance Ambulated (ft) 275 ft  Range of Motion/Exercises Active  Activity Response Tolerated well  Mobility Referral Yes  $Mobility charge 1 Mobility   Pt received in bed and agreeable to mobility. C/o pain in the lower part of the abdomen. Pt to bed after session with all needs met.     [facility]

## 2022-01-28 NOTE — Evaluation (Signed)
Physical Therapy Evaluation Patient Details Name: Alexis Hines MRN: [id number] DOB: Nov 18, 1933 Today's Date: 01/28/2022  History of Present Illness  Patient is 88 y.o. female who presented with abdominal pain to [facility] and is now s/p lap colecystectomy on 01/27/22 with findings of gangrenous cholecystitis with liver abscess. PMH significant for OA, dizziness, migraine, PAC, Bil TKA and Rt THA.   Clinical Impression  Alexis Hines is 88 y.o. female admitted with above HPI and diagnosis. Patient is currently limited by functional impairments below (see PT problem list). Patient lives alone and is independent at baseline. Currently she requires min guard for safety with transfers and gait. Patient will benefit from continued skilled PT interventions to address impairments and progress independence with mobility, anticipate no follow up needed when medically ready to discharge. Acute PT will follow and progress as able.        Recommendations for follow up therapy are one component of a multi-disciplinary discharge planning process, led by the attending physician.  Recommendations may be updated based on patient status, additional functional criteria and insurance authorization.  Follow Up Recommendations No PT follow up      Assistance Recommended at Discharge Intermittent Supervision/Assistance  Patient can return home with the following  A little help with walking and/or transfers;A little help with bathing/dressing/bathroom;Assistance with cooking/housework;Assist for transportation;Help with stairs or ramp for entrance    Equipment Recommendations None recommended by PT  Recommendations for Other Services       Functional Status Assessment Patient has had a recent decline in their functional status and demonstrates the ability to make significant improvements in function in a reasonable and predictable amount of time.     Precautions / Restrictions Precautions Precautions:  Fall Restrictions Weight Bearing Restrictions: No      Mobility  Bed Mobility Overal bed mobility: Needs Assistance Bed Mobility: Supine to Sit     Supine to sit: Supervision, HOB elevated     General bed mobility comments: pt taking extra time    Transfers Overall transfer level: Needs assistance Equipment used: None Transfers: Sit to/from Stand Sit to Stand: Min guard           General transfer comment: guarding for safety. pt using bil UE's for power up.    Ambulation/Gait Ambulation/Gait assistance: Min assist Gait Distance (Feet): 230 Feet Assistive device: 1 person hand held assist, IV Pole, None Gait Pattern/deviations: Step-through pattern, Decreased stride length (decreased arm swing) Gait velocity: decr     General Gait Details: cues for safe management of IV pole at start. pt required min assist to steady intermittently. progressed to guardign with IV pole. Multimodal cues needed for arm swing to improve balance. step length more symmetric with assistance to facilitate arm swing.  Stairs            Wheelchair Mobility    Modified Rankin (Stroke Patients Only)       Balance Overall balance assessment: Needs assistance Sitting-balance support: Feet supported Sitting balance-Leahy Scale: Good     Standing balance support: During functional activity Standing balance-Leahy Scale: Fair                               Pertinent Vitals/Pain      Home Living Family/patient expects to be discharged to:: Private residence Living Arrangements: Alone   Type of Home: House (condo townhome) Home Access: Stairs to enter Entrance Stairs-Rails: None Entrance Stairs-Number of Steps: 1  Home Layout: One level Home Equipment: Conservation officer, nature (2 wheels);Cane - single point;BSC/3in1 Additional Comments: pt just got back from [place], went to ED in [place] and nothing was wrong with her heart. She returned home on Oct 26th and came in for same  pains. Pt typically and still plans to go to [facility] in winter months.    Prior Function Prior Level of Function : Independent/Modified Independent             Mobility Comments: pt exercises 4x/wk at [employer] ADLs Comments: independent     Hand Dominance   Dominant Hand: Right    Extremity/Trunk Assessment   Upper Extremity Assessment Upper Extremity Assessment: Overall WFL for tasks assessed    Lower Extremity Assessment Lower Extremity Assessment: Overall WFL for tasks assessed    Cervical / Trunk Assessment Cervical / Trunk Assessment: Normal  Communication   Communication: No difficulties  Cognition Arousal/Alertness: Awake/alert Behavior During Therapy: WFL for tasks assessed/performed Overall Cognitive Status: Within Functional Limits for tasks assessed                                          General Comments      Exercises     Assessment/Plan    PT Assessment Patient needs continued PT services  PT Problem List Decreased strength;Decreased range of motion;Decreased activity tolerance;Decreased balance;Decreased mobility;Decreased knowledge of use of DME;Decreased safety awareness;Decreased knowledge of precautions       PT Treatment Interventions DME instruction;Gait training;Stair training;Functional mobility training;Therapeutic activities;Therapeutic exercise;Balance training;Patient/family education    PT Goals (Current goals can be found in the Care Plan section)  Acute Rehab PT Goals Patient Stated Goal: get back home PT Goal Formulation: With patient Time For Goal Achievement: 02/11/22 Potential to Achieve Goals: Good    Frequency Min 3X/week     Co-evaluation               AM-PAC PT "6 Clicks" Mobility  Outcome Measure Help needed turning from your back to your side while in a flat bed without using bedrails?: A Little Help needed moving from lying on your back to sitting on the side of a flat bed without using  bedrails?: A Little Help needed moving to and from a bed to a chair (including a wheelchair)?: A Little Help needed standing up from a chair using your arms (e.g., wheelchair or bedside chair)?: A Little Help needed to walk in hospital room?: A Little Help needed climbing 3-5 steps with a railing? : A Little 6 Click Score: 18    End of Session Equipment Utilized During Treatment: Gait belt Activity Tolerance: Patient tolerated treatment well Patient left: in chair;with call bell/phone within reach;with chair alarm set Nurse Communication: Mobility status PT Visit Diagnosis: Muscle weakness (generalized) (M62.81);Difficulty in walking, not elsewhere classified (R26.2);Unsteadiness on feet (R26.81)    Time: 0932-3557 PT Time Calculation (min) (ACUTE ONLY): 25 min   Charges:   PT Evaluation $PT Eval Low Complexity: 1 Low PT Treatments $Gait Training: 8-22 mins        Verner Mould, DPT Acute Rehabilitation Services Office [phone]  01/28/22 1:13 PM

## 2022-01-29 ENCOUNTER — Telehealth: Payer: Self-pay | Admitting: *Deleted

## 2022-01-29 DIAGNOSIS — Z803 Family history of malignant neoplasm of breast: Secondary | ICD-10-CM | POA: Diagnosis not present

## 2022-01-29 DIAGNOSIS — I10 Essential (primary) hypertension: Secondary | ICD-10-CM | POA: Diagnosis present

## 2022-01-29 DIAGNOSIS — Z85828 Personal history of other malignant neoplasm of skin: Secondary | ICD-10-CM | POA: Diagnosis not present

## 2022-01-29 DIAGNOSIS — K750 Abscess of liver: Secondary | ICD-10-CM | POA: Diagnosis present

## 2022-01-29 DIAGNOSIS — K810 Acute cholecystitis: Secondary | ICD-10-CM | POA: Diagnosis present

## 2022-01-29 DIAGNOSIS — Z79899 Other long term (current) drug therapy: Secondary | ICD-10-CM | POA: Diagnosis not present

## 2022-01-29 DIAGNOSIS — Z83438 Family history of other disorder of lipoprotein metabolism and other lipidemia: Secondary | ICD-10-CM | POA: Diagnosis not present

## 2022-01-29 DIAGNOSIS — K219 Gastro-esophageal reflux disease without esophagitis: Secondary | ICD-10-CM | POA: Diagnosis present

## 2022-01-29 DIAGNOSIS — Z801 Family history of malignant neoplasm of trachea, bronchus and lung: Secondary | ICD-10-CM | POA: Diagnosis not present

## 2022-01-29 DIAGNOSIS — Z96653 Presence of artificial knee joint, bilateral: Secondary | ICD-10-CM | POA: Diagnosis present

## 2022-01-29 DIAGNOSIS — Z8249 Family history of ischemic heart disease and other diseases of the circulatory system: Secondary | ICD-10-CM | POA: Diagnosis not present

## 2022-01-29 DIAGNOSIS — K8000 Calculus of gallbladder with acute cholecystitis without obstruction: Secondary | ICD-10-CM | POA: Diagnosis present

## 2022-01-29 DIAGNOSIS — K82A2 Perforation of gallbladder in cholecystitis: Secondary | ICD-10-CM | POA: Diagnosis present

## 2022-01-29 DIAGNOSIS — E7800 Pure hypercholesterolemia, unspecified: Secondary | ICD-10-CM | POA: Diagnosis present

## 2022-01-29 DIAGNOSIS — Z96641 Presence of right artificial hip joint: Secondary | ICD-10-CM | POA: Diagnosis present

## 2022-01-29 DIAGNOSIS — K82A1 Gangrene of gallbladder in cholecystitis: Secondary | ICD-10-CM | POA: Diagnosis present

## 2022-01-29 DIAGNOSIS — Z96612 Presence of left artificial shoulder joint: Secondary | ICD-10-CM | POA: Diagnosis present

## 2022-01-29 DIAGNOSIS — F419 Anxiety disorder, unspecified: Secondary | ICD-10-CM | POA: Diagnosis present

## 2022-01-29 LAB — SURGICAL PATHOLOGY

## 2022-01-29 MED ORDER — OXYCODONE HCL 5 MG PO TABS: 5.0000 mg | ORAL_TABLET | ORAL | 0 refills | Status: DC | PRN

## 2022-01-29 MED ORDER — ACETAMINOPHEN 500 MG PO TABS: 1000.0000 mg | ORAL_TABLET | Freq: Four times a day (QID) | ORAL | 0 refills | Status: DC | PRN

## 2022-01-29 MED ORDER — AMOXICILLIN-POT CLAVULANATE 875-125 MG PO TABS: 1.0000 | ORAL_TABLET | Freq: Two times a day (BID) | ORAL | 0 refills | Status: DC

## 2022-01-29 NOTE — Plan of Care (Signed)
Patient is stable for discharge. Discharge instructions given, patient understood instructions. All questions answered. Patient is being discharged home with daughter.

## 2022-01-29 NOTE — Discharge Summary (Signed)
Patient ID: Alexis Hines [id number] 1933-04-20 88 y.o.  Admit date: 01/27/2022 Discharge date: 01/29/2022  Admitting Diagnosis: Acute gangrenous cholecystitis HTN  Discharge Diagnosis Patient Active Problem List   Diagnosis Date Noted   Acute cholecystitis 01/27/2022   S/P reverse total shoulder arthroplasty, left 12/26/2020   Paroxysmal SVT (supraventricular tachycardia) 12/14/2020   Chronic rhinitis 12/14/2020   Osteoarthritis of both shoulders 12/14/2020   Overweight (BMI 25.0-29.9) 10/11/2019   Osteoarthritis of right hip 10/10/2019   Status post right hip replacement 10/10/2019   Primary osteoarthritis of right knee 09/03/2017   S/P knee replacement 09/03/2017   Generalized anxiety disorder 02/04/2017   Precordial pain 10/26/2013   Hip arthritis 12/29/2012   Allergic rhinitis, cause unspecified 08/26/2011   Heme + stool 12/23/2010   Pure hypercholesterolemia 11/26/2010  S/p lap chole with JP drain placement  Consultants none  Reason for Admission: This is a healthy 88 yo white female with a history of HTN and HLD who was recently in [place] and developed epigastric abdominal pain radiating to her chest with no other associated symptoms.  She went to the ED in [place] to rule out an MI.  They diagnosed her with GERD and gave her Tylenol and Omeprazole.  She has continued to not feel great but continued her trip.  She denies any associated symptoms with eating.  No nausea or vomiting.  She did have some diarrhea and actually took imodium for this a couple of days ago.  She returned to the [place] and home a couple of days ago.  She saw her PCP yesterday with a temp of 102.5 and a WBC of 23.4.  she was sent for a CT scan which got done today and revealed gangrenous, emphysematous cholecystitis.  She has been referred to [place] for surgical intervention.   Procedures Lap chole with JP drain placement by Dr. Thermon Leyland, 10/31  Hospital Course:  The patient was admitted and  underwent a laparoscopic cholecystectomy for gangrenous cholecystitis with JP drain placement.  Intraoperative cultures were obtained that are still pending but are showing gram negative rods as expected.  The patient tolerated the procedure well.  On POD 2, the patient was tolerating a regular diet, voiding well, mobilizing, and pain was controlled with oral pain medications.  The patient was stable for DC home at this time with appropriate follow up made.  She was continued on zosyn while here and will Dc on 8 more days of augmentin at home along with her JP drain.  All other medical problems remained stable throughout her stay.   Physical Exam: Abd: soft, appropriately tender, JP drain in place with minimal serosang output, all incisions are c/d/i  Allergies as of 01/29/2022   No Known Allergies      Medication List     STOP taking these medications    amoxicillin 500 MG capsule Commonly known as: AMOXIL       TAKE these medications    acetaminophen 500 MG tablet Commonly known as: TYLENOL Take 2 tablets (1,000 mg total) by mouth every 6 (six) hours as needed. What changed:  how much to take reasons to take this   Advil 200 MG Caps Generic drug: Ibuprofen Take 200 mg by mouth every 6 (six) hours as needed (for headaches).   amoxicillin-clavulanate 875-125 MG tablet Commonly known as: AUGMENTIN Take 1 tablet by mouth 2 (two) times daily for 8 days.   atorvastatin 40 MG tablet Commonly known as: LIPITOR TAKE 1 TABLET BY  MOUTH AT  BEDTIME   BENEFIBER PO Take 8-10 g by mouth See admin instructions. Mix 8-10 grams (2 teaspoonsful) of powder into applesauce or the suggested amount of warm water and consume 2 times a day   Biotin 10000 MCG Tabs Take 10,000 mcg by mouth in the morning.   CALTRATE 600+D PO Take 1 tablet by mouth daily.   ipratropium 0.06 % nasal spray Commonly known as: ATROVENT USE 2 SPRAYS IN EACH NOSTRIL 3 TO 4 TIMES DAILY FOR RUNNY NOSE What  changed:  how much to take how to take this when to take this additional instructions   metoprolol succinate 50 MG 24 hr tablet Commonly known as: TOPROL-XL Take 1 tablet (50 mg total) by mouth daily. Take with or immediately following a meal.   multivitamin with minerals Tabs tablet Take 1 tablet by mouth daily with breakfast. CENTRUM MULTIVITAMIN   omeprazole 20 MG capsule Commonly known as: PRILOSEC Take 20 mg by mouth daily before breakfast.   oxyCODONE 5 MG immediate release tablet Commonly known as: Oxy IR/ROXICODONE Take 1 tablet (5 mg total) by mouth every 4 (four) hours as needed for moderate pain.   PreviDent 5000 Booster Plus 1.1 % Pste Generic drug: Sodium Fluoride Place 1 application  onto teeth every 3 (three) days.   PROBIOTIC PO Take 1 capsule by mouth daily.          Follow-up Information     [facility] [place] Follow up on 02/05/2022.   Specialty: General Surgery Why: This is a nurse only visit for drain removal.  10:30am, Arrive 30 minutes prior to your appointment time, Please bring your insurance card and photo ID Contact information: [address]   [address] [phone]         Lucita Ferrara, PA-C Follow up on 02/17/2022.   Specialty: General Surgery Why: 11:00am, arrive 15 minutes early for check in Contact information: [place] [address] [phone]                 Signed: Saverio Danker, [facility] 01/29/2022, 9:28 AM Please see Amion for pager number during day hours 7:00am-4:30pm, 7-11:30am on Weekends

## 2022-01-29 NOTE — Progress Notes (Signed)
Mobility Specialist - Progress Note   01/29/22 1120  Mobility  Activity Ambulated with assistance in hallway  Level of Assistance Modified independent, requires aide device or extra time  Assistive Device Front wheel walker  Distance Ambulated (ft) 300 ft  Range of Motion/Exercises Active  Activity Response Tolerated well  Mobility Referral Yes  $Mobility charge 1 Mobility   Pt received in recliner and agreeable to mobility. No complaints during mobility. Pt to recliner after session with all needs met.     [facility]

## 2022-01-29 NOTE — Telephone Encounter (Signed)
Patient called from hospital. She is doing ok. Having some pain. She has not yet had a bowel movement but thinks she will soon. Had some prune juice with her breakfast this morning. She normally takes Benefiber daily, the hospital does not have this-they have Metamucil. She wanted to check with you first and make sure you were okay with her taking this.

## 2022-01-29 NOTE — Evaluation (Signed)
Occupational Therapy Evaluation Patient Details Name: Alexis Hines MRN: [id number] DOB: 1933/12/03 Today's Date: 01/29/2022   History of Present Illness Patient is 88 y.o. female who presented with abdominal pain to [facility] and is now s/p lap colecystectomy on 01/27/22 with findings of gangrenous cholecystitis with liver abscess. PMH significant for OA, dizziness, migraine, PAC, Bil TKA and Rt THA.   Clinical Impression   The patient required min guard assist to min assist for sit to stand, toileting at bathroom level, and lower body dressing. She denied having pain at rest, however indicated having 8/10 abdominal pain with activity. She was further noted to be with mild deconditioning & slight generalized strength deficits. She will benefit from OT services during her hospital stay to maximize her safety and independence with self-care tasks, and to facilitate her safe return home.      Recommendations for follow up therapy are one component of a multi-disciplinary discharge planning process, led by the attending physician.  Recommendations may be updated based on patient status, additional functional criteria and insurance authorization.   Follow Up Recommendations  No OT follow up    Assistance Recommended at Discharge PRN  Patient can return home with the following Assist for transportation;Assistance with cooking/housework    Functional Status Assessment  Patient has had a recent decline in their functional status and demonstrates the ability to make significant improvements in function in a reasonable and predictable amount of time.  Equipment Recommendations  None recommended by OT       Precautions / Restrictions Restrictions Weight Bearing Restrictions: No Other Position/Activity Restrictions: abdominal      Mobility Bed Mobility Overal bed mobility: Needs Assistance Bed Mobility: Supine to Sit     Supine to sit: Supervision, HOB elevated           Transfers Overall transfer level: Needs assistance Equipment used: None Transfers: Sit to/from Stand Sit to Stand: Min guard             Balance     Sitting balance-Leahy Scale: Good         Standing balance comment: min guard assist           ADL either performed or assessed with clinical judgement   ADL Overall ADL's : Needs assistance/impaired Eating/Feeding: Independent Eating/Feeding Details (indicate cue type and reason): based on clinical judgement Grooming: Supervision/safety;Standing Grooming Details (indicate cue type and reason): She performed teeth brushing and hand washing at sink level.         Upper Body Dressing : Set up;Sitting Upper Body Dressing Details (indicate cue type and reason): She donned a hospital gown at chair level. Lower Body Dressing: Minimal assistance Lower Body Dressing Details (indicate cue type and reason): She was instructed on implementing the figure 4 technique for lower body dressing, given her abdominal precautions. She required assistance for donning her underwear up to her ankles, then she stood to donn them over her hips. Toilet Transfer: Min guard;Ambulation Toilet Transfer Details (indicate cue type and reason): 1 verbal cue provided for grab bar use as needed Toileting- Clothing Manipulation and Hygiene: Minimal assistance;Sit to/from stand Toileting - Clothing Manipulation Details (indicate cue type and reason): She performed hygiene in standing after a bowel movement, however required assist for thoroughness with task. She further required min assist for clothing management.             Vision Patient Visual Report: No change from baseline Additional Comments: She correctly read the time depicted on the  wall clock            Pertinent Vitals/Pain Pain Assessment Pain Assessment: No/denies pain Pain Location: Denies pain at rest, however reported 8/10 abdominal pain with activity Pain Intervention(s):  Limited activity within patient's tolerance     Hand Dominance Right   Extremity/Trunk Assessment Upper Extremity Assessment Upper Extremity Assessment: Overall WFL for tasks assessed   Lower Extremity Assessment Lower Extremity Assessment: Overall WFL for tasks assessed       Communication Communication Communication: No difficulties   Cognition Arousal/Alertness: Awake/alert Behavior During Therapy: WFL for tasks assessed/performed Overall Cognitive Status: Within Functional Limits for tasks assessed          General Comments: Oriented x4, able to follow commands                Home Living Family/patient expects to be discharged to:: Private residence Living Arrangements: Alone Available Help at Discharge: Family;Available PRN/intermittently Type of Home: House Home Access: Stairs to enter Entrance Stairs-Number of Steps: 1 Entrance Stairs-Rails: None Home Layout: One level     Bathroom Shower/Tub: Tub/shower unit         Home Equipment: Conservation officer, nature (2 wheels);Cane - single point;Shower seat          Prior Functioning/Environment Prior Level of Function : Independent/Modified Independent             Mobility Comments: Independent with ambulation ADLs Comments: independent with ADLs, cooking, cleaning, and driving        OT Problem List: Decreased strength;Impaired balance (sitting and/or standing);Decreased knowledge of precautions;Pain      OT Treatment/Interventions: Self-care/ADL training;Therapeutic exercise;Energy conservation;DME and/or AE instruction;Balance training;Patient/family education;Therapeutic activities    OT Goals(Current goals can be found in the care plan section) Acute Rehab OT Goals OT Goal Formulation: With patient Time For Goal Achievement: 02/12/22 Potential to Achieve Goals: Good ADL Goals Pt Will Perform Grooming: with modified independence;standing Pt Will Perform Lower Body Dressing: with modified  independence;sit to/from stand Pt Will Transfer to Toilet: with modified independence;ambulating Pt Will Perform Toileting - Clothing Manipulation and hygiene: with modified independence;sit to/from stand  OT Frequency: Min 2X/week       AM-PAC OT "6 Clicks" Daily Activity     Outcome Measure Help from another person eating meals?: None Help from another person taking care of personal grooming?: None Help from another person toileting, which includes using toliet, bedpan, or urinal?: A Little Help from another person bathing (including washing, rinsing, drying)?: A Little Help from another person to put on and taking off regular upper body clothing?: None Help from another person to put on and taking off regular lower body clothing?: A Little 6 Click Score: 21   End of Session Nurse Communication: Mobility status  Activity Tolerance: Patient tolerated treatment well Patient left: in chair;with call bell/phone within reach  OT Visit Diagnosis: Unsteadiness on feet (R26.81);Pain                Time: 2979-8921 OT Time Calculation (min): 52 min Charges:  OT Evaluation $OT Eval Low Complexity: 1 Low OT Treatments $Self Care/Home Management : 23-37 mins   Leota Sauers, OTR/L  01/29/2022, 11:10 AM

## 2022-01-29 NOTE — Telephone Encounter (Signed)
Patient called back and had a bowel movement so she thinks she may not need to take anything, it was almost diarrhea (runny like). Doctor in hospital said she does not need to take omeprazole anymore because her gallbladder is out and she does not have reflux anymore. She will be avoiding greasy foods.

## 2022-01-29 NOTE — Progress Notes (Signed)
  Transition of Care [facility]) Screening Note   Patient Details  Name: SAMAIYA AWADALLAH Date of Birth: 05/11/1933   Transition of Care [facility]) CM/SW Contact:    Lennart Pall, LCSW Phone Number: 01/29/2022, 10:54 AM    Transition of Care Department [facility]) has reviewed patient and no TOC needs have been identified at this time. We will continue to monitor patient advancement through interdisciplinary progression rounds. If new patient transition needs arise, please place a TOC consult.

## 2022-01-29 NOTE — Discharge Instructions (Signed)
[facility]  Please arrive at least 30 min before your appointment to complete your check in paperwork.  If you are unable to arrive 30 min prior to your appointment time we may have to cancel or reschedule you. LAPAROSCOPIC SURGERY: POST OP INSTRUCTIONS Always review your discharge instruction sheet given to you by the facility where your surgery was performed. IF YOU HAVE DISABILITY OR FAMILY LEAVE FORMS, YOU MUST BRING THEM TO THE OFFICE FOR PROCESSING.   DO NOT GIVE THEM TO YOUR DOCTOR.  PAIN CONTROL  First take acetaminophen (Tylenol) AND/or ibuprofen (Advil) to control your pain after surgery.  Follow directions on package.  Taking acetaminophen (Tylenol) and/or ibuprofen (Advil) regularly after surgery will help to control your pain and lower the amount of prescription pain medication you may need.  You should not take more than 4,000 mg (4 grams) of acetaminophen (Tylenol) in 24 hours.  You should not take ibuprofen (Advil), aleve, motrin, naprosyn or other NSAIDS if you have a history of stomach ulcers or chronic kidney disease.  A prescription for pain medication may be given to you upon discharge.  Take your pain medication as prescribed, if you still have uncontrolled pain after taking acetaminophen (Tylenol) or ibuprofen (Advil). Use ice packs to help control pain. If you need a refill on your pain medication, please contact your pharmacy.  They will contact our office to request authorization. Prescriptions will not be filled after 5pm or on week-ends.  HOME MEDICATIONS Take your usually prescribed medications unless otherwise directed.  DIET You should follow a light diet the first few days after arrival home.  Be sure to include lots of fluids daily. Avoid fatty, fried foods.   CONSTIPATION It is common to experience some constipation after surgery and if you are taking pain medication.  Increasing fluid intake and taking a stool softener (such as Colace)  will usually help or prevent this problem from occurring.  A mild laxative (Milk of Magnesia or Miralax) should be taken according to package instructions if there are no bowel movements after 48 hours.  WOUND/INCISION CARE Most patients will experience some swelling and bruising in the area of the incisions.  Ice packs will help.  Swelling and bruising can take several days to resolve.  Unless discharge instructions indicate otherwise, follow guidelines below  STERI-STRIPS - you may remove your outer bandages 48 hours after surgery, and you may shower at that time.  You have steri-strips (small skin tapes) in place directly over the incision.  These strips should be left on the skin for 7-10 days.   DERMABOND/SKIN GLUE - you may shower in 24 hours.  The glue will flake off over the next 2-3 weeks. Any sutures or staples will be removed at the office during your follow-up visit.  ACTIVITIES You may resume regular (light) daily activities beginning the next day--such as daily self-care, walking, climbing stairs--gradually increasing activities as tolerated.  You may have sexual intercourse when it is comfortable.  Refrain from any heavy lifting or straining until approved by your doctor. You may drive when you are no longer taking prescription pain medication, you can comfortably wear a seatbelt, and you can safely maneuver your car and apply brakes.  FOLLOW-UP You should see your doctor in the office for a follow-up appointment approximately 2-3 weeks after your surgery.  You should have been given your post-op/follow-up appointment when your surgery was scheduled.  If you did not receive a post-op/follow-up appointment, make sure   that you call for this appointment within a day or two after you arrive home to insure a convenient appointment time.   WHEN TO CALL YOUR DOCTOR: Fever over 101.0 Inability to urinate Continued bleeding from incision. Increased pain, redness, or drainage from the  incision. Increasing abdominal pain  The clinic staff is available to answer your questions during regular business hours.  Please don't hesitate to call and ask to speak to one of the nurses for clinical concerns.  If you have a medical emergency, go to the nearest emergency room or call 911.  A surgeon from [facility] is always on call at the hospital. [address], [address], [place], [place]  [address] ? [address] [address], [place], [place]   [address] [phone] ? [phone] ? [phone] [phone]  

## 2022-01-30 ENCOUNTER — Telehealth: Payer: Self-pay

## 2022-01-30 LAB — URINE CULTURE

## 2022-01-30 NOTE — Telephone Encounter (Signed)
Transition Care Management Follow-up Telephone Call Date of discharge and from where: [facility] 01/29/22 How have you been since you were released from the hospital? Still has some pain which is expected Any questions or concerns? No  Items Reviewed: Did the pt receive and understand the discharge instructions provided? Yes  Medications obtained and verified? Yes  Other? No  Any new allergies since your discharge? No  Dietary orders reviewed? Yes Do you have support at home? Yes   Home Care and Equipment/Supplies: Were home health services ordered? no  Follow up appointments reviewed:  PCP Hospital f/u appt confirmed? No  no f/u needed with PCP.  [facility] f/u appt confirmed? Yes  Scheduled to see [place] [facility]  on 02/05/22 @ 10:30. Are transportation arrangements needed? No  If their condition worsens, is the pt aware to call PCP or go to the Emergency Dept.? Yes Was the patient provided with contact information for the PCP's office or ED? Yes Was to pt encouraged to call back with questions or concerns? Yes

## 2022-02-01 LAB — AEROBIC/ANAEROBIC CULTURE W GRAM STAIN (SURGICAL/DEEP WOUND)

## 2022-02-02 ENCOUNTER — Telehealth: Payer: Self-pay | Admitting: *Deleted

## 2022-02-02 NOTE — Telephone Encounter (Signed)
Patient called and wanted you to know that she is a lot of pain even though she is taking oxycodone. She called [facility] yesterday and the on call nurse told her to call back today-she is waiting on call back from them. She also has a tube attached to her stomach and there is a bottle that she squeezes once it's full-she thinks she needs a new one, she also told them about that. Her legs are really swollen, she states she is eating like a bird but has gained 5lbs which she assumes is fluid-will this go away? She said omeprazole was ordered but called the pharmacy and it's not there so she took one of the ones she had from [place]. She told me in the phone call from 11/2 that she was told that she no longer had to take omeprazole-seems confused.

## 2022-02-02 NOTE — Telephone Encounter (Signed)
Hopefully she will get her questions answered by the surgeon's office. If she has questions and concerns (swelling, meds), she should schedule a follow-up visit.

## 2022-02-02 NOTE — Telephone Encounter (Signed)
Patient advised. She asked me to ask you to call in omeprazole for her. I advised her to please schedule a follow up. She asked if I would ask you anyway.  [facility] has not called her back.

## 2022-02-02 NOTE — Telephone Encounter (Signed)
Discharge summary reviewed Omeprazole was on her med list prior to admission, wasn't stopped.  But it wasn't ordered, no prescription sent. I doubt that she needs this, as she hasn't had a history of reflux, and her gall bladder was likely the source of the discomfort she was having at the time (when in [place]).  She doesn't need this right now (and if she starts having heartburn or pain in the central upper stomach, it is available over the counter as Prilosec OTC).  Needs OV for ongoing concerns/issues

## 2022-02-03 ENCOUNTER — Encounter (HOSPITAL_COMMUNITY): Payer: Self-pay

## 2022-02-03 ENCOUNTER — Emergency Department (HOSPITAL_COMMUNITY): Payer: Medicare Other

## 2022-02-03 ENCOUNTER — Inpatient Hospital Stay (HOSPITAL_COMMUNITY)
Admission: EM | Admit: 2022-02-03 | Discharge: 2022-02-11 | DRG: 862 | Disposition: A | Discharge status: Home Health | Payer: Medicare Other | Attending: Internal Medicine | Admitting: Internal Medicine

## 2022-02-03 ENCOUNTER — Other Ambulatory Visit: Payer: Self-pay

## 2022-02-03 DIAGNOSIS — I5032 Chronic diastolic (congestive) heart failure: Secondary | ICD-10-CM | POA: Diagnosis not present

## 2022-02-03 DIAGNOSIS — K838 Other specified diseases of biliary tract: Secondary | ICD-10-CM | POA: Diagnosis not present

## 2022-02-03 DIAGNOSIS — J9811 Atelectasis: Secondary | ICD-10-CM | POA: Diagnosis not present

## 2022-02-03 DIAGNOSIS — I517 Cardiomegaly: Secondary | ICD-10-CM | POA: Diagnosis not present

## 2022-02-03 DIAGNOSIS — Z79899 Other long term (current) drug therapy: Secondary | ICD-10-CM

## 2022-02-03 DIAGNOSIS — Z8249 Family history of ischemic heart disease and other diseases of the circulatory system: Secondary | ICD-10-CM

## 2022-02-03 DIAGNOSIS — D649 Anemia, unspecified: Secondary | ICD-10-CM | POA: Diagnosis not present

## 2022-02-03 DIAGNOSIS — I110 Hypertensive heart disease with heart failure: Secondary | ICD-10-CM | POA: Diagnosis present

## 2022-02-03 DIAGNOSIS — Y836 Removal of other organ (partial) (total) as the cause of abnormal reaction of the patient, or of later complication, without mention of misadventure at the time of the procedure: Secondary | ICD-10-CM | POA: Diagnosis present

## 2022-02-03 DIAGNOSIS — T8143XA Infection following a procedure, organ and space surgical site, initial encounter: Principal | ICD-10-CM

## 2022-02-03 DIAGNOSIS — Z9049 Acquired absence of other specified parts of digestive tract: Secondary | ICD-10-CM

## 2022-02-03 DIAGNOSIS — Z6826 Body mass index (BMI) 26.0-26.9, adult: Secondary | ICD-10-CM | POA: Diagnosis not present

## 2022-02-03 DIAGNOSIS — B9620 Unspecified Escherichia coli [E. coli] as the cause of diseases classified elsewhere: Secondary | ICD-10-CM | POA: Diagnosis present

## 2022-02-03 DIAGNOSIS — Z8619 Personal history of other infectious and parasitic diseases: Secondary | ICD-10-CM

## 2022-02-03 DIAGNOSIS — M1990 Unspecified osteoarthritis, unspecified site: Secondary | ICD-10-CM | POA: Diagnosis not present

## 2022-02-03 DIAGNOSIS — Z83438 Family history of other disorder of lipoprotein metabolism and other lipidemia: Secondary | ICD-10-CM

## 2022-02-03 DIAGNOSIS — K810 Acute cholecystitis: Secondary | ICD-10-CM | POA: Diagnosis not present

## 2022-02-03 DIAGNOSIS — L02211 Cutaneous abscess of abdominal wall: Secondary | ICD-10-CM | POA: Diagnosis not present

## 2022-02-03 DIAGNOSIS — E8809 Other disorders of plasma-protein metabolism, not elsewhere classified: Secondary | ICD-10-CM | POA: Diagnosis not present

## 2022-02-03 DIAGNOSIS — Z96653 Presence of artificial knee joint, bilateral: Secondary | ICD-10-CM | POA: Diagnosis present

## 2022-02-03 DIAGNOSIS — N281 Cyst of kidney, acquired: Secondary | ICD-10-CM | POA: Diagnosis not present

## 2022-02-03 DIAGNOSIS — I471 Supraventricular tachycardia, unspecified: Secondary | ICD-10-CM | POA: Diagnosis present

## 2022-02-03 DIAGNOSIS — Z96612 Presence of left artificial shoulder joint: Secondary | ICD-10-CM | POA: Diagnosis present

## 2022-02-03 DIAGNOSIS — E43 Unspecified severe protein-calorie malnutrition: Secondary | ICD-10-CM | POA: Diagnosis present

## 2022-02-03 DIAGNOSIS — E440 Moderate protein-calorie malnutrition: Secondary | ICD-10-CM | POA: Insufficient documentation

## 2022-02-03 DIAGNOSIS — Z978 Presence of other specified devices: Secondary | ICD-10-CM | POA: Diagnosis not present

## 2022-02-03 DIAGNOSIS — I4719 Other supraventricular tachycardia: Secondary | ICD-10-CM | POA: Diagnosis present

## 2022-02-03 DIAGNOSIS — J90 Pleural effusion, not elsewhere classified: Secondary | ICD-10-CM | POA: Diagnosis not present

## 2022-02-03 DIAGNOSIS — B961 Klebsiella pneumoniae [K. pneumoniae] as the cause of diseases classified elsewhere: Secondary | ICD-10-CM | POA: Diagnosis present

## 2022-02-03 DIAGNOSIS — I071 Rheumatic tricuspid insufficiency: Secondary | ICD-10-CM | POA: Diagnosis present

## 2022-02-03 DIAGNOSIS — R109 Unspecified abdominal pain: Secondary | ICD-10-CM | POA: Diagnosis not present

## 2022-02-03 DIAGNOSIS — I5189 Other ill-defined heart diseases: Secondary | ICD-10-CM

## 2022-02-03 DIAGNOSIS — Z85828 Personal history of other malignant neoplasm of skin: Secondary | ICD-10-CM | POA: Diagnosis not present

## 2022-02-03 DIAGNOSIS — I2510 Atherosclerotic heart disease of native coronary artery without angina pectoris: Secondary | ICD-10-CM | POA: Diagnosis present

## 2022-02-03 DIAGNOSIS — T8140XA Infection following a procedure, unspecified, initial encounter: Secondary | ICD-10-CM | POA: Diagnosis not present

## 2022-02-03 DIAGNOSIS — I700 Atherosclerosis of aorta: Secondary | ICD-10-CM | POA: Diagnosis not present

## 2022-02-03 DIAGNOSIS — R197 Diarrhea, unspecified: Secondary | ICD-10-CM | POA: Diagnosis not present

## 2022-02-03 DIAGNOSIS — F419 Anxiety disorder, unspecified: Secondary | ICD-10-CM | POA: Diagnosis present

## 2022-02-03 DIAGNOSIS — D75839 Thrombocytosis, unspecified: Secondary | ICD-10-CM | POA: Diagnosis present

## 2022-02-03 DIAGNOSIS — Z1611 Resistance to penicillins: Secondary | ICD-10-CM | POA: Diagnosis present

## 2022-02-03 DIAGNOSIS — R0989 Other specified symptoms and signs involving the circulatory and respiratory systems: Secondary | ICD-10-CM | POA: Diagnosis not present

## 2022-02-03 DIAGNOSIS — E7800 Pure hypercholesterolemia, unspecified: Secondary | ICD-10-CM | POA: Diagnosis present

## 2022-02-03 DIAGNOSIS — Z96641 Presence of right artificial hip joint: Secondary | ICD-10-CM | POA: Diagnosis present

## 2022-02-03 DIAGNOSIS — E782 Mixed hyperlipidemia: Secondary | ICD-10-CM | POA: Diagnosis not present

## 2022-02-03 DIAGNOSIS — G43909 Migraine, unspecified, not intractable, without status migrainosus: Secondary | ICD-10-CM | POA: Diagnosis not present

## 2022-02-03 DIAGNOSIS — K750 Abscess of liver: Secondary | ICD-10-CM | POA: Diagnosis not present

## 2022-02-03 DIAGNOSIS — G8918 Other acute postprocedural pain: Secondary | ICD-10-CM | POA: Diagnosis present

## 2022-02-03 LAB — CBC WITH DIFFERENTIAL/PLATELET
Abs Immature Granulocytes: 0.35 10*3/uL — ABNORMAL HIGH (ref 0.00–0.07)
Basophils Absolute: 0.1 10*3/uL (ref 0.0–0.1)
Basophils Relative: 0 %
Eosinophils Absolute: 0.1 10*3/uL (ref 0.0–0.5)
Eosinophils Relative: 0 %
HCT: 33.9 % — ABNORMAL LOW (ref 36.0–46.0)
Hemoglobin: 10.9 g/dL — ABNORMAL LOW (ref 12.0–15.0)
Immature Granulocytes: 2 %
Lymphocytes Relative: 8 %
Lymphs Abs: 1.5 10*3/uL (ref 0.7–4.0)
MCH: 29.5 pg (ref 26.0–34.0)
MCHC: 32.2 g/dL (ref 30.0–36.0)
MCV: 91.6 fL (ref 80.0–100.0)
Monocytes Absolute: 1.8 10*3/uL — ABNORMAL HIGH (ref 0.1–1.0)
Monocytes Relative: 9 %
Neutro Abs: 15.5 10*3/uL — ABNORMAL HIGH (ref 1.7–7.7)
Neutrophils Relative %: 81 %
Platelets: 539 10*3/uL — ABNORMAL HIGH (ref 150–400)
RBC: 3.7 MIL/uL — ABNORMAL LOW (ref 3.87–5.11)
RDW: 14.7 % (ref 11.5–15.5)
WBC: 19.2 10*3/uL — ABNORMAL HIGH (ref 4.0–10.5)
nRBC: 0 % (ref 0.0–0.2)

## 2022-02-03 LAB — COMPREHENSIVE METABOLIC PANEL
ALT: 34 U/L (ref 0–44)
AST: 31 U/L (ref 15–41)
Albumin: 2.3 g/dL — ABNORMAL LOW (ref 3.5–5.0)
Alkaline Phosphatase: 89 U/L (ref 38–126)
Anion gap: 12 (ref 5–15)
BUN: 7 mg/dL — ABNORMAL LOW (ref 8–23)
CO2: 27 mmol/L (ref 22–32)
Calcium: 8.8 mg/dL — ABNORMAL LOW (ref 8.9–10.3)
Chloride: 102 mmol/L (ref 98–111)
Creatinine, Ser: 0.56 mg/dL (ref 0.44–1.00)
GFR, Estimated: >60 mL/min (ref >60)
Glucose, Bld: 113 mg/dL — ABNORMAL HIGH (ref 70–99)
Potassium: 4 mmol/L (ref 3.5–5.1)
Sodium: 141 mmol/L (ref 135–145)
Total Bilirubin: 0.8 mg/dL (ref 0.3–1.2)
Total Protein: 5.8 g/dL — ABNORMAL LOW (ref 6.5–8.1)

## 2022-02-03 LAB — LACTIC ACID, PLASMA: Lactic Acid, Venous: 1.2 mmol/L (ref 0.5–1.9)

## 2022-02-03 LAB — LIPASE, BLOOD: Lipase: 25 U/L (ref 11–51)

## 2022-02-03 MED ORDER — SODIUM CHLORIDE 0.9 % IV BOLUS
500.0000 mL | Freq: Once | INTRAVENOUS | Status: AC
  Administered 2022-02-03: 500 mL via INTRAVENOUS

## 2022-02-03 MED ORDER — PIPERACILLIN-TAZOBACTAM 3.375 G IVPB 30 MIN: 3.3750 g | Freq: Three times a day (TID) | INTRAVENOUS | Status: DC

## 2022-02-03 MED ORDER — PIPERACILLIN-TAZOBACTAM 3.375 G IVPB
3.3750 g | Freq: Three times a day (TID) | INTRAVENOUS | Status: DC
  Administered 2022-02-03 – 2022-02-06 (×8): 3.375 g via INTRAVENOUS
  Filled 2022-02-03 (×8): qty 50

## 2022-02-03 MED ORDER — IOHEXOL 9 MG/ML PO SOLN
ORAL | Status: AC
  Filled 2022-02-03: qty 500

## 2022-02-03 MED ORDER — IOHEXOL 300 MG/ML  SOLN
100.0000 mL | Freq: Once | INTRAMUSCULAR | Status: AC | PRN
  Administered 2022-02-03: 100 mL via INTRAVENOUS

## 2022-02-03 MED ORDER — OXYCODONE HCL 5 MG PO TABS: 5.0000 mg | ORAL_TABLET | Freq: Four times a day (QID) | ORAL | Status: DC | PRN

## 2022-02-03 MED ORDER — HYDROMORPHONE HCL 1 MG/ML IJ SOLN
0.5000 mg | INTRAMUSCULAR | Status: DC | PRN
  Administered 2022-02-03 – 2022-02-11 (×11): 0.5 mg via INTRAVENOUS
  Filled 2022-02-03 (×11): qty 0.5

## 2022-02-03 MED ORDER — ACETAMINOPHEN 325 MG PO TABS: 650.0000 mg | ORAL_TABLET | Freq: Four times a day (QID) | ORAL | Status: DC | PRN

## 2022-02-03 MED ORDER — LACTATED RINGERS IV SOLN: INTRAVENOUS | Status: DC

## 2022-02-03 MED ORDER — PIPERACILLIN-TAZOBACTAM 3.375 G IVPB 30 MIN
3.3750 g | Freq: Once | INTRAVENOUS | Status: AC
  Administered 2022-02-03: 3.375 g via INTRAVENOUS
  Filled 2022-02-03: qty 50

## 2022-02-03 MED ORDER — ONDANSETRON HCL 4 MG/2ML IJ SOLN
4.0000 mg | Freq: Four times a day (QID) | INTRAMUSCULAR | Status: DC | PRN
  Administered 2022-02-08 – 2022-02-09 (×2): 4 mg via INTRAVENOUS
  Filled 2022-02-03 (×3): qty 2

## 2022-02-03 MED ORDER — ONDANSETRON HCL 4 MG PO TABS
4.0000 mg | ORAL_TABLET | Freq: Four times a day (QID) | ORAL | Status: DC | PRN
  Filled 2022-02-03: qty 1

## 2022-02-03 MED ORDER — METOPROLOL TARTRATE 5 MG/5ML IV SOLN
5.0000 mg | Freq: Two times a day (BID) | INTRAVENOUS | Status: DC
  Administered 2022-02-03 – 2022-02-04 (×2): 5 mg via INTRAVENOUS
  Filled 2022-02-03 (×2): qty 5

## 2022-02-03 MED ORDER — ACETAMINOPHEN 650 MG RE SUPP: 650.0000 mg | Freq: Four times a day (QID) | RECTAL | Status: DC | PRN

## 2022-02-03 NOTE — ED Triage Notes (Signed)
Pt to er, pt states that she had gallbladder surgery about a week ago, states that she isn't getting any better, states that her drain hasn't been draining for the past two days and she is till having pain, states that she hasn't had a fever, but her surgery ctr told her to come to the er.

## 2022-02-03 NOTE — H&P (Signed)
History and Physical    Patient: Alexis Hines HGD:[id number] DOB: May 01, 1933 DOA: 02/03/2022 DOS: the patient was seen and examined on 02/03/2022 PCP: Rita Ohara, MD  Patient coming from: Home  Chief Complaint:  Chief Complaint  Patient presents with   Post-op Problem   HPI: Alexis Hines is a 88 y.o. female with medical history significant of anxiety, abnormal TSH, osteoarthritis, basal cell cancer of the nose, diverticulosis, dizziness, migraine headaches, PAC, history of paroxysmal SVT, grade 1 diastolic dysfunction, overweight who was recently admitted and discharged due to acute gangrenous cholecystitis treated with laparoscopic cholecystectomy with JP drain placement on 10/31 by Dr. Thermon Leyland.  She returns to the emergency department due to decreased JP drain output and worsening of abdominal pain to 8 out of 10 despite using oxycodone.  Today, she she felt warm and took her temperature which was 66 F. He denied fever, chills, rhinorrhea, sore throat, wheezing or hemoptysis.  No chest pain, palpitations, diaphoresis, PND, orthopnea or pitting edema of the lower extremities.  No abdominal pain, nausea, emesis, diarrhea, constipation, melena or hematochezia.  No flank pain, dysuria, frequency or hematuria.  No polyuria, polydipsia, polyphagia or blurred vision.   ED course: Initial vital signs were temperature 98.1 F, pulse 56, respiration 18, BP 154/63 mmHg O2 sat 98% on room air.  The patient received 500 mL of normal saline bolus and 3.375 g of Zosyn IVPB.  Lab work: CBC showed a white count of 19.2 with 81% neutrophils, hemoglobin 10.9 g/dL platelets 539.  Lactic acid and lipase were normal.  CMP showed a glucose of 113 mg/deciliter, total protein of 5.8 and albumin of 2.3 g/dL.  The rest of the measurements were normal after calcium was corrected.  Imaging: CT abdomen/pelvis with contrast showed status postcholecystectomy with surgical drain positioned in the gallbladder  fossa.  There are multiple air and fluid collections in the gallbladder fossa and about the right lobe of the liver, concerning for postop infection.  There is intra and extrahepatic biliary ductal dilation, the CBD measuring up to 1.0 cm.  Wall thickening of the hepatic flexure and transverse colon in the right upper quadrant, likely reactive to adjacent inflammation.  Small right, trace left pleural effusions with associated atelectasis or consolidation.  There is cardiomegaly and coronary artery disease.  There is aortic atherosclerosis.   Review of Systems: As mentioned in the history of present illness. All other systems reviewed and are negative.  Past Medical History:  Diagnosis Date   Abnormal TSH 2004   normal since   Arthritis    Basal cell cancer    nose; Dr. Allyson Sabal    Diverticulosis 10/2005   Dizziness    Migraine childhood   resolved   PAC (premature atrial contraction)    frequent on cardiac monitor 09/14/19   Pure hypercholesterolemia    Radial styloid tenosynovitis (de quervain) 11/2018   injected by Dr. Fredna Dow 11/2018, 02/2019   SCCA (squamous cell carcinoma) of skin 12/2015   right temple   Squamous cell carcinoma of skin 02/15/2017   in situ-left jaw ([id number])   Past Surgical History:  Procedure Laterality Date   BREAST BIOPSY Left 1984   benign   CHOLECYSTECTOMY N/A 01/27/2022   Procedure: LAPAROSCOPIC CHOLECYSTECTOMY;  Surgeon: Felicie Morn, MD;  Location: [facility];  Service: General;  Laterality: N/A;   EYE SURGERY     Bil and eyelids lifted   left hip intra-articular steriod injection  03/13/2014   Mohs procedure  REVERSE SHOULDER ARTHROPLASTY Left 12/26/2020   Procedure: REVERSE SHOULDER ARTHROPLASTY;  Surgeon: Justice Britain, MD;  Location: [facility];  Service: Orthopedics;  Laterality: Left;  145mn   TONSILLECTOMY  age 88  TOTAL HIP ARTHROPLASTY Right 10/10/2019   Procedure: TOTAL HIP ARTHROPLASTY ANTERIOR APPROACH;  Surgeon: OParalee Cancel MD;   Location: [facility];  Service: Orthopedics;  Laterality: Right;  70 mins   TOTAL KNEE ARTHROPLASTY Left 07/2003   Dr. CTheda Sers  TOTAL KNEE ARTHROPLASTY Right 09/03/2017   Procedure: RIGHT TOTAL KNEE ARTHROPLASTY;  Surgeon: CSydnee Cabal MD;  Location: [facility];  Service: Orthopedics;  Laterality: Right;   Social History:  reports that she has never smoked. She has never used smokeless tobacco. She reports current alcohol use. She reports that she does not use drugs.  No Known Allergies  Family History  Problem Relation Age of Onset   Cancer Sister        bone cancer L leg with mets to lung, diagnosed age 86 s/p amputation   Hyperlipidemia Brother    Heart disease Brother 740      CABG   Cancer Sister 669      breast cancer and lung cancer (lung dx'd first)   Rheum arthritis Daughter    Gout Son     Prior to Admission medications   Medication Sig Start Date End Date Taking? Authorizing Provider  acetaminophen (TYLENOL) 500 MG tablet Take 2 tablets (1,000 mg total) by mouth every 6 (six) hours as needed. 01/29/22   OSaverio Danker PA-C  ADVIL 200 MG CAPS Take 200 mg by mouth every 6 (six) hours as needed (for headaches).    [provider]  amoxicillin (AMOXIL) 500 MG capsule Take 2,000 mg by mouth See admin instructions. Take 2,000 mg by mouth one hour prior to dental appointments 12/02/21   [provider]  amoxicillin-clavulanate (AUGMENTIN) 875-125 MG tablet Take 1 tablet by mouth 2 (two) times daily for 8 days. 01/29/22 02/06/22  OSaverio Danker PA-C  atorvastatin (LIPITOR) 40 MG tablet TAKE 1 TABLET BY MOUTH AT  BEDTIME Patient taking differently: Take 40 mg by mouth at bedtime. 11/27/21   KRita Ohara MD  Biotin 10000 MCG TABS Take 10,000 mcg by mouth in the morning.    [provider]  Calcium Carbonate-Vitamin D (CALTRATE 600+D PO) Take 1 tablet by mouth daily.    [provider]  ipratropium (ATROVENT) 0.06 % nasal spray USE 2 SPRAYS IN EACH NOSTRIL  3 TO 4 TIMES DAILY FOR RUNNY NOSE Patient taking differently: Place 2 sprays into both nostrils See admin instructions. Instill 2 sprays into each nostril 2-3 times a day for a runny nose 09/15/21   KRita Ohara MD  metoprolol succinate (TOPROL-XL) 50 MG 24 hr tablet Take 1 tablet (50 mg total) by mouth daily. Take with or immediately following a meal. 12/24/21   KRita Ohara MD  Multiple Vitamin (MULTIVITAMIN WITH MINERALS) TABS tablet Take 1 tablet by mouth daily with breakfast. CENTRUM MULTIVITAMIN    [provider]  omeprazole (PRILOSEC) 20 MG capsule Take 20 mg by mouth daily before breakfast.    [provider]  oxyCODONE (OXY IR/ROXICODONE) 5 MG immediate release tablet Take 1 tablet (5 mg total) by mouth every 4 (four) hours as needed for moderate pain. 01/29/22   OSaverio Danker PA-C  PREVIDENT 5000 BOOSTER PLUS 1.1 % PSTE Place 1 application  onto teeth every 3 (three) days. 10/30/21   [provider]  Probiotic Product (PROBIOTIC PO) Take 1 capsule by mouth daily.    [provider]  Wheat Dextrin (BENEFIBER PO) Take 8-10 g by mouth See admin instructions. Mix 8-10 grams (2 teaspoonsful) of powder into applesauce or the suggested amount of warm water and consume 2 times a day    [provider]    Physical Exam: Vitals:   02/03/22 0942 02/03/22 1200 02/03/22 1335  BP: (!) 154/63 (!) 147/68 (!) 144/79  Pulse: (!) 56 73 (!) 59  Resp: '18 16 16  '$ Temp: 98.1 F (36.7 C)  98 F (36.7 C)  TempSrc: Oral    SpO2: 98% 94% 95%  Weight: 56.7 kg    Height: '4\' 10"'$  (1.473 m)     Physical Exam Vitals and nursing note reviewed.  Constitutional:      General: She is awake. She is not in acute distress.    Appearance: Normal appearance.     Comments: Chronically ill-appearing.  HENT:     Head: Normocephalic.     Nose: No rhinorrhea.     Mouth/Throat:     Mouth: Mucous membranes are dry.  Eyes:     General: No scleral icterus.    Pupils: Pupils are  equal, round, and reactive to light.  Neck:     Vascular: No JVD.  Cardiovascular:     Rate and Rhythm: Normal rate and regular rhythm.     Heart sounds: S1 normal and S2 normal.  Pulmonary:     Effort: Pulmonary effort is normal.     Breath sounds: Normal breath sounds.  Abdominal:     General: Bowel sounds are normal.     Palpations: Abdomen is soft.     Tenderness: There is abdominal tenderness.  Musculoskeletal:     Cervical back: Neck supple.     Right lower leg: No edema.     Left lower leg: No edema.  Skin:    General: Skin is warm and dry.  Neurological:     General: No focal deficit present.     Mental Status: She is alert and oriented to person, place, and time.  Psychiatric:        Mood and Affect: Mood normal.        Behavior: Behavior normal. Behavior is cooperative.    Data Reviewed:  There are no new results to review at this time.  08/19/2021 echocardiogram IMPRESSIONS    1. Left ventricular ejection fraction, by estimation, is 65 to 70%. The  left ventricle has normal function. The left ventricle has no regional  wall motion abnormalities. Left ventricular diastolic parameters are  consistent with Grade I diastolic  dysfunction (impaired relaxation).   2. Right ventricular systolic function is normal. The right ventricular  size is normal. There is normal pulmonary artery systolic pressure.   3. Left atrial size was severely dilated.   4. Right atrial size was severely dilated.   5. The mitral valve is degenerative. Trivial mitral valve regurgitation.  No evidence of mitral stenosis.   6. Tricuspid valve regurgitation is mild to moderate.   7. The aortic valve is tricuspid. There is mild calcification of the  aortic valve. There is mild thickening of the aortic valve. Aortic valve  regurgitation is mild. No aortic stenosis is present.   8. The inferior vena cava is normal in size with greater than 50%  respiratory variability, suggesting right atrial  pressure of 3 mmHg.   Assessment and Plan: Principal Problem:   Postoperative infection,  initial encounter Admit to telemetry/inpatient. Keep NPO. LR at 75 mL/h x 20 hours. Analgesics as needed. Antiemetics as needed. Continue Zosyn 3.375 g every 8 hours. Surgical team consult appreciated.  Active Problems:   Pure hypercholesterolemia Hold atorvastatin for now. Resume once cleared for oral intake.    Paroxysmal SVT (supraventricular tachycardia) Metoprolol 5 mg IVP every 12 hours while NPO. Monitor heart rate closely.    Grade I diastolic dysfunction No signs of decompensation. Continue beta-blocker. May benefit from ACE/ARB.    Protein-calorie malnutrition, severe (HCC) Protein supplementation. Consider nutritional services evaluation.    Normocytic anemia Monitor hematocrit and hemoglobin.     Advance Care Planning:   Code Status: Prior   Consults: General surgery interventional radiology.  Family Communication:  Severity of Illness: The appropriate patient status for this patient is INPATIENT. Inpatient status is judged to be reasonable and necessary in order to provide the required intensity of service to ensure the patient's safety. The patient's presenting symptoms, physical exam findings, and initial radiographic and laboratory data in the context of their chronic comorbidities is felt to place them at high risk for further clinical deterioration. Furthermore, it is not anticipated that the patient will be medically stable for discharge from the hospital within 2 midnights of admission.   * I certify that at the point of admission it is my clinical judgment that the patient will require inpatient hospital care spanning beyond 2 midnights from the point of admission due to high intensity of service, high risk for further deterioration and high frequency of surveillance required.*  Author: Reubin Milan, MD 02/03/2022 3:27 PM  For on call review  [email or web address].   This document was prepared using Dragon voice recognition software and may contain some unintended transcription errors.

## 2022-02-03 NOTE — ED Notes (Signed)
Patient transported to CT 

## 2022-02-03 NOTE — ED Provider Notes (Signed)
[facility] DEPT Provider Note   CSN: [id number] Arrival date & time: 02/03/22  9983     History  Chief Complaint  Patient presents with   Post-op Problem    Alexis Hines is a 88 y.o. female.  HPI Patient presents 1 week after discharge following admission for laparoscopic cholecystectomy after an episode of abdominal pain with diagnosis of gangrenous cholecystitis.  She notes that over the past 2 days in particular she has developed diffuse abdominal pain and minimal drainage into her JP drain.  She complains of fever, nausea at home as well.  No relief with anything.  She spoke with her surgery, and was advised to come to the ED for evaluation.    Home Medications Prior to Admission medications   Medication Sig Start Date End Date Taking? Authorizing Provider  acetaminophen (TYLENOL) 500 MG tablet Take 2 tablets (1,000 mg total) by mouth every 6 (six) hours as needed. 01/29/22   Saverio Danker, PA-C  ADVIL 200 MG CAPS Take 200 mg by mouth every 6 (six) hours as needed (for headaches).    [provider]  amoxicillin (AMOXIL) 500 MG capsule Take 2,000 mg by mouth See admin instructions. Take 2,000 mg by mouth one hour prior to dental appointments 12/02/21   [provider]  amoxicillin-clavulanate (AUGMENTIN) 875-125 MG tablet Take 1 tablet by mouth 2 (two) times daily for 8 days. 01/29/22 02/06/22  Saverio Danker, PA-C  atorvastatin (LIPITOR) 40 MG tablet TAKE 1 TABLET BY MOUTH AT  BEDTIME Patient taking differently: Take 40 mg by mouth at bedtime. 11/27/21   Rita Ohara, MD  Biotin 10000 MCG TABS Take 10,000 mcg by mouth in the morning.    [provider]  Calcium Carbonate-Vitamin D (CALTRATE 600+D PO) Take 1 tablet by mouth daily.    [provider]  ipratropium (ATROVENT) 0.06 % nasal spray USE 2 SPRAYS IN EACH NOSTRIL 3 TO 4 TIMES DAILY FOR RUNNY NOSE Patient taking differently: Place 2 sprays into both nostrils  See admin instructions. Instill 2 sprays into each nostril 2-3 times a day for a runny nose 09/15/21   Rita Ohara, MD  metoprolol succinate (TOPROL-XL) 50 MG 24 hr tablet Take 1 tablet (50 mg total) by mouth daily. Take with or immediately following a meal. 12/24/21   Rita Ohara, MD  Multiple Vitamin (MULTIVITAMIN WITH MINERALS) TABS tablet Take 1 tablet by mouth daily with breakfast. CENTRUM MULTIVITAMIN    [provider]  omeprazole (PRILOSEC) 20 MG capsule Take 20 mg by mouth daily before breakfast.    [provider]  oxyCODONE (OXY IR/ROXICODONE) 5 MG immediate release tablet Take 1 tablet (5 mg total) by mouth every 4 (four) hours as needed for moderate pain. 01/29/22   Saverio Danker, PA-C  PREVIDENT 5000 BOOSTER PLUS 1.1 % PSTE Place 1 application  onto teeth every 3 (three) days. 10/30/21   [provider]  Probiotic Product (PROBIOTIC PO) Take 1 capsule by mouth daily.    [provider]  Wheat Dextrin (BENEFIBER PO) Take 8-10 g by mouth See admin instructions. Mix 8-10 grams (2 teaspoonsful) of powder into applesauce or the suggested amount of warm water and consume 2 times a day    [provider]      Allergies    Patient has no known allergies.    Review of Systems   Review of Systems  All other systems reviewed and are negative.   Physical Exam Updated Vital Signs  BP (!) 147/68   Pulse 73   Temp 98.1 F (36.7 C) (Oral)   Resp 16   Ht '4\' 10"'$  (1.473 m)   Wt 56.7 kg   SpO2 94%   BMI 26.13 kg/m  Physical Exam Vitals and nursing note reviewed.  Constitutional:      General: She is not in acute distress.    Appearance: She is well-developed.  HENT:     Head: Normocephalic and atraumatic.  Eyes:     Conjunctiva/sclera: Conjunctivae normal.  Cardiovascular:     Rate and Rhythm: Normal rate and regular rhythm.  Pulmonary:     Effort: Pulmonary effort is normal. No respiratory distress.     Breath sounds: Normal breath sounds.  No stridor.  Abdominal:     General: There is no distension.     Tenderness: There is abdominal tenderness. There is guarding.  Skin:    General: Skin is warm and dry.  Neurological:     Mental Status: She is alert and oriented to person, place, and time.     Cranial Nerves: No cranial nerve deficit.  Psychiatric:        Mood and Affect: Mood normal.     ED Results / Procedures / Treatments   Labs (all labs ordered are listed, but only abnormal results are displayed) Labs Reviewed  COMPREHENSIVE METABOLIC PANEL - Abnormal; Notable for the following components:      Result Value   Glucose, Bld 113 (*)    BUN 7 (*)    Calcium 8.8 (*)    Total Protein 5.8 (*)    Albumin 2.3 (*)    All other components within normal limits  CBC WITH DIFFERENTIAL/PLATELET - Abnormal; Notable for the following components:   WBC 19.2 (*)    RBC 3.70 (*)    Hemoglobin 10.9 (*)    HCT 33.9 (*)    Platelets 539 (*)    Neutro Abs 15.5 (*)    Monocytes Absolute 1.8 (*)    Abs Immature Granulocytes 0.35 (*)    All other components within normal limits  LACTIC ACID, PLASMA  LIPASE, BLOOD  LACTIC ACID, PLASMA    EKG None  Radiology CT Abdomen Pelvis W Contrast  Result Date: 02/03/2022 CLINICAL DATA:  Postoperative abdominal pain, status post cholecystectomy, persistent pain, no drainage from drainage catheter EXAM: CT ABDOMEN AND PELVIS WITH CONTRAST TECHNIQUE: Multidetector CT imaging of the abdomen and pelvis was performed using the standard protocol following bolus administration of intravenous contrast. RADIATION DOSE REDUCTION: This exam was performed according to the departmental dose-optimization program which includes automated exposure control, adjustment of the mA and/or kV according to patient size and/or use of iterative reconstruction technique. CONTRAST:  170m OMNIPAQUE IOHEXOL 300 MG/ML SOLN, additional oral enteric contrast COMPARISON:  01/27/2022 FINDINGS: Lower chest: Small right,  trace left pleural effusions with associated atelectasis or consolidation. Cardiomegaly and coronary artery calcifications. Aortic valve calcifications. Hepatobiliary: No focal liver abnormality is seen. Status post cholecystectomy. Intra and extrahepatic biliary ductal dilatation, the common bile duct measuring up to 1.0 cm (series 5, image 53). Surgical drain is positioned in the gallbladder fossa with a small air and fluid collection in the gallbladder fossa measuring 2.7 x 1.8 cm (series 2, image 31). Additional small fluid collections about the right lobe of the liver, laterally measuring 5.7 x 2.0 cm (series 2, image 36) and anteriorly measuring 5.0 x 1.2 cm (series 2, image 36). Pancreas: Unremarkable. No pancreatic ductal dilatation or surrounding inflammatory  changes. Spleen: Normal in size without significant abnormality. Adrenals/Urinary Tract: Adrenal glands are unremarkable. Kidneys are normal, without renal calculi, solid lesion, or hydronephrosis. Bladder is unremarkable. Stomach/Bowel: Stomach is within normal limits. Appendix appears normal. Wall thickening of the hepatic flexure and transverse colon in the right upper quadrant (series 2, image 38). Vascular/Lymphatic: Aortic atherosclerosis. No enlarged abdominal or pelvic lymph nodes. Reproductive: No mass or other significant abnormality. Other: No abdominal wall hernia or abnormality. No ascites. Musculoskeletal: No acute or significant osseous findings. IMPRESSION: 1. Status post cholecystectomy. Surgical drain is positioned in the gallbladder fossa. 2. Multiple air and fluid collections in the gallbladder fossa and about the right lobe of the liver, concerning for postoperative infection. The presence or absence of infection within these collections is however not established by CT. 3. Intra and extrahepatic biliary ductal dilatation, the common bile duct measuring up to 1.0 cm. 4. Wall thickening of the hepatic flexure and transverse colon in  the right upper quadrant, likely reactive to adjacent inflammation. 5. Small right, trace left pleural effusions with associated atelectasis or consolidation. 6. Cardiomegaly and coronary artery disease. Aortic Atherosclerosis (ICD10-I70.0). Electronically Signed   By: Delanna Ahmadi M.D.   On: 02/03/2022 13:23    Procedures Procedures    Medications Ordered in ED Medications  sodium chloride 0.9 % bolus 500 mL (0 mLs Intravenous Stopped 02/03/22 1158)  piperacillin-tazobactam (ZOSYN) IVPB 3.375 g (0 g Intravenous Stopped 02/03/22 1231)  iohexol (OMNIPAQUE) 9 MG/ML oral solution (  Contrast Given 02/03/22 1205)  iohexol (OMNIPAQUE) 300 MG/ML solution 100 mL (100 mLs Intravenous Contrast Given 02/03/22 1255)    ED Course/ Medical Decision Making/ A&P                           Medical Decision Making  patient presents 2 days after adult female with multiple medical issues most notably recent admission for gangrenous cholecystitis, now status post surgery presenting after 2 days of worsening abdominal pain, minimal drain output and fever.  Initial concern for bacteremia, sepsis, obstructed drainage tubing for postop infection.  With tender abdominal exam patient had CT scan, labs.  Amount and/or Complexity of Data Reviewed External Data Reviewed: notes.    Details: Hospitalization notes including op note from last week Labs: ordered. Decision-making details documented in ED Course.    Details: Labs concerning for worsening leukocytosis. Radiology: ordered and independent interpretation performed. Decision-making details documented in ED Course. ECG/medicine tests:  Decision-making details documented in ED Course. Discussion of management or test interpretation with external provider(s): Consultation with surgery executed soon after the patient's initial evaluation in case coordinated with them.  Patient received broad-spectrum antibiotics on arrival.  Patient will require admission for further  evaluation.  Risk Prescription drug management. Parenteral controlled substances. Decision regarding hospitalization. Risk Details: This adult female now 1 week status post cholecystectomy presents with abdominal pain, fever is found to have labs, CT concerning for infection.  Patient received broad-spectrum antibiotics on arrival, case coordinated with her surgical colleagues, patient required admission for ongoing management.  Final Clinical Impression(s) / ED Diagnoses Final diagnoses:  Infection of organ or organ space after surgery, initial encounter     Carmin Muskrat, MD 02/03/22 1341

## 2022-02-03 NOTE — Telephone Encounter (Signed)
Patient advised.

## 2022-02-03 NOTE — Progress Notes (Addendum)
Progress Note     Subjective: 88 year old female with history of hypertension, hyperlipidemia who was recently admitted to our service from 10/31-11/2 for acute gangrenous cholecystitis.  She underwent laparoscopic cholecystectomy with JP drain placement on 10/31 by Dr. Thermon Leyland.  She was discharged from that admission tolerating a diet, pain controlled, drain output serosanguineous.  She was on Zosyn during admission and was discharged on Augmentin to complete a 10-day course total.  Since discharge she said she was initially doing well with pain control and drain was with consistent serosanguineous output. She has been compliant with antibiotic. Over the last few days she has had worsening abdominal pain up to 8/10 which is not improved with oxycodone.  She has not been taking ibuprofen or Tylenol.  She has not noticed drainage into her JP for the last several days.  Today she noted a temperature of 99 F and due to this and ongoing abdominal pain she called our office and we recommended she proceed to the emergency department for further evaluation. She has decreased appetite but has been tolerating a diet without nausea or vomiting. She has been having daily loose bowel movements and took imodium last night. General surgery asked to see due to her ongoing pain, elevated WBC, and recent surgery.  She denies SHOB, urinary symptoms, leg pain/swelling.  Objective: Vital signs in last 24 hours: Temp:  [98.1 F (36.7 C)] 98.1 F (36.7 C) (11/07 0942) Pulse Rate:  [56-73] 73 (11/07 1200) Resp:  [16-18] 16 (11/07 1200) BP: (147-154)/(63-68) 147/68 (11/07 1200) SpO2:  [94 %-98 %] 94 % (11/07 1200) Weight:  [56.7 kg] 56.7 kg (11/07 0942)    Intake/Output from previous day: No intake/output data recorded. Intake/Output this shift: Total I/O In: 500 [IV Piggyback:500] Out: -   PE: General: pleasant, WD, female who is laying in bed in NAD HEENT: head is normocephalic, atraumatic.  Sclera  are noninjected.  Pupils equal and round. EOMs intact.  Ears and nose without any masses or lesions.  Mouth is pink and moist Heart: regular, rate, and rhythm.  Palpable radial pulses bilaterally Lungs: Respiratory effort nonlabored on room air Abd: soft, +BS, no masses, hernias, or organomegaly. Mild distension. Moderate TTP over incision and RUQ. Greatest in RUQ. No rebound or guarding. Incisions with c/d/I with surgical glue and mild surrounding ecchymosis. No erythema, induration, purulent drainage. JP drain with bloody thick output in tubing and clogging stopper port. I removed clog and JP now with good suction and draining SS output. Mild erythema around insertion site MSK: all 4 extremities are symmetrical with no cyanosis, clubbing, or edema. Skin: warm and dry with no masses, lesions, or rashes. Calves soft and nontender Neuro: Cranial nerves 2-12 grossly intact, sensation is normal throughout Psych: A&Ox3 with an appropriate affect.    Lab Results:  Recent Labs    02/03/22 1102  WBC 19.2*  HGB 10.9*  HCT 33.9*  PLT 539*   BMET Recent Labs    02/03/22 1102  NA 141  K 4.0  CL 102  CO2 27  GLUCOSE 113*  BUN 7*  CREATININE 0.56  CALCIUM 8.8*   PT/INR No results for input(s): "LABPROT", "INR" in the last 72 hours. CMP     Component Value Date/Time   NA 141 02/03/2022 1102   NA 135 01/26/2022 1202   K 4.0 02/03/2022 1102   CL 102 02/03/2022 1102   CO2 27 02/03/2022 1102   GLUCOSE 113 (H) 02/03/2022 1102   BUN 7 (L)  02/03/2022 1102   BUN 11 01/26/2022 1202   CREATININE 0.56 02/03/2022 1102   CALCIUM 8.8 (L) 02/03/2022 1102   PROT 5.8 (L) 02/03/2022 1102   PROT 5.8 (L) 01/26/2022 1202   ALBUMIN 2.3 (L) 02/03/2022 1102   ALBUMIN 3.5 (L) 01/26/2022 1202   AST 31 02/03/2022 1102   ALT 34 02/03/2022 1102   ALKPHOS 89 02/03/2022 1102   BILITOT 0.8 02/03/2022 1102   BILITOT 0.9 01/26/2022 1202   GFRNONAA >60 02/03/2022 1102   GFRAA 78 11/09/2019 0947   Lipase      Component Value Date/Time   LIPASE 25 02/03/2022 1102       Studies/Results: No results found.  Anti-infectives: Anti-infectives (From admission, onward)    Start     Dose/Rate Route Frequency Ordered Stop   02/03/22 1130  piperacillin-tazobactam (ZOSYN) IVPB 3.375 g        3.375 g 100 mL/hr over 30 Minutes Intravenous  Once 02/03/22 1127 02/03/22 1231        Assessment/Plan Abdominal pain, leukocytosis (WBC 19.2) POD7 status post laparoscopic cholecystectomy with JP drain placement 10/31 Dr. Thermon Leyland for gangrenous cholecystitis with intra-op finding of liver abscess -WBC elevated to 19 on antibiotic therapy (Augmentin) and was 14 on discharge - intra-op culture of liver abscess 10/31 with e coli and klebsiella with resistance to ampicillin. Sensitive to zosyn. She was not adequately covered on augmentin. Continue zosyn for now but plan to transition to alternative oral coverage at time of discharge - tolerating a diet -Having ongoing abdominal pain despite oxycodone.  Await CT scan results.  Regardless of scan findings recommend multimodal pain medication for improved pain control - scheduled Tylenol, ibuprofen, as needed muscle relaxer  Further recommendations pending CT scan  FEN: NPO ID: [email or web address] VTE: none currently   LOS: 0 days   Winferd Humphrey, [facility] 02/03/2022, 12:37 PM Please see Amion for pager number during day hours 7:00am-4:30pm

## 2022-02-04 ENCOUNTER — Encounter (HOSPITAL_COMMUNITY): Payer: Self-pay | Admitting: Internal Medicine

## 2022-02-04 ENCOUNTER — Inpatient Hospital Stay (HOSPITAL_COMMUNITY): Payer: Medicare Other

## 2022-02-04 DIAGNOSIS — D649 Anemia, unspecified: Secondary | ICD-10-CM

## 2022-02-04 DIAGNOSIS — R0989 Other specified symptoms and signs involving the circulatory and respiratory systems: Secondary | ICD-10-CM

## 2022-02-04 DIAGNOSIS — I471 Supraventricular tachycardia, unspecified: Secondary | ICD-10-CM

## 2022-02-04 DIAGNOSIS — E7800 Pure hypercholesterolemia, unspecified: Secondary | ICD-10-CM

## 2022-02-04 DIAGNOSIS — I5189 Other ill-defined heart diseases: Secondary | ICD-10-CM | POA: Diagnosis not present

## 2022-02-04 DIAGNOSIS — T8140XA Infection following a procedure, unspecified, initial encounter: Secondary | ICD-10-CM | POA: Diagnosis not present

## 2022-02-04 LAB — CBC
HCT: 30.1 % — ABNORMAL LOW (ref 36.0–46.0)
Hemoglobin: 9.8 g/dL — ABNORMAL LOW (ref 12.0–15.0)
MCH: 30.2 pg (ref 26.0–34.0)
MCHC: 32.6 g/dL (ref 30.0–36.0)
MCV: 92.6 fL (ref 80.0–100.0)
Platelets: 447 10*3/uL — ABNORMAL HIGH (ref 150–400)
RBC: 3.25 MIL/uL — ABNORMAL LOW (ref 3.87–5.11)
RDW: 14.9 % (ref 11.5–15.5)
WBC: 17.7 10*3/uL — ABNORMAL HIGH (ref 4.0–10.5)
nRBC: 0 % (ref 0.0–0.2)

## 2022-02-04 LAB — COMPREHENSIVE METABOLIC PANEL
ALT: 26 U/L (ref 0–44)
AST: 25 U/L (ref 15–41)
Albumin: 1.9 g/dL — ABNORMAL LOW (ref 3.5–5.0)
Alkaline Phosphatase: 70 U/L (ref 38–126)
Anion gap: 10 (ref 5–15)
BUN: 7 mg/dL — ABNORMAL LOW (ref 8–23)
CO2: 25 mmol/L (ref 22–32)
Calcium: 7.9 mg/dL — ABNORMAL LOW (ref 8.9–10.3)
Chloride: 104 mmol/L (ref 98–111)
Creatinine, Ser: 0.54 mg/dL (ref 0.44–1.00)
GFR, Estimated: >60 mL/min (ref >60)
Glucose, Bld: 69 mg/dL — ABNORMAL LOW (ref 70–99)
Potassium: 3.5 mmol/L (ref 3.5–5.1)
Sodium: 139 mmol/L (ref 135–145)
Total Bilirubin: 1.1 mg/dL (ref 0.3–1.2)
Total Protein: 5 g/dL — ABNORMAL LOW (ref 6.5–8.1)

## 2022-02-04 LAB — PROTIME-INR
INR: 1.2 (ref 0.8–1.2)
Prothrombin Time: 15.2 seconds (ref 11.4–15.2)

## 2022-02-04 LAB — MAGNESIUM: Magnesium: 2 mg/dL (ref 1.7–2.4)

## 2022-02-04 MED ORDER — MIDAZOLAM HCL 2 MG/2ML IJ SOLN
INTRAMUSCULAR | Status: AC
  Filled 2022-02-04: qty 4

## 2022-02-04 MED ORDER — FENTANYL CITRATE (PF) 100 MCG/2ML IJ SOLN
INTRAMUSCULAR | Status: DC | PRN
  Administered 2022-02-04 (×2): 50 ug via INTRAVENOUS

## 2022-02-04 MED ORDER — FENTANYL CITRATE (PF) 100 MCG/2ML IJ SOLN
INTRAMUSCULAR | Status: AC
  Filled 2022-02-04: qty 2

## 2022-02-04 MED ORDER — LIDOCAINE HCL (PF) 1 % IJ SOLN
INTRAMUSCULAR | Status: DC | PRN
  Administered 2022-02-04: 15 mL

## 2022-02-04 MED ORDER — ATORVASTATIN CALCIUM 40 MG PO TABS
40.0000 mg | ORAL_TABLET | Freq: Every day | ORAL | Status: DC
  Administered 2022-02-05 – 2022-02-10 (×6): 40 mg via ORAL
  Filled 2022-02-04 (×7): qty 1

## 2022-02-04 MED ORDER — METOPROLOL SUCCINATE ER 50 MG PO TB24
50.0000 mg | ORAL_TABLET | Freq: Every day | ORAL | Status: DC
  Administered 2022-02-04 – 2022-02-11 (×8): 50 mg via ORAL
  Filled 2022-02-04 (×8): qty 1

## 2022-02-04 MED ORDER — SODIUM CHLORIDE 0.9% FLUSH
5.0000 mL | Freq: Three times a day (TID) | INTRAVENOUS | Status: DC
  Administered 2022-02-04 – 2022-02-09 (×13): 5 mL

## 2022-02-04 MED ORDER — MIDAZOLAM HCL 2 MG/2ML IJ SOLN
INTRAMUSCULAR | Status: DC | PRN
  Administered 2022-02-04 (×2): 1 mg via INTRAVENOUS

## 2022-02-04 NOTE — Progress Notes (Addendum)
Mobility Specialist - Progress Note   02/04/22 1338  Mobility  Activity Ambulated with assistance in hallway  Level of Assistance Standby assist, set-up cues, supervision of patient - no hands on  Assistive Device Front wheel walker  Distance Ambulated (ft) 200 ft  Activity Response Tolerated well  Mobility Referral Yes  $Mobility charge 1 Mobility   Pt received in bed and agreed to mobility. Pt had pain in abdomen nearing EOS, pt returned to bed with all needs met and bed alarm on.   Roderick Pee Mobility Specialist

## 2022-02-04 NOTE — Consult Note (Signed)
Chief Complaint: Patient was seen in consultation today for image guided hepatic abscess drainage Chief Complaint  Patient presents with   Post-op Problem    Referring Physician(s): [place]  Supervising Physician: Ruthann Cancer  Patient Status: [facility] - In-pt  History of Present Illness: Alexis Hines is an 88 y.o. female with past medical history significant for arthritis, skin cancer, hypertension, diverticulosis, migraine headaches, PAC's, hyperlipidemia who was recently admitted to the surgical team from 10/31 until 11/2 for acute gangrenous cholecystitis.  She underwent laparoscopic cholecystectomy with JP drain placement on 10/31 by Dr. Thermon Leyland and discharged home on antibiotics on 11/2. She presented to [facility] ED on 11/7 with persistent abdominal pain, minimal surgical drain output. CT scan performed yesterday revealed:  . Status post cholecystectomy. Surgical drain is positioned in the gallbladder fossa. 2. Multiple air and fluid collections in the gallbladder fossa and about the right lobe of the liver, concerning for postoperative infection. The presence or absence of infection within these collections is however not established by CT. 3. Intra and extrahepatic biliary ductal dilatation, the common bile duct measuring up to 1.0 cm. 4. Wall thickening of the hepatic flexure and transverse colon in the right upper quadrant, likely reactive to adjacent inflammation. 5. Small right, trace left pleural effusions with associated atelectasis or consolidation. 6. Cardiomegaly and coronary artery disease.   Aortic Atherosclerosis  Currently afebrile, WBC 17.7, hemoglobin 9.8, platelets 447K, creatinine normal, PT/INR normal; prior cultures grew E. coli and Klebsiella; request now received from surgical team for hepatic abscess drain placement; patient denies fever, headache, chest pain, worsening dyspnea, cough, nausea, vomiting or bleeding. She continues to have  abdominal discomfort and back pain.  Past Medical History:  Diagnosis Date   Abnormal TSH 2004   normal since   Arthritis    Basal cell cancer    nose; Dr. Allyson Sabal    Diverticulosis 10/2005   Dizziness    Migraine childhood   resolved   PAC (premature atrial contraction)    frequent on cardiac monitor 09/14/19   Pure hypercholesterolemia    Radial styloid tenosynovitis (de quervain) 11/2018   injected by Dr. Fredna Dow 11/2018, 02/2019   SCCA (squamous cell carcinoma) of skin 12/2015   right temple   Squamous cell carcinoma of skin 02/15/2017   in situ-left jaw ([id number])    Past Surgical History:  Procedure Laterality Date   BREAST BIOPSY Left 1984   benign   CHOLECYSTECTOMY N/A 01/27/2022   Procedure: LAPAROSCOPIC CHOLECYSTECTOMY;  Surgeon: Felicie Morn, MD;  Location: [facility] ORS;  Service: General;  Laterality: N/A;   EYE SURGERY     Bil and eyelids lifted   left hip intra-articular steriod injection  03/13/2014   Mohs procedure     REVERSE SHOULDER ARTHROPLASTY Left 12/26/2020   Procedure: REVERSE SHOULDER ARTHROPLASTY;  Surgeon: Justice Britain, MD;  Location: [facility] ORS;  Service: Orthopedics;  Laterality: Left;  1102mn   TONSILLECTOMY  age 88  TOTAL HIP ARTHROPLASTY Right 10/10/2019   Procedure: TOTAL HIP ARTHROPLASTY ANTERIOR APPROACH;  Surgeon: OParalee Cancel MD;  Location: [facility] ORS;  Service: Orthopedics;  Laterality: Right;  70 mins   TOTAL KNEE ARTHROPLASTY Left 07/2003   Dr. CTheda Sers  TOTAL KNEE ARTHROPLASTY Right 09/03/2017   Procedure: RIGHT TOTAL KNEE ARTHROPLASTY;  Surgeon: CSydnee Cabal MD;  Location: [facility] ORS;  Service: Orthopedics;  Laterality: Right;    Allergies: Patient has no known allergies.  Medications: Prior to Admission medications   Medication Sig Start  Date End Date Taking? Authorizing Provider  acetaminophen (TYLENOL) 500 MG tablet Take 2 tablets (1,000 mg total) by mouth every 6 (six) hours as needed. Patient taking differently: Take 1,000 mg by  mouth every 6 (six) hours as needed for moderate pain. 01/29/22  Yes Saverio Danker, PA-C  ADVIL 200 MG CAPS Take 200 mg by mouth every 6 (six) hours as needed (for headaches).   Yes [provider]  amoxicillin-clavulanate (AUGMENTIN) 875-125 MG tablet Take 1 tablet by mouth 2 (two) times daily for 8 days. 01/29/22 02/06/22 Yes Saverio Danker, PA-C  atorvastatin (LIPITOR) 40 MG tablet TAKE 1 TABLET BY MOUTH AT  BEDTIME Patient taking differently: Take 40 mg by mouth at bedtime. 11/27/21  Yes Rita Ohara, MD  Biotin 10000 MCG TABS Take 10,000 mcg by mouth in the morning.   Yes [provider]  Calcium Carbonate-Vitamin D (CALTRATE 600+D PO) Take 1 tablet by mouth daily.   Yes [provider]  ipratropium (ATROVENT) 0.06 % nasal spray USE 2 SPRAYS IN EACH NOSTRIL 3 TO 4 TIMES DAILY FOR RUNNY NOSE Patient taking differently: Place 2 sprays into both nostrils in the morning and at bedtime. 09/15/21  Yes Rita Ohara, MD  metoprolol succinate (TOPROL-XL) 50 MG 24 hr tablet Take 1 tablet (50 mg total) by mouth daily. Take with or immediately following a meal. Patient taking differently: Take 50 mg by mouth daily. 12/24/21  Yes Rita Ohara, MD  Multiple Vitamin (MULTIVITAMIN WITH MINERALS) TABS tablet Take 1 tablet by mouth daily with breakfast. CENTRUM MULTIVITAMIN   Yes [provider]  omeprazole (PRILOSEC) 20 MG capsule Take 20 mg by mouth daily before breakfast.   Yes [provider]  PREVIDENT 5000 BOOSTER PLUS 1.1 % PSTE Place 1 application  onto teeth every 3 (three) days. 10/30/21  Yes [provider]  Probiotic Product (PROBIOTIC PO) Take 1 capsule by mouth daily.   Yes [provider]  Wheat Dextrin (BENEFIBER PO) Take 8-10 g by mouth See admin instructions. Mix 8-10 grams (2 teaspoonsful) of powder into applesauce or the suggested amount of warm water and consume 2 times a day   Yes [provider]  amoxicillin (AMOXIL) 500 MG capsule  Take 2,000 mg by mouth See admin instructions. Take 2,000 mg by mouth one hour prior to dental appointments Patient not taking: Reported on 02/03/2022 12/02/21   [provider]  oxyCODONE (OXY IR/ROXICODONE) 5 MG immediate release tablet Take 1 tablet (5 mg total) by mouth every 4 (four) hours as needed for moderate pain. Patient not taking: Reported on 02/03/2022 01/29/22   Saverio Danker, PA-C     Family History  Problem Relation Age of Onset   Cancer Sister        bone cancer L leg with mets to lung, diagnosed age 14; s/p amputation   Hyperlipidemia Brother    Heart disease Brother 52       CABG   Cancer Sister 86       breast cancer and lung cancer (lung dx'd first)   Rheum arthritis Daughter    Gout Son     Social History   Socioeconomic History   Marital status: Widowed    Spouse name: Not on file   Number of children: 2   Years of education: Not on file   Highest education level: Not on file  Occupational History   Occupation: Retired Psychologist, prison and probation services  Tobacco Use   Smoking status: Never   Smokeless tobacco: Never  Vaping Use   Vaping Use: Never used  Substance and Sexual Activity   Alcohol use: Yes    Comment: occasional glass of wine.   Drug use: No   Sexual activity: Not Currently  Other Topics Concern   Not on file  Social History Narrative   Widowed. Lives 1/2 the year in [place]. Her husband (retired Tax adviser) died from liver cancer in 04/16/11.  Daughter lives in [place]; 2 grandchildren here, and 2 in [place] Strain: Not on file  Food Insecurity: No Food Insecurity (02/03/2022)   Hunger Vital Sign    Worried About Running Out of Food in the Last Year: Never true    Ran Out of Food in the Last Year: Never true  Transportation Needs: No Transportation Needs (02/03/2022)   PRAPARE - Hydrologist (Medical): No    Lack of Transportation (Non-Medical): No  Physical  Activity: Not on file  Stress: Not on file  Social Connections: Not on file      Review of Systems see above  Vital Signs: BP (!) 148/80 (BP Location: Left Arm)   Pulse 95   Temp 97.8 F (36.6 C) (Oral)   Resp 16   Ht '4\' 10"'$  (1.473 m)   Wt 125 lb (56.7 kg)   SpO2 90%   BMI 26.13 kg/m      Physical Exam awake, alert.  Hard of hearing. Chest with sl dim BS bases; heart- irreg irreg; abd- soft,sl dist, few BS, intact right abdominal surgical drain with small amount of reddish-brown fluid in JP bulb, some diffuse abdominal tenderness to palpation; ext with FROM  Imaging: CT Abdomen Pelvis W Contrast  Result Date: 02/03/2022 CLINICAL DATA:  Postoperative abdominal pain, status post cholecystectomy, persistent pain, no drainage from drainage catheter EXAM: CT ABDOMEN AND PELVIS WITH CONTRAST TECHNIQUE: Multidetector CT imaging of the abdomen and pelvis was performed using the standard protocol following bolus administration of intravenous contrast. RADIATION DOSE REDUCTION: This exam was performed according to the departmental dose-optimization program which includes automated exposure control, adjustment of the mA and/or kV according to patient size and/or use of iterative reconstruction technique. CONTRAST:  128m OMNIPAQUE IOHEXOL 300 MG/ML SOLN, additional oral enteric contrast COMPARISON:  01/27/2022 FINDINGS: Lower chest: Small right, trace left pleural effusions with associated atelectasis or consolidation. Cardiomegaly and coronary artery calcifications. Aortic valve calcifications. Hepatobiliary: No focal liver abnormality is seen. Status post cholecystectomy. Intra and extrahepatic biliary ductal dilatation, the common bile duct measuring up to 1.0 cm (series 5, image 53). Surgical drain is positioned in the gallbladder fossa with a small air and fluid collection in the gallbladder fossa measuring 2.7 x 1.8 cm (series 2, image 31). Additional small fluid collections about the right  lobe of the liver, laterally measuring 5.7 x 2.0 cm (series 2, image 36) and anteriorly measuring 5.0 x 1.2 cm (series 2, image 36). Pancreas: Unremarkable. No pancreatic ductal dilatation or surrounding inflammatory changes. Spleen: Normal in size without significant abnormality. Adrenals/Urinary Tract: Adrenal glands are unremarkable. Kidneys are normal, without renal calculi, solid lesion, or hydronephrosis. Bladder is unremarkable. Stomach/Bowel: Stomach is within normal limits. Appendix appears normal. Wall thickening of the hepatic flexure and transverse colon in the right upper quadrant (series 2, image 38). Vascular/Lymphatic: Aortic atherosclerosis. No enlarged abdominal or pelvic lymph nodes. Reproductive: No mass or other significant abnormality. Other: No abdominal wall hernia or abnormality. No ascites. Musculoskeletal: No acute or significant  osseous findings. IMPRESSION: 1. Status post cholecystectomy. Surgical drain is positioned in the gallbladder fossa. 2. Multiple air and fluid collections in the gallbladder fossa and about the right lobe of the liver, concerning for postoperative infection. The presence or absence of infection within these collections is however not established by CT. 3. Intra and extrahepatic biliary ductal dilatation, the common bile duct measuring up to 1.0 cm. 4. Wall thickening of the hepatic flexure and transverse colon in the right upper quadrant, likely reactive to adjacent inflammation. 5. Small right, trace left pleural effusions with associated atelectasis or consolidation. 6. Cardiomegaly and coronary artery disease. Aortic Atherosclerosis (ICD10-I70.0). Electronically Signed   By: Delanna Ahmadi M.D.   On: 02/03/2022 13:23   CT Abdomen Pelvis Wo Contrast  Result Date: 01/27/2022 CLINICAL DATA:  Epigastric pain.  Fever. EXAM: CT ABDOMEN AND PELVIS WITHOUT CONTRAST TECHNIQUE: Multidetector CT imaging of the abdomen and pelvis was performed following the standard  protocol without IV contrast. RADIATION DOSE REDUCTION: This exam was performed according to the departmental dose-optimization program which includes automated exposure control, adjustment of the mA and/or kV according to patient size and/or use of iterative reconstruction technique. COMPARISON:  None Available. FINDINGS: Lower chest: Bibasilar atelectasis.  Enlarged cardiac contours. Hepatobiliary: Liver has a normal contour. There is mild periportal edema. No definite evidence of intrahepatic biliary ductal dilatation. Lack of IV contrast markedly limits the ability to assess for focal liver lesions. The gallbladder is markedly enlarged with multiple layering gallstones. There is also evidence of pericholecystic fat stranding (series 5, image 42) there is marked gallbladder wall thickening with air within the gallbladder wall. There also appears to be a defect in the wall of the gallbladder (series 2, image 35). Findings are worrisome for emphysematous and possibly gangrenous cholecystitis. Common bile duct is normal in size measuring up to 6 mm. Pancreas: Unremarkable. No pancreatic ductal dilatation or surrounding inflammatory changes. Spleen: Normal in size without focal abnormality. Adrenals/Urinary Tract: Adrenal glands are unremarkable. Kidneys are normal, without renal calculi, focal lesion, or hydronephrosis. Bladder is unremarkable. Stomach/Bowel: Stomach, small bowel, large bowel are normal in caliber. There is no evidence of bowel obstruction. There is no evidence of focal wall thickening. Vascular/Lymphatic: Severe atherosclerotic plaque is seen in the abdominal and pelvic vasculature. Reproductive: Uterus and bilateral adnexa are unremarkable. Other: No abdominal wall hernia or abnormality. No abdominopelvic ascites. Musculoskeletal: No acute or significant osseous findings. Right hip arthroplasty. IMPRESSION: Findings are worrisome for emphysematous and gangrenous cholecystitis. Recommend surgical  consultation. Findings were discussed with Dr. Redmond School on 01/27/22 at 11:00 AM. Electronically Signed   By: Marin Roberts M.D.   On: 01/27/2022 11:05    Labs:  CBC: Recent Labs    01/27/22 1306 01/28/22 0428 02/03/22 1102 02/04/22 0649  WBC 20.1* 14.0* 19.2* 17.7*  HGB 11.9* 10.8* 10.9* 9.8*  HCT 36.2 32.8* 33.9* 30.1*  PLT 364 341 539* 447*    COAGS: Recent Labs    02/04/22 0934  INR 1.2    BMP: Recent Labs    01/27/22 1306 01/28/22 0428 02/03/22 1102 02/04/22 0649  NA 137 135 141 139  K 3.7 4.0 4.0 3.5  CL 102 104 102 104  CO2 '26 24 27 25  '$ GLUCOSE 116* 225* 113* 69*  BUN 14 10 7* 7*  CALCIUM 8.9 8.0* 8.8* 7.9*  CREATININE 0.83 0.77 0.56 0.54  GFRNONAA >60 >60 >60 >60    LIVER FUNCTION TESTS: Recent Labs    01/27/22 1306 01/28/22 0428 02/03/22  1102 02/04/22 0649  BILITOT 1.1 0.6 0.8 1.1  AST 44* 62* 31 25  ALT 41 50* 34 26  ALKPHOS 118 90 89 70  PROT 6.3* 5.4* 5.8* 5.0*  ALBUMIN 2.8* 2.3* 2.3* 1.9*    TUMOR MARKERS: No results for input(s): "AFPTM", "CEA", "CA199", "CHROMGRNA" in the last 8760 hours.  Assessment and Plan: 88 y.o. female with past medical history significant for arthritis, skin cancer, hypertension, diverticulosis, migraine headaches, PAC's, hyperlipidemia who was recently admitted to the surgical team from 10/31 until 11/2 for acute gangrenous cholecystitis.  She underwent laparoscopic cholecystectomy with JP drain placement on 10/31 by Dr. Thermon Leyland and discharged home on antibiotics on 11/2. She presented to [facility] ED on 11/7 with persistent abdominal pain, minimal surgical drain output. CT scan performed yesterday revealed:  . Status post cholecystectomy. Surgical drain is positioned in the gallbladder fossa. 2. Multiple air and fluid collections in the gallbladder fossa and about the right lobe of the liver, concerning for postoperative infection. The presence or absence of infection within these collections is however  not established by CT. 3. Intra and extrahepatic biliary ductal dilatation, the common bile duct measuring up to 1.0 cm. 4. Wall thickening of the hepatic flexure and transverse colon in the right upper quadrant, likely reactive to adjacent inflammation. 5. Small right, trace left pleural effusions with associated atelectasis or consolidation. 6. Cardiomegaly and coronary artery disease.   Aortic Atherosclerosis  Currently afebrile, WBC 17.7, hemoglobin 9.8, platelets 447K, creatinine normal, PT/INR normal; prior cultures grew E. coli and Klebsiella; request now received from surgical team for hepatic abscess drain placement.  Imaging studies have been reviewed by Dr. Serafina Royals.Risks and benefits discussed with the patient/son including bleeding, infection, damage to adjacent structures, bowel perforation/fistula connection, and sepsis.  All of the patient's questions were answered, patient is agreeable to proceed. Consent signed and in chart.  Procedure scheduled for today  Thank you for this interesting consult.  I greatly enjoyed meeting Alexis Hines and look forward to participating in their care.  A copy of this report was sent to the requesting provider on this date.  Electronically Signed: D. Rowe Robert, PA-C 02/04/2022, 10:44 AM   I spent a total of  25 minutes   in face to face in clinical consultation, greater than 50% of which was counseling/coordinating care for image guided drainage of hepatic abscess

## 2022-02-04 NOTE — Progress Notes (Signed)
PROGRESS NOTE    Alexis Hines  IZT:[id number] DOB: 04/13/1933 DOA: 02/03/2022 PCP: Rita Ohara, MD   Brief Narrative: Alexis Hines is a 88 y.o. female with a history of osteoarthritis, basal cell cancer of the nose, diverticulosis, migraine headaches, paroxysmal SVT, grade 1 diastolic dysfunction who presented secondary to decreased JP drain output and worsening abdominal pain.  She was found to have evidence of postoperative infection which appears to be secondary to inadequate coverage of bacterial infection.  Culture results.  General surgery consulted on admission.  Patient started on Zosyn IV for coverage.  Interventional radiology consulted for liver abscess drain placement.   Assessment and Plan:  Postoperative infection In setting of recent laparoscopic cholecystectomy.  Patient was discharged on Augmentin, however intraoperative liver abscess culture significant for sensitive E. coli.  and ampicillin/Unasyn/cefazolin resistant Klebsiella pneumonia.  General surgery consulted on admission.  Patient started on Zosyn for management.  Surgery recommending interventional radiology for liver drain. -Continue Zosyn IVF for now -General surgery recommendations: IR consult for drainage -Interventional radiology: CT-guided liver abscess drain placement  Leukocytosis Blood cell count of 19,200 on admission.  Secondary to postoperative infection.  Slight improvement of white blood cell count to 17,700 on CBC this morning.  No associated fevers.  Rales Patient appears to be asymptomatic at this time.  History of diastolic heart failure. -Chest x-ray  Hypercholesterolemia Patient is on Lipitor as an outpatient which was held on admission. -Resume Lipitor 40 mg daily  Paroxysmal SVT Patient is managed on Toprol-XL 50 mg daily.  Currently rate controlled.  Started on metoprolol tartrate IV while inpatient. -Resume home Toprol-XL 50 mg daily  Chronic diastolic heart  failure Patient is managed on Toprol-XL as an outpatient.  Patient is not on ACEi/ARB.  Patient with evidence of possible rales in addition to edema. -Strict in and out and daily weights  Hypoalbuminemia Unsure if patient has malnutrition based off of albumin levels. -Dietitian consult for assessment  Normocytic anemia Slight drift.  In setting of recent surgery.   DVT prophylaxis: SCDs Code Status:   Code Status: Full Code Family Communication: None at bedside Disposition Plan: Discharge home pending general surgery recommendations   Consultants:  General surgery Interventional radiology  Procedures:  None  Antimicrobials: Zosyn IV   Subjective: Patient reports no issues overnight.  Abdominal pain is managed with current analgesic therapy.  Objective: BP (!) 104/49 (BP Location: Left Arm)   Pulse 84   Temp 97.8 F (36.6 C) (Oral)   Resp 15   Ht '4\' 10"'$  (1.473 m)   Wt 56.7 kg   SpO2 98%   BMI 26.13 kg/m   Examination:  General exam: Appears calm and comfortable Respiratory system: Rales on auscultation. Respiratory effort normal. Cardiovascular system: S1 & S2 heard, RRR. No murmurs, rubs, gallops or clicks. Gastrointestinal system: Abdomen is nondistended, soft and nontender. No organomegaly or masses felt. Normal bowel sounds heard. Central nervous system: Alert and oriented. No focal neurological deficits. Musculoskeletal: No edema. No calf tenderness Skin: No cyanosis. No rashes Psychiatry: Judgement and insight appear normal. Mood & affect appropriate.    Data Reviewed: I have personally reviewed following labs and imaging studies  CBC Lab Results  Component Value Date   WBC 17.7 (H) 02/04/2022   RBC 3.25 (L) 02/04/2022   HGB 9.8 (L) 02/04/2022   HCT 30.1 (L) 02/04/2022   MCV 92.6 02/04/2022   MCH 30.2 02/04/2022   PLT 447 (H) 02/04/2022   MCHC 32.6 02/04/2022  RDW 14.9 02/04/2022   LYMPHSABS 1.5 02/03/2022   MONOABS 1.8 (H) 02/03/2022    EOSABS 0.1 02/03/2022   BASOSABS 0.1 58/52/7782     Last metabolic panel Lab Results  Component Value Date   NA 139 02/04/2022   K 3.5 02/04/2022   CL 104 02/04/2022   CO2 25 02/04/2022   BUN 7 (L) 02/04/2022   CREATININE 0.54 02/04/2022   GLUCOSE 69 (L) 02/04/2022   GFRNONAA >60 02/04/2022   GFRAA 78 11/09/2019   CALCIUM 7.9 (L) 02/04/2022   PROT 5.0 (L) 02/04/2022   ALBUMIN 1.9 (L) 02/04/2022   LABGLOB 2.3 01/26/2022   AGRATIO 1.5 01/26/2022   BILITOT 1.1 02/04/2022   ALKPHOS 70 02/04/2022   AST 25 02/04/2022   ALT 26 02/04/2022   ANIONGAP 10 02/04/2022    GFR: Estimated Creatinine Clearance: 37.6 mL/min (by C-G formula based on SCr of 0.54 mg/dL).  Recent Results (from the past 240 hour(s))  Urine Culture     Status: Abnormal   Collection Time: 01/27/22  8:17 AM   Specimen: Urine   UR  Result Value Ref Range Status   Urine Culture, Routine Final report (A)  Final   Organism ID, Bacteria Escherichia coli (A)  Final    Comment: Cefazolin with an MIC <=16 predicts susceptibility to the oral agents cefaclor, cefdinir, cefpodoxime, cefprozil, cefuroxime, cephalexin, and loracarbef when used for therapy of uncomplicated urinary tract infections due to E. coli, Klebsiella pneumoniae, and Proteus mirabilis. 10,000-25,000 colony forming units per mL    Antimicrobial Susceptibility Comment  Final    Comment:       ** S = Susceptible; I = Intermediate; R = Resistant **                    P = Positive; N = Negative             MICS are expressed in micrograms per mL    Antibiotic                 RSLT#1    RSLT#2    RSLT#3    RSLT#4 Amoxicillin/Clavulanic Acid    R Ampicillin                     R Cefazolin                      S Cefepime                       S Ceftriaxone                    S Cefuroxime                     I Ciprofloxacin                  S Ertapenem                      S Gentamicin                     S Imipenem                        S Levofloxacin                   S Meropenem  S Nitrofurantoin                 S Piperacillin/Tazobactam        S Tetracycline                   S Tobramycin                     S Trimethoprim/Sulfa             S   Aerobic/Anaerobic Culture w Gram Stain (surgical/deep wound)     Status: None   Collection Time: 01/27/22  2:04 PM   Specimen: PATH Other; Tissue  Result Value Ref Range Status   Specimen Description   Final    ABSCESS LIVER Performed at [facility], [place] [address] [place], [place] [address]    Special Requests   Final    NONE Performed at [facility], [place] [address] [place], [place] [address]    Gram Stain   Final    MODERATE WBC PRESENT,BOTH PMN AND MONONUCLEAR FEW GRAM NEGATIVE RODS    Culture   Final    ABUNDANT ESCHERICHIA COLI ABUNDANT KLEBSIELLA PNEUMONIAE NO ANAEROBES ISOLATED Performed at [facility], [address], [place], [place] [address]    Report Status 02/01/2022 FINAL  Final   Organism ID, Bacteria ESCHERICHIA COLI  Final   Organism ID, Bacteria KLEBSIELLA PNEUMONIAE  Final      Susceptibility   Escherichia coli - MIC*    AMPICILLIN <=2 SENSITIVE Sensitive     CEFAZOLIN <=4 SENSITIVE Sensitive     CEFEPIME <=0.12 SENSITIVE Sensitive     CEFTAZIDIME <=1 SENSITIVE Sensitive     CEFTRIAXONE <=0.25 SENSITIVE Sensitive     CIPROFLOXACIN <=0.25 SENSITIVE Sensitive     GENTAMICIN <=1 SENSITIVE Sensitive     IMIPENEM <=0.25 SENSITIVE Sensitive     TRIMETH/SULFA <=20 SENSITIVE Sensitive     AMPICILLIN/SULBACTAM <=2 SENSITIVE Sensitive     PIP/TAZO <=4 SENSITIVE Sensitive     * ABUNDANT ESCHERICHIA COLI   Klebsiella pneumoniae - MIC*    AMPICILLIN >=32 RESISTANT Resistant     CEFAZOLIN >=64 RESISTANT Resistant     CEFEPIME <=0.12 SENSITIVE Sensitive     CEFTAZIDIME <=1 SENSITIVE Sensitive     CEFTRIAXONE <=0.25 SENSITIVE Sensitive     CIPROFLOXACIN <=0.25 SENSITIVE Sensitive      GENTAMICIN <=1 SENSITIVE Sensitive     IMIPENEM 0.5 SENSITIVE Sensitive     TRIMETH/SULFA <=20 SENSITIVE Sensitive     AMPICILLIN/SULBACTAM >=32 RESISTANT Resistant     PIP/TAZO <=4 SENSITIVE Sensitive     * ABUNDANT KLEBSIELLA PNEUMONIAE      Radiology Studies: CT Abdomen Pelvis W Contrast  Result Date: 02/03/2022 CLINICAL DATA:  Postoperative abdominal pain, status post cholecystectomy, persistent pain, no drainage from drainage catheter EXAM: CT ABDOMEN AND PELVIS WITH CONTRAST TECHNIQUE: Multidetector CT imaging of the abdomen and pelvis was performed using the standard protocol following bolus administration of intravenous contrast. RADIATION DOSE REDUCTION: This exam was performed according to the departmental dose-optimization program which includes automated exposure control, adjustment of the mA and/or kV according to patient size and/or use of iterative reconstruction technique. CONTRAST:  15m OMNIPAQUE IOHEXOL 300 MG/ML SOLN, additional oral enteric contrast COMPARISON:  01/27/2022 FINDINGS: Lower chest: Small right, trace left pleural effusions with associated atelectasis or consolidation. Cardiomegaly and coronary artery calcifications. Aortic valve calcifications. Hepatobiliary: No focal liver abnormality is seen. Status post cholecystectomy. Intra  and extrahepatic biliary ductal dilatation, the common bile duct measuring up to 1.0 cm (series 5, image 53). Surgical drain is positioned in the gallbladder fossa with a small air and fluid collection in the gallbladder fossa measuring 2.7 x 1.8 cm (series 2, image 31). Additional small fluid collections about the right lobe of the liver, laterally measuring 5.7 x 2.0 cm (series 2, image 36) and anteriorly measuring 5.0 x 1.2 cm (series 2, image 36). Pancreas: Unremarkable. No pancreatic ductal dilatation or surrounding inflammatory changes. Spleen: Normal in size without significant abnormality. Adrenals/Urinary Tract: Adrenal glands are  unremarkable. Kidneys are normal, without renal calculi, solid lesion, or hydronephrosis. Bladder is unremarkable. Stomach/Bowel: Stomach is within normal limits. Appendix appears normal. Wall thickening of the hepatic flexure and transverse colon in the right upper quadrant (series 2, image 38). Vascular/Lymphatic: Aortic atherosclerosis. No enlarged abdominal or pelvic lymph nodes. Reproductive: No mass or other significant abnormality. Other: No abdominal wall hernia or abnormality. No ascites. Musculoskeletal: No acute or significant osseous findings. IMPRESSION: 1. Status post cholecystectomy. Surgical drain is positioned in the gallbladder fossa. 2. Multiple air and fluid collections in the gallbladder fossa and about the right lobe of the liver, concerning for postoperative infection. The presence or absence of infection within these collections is however not established by CT. 3. Intra and extrahepatic biliary ductal dilatation, the common bile duct measuring up to 1.0 cm. 4. Wall thickening of the hepatic flexure and transverse colon in the right upper quadrant, likely reactive to adjacent inflammation. 5. Small right, trace left pleural effusions with associated atelectasis or consolidation. 6. Cardiomegaly and coronary artery disease. Aortic Atherosclerosis (ICD10-I70.0). Electronically Signed   By: Delanna Ahmadi M.D.   On: 02/03/2022 13:23      LOS: 1 day    Cordelia Poche, MD [facility] 02/04/2022, 12:02 PM   If 7PM-7AM, please contact night-coverage [email or web address]

## 2022-02-04 NOTE — Hospital Course (Addendum)
Alexis Hines is a 88 y.o. female with a history of osteoarthritis, basal cell cancer of the nose, diverticulosis, migraine headaches, paroxysmal SVT, grade 1 diastolic dysfunction who presented secondary to decreased JP drain output and worsening abdominal pain.  She was found to have evidence of postoperative infection which appears to be secondary to inadequate coverage of bacterial infection.  Culture results.  General surgery consulted on admission.  Patient started on Zosyn IV for coverage.  Interventional radiology consulted for liver abscess drain placement. Leukocytosis stable. Plan for repeat CT abdomen/pelvis.

## 2022-02-04 NOTE — Progress Notes (Signed)
Patients son Terrial Rhodes would like to be updated his telephone number is [phone]

## 2022-02-04 NOTE — Progress Notes (Signed)
Progress Note     Subjective: Admission HPI : 88 year old female with history of hypertension, hyperlipidemia who was recently admitted to our service from 10/31-11/2 for acute gangrenous cholecystitis.  She underwent laparoscopic cholecystectomy with JP drain placement on 10/31 by Dr. Thermon Leyland.  She was discharged from that admission tolerating a diet, pain controlled, drain output serosanguineous.  She was on Zosyn during admission and was discharged on Augmentin to complete a 10-day course total.  Since discharge she said she was initially doing well with pain control and drain was with consistent serosanguineous output. She has been compliant with antibiotic. Over the last few days she has had worsening abdominal pain up to 8/10 which is not improved with oxycodone.  She has not been taking ibuprofen or Tylenol.  She has not noticed drainage into her JP for the last several days.  Today she noted a temperature of 99 F and due to this and ongoing abdominal pain she called our office and we recommended she proceed to the emergency department for further evaluation. She has decreased appetite but has been tolerating a diet without nausea or vomiting. She has been having daily loose bowel movements and took imodium last night. General surgery asked to see due to her ongoing pain, elevated WBC, and recent surgery.  This more she reports she is feeling a bit better, denies abdominal pain but does have a headache.  Objective: Vital signs in last 24 hours: Temp:  [97.5 F (36.4 C)-98.3 F (36.8 C)] 97.8 F (36.6 C) (11/08 0606) Pulse Rate:  [56-108] 89 (11/08 0606) Resp:  [14-18] 14 (11/08 0606) BP: (96-154)/(63-87) 145/69 (11/08 0606) SpO2:  [91 %-98 %] 91 % (11/08 0606) Weight:  [56.7 kg] 56.7 kg (11/07 0942) Last BM Date : 02/03/22  Intake/Output from previous day: 11/07 0701 - 11/08 0700 In: 1475.5 [I.V.:847.5; IV Piggyback:628] Out: 400 [Urine:400] Intake/Output this shift: Total  I/O In: 174.4 [I.V.:152.5; IV Piggyback:21.9] Out: -   PE: General: pleasant, WD, female who is laying in bed in NAD HEENT: head is normocephalic, atraumatic.  Sclera are noninjected.  Pupils equal and round. EOMs intact.  Ears and nose without any masses or lesions.  Mouth is pink and moist Heart: regular, rate, and rhythm.  Palpable radial pulses bilaterally Lungs: Respiratory effort nonlabored on room air Abd: soft, +BS, no masses, hernias, or organomegaly. Mild distension. Moderate TTP over incision and RUQ. Greatest in RUQ. No rebound or guarding. Incisions with c/d/I with surgical glue and mild surrounding ecchymosis. No erythema, induration, purulent drainage. JP drain with old serosanguineous output.  MSK: all 4 extremities are symmetrical with no cyanosis, clubbing, or edema. Skin: warm and dry with no masses, lesions, or rashes. Calves soft and nontender Neuro: Cranial nerves 2-12 grossly intact, sensation is normal throughout Psych: A&Ox3 with an appropriate affect.    Lab Results:  Recent Labs    02/03/22 1102 02/04/22 0649  WBC 19.2* 17.7*  HGB 10.9* 9.8*  HCT 33.9* 30.1*  PLT 539* 447*    BMET Recent Labs    02/03/22 1102 02/04/22 0649  NA 141 139  K 4.0 3.5  CL 102 104  CO2 27 25  GLUCOSE 113* 69*  BUN 7* 7*  CREATININE 0.56 0.54  CALCIUM 8.8* 7.9*    PT/INR No results for input(s): "LABPROT", "INR" in the last 72 hours. CMP     Component Value Date/Time   NA 139 02/04/2022 0649   NA 135 01/26/2022 1202   K 3.5 02/04/2022  0649   CL 104 02/04/2022 0649   CO2 25 02/04/2022 0649   GLUCOSE 69 (L) 02/04/2022 0649   BUN 7 (L) 02/04/2022 0649   BUN 11 01/26/2022 1202   CREATININE 0.54 02/04/2022 0649   CALCIUM 7.9 (L) 02/04/2022 0649   PROT 5.0 (L) 02/04/2022 0649   PROT 5.8 (L) 01/26/2022 1202   ALBUMIN 1.9 (L) 02/04/2022 0649   ALBUMIN 3.5 (L) 01/26/2022 1202   AST 25 02/04/2022 0649   ALT 26 02/04/2022 0649   ALKPHOS 70 02/04/2022 0649    BILITOT 1.1 02/04/2022 0649   BILITOT 0.9 01/26/2022 1202   GFRNONAA >60 02/04/2022 0649   GFRAA 78 11/09/2019 0947   Lipase     Component Value Date/Time   LIPASE 25 02/03/2022 1102       Studies/Results: CT Abdomen Pelvis W Contrast  Result Date: 02/03/2022 CLINICAL DATA:  Postoperative abdominal pain, status post cholecystectomy, persistent pain, no drainage from drainage catheter EXAM: CT ABDOMEN AND PELVIS WITH CONTRAST TECHNIQUE: Multidetector CT imaging of the abdomen and pelvis was performed using the standard protocol following bolus administration of intravenous contrast. RADIATION DOSE REDUCTION: This exam was performed according to the departmental dose-optimization program which includes automated exposure control, adjustment of the mA and/or kV according to patient size and/or use of iterative reconstruction technique. CONTRAST:  170m OMNIPAQUE IOHEXOL 300 MG/ML SOLN, additional oral enteric contrast COMPARISON:  01/27/2022 FINDINGS: Lower chest: Small right, trace left pleural effusions with associated atelectasis or consolidation. Cardiomegaly and coronary artery calcifications. Aortic valve calcifications. Hepatobiliary: No focal liver abnormality is seen. Status post cholecystectomy. Intra and extrahepatic biliary ductal dilatation, the common bile duct measuring up to 1.0 cm (series 5, image 53). Surgical drain is positioned in the gallbladder fossa with a small air and fluid collection in the gallbladder fossa measuring 2.7 x 1.8 cm (series 2, image 31). Additional small fluid collections about the right lobe of the liver, laterally measuring 5.7 x 2.0 cm (series 2, image 36) and anteriorly measuring 5.0 x 1.2 cm (series 2, image 36). Pancreas: Unremarkable. No pancreatic ductal dilatation or surrounding inflammatory changes. Spleen: Normal in size without significant abnormality. Adrenals/Urinary Tract: Adrenal glands are unremarkable. Kidneys are normal, without renal calculi,  solid lesion, or hydronephrosis. Bladder is unremarkable. Stomach/Bowel: Stomach is within normal limits. Appendix appears normal. Wall thickening of the hepatic flexure and transverse colon in the right upper quadrant (series 2, image 38). Vascular/Lymphatic: Aortic atherosclerosis. No enlarged abdominal or pelvic lymph nodes. Reproductive: No mass or other significant abnormality. Other: No abdominal wall hernia or abnormality. No ascites. Musculoskeletal: No acute or significant osseous findings. IMPRESSION: 1. Status post cholecystectomy. Surgical drain is positioned in the gallbladder fossa. 2. Multiple air and fluid collections in the gallbladder fossa and about the right lobe of the liver, concerning for postoperative infection. The presence or absence of infection within these collections is however not established by CT. 3. Intra and extrahepatic biliary ductal dilatation, the common bile duct measuring up to 1.0 cm. 4. Wall thickening of the hepatic flexure and transverse colon in the right upper quadrant, likely reactive to adjacent inflammation. 5. Small right, trace left pleural effusions with associated atelectasis or consolidation. 6. Cardiomegaly and coronary artery disease. Aortic Atherosclerosis (ICD10-I70.0). Electronically Signed   By: ADelanna AhmadiM.D.   On: 02/03/2022 13:23    Anti-infectives: Anti-infectives (From admission, onward)    Start     Dose/Rate Route Frequency Ordered Stop   02/03/22 1800  piperacillin-tazobactam (ZOSYN) IVPB  3.375 g        3.375 g 12.5 mL/hr over 240 Minutes Intravenous Every 8 hours 02/03/22 1519     02/03/22 1515  piperacillin-tazobactam (ZOSYN) IVPB 3.375 g  Status:  Discontinued        3.375 g 100 mL/hr over 30 Minutes Intravenous Every 8 hours 02/03/22 1513 02/03/22 1519   02/03/22 1130  piperacillin-tazobactam (ZOSYN) IVPB 3.375 g        3.375 g 100 mL/hr over 30 Minutes Intravenous  Once 02/03/22 1127 02/03/22 1231         Assessment/Plan Abdominal pain, leukocytosis (WBC 19.2) POD8 status post laparoscopic cholecystectomy with JP drain placement 10/31 Dr. Thermon Leyland for gangrenous cholecystitis with intra-op finding of liver abscess -WBC elevated to 19 on antibiotic therapy (Augmentin) and was 14 on discharge; improving with change in therapy to Zosyn - intra-op culture of liver abscess 10/31 with e coli and klebsiella with resistance to ampicillin. Sensitive to zosyn. She was not adequately covered on augmentin. Continue zosyn for now but plan to transition to alternative oral coverage at time of discharge -CT with additional fluid collections noted, IR consult pending for possible drainage  FEN: NPO ID: [email or web address] VTE: none currently   LOS: 1 day   Clovis Riley, [facility] 02/04/2022, 8:41 AM Please see Amion for pager number during day hours 7:00am-4:30pm

## 2022-02-04 NOTE — Procedures (Signed)
Interventional Radiology Procedure Note  Procedure: CT guided liver abscess drain placement  Findings: Please refer to procedural dictation for full description. 10.2 Fr pigtail drain placed in right subcapsular abscess.  Approximately 10 mL sanguinopurulent fluid aspirated and sent for culture.  Drain placed to bulb suction.  Complications: None immediate  Estimated Blood Loss: < 5 mL  Recommendations: Keep to bulb suction for now.  Follow up culture results. IR will follow.   Ruthann Cancer, MD

## 2022-02-05 DIAGNOSIS — E440 Moderate protein-calorie malnutrition: Secondary | ICD-10-CM | POA: Insufficient documentation

## 2022-02-05 DIAGNOSIS — I471 Supraventricular tachycardia, unspecified: Secondary | ICD-10-CM | POA: Diagnosis not present

## 2022-02-05 DIAGNOSIS — D649 Anemia, unspecified: Secondary | ICD-10-CM | POA: Diagnosis not present

## 2022-02-05 DIAGNOSIS — I5189 Other ill-defined heart diseases: Secondary | ICD-10-CM | POA: Diagnosis not present

## 2022-02-05 DIAGNOSIS — T8140XA Infection following a procedure, unspecified, initial encounter: Secondary | ICD-10-CM | POA: Diagnosis not present

## 2022-02-05 LAB — CBC
HCT: 33.6 % — ABNORMAL LOW (ref 36.0–46.0)
Hemoglobin: 10.9 g/dL — ABNORMAL LOW (ref 12.0–15.0)
MCH: 29.6 pg (ref 26.0–34.0)
MCHC: 32.4 g/dL (ref 30.0–36.0)
MCV: 91.3 fL (ref 80.0–100.0)
Platelets: 439 10*3/uL — ABNORMAL HIGH (ref 150–400)
RBC: 3.68 MIL/uL — ABNORMAL LOW (ref 3.87–5.11)
RDW: 14.6 % (ref 11.5–15.5)
WBC: 18.7 10*3/uL — ABNORMAL HIGH (ref 4.0–10.5)
nRBC: 0 % (ref 0.0–0.2)

## 2022-02-05 MED ORDER — ACETAMINOPHEN 500 MG PO TABS: 1000.0000 mg | ORAL_TABLET | Freq: Four times a day (QID) | ORAL | Status: DC | PRN

## 2022-02-05 MED ORDER — LIDOCAINE 5 % EX PTCH
1.0000 | MEDICATED_PATCH | CUTANEOUS | Status: DC
  Administered 2022-02-05 – 2022-02-11 (×7): 1 via TRANSDERMAL
  Filled 2022-02-05 (×7): qty 1

## 2022-02-05 MED ORDER — ACETAMINOPHEN 500 MG PO TABS
1000.0000 mg | ORAL_TABLET | Freq: Four times a day (QID) | ORAL | Status: DC
  Administered 2022-02-05 – 2022-02-11 (×16): 1000 mg via ORAL
  Filled 2022-02-05 (×21): qty 2

## 2022-02-05 MED ORDER — ACETAMINOPHEN 650 MG RE SUPP: 650.0000 mg | Freq: Four times a day (QID) | RECTAL | Status: DC | PRN

## 2022-02-05 MED ORDER — DOCUSATE SODIUM 100 MG PO CAPS
100.0000 mg | ORAL_CAPSULE | Freq: Two times a day (BID) | ORAL | Status: DC
  Administered 2022-02-05: 100 mg via ORAL
  Filled 2022-02-05: qty 1

## 2022-02-05 MED ORDER — OXYCODONE HCL 5 MG PO TABS
5.0000 mg | ORAL_TABLET | ORAL | Status: DC | PRN
  Filled 2022-02-05: qty 1

## 2022-02-05 MED ORDER — ENSURE ENLIVE PO LIQD
237.0000 mL | Freq: Two times a day (BID) | ORAL | Status: DC
  Administered 2022-02-05 – 2022-02-06 (×2): 237 mL via ORAL

## 2022-02-05 MED ORDER — BISACODYL 10 MG RE SUPP: 10.0000 mg | Freq: Every day | RECTAL | Status: DC | PRN

## 2022-02-05 MED ORDER — POLYETHYLENE GLYCOL 3350 17 G PO PACK: 17.0000 g | PACK | Freq: Every day | ORAL | Status: DC | PRN

## 2022-02-05 MED ORDER — IBUPROFEN 200 MG PO TABS
400.0000 mg | ORAL_TABLET | Freq: Three times a day (TID) | ORAL | Status: AC
  Administered 2022-02-05 – 2022-02-06 (×6): 400 mg via ORAL
  Filled 2022-02-05 (×6): qty 2

## 2022-02-05 NOTE — Progress Notes (Signed)
Mobility Specialist - Progress Note   02/05/22 0954  Mobility  Activity Ambulated with assistance in hallway  Level of Assistance Standby assist, set-up cues, supervision of patient - no hands on  Assistive Device Front wheel walker  Distance Ambulated (ft) 200 ft  Activity Response Tolerated well  Mobility Referral Yes  $Mobility charge 1 Mobility   Pt received in bed and agreed to mobility, some pain in abdomen when sitting upright. Pt returned to bed with all needs met with alarm on.   Roderick Pee Mobility Specialist

## 2022-02-05 NOTE — Progress Notes (Signed)
Initial Nutrition Assessment  DOCUMENTATION CODES:   Non-severe (moderate) malnutrition in context of acute illness/injury  INTERVENTION:   -Ensure Plus High Protein po BID, each supplement provides 350 kcal and 20 grams of protein.   NUTRITION DIAGNOSIS:   Moderate Malnutrition related to acute illness (post-op infection) as evidenced by mild fat depletion, moderate muscle depletion, energy intake < 75% for > 7 days.  GOAL:   Patient will meet greater than or equal to 90% of their needs  MONITOR:   PO intake, Supplement acceptance, Weight trends, Labs, I & O's  REASON FOR ASSESSMENT:   Consult Assessment of nutrition requirement/status  ASSESSMENT:   88 y.o. female with a history of osteoarthritis, basal cell cancer of the nose, diverticulosis, migraine headaches, paroxysmal SVT, grade 1 diastolic dysfunction who presented secondary to decreased JP drain output and worsening abdominal pain.  11/8: s/p CT guided liver abscess drain placement    Patient reports not eating anything for 3 days PTA. Pt has not been eating well since her surgery on 10/31. Pt underwent lap cholecystectomy. Has struggled with constipation, not having regular BMs. Asking about benefiber which we do not have.  Tolerated some toast, OJ and prune juice this morning. Trying to have a BM. She is agreeable to Ensure while PO is poor.   Patient reports UBW of 121 lbs. Current weight: 125 lbs. Pt reports gaining weight and having swelling in her legs.  Medications: Colace  Labs reviewed.  NUTRITION - FOCUSED PHYSICAL EXAM:  Flowsheet Row Most Recent Value  Orbital Region Mild depletion  Upper Arm Region Moderate depletion  Thoracic and Lumbar Region Mild depletion  Buccal Region Mild depletion  Temple Region Mild depletion  Clavicle Bone Region Moderate depletion  Clavicle and Acromion Bone Region Moderate depletion  Scapular Bone Region Moderate depletion  Dorsal Hand Moderate depletion   Patellar Region No depletion  Anterior Thigh Region No depletion  Posterior Calf Region No depletion  Edema (RD Assessment) Mild  [RLE]  Hair Reviewed  Eyes Reviewed  Mouth Reviewed  Skin Reviewed  Nails Unable to assess  [painted]       Diet Order:   Diet Order             Diet regular Room service appropriate? Yes; Fluid consistency: Thin  Diet effective now                   EDUCATION NEEDS:   No education needs have been identified at this time  Skin:  Skin Assessment: Reviewed RN Assessment  Last BM:  11/7  Height:   Ht Readings from Last 1 Encounters:  02/03/22 '4\' 10"'$  (4.196 m)    Weight:   Wt Readings from Last 1 Encounters:  02/03/22 56.7 kg    BMI:  Body mass index is 26.13 kg/m.  Estimated Nutritional Needs:   Kcal:  1500-1700  Protein:  70-80g  Fluid:  1.7L/day  Clayton Bibles, MS, RD, LDN Inpatient Clinical Dietitian Contact information available via Amion

## 2022-02-05 NOTE — Progress Notes (Signed)
Mobility Specialist - Progress Note   02/05/22 1324  Mobility  Activity Ambulated with assistance in hallway  Level of Assistance Standby assist, set-up cues, supervision of patient - no hands on  Assistive Device Front wheel walker  Distance Ambulated (ft) 200 ft  Activity Response Tolerated well  Mobility Referral Yes  $Mobility charge 1 Mobility   Pt received in bed and agreed to mobility, no c/o pain nor discomfort. Pt left in restroom and handed off pt to RN.   Roderick Pee Mobility Specialist

## 2022-02-05 NOTE — Progress Notes (Signed)
PROGRESS NOTE    Alexis Hines  JKK:[id number] DOB: 1933-11-27 DOA: 02/03/2022 PCP: Rita Ohara, MD   Brief Narrative: Alexis Hines is a 88 y.o. female with a history of osteoarthritis, basal cell cancer of the nose, diverticulosis, migraine headaches, paroxysmal SVT, grade 1 diastolic dysfunction who presented secondary to decreased JP drain output and worsening abdominal pain.  She was found to have evidence of postoperative infection which appears to be secondary to inadequate coverage of bacterial infection.  Culture results.  General surgery consulted on admission.  Patient started on Zosyn IV for coverage.  Interventional radiology consulted for liver abscess drain placement.   Assessment and Plan:  Postoperative infection In setting of recent laparoscopic cholecystectomy.  Patient was discharged on Augmentin, however intraoperative liver abscess culture significant for sensitive E. coli. and ampicillin/Unasyn/cefazolin resistant Klebsiella pneumoniae.  CT imaging on admission significant for multiple small liver abscesses. General surgery consulted on admission.  Patient started on Zosyn for management.  Surgery recommending interventional radiology for liver drain which was successfully placed on 11/8. Wound culture (11/8) pending. -Continue Zosyn IVF for now -Consult ID for antibiotic regimen/duration in AM  Leukocytosis Blood cell count of 19,200 on admission.  Secondary to postoperative infection. Stable. No associated fevers.  Rales Patient appears to be asymptomatic at this time.  History of diastolic heart failure. Chest x-ray with some atelectasis. Rales resolved. -Incentive spirometer  Hypercholesterolemia Patient is on Lipitor as an outpatient which was held on admission. -Continue Lipitor 40 mg daily  Paroxysmal SVT Patient is managed on Toprol-XL 50 mg daily.  Currently rate controlled.  Started on metoprolol tartrate IV while inpatient. -Continue Toprol-XL  50 mg daily  Chronic diastolic heart failure Patient is managed on Toprol-XL as an outpatient.  Patient is not on ACEi/ARB.  Patient with evidence of possible rales in addition to edema. -Strict in and out and daily weights  Hypoalbuminemia Unsure if patient has malnutrition based off of albumin levels. -Dietitian consult for assessment  Normocytic anemia Slight drift in setting of recent surgery. Stable.  Thrombocytosis Likely reactive secondary to infection. Slowly improving.   DVT prophylaxis: SCDs Code Status:   Code Status: Full Code Family Communication: None at bedside. Son via telephone. Disposition Plan: Discharge home pending general surgery recommendations   Consultants:  General surgery Interventional radiology  Procedures:  None  Antimicrobials: Zosyn IV   Subjective: Abdominal pain improved. No specific issues overnight. Hoping for me to call her son to update him today.  Objective: BP (!) 145/74 (BP Location: Left Arm)   Pulse 90   Temp 98.6 F (37 C) (Oral)   Resp 16   Ht '4\' 10"'$  (1.473 m)   Wt 56.7 kg   SpO2 90%   BMI 26.13 kg/m   Examination:  General exam: Appears calm and comfortable Respiratory system: Clear to auscultation. Respiratory effort normal. Cardiovascular system: S1 & S2 heard, RRR. Gastrointestinal system: Abdomen is nondistended, soft and nontender. Normal bowel sounds heard. Central nervous system: Alert and oriented. No focal neurological deficits. Musculoskeletal: No edema. No calf tenderness Skin: No cyanosis. No rashes Psychiatry: Judgement and insight appear normal. Mood & affect appropriate.    Data Reviewed: I have personally reviewed following labs and imaging studies  CBC Lab Results  Component Value Date   WBC 18.7 (H) 02/05/2022   RBC 3.68 (L) 02/05/2022   HGB 10.9 (L) 02/05/2022   HCT 33.6 (L) 02/05/2022   MCV 91.3 02/05/2022   MCH 29.6 02/05/2022  PLT 439 (H) 02/05/2022   MCHC 32.4 02/05/2022    RDW 14.6 02/05/2022   LYMPHSABS 1.5 02/03/2022   MONOABS 1.8 (H) 02/03/2022   EOSABS 0.1 02/03/2022   BASOSABS 0.1 86/76/1948     Last metabolic panel Lab Results  Component Value Date   NA 139 02/04/2022   K 3.5 02/04/2022   CL 104 02/04/2022   CO2 25 02/04/2022   BUN 7 (L) 02/04/2022   CREATININE 0.54 02/04/2022   GLUCOSE 69 (L) 02/04/2022   GFRNONAA >60 02/04/2022   GFRAA 78 11/09/2019   CALCIUM 7.9 (L) 02/04/2022   PROT 5.0 (L) 02/04/2022   ALBUMIN 1.9 (L) 02/04/2022   LABGLOB 2.3 01/26/2022   AGRATIO 1.5 01/26/2022   BILITOT 1.1 02/04/2022   ALKPHOS 70 02/04/2022   AST 25 02/04/2022   ALT 26 02/04/2022   ANIONGAP 10 02/04/2022    GFR: Estimated Creatinine Clearance: 37.6 mL/min (by C-G formula based on SCr of 0.54 mg/dL).  Recent Results (from the past 240 hour(s))  Urine Culture     Status: Abnormal   Collection Time: 01/27/22  8:17 AM   Specimen: Urine   UR  Result Value Ref Range Status   Urine Culture, Routine Final report (A)  Final   Organism ID, Bacteria Escherichia coli (A)  Final    Comment: Cefazolin with an MIC <=16 predicts susceptibility to the oral agents cefaclor, cefdinir, cefpodoxime, cefprozil, cefuroxime, cephalexin, and loracarbef when used for therapy of uncomplicated urinary tract infections due to E. coli, Klebsiella pneumoniae, and Proteus mirabilis. 10,000-25,000 colony forming units per mL    Antimicrobial Susceptibility Comment  Final    Comment:       ** S = Susceptible; I = Intermediate; R = Resistant **                    P = Positive; N = Negative             MICS are expressed in micrograms per mL    Antibiotic                 RSLT#1    RSLT#2    RSLT#3    RSLT#4 Amoxicillin/Clavulanic Acid    R Ampicillin                     R Cefazolin                      S Cefepime                       S Ceftriaxone                    S Cefuroxime                     I Ciprofloxacin                  S Ertapenem                       S Gentamicin                     S Imipenem                       S Levofloxacin  S Meropenem                      S Nitrofurantoin                 S Piperacillin/Tazobactam        S Tetracycline                   S Tobramycin                     S Trimethoprim/Sulfa             S   Aerobic/Anaerobic Culture w Gram Stain (surgical/deep wound)     Status: None   Collection Time: 01/27/22  2:04 PM   Specimen: PATH Other; Tissue  Result Value Ref Range Status   Specimen Description   Final    ABSCESS LIVER Performed at [facility], [place] [address], [place], [place] [address]    Special Requests   Final    NONE Performed at [facility], [place] [address], [place], [place] [address]    Gram Stain   Final    MODERATE WBC PRESENT,BOTH PMN AND MONONUCLEAR FEW GRAM NEGATIVE RODS    Culture   Final    ABUNDANT ESCHERICHIA COLI ABUNDANT KLEBSIELLA PNEUMONIAE NO ANAEROBES ISOLATED Performed at [facility], [address] [place], [place] [address]    Report Status 02/01/2022 FINAL  Final   Organism ID, Bacteria ESCHERICHIA COLI  Final   Organism ID, Bacteria KLEBSIELLA PNEUMONIAE  Final      Susceptibility   Escherichia coli - MIC*    AMPICILLIN <=2 SENSITIVE Sensitive     CEFAZOLIN <=4 SENSITIVE Sensitive     CEFEPIME <=0.12 SENSITIVE Sensitive     CEFTAZIDIME <=1 SENSITIVE Sensitive     CEFTRIAXONE <=0.25 SENSITIVE Sensitive     CIPROFLOXACIN <=0.25 SENSITIVE Sensitive     GENTAMICIN <=1 SENSITIVE Sensitive     IMIPENEM <=0.25 SENSITIVE Sensitive     TRIMETH/SULFA <=20 SENSITIVE Sensitive     AMPICILLIN/SULBACTAM <=2 SENSITIVE Sensitive     PIP/TAZO <=4 SENSITIVE Sensitive     * ABUNDANT ESCHERICHIA COLI   Klebsiella pneumoniae - MIC*    AMPICILLIN >=32 RESISTANT Resistant     CEFAZOLIN >=64 RESISTANT Resistant     CEFEPIME <=0.12 SENSITIVE Sensitive     CEFTAZIDIME <=1 SENSITIVE Sensitive     CEFTRIAXONE  <=0.25 SENSITIVE Sensitive     CIPROFLOXACIN <=0.25 SENSITIVE Sensitive     GENTAMICIN <=1 SENSITIVE Sensitive     IMIPENEM 0.5 SENSITIVE Sensitive     TRIMETH/SULFA <=20 SENSITIVE Sensitive     AMPICILLIN/SULBACTAM >=32 RESISTANT Resistant     PIP/TAZO <=4 SENSITIVE Sensitive     * ABUNDANT KLEBSIELLA PNEUMONIAE  Aerobic/Anaerobic Culture w Gram Stain (surgical/deep wound)     Status: None (Preliminary result)   Collection Time: 02/04/22 11:35 AM   Specimen: Liver; Abscess  Result Value Ref Range Status   Specimen Description   Final    LIVER ABSCESS Performed at [place] [address], [place], [place] [address]    Special Requests   Final    NONE Performed at [facility], [place] [address] [place], [place] [address]    Gram Stain   Final    ABUNDANT WBC PRESENT, PREDOMINANTLY PMN NO ORGANISMS SEEN    Culture   Final    FEW GRAM NEGATIVE RODS CULTURE REINCUBATED  FOR BETTER GROWTH SUSCEPTIBILITIES TO FOLLOW Performed at [facility], [place] [address], [place], [place] [address]    Report Status PENDING  Incomplete      Radiology Studies: DG CHEST PORT 1 VIEW  Result Date: 02/04/2022 CLINICAL DATA:  Physical exam abnormality EXAM: PORTABLE CHEST 1 VIEW COMPARISON:  10/24/2013 FINDINGS: Left shoulder arthroplasty. Numerous leads and wires project over the chest. Midline trachea. Moderate cardiomegaly. Atherosclerosis in the transverse aorta. No pleural effusion or pneumothorax. No congestive failure. Clear right lung. The left hemidiaphragm is partially obscured. IMPRESSION: Left hemidiaphragm obscuration, suggesting left lower lobe airspace disease. This could represent atelectasis or infection. Consider radiographic follow-up to confirm resolution. Mild cardiomegaly, without congestive failure. Aortic Atherosclerosis (ICD10-I70.0). Electronically Signed   By: Abigail Miyamoto M.D.   On: 02/04/2022 14:18   CT GUIDED VISCERAL FLUID  DRAIN BY PERC CATH  Result Date: 02/04/2022 INDICATION: 88 year old female status post recent cholecystectomy with subcapsular [place] fluid collection concerning for abscess. EXAM: CT IMAGE GUIDED DRAINAGE BY PERCUTANEOUS CATHETER COMPARISON:  02/03/2022 MEDICATIONS: The patient is currently admitted to the hospital and receiving intravenous antibiotics. The antibiotics were administered within an appropriate time frame prior to the initiation of the procedure. ANESTHESIA/SEDATION: Moderate (conscious) sedation was employed during this procedure. A total of Versed 2 mg and Fentanyl 100 mcg was administered intravenously. Moderate Sedation Time: 21 minutes. The patient's level of consciousness and vital signs were monitored continuously by radiology nursing throughout the procedure under my direct supervision. CONTRAST:  None COMPLICATIONS: None immediate. PROCEDURE: RADIATION DOSE REDUCTION: This exam was performed according to the departmental dose-optimization program which includes automated exposure control, adjustment of the mA and/or kV according to patient size and/or use of iterative reconstruction technique. Informed written consent was obtained from the patient after a discussion of the risks, benefits and alternatives to treatment. The patient was placed supine on the CT gantry and a pre procedural CT was performed re-demonstrating the known abscess/fluid collection within the subcapsular right lobe of the liver. The procedure was planned. A timeout was performed prior to the initiation of the procedure. The right upper quadrant was prepped and draped in the usual sterile fashion. The overlying soft tissues were anesthetized with 1% lidocaine with epinephrine. Appropriate trajectory was planned with the use of a 22 gauge spinal needle. An 18 gauge trocar needle was advanced into the abscess/fluid collection and a short Amplatz super stiff wire was coiled within the collection. Appropriate positioning  was confirmed with a limited CT scan. The tract was serially dilated allowing placement of a 10.2 [place] all-purpose drainage catheter. Appropriate positioning was confirmed with a limited postprocedural CT scan. Approximately 10 ml of sanguinopurulent fluid was aspirated. The tube was connected to a bulb suction and sutured in place. A dressing was placed. The patient tolerated the procedure well without immediate post procedural complication. IMPRESSION: Successful CT guided placement of a 10.2 [place] all purpose drain catheter into the right subcapsular hepatic abscess with aspiration of 10 mL of purulent fluid. Samples were sent to the laboratory as requested by the ordering clinical team. Ruthann Cancer, MD [facility] Radiology Electronically Signed   By: Ruthann Cancer M.D.   On: 02/04/2022 12:19   CT Abdomen Pelvis W Contrast  Result Date: 02/03/2022 CLINICAL DATA:  Postoperative abdominal pain, status post cholecystectomy, persistent pain, no drainage from drainage catheter EXAM: CT ABDOMEN AND PELVIS WITH CONTRAST TECHNIQUE: Multidetector CT imaging of the abdomen and pelvis was performed using  the standard protocol following bolus administration of intravenous contrast. RADIATION DOSE REDUCTION: This exam was performed according to the departmental dose-optimization program which includes automated exposure control, adjustment of the mA and/or kV according to patient size and/or use of iterative reconstruction technique. CONTRAST:  167m OMNIPAQUE IOHEXOL 300 MG/ML SOLN, additional oral enteric contrast COMPARISON:  01/27/2022 FINDINGS: Lower chest: Small right, trace left pleural effusions with associated atelectasis or consolidation. Cardiomegaly and coronary artery calcifications. Aortic valve calcifications. Hepatobiliary: No focal liver abnormality is seen. Status post cholecystectomy. Intra and extrahepatic biliary ductal dilatation, the common bile  duct measuring up to 1.0 cm (series 5, image 53). Surgical drain is positioned in the gallbladder fossa with a small air and fluid collection in the gallbladder fossa measuring 2.7 x 1.8 cm (series 2, image 31). Additional small fluid collections about the right lobe of the liver, laterally measuring 5.7 x 2.0 cm (series 2, image 36) and anteriorly measuring 5.0 x 1.2 cm (series 2, image 36). Pancreas: Unremarkable. No pancreatic ductal dilatation or surrounding inflammatory changes. Spleen: Normal in size without significant abnormality. Adrenals/Urinary Tract: Adrenal glands are unremarkable. Kidneys are normal, without renal calculi, solid lesion, or hydronephrosis. Bladder is unremarkable. Stomach/Bowel: Stomach is within normal limits. Appendix appears normal. Wall thickening of the hepatic flexure and transverse colon in the right upper quadrant (series 2, image 38). Vascular/Lymphatic: Aortic atherosclerosis. No enlarged abdominal or pelvic lymph nodes. Reproductive: No mass or other significant abnormality. Other: No abdominal wall hernia or abnormality. No ascites. Musculoskeletal: No acute or significant osseous findings. IMPRESSION: 1. Status post cholecystectomy. Surgical drain is positioned in the gallbladder fossa. 2. Multiple air and fluid collections in the gallbladder fossa and about the right lobe of the liver, concerning for postoperative infection. The presence or absence of infection within these collections is however not established by CT. 3. Intra and extrahepatic biliary ductal dilatation, the common bile duct measuring up to 1.0 cm. 4. Wall thickening of the hepatic flexure and transverse colon in the right upper quadrant, likely reactive to adjacent inflammation. 5. Small right, trace left pleural effusions with associated atelectasis or consolidation. 6. Cardiomegaly and coronary artery disease. Aortic Atherosclerosis (ICD10-I70.0). Electronically Signed   By: ADelanna AhmadiM.D.   On:  02/03/2022 13:23      LOS: 2 days    RCordelia Poche MD [facility] 02/05/2022, 7:37 AM   If 7PM-7AM, please contact night-coverage [email or web address]

## 2022-02-05 NOTE — Progress Notes (Signed)
Progress Note     Subjective: Continues to feel improved though complains of abdominal pain up to 8/10 intermittently - improves some with IV dilaudid. Tolerating reg diet though decreased appetite. No nausea/emesis. Denies flatus or recent BM. Ambulating.  Objective: Vital signs in last 24 hours: Temp:  [98.3 F (36.8 C)-98.6 F (37 C)] 98.6 F (37 C) (11/09 0634) Pulse Rate:  [71-100] 90 (11/09 0634) Resp:  [14-17] 16 (11/09 0634) BP: (104-157)/(44-74) 145/74 (11/09 0634) SpO2:  [87 %-100 %] 90 % (11/09 0634) Last BM Date : 02/03/22  Intake/Output from previous day: 11/08 0701 - 11/09 0700 In: 472.8 [I.V.:291.2; IV Piggyback:176.6] Out: 540 [Urine:500; Drains:40] Intake/Output this shift: No intake/output data recorded.  PE: General: pleasant, WD, female who is laying in bed in NAD Heart: regular, rate, and rhythm.  Palpable radial pulses bilaterally Lungs: Respiratory effort nonlabored on room air Abd: soft, +BS. No distension. Moderate TTP over incisions and RUQ. Greatest in RUQ. No rebound or guarding. Incisions with c/d/I with surgical glue and mild surrounding ecchymosis. JP drain with old serosanguineous output. IR drain with scant SS output MSK: all 4 extremities are symmetrical with no cyanosis, clubbing, or edema. Skin: warm and dry with no masses, lesions, or rashes. Calves soft and nontender Psych: A&Ox3 with an appropriate affect.    Lab Results:  Recent Labs    02/04/22 0649 02/05/22 0504  WBC 17.7* 18.7*  HGB 9.8* 10.9*  HCT 30.1* 33.6*  PLT 447* 439*    BMET Recent Labs    02/03/22 1102 02/04/22 0649  NA 141 139  K 4.0 3.5  CL 102 104  CO2 27 25  GLUCOSE 113* 69*  BUN 7* 7*  CREATININE 0.56 0.54  CALCIUM 8.8* 7.9*    PT/INR Recent Labs    02/04/22 0934  LABPROT 15.2  INR 1.2   CMP     Component Value Date/Time   NA 139 02/04/2022 0649   NA 135 01/26/2022 1202   K 3.5 02/04/2022 0649   CL 104 02/04/2022 0649   CO2 25  02/04/2022 0649   GLUCOSE 69 (L) 02/04/2022 0649   BUN 7 (L) 02/04/2022 0649   BUN 11 01/26/2022 1202   CREATININE 0.54 02/04/2022 0649   CALCIUM 7.9 (L) 02/04/2022 0649   PROT 5.0 (L) 02/04/2022 0649   PROT 5.8 (L) 01/26/2022 1202   ALBUMIN 1.9 (L) 02/04/2022 0649   ALBUMIN 3.5 (L) 01/26/2022 1202   AST 25 02/04/2022 0649   ALT 26 02/04/2022 0649   ALKPHOS 70 02/04/2022 0649   BILITOT 1.1 02/04/2022 0649   BILITOT 0.9 01/26/2022 1202   GFRNONAA >60 02/04/2022 0649   GFRAA 78 11/09/2019 0947   Lipase     Component Value Date/Time   LIPASE 25 02/03/2022 1102       Studies/Results: DG CHEST PORT 1 VIEW  Result Date: 02/04/2022 CLINICAL DATA:  Physical exam abnormality EXAM: PORTABLE CHEST 1 VIEW COMPARISON:  10/24/2013 FINDINGS: Left shoulder arthroplasty. Numerous leads and wires project over the chest. Midline trachea. Moderate cardiomegaly. Atherosclerosis in the transverse aorta. No pleural effusion or pneumothorax. No congestive failure. Clear right lung. The left hemidiaphragm is partially obscured. IMPRESSION: Left hemidiaphragm obscuration, suggesting left lower lobe airspace disease. This could represent atelectasis or infection. Consider radiographic follow-up to confirm resolution. Mild cardiomegaly, without congestive failure. Aortic Atherosclerosis (ICD10-I70.0). Electronically Signed   By: Abigail Miyamoto M.D.   On: 02/04/2022 14:18   CT GUIDED VISCERAL FLUID DRAIN BY PERC CATH  Result Date: 02/04/2022 INDICATION: 88 year old female status post recent cholecystectomy with subcapsular Paddock fluid collection concerning for abscess. EXAM: CT IMAGE GUIDED DRAINAGE BY PERCUTANEOUS CATHETER COMPARISON:  02/03/2022 MEDICATIONS: The patient is currently admitted to the hospital and receiving intravenous antibiotics. The antibiotics were administered within an appropriate time frame prior to the initiation of the procedure. ANESTHESIA/SEDATION: Moderate (conscious) sedation was  employed during this procedure. A total of Versed 2 mg and Fentanyl 100 mcg was administered intravenously. Moderate Sedation Time: 21 minutes. The patient's level of consciousness and vital signs were monitored continuously by radiology nursing throughout the procedure under my direct supervision. CONTRAST:  None COMPLICATIONS: None immediate. PROCEDURE: RADIATION DOSE REDUCTION: This exam was performed according to the departmental dose-optimization program which includes automated exposure control, adjustment of the mA and/or kV according to patient size and/or use of iterative reconstruction technique. Informed written consent was obtained from the patient after a discussion of the risks, benefits and alternatives to treatment. The patient was placed supine on the CT gantry and a pre procedural CT was performed re-demonstrating the known abscess/fluid collection within the subcapsular right lobe of the liver. The procedure was planned. A timeout was performed prior to the initiation of the procedure. The right upper quadrant was prepped and draped in the usual sterile fashion. The overlying soft tissues were anesthetized with 1% lidocaine with epinephrine. Appropriate trajectory was planned with the use of a 22 gauge spinal needle. An 18 gauge trocar needle was advanced into the abscess/fluid collection and a short Amplatz super stiff wire was coiled within the collection. Appropriate positioning was confirmed with a limited CT scan. The tract was serially dilated allowing placement of a 10.2 [place] all-purpose drainage catheter. Appropriate positioning was confirmed with a limited postprocedural CT scan. Approximately 10 ml of sanguinopurulent fluid was aspirated. The tube was connected to a bulb suction and sutured in place. A dressing was placed. The patient tolerated the procedure well without immediate post procedural complication. IMPRESSION: Successful CT guided placement of a 10.2 [place] all purpose  drain catheter into the right subcapsular hepatic abscess with aspiration of 10 mL of purulent fluid. Samples were sent to the laboratory as requested by the ordering clinical team. Ruthann Cancer, MD [facility] Rad[facility] Electronically Signed   By: Ruthann Cancer M.D.   On: 02/04/2022 12:19   CT Abdomen Pelvis W Contrast  Result Date: 02/03/2022 CLINICAL DATA:  Postoperative abdominal pain, status post cholecystectomy, persistent pain, no drainage from drainage catheter EXAM: CT ABDOMEN AND PELVIS WITH CONTRAST TECHNIQUE: Multidetector CT imaging of the abdomen and pelvis was performed using the standard protocol following bolus administration of intravenous contrast. RADIATION DOSE REDUCTION: This exam was performed according to the departmental dose-optimization program which includes automated exposure control, adjustment of the mA and/or kV according to patient size and/or use of iterative reconstruction technique. CONTRAST:  167m OMNIPAQUE IOHEXOL 300 MG/ML SOLN, additional oral enteric contrast COMPARISON:  01/27/2022 FINDINGS: Lower chest: Small right, trace left pleural effusions with associated atelectasis or consolidation. Cardiomegaly and coronary artery calcifications. Aortic valve calcifications. Hepatobiliary: No focal liver abnormality is seen. Status post cholecystectomy. Intra and extrahepatic biliary ductal dilatation, the common bile duct measuring up to 1.0 cm (series 5, image 53). Surgical drain is positioned in the gallbladder fossa with a small air and fluid collection in the gallbladder fossa measuring 2.7 x 1.8 cm (series 2, image 31). Additional small fluid collections about the right lobe of the liver, laterally measuring  5.7 x 2.0 cm (series 2, image 36) and anteriorly measuring 5.0 x 1.2 cm (series 2, image 36). Pancreas: Unremarkable. No pancreatic ductal dilatation or surrounding inflammatory changes. Spleen: Normal in size without  significant abnormality. Adrenals/Urinary Tract: Adrenal glands are unremarkable. Kidneys are normal, without renal calculi, solid lesion, or hydronephrosis. Bladder is unremarkable. Stomach/Bowel: Stomach is within normal limits. Appendix appears normal. Wall thickening of the hepatic flexure and transverse colon in the right upper quadrant (series 2, image 38). Vascular/Lymphatic: Aortic atherosclerosis. No enlarged abdominal or pelvic lymph nodes. Reproductive: No mass or other significant abnormality. Other: No abdominal wall hernia or abnormality. No ascites. Musculoskeletal: No acute or significant osseous findings. IMPRESSION: 1. Status post cholecystectomy. Surgical drain is positioned in the gallbladder fossa. 2. Multiple air and fluid collections in the gallbladder fossa and about the right lobe of the liver, concerning for postoperative infection. The presence or absence of infection within these collections is however not established by CT. 3. Intra and extrahepatic biliary ductal dilatation, the common bile duct measuring up to 1.0 cm. 4. Wall thickening of the hepatic flexure and transverse colon in the right upper quadrant, likely reactive to adjacent inflammation. 5. Small right, trace left pleural effusions with associated atelectasis or consolidation. 6. Cardiomegaly and coronary artery disease. Aortic Atherosclerosis (ICD10-I70.0). Electronically Signed   By: Delanna Ahmadi M.D.   On: 02/03/2022 13:23    Anti-infectives: Anti-infectives (From admission, onward)    Start     Dose/Rate Route Frequency Ordered Stop   02/03/22 1800  piperacillin-tazobactam (ZOSYN) IVPB 3.375 g        3.375 g 12.5 mL/hr over 240 Minutes Intravenous Every 8 hours 02/03/22 1519     02/03/22 1515  piperacillin-tazobactam (ZOSYN) IVPB 3.375 g  Status:  Discontinued        3.375 g 100 mL/hr over 30 Minutes Intravenous Every 8 hours 02/03/22 1513 02/03/22 1519   02/03/22 1130  piperacillin-tazobactam (ZOSYN) IVPB  3.375 g        3.375 g 100 mL/hr over 30 Minutes Intravenous  Once 02/03/22 1127 02/03/22 1231        Assessment/Plan Abdominal pain, leukocytosis POD9 status post laparoscopic cholecystectomy with JP drain placement 10/31 Dr. Thermon Leyland for gangrenous cholecystitis with intra-op finding of liver abscess -WBC 18.7 (17.7). afebrile. continue zosyn - intra-op culture of liver abscess 10/31 with e coli and klebsiella with resistance to ampicillin. - CT with additional fluid collections noted - s/p IR drain 11/8 - IR drain output 25, gram neg rods so far. Follow culture - JP drain output 15 ml, continue drain - taking iv pain med frequently - pain medications adjusted, addition of oxycodone '5mg'$  prn, scheduled tylenol, scheduled ibuprofen, lidocaine patch - start bid colace. Miralax prn  FEN: regular, ensure ID: [email or web address] VTE: okay for chemic prophylaxis from surgical standpoint   LOS: 2 days   Winferd Humphrey, [facility] 02/05/2022, 9:30 AM Please see Amion for pager number during day hours 7:00am-4:30pm

## 2022-02-05 NOTE — Progress Notes (Signed)
Referring Physician(s): Connor,C  Supervising Physician: Michaelle Birks  Patient Status:  [facility] - In-pt  Chief Complaint:  Hepatic abscess post acute gangrenous cholecystitis/cholecystectomy  Subjective: Pt cont to have some intermittent RUQ discomfort; currently denies fever/chills, N/V or worsening resp issues; has eaten; occ cough; no BM   Allergies: Patient has no known allergies.  Medications: Prior to Admission medications   Medication Sig Start Date End Date Taking? Authorizing Provider  acetaminophen (TYLENOL) 500 MG tablet Take 2 tablets (1,000 mg total) by mouth every 6 (six) hours as needed. Patient taking differently: Take 1,000 mg by mouth every 6 (six) hours as needed for moderate pain. 01/29/22  Yes Saverio Danker, PA-C  ADVIL 200 MG CAPS Take 200 mg by mouth every 6 (six) hours as needed (for headaches).   Yes [provider]  amoxicillin-clavulanate (AUGMENTIN) 875-125 MG tablet Take 1 tablet by mouth 2 (two) times daily for 8 days. 01/29/22 02/06/22 Yes Saverio Danker, PA-C  atorvastatin (LIPITOR) 40 MG tablet TAKE 1 TABLET BY MOUTH AT  BEDTIME Patient taking differently: Take 40 mg by mouth at bedtime. 11/27/21  Yes Rita Ohara, MD  Biotin 10000 MCG TABS Take 10,000 mcg by mouth in the morning.   Yes [provider]  Calcium Carbonate-Vitamin D (CALTRATE 600+D PO) Take 1 tablet by mouth daily.   Yes [provider]  ipratropium (ATROVENT) 0.06 % nasal spray USE 2 SPRAYS IN EACH NOSTRIL 3 TO 4 TIMES DAILY FOR RUNNY NOSE Patient taking differently: Place 2 sprays into both nostrils in the morning and at bedtime. 09/15/21  Yes Rita Ohara, MD  metoprolol succinate (TOPROL-XL) 50 MG 24 hr tablet Take 1 tablet (50 mg total) by mouth daily. Take with or immediately following a meal. Patient taking differently: Take 50 mg by mouth daily. 12/24/21  Yes Rita Ohara, MD  Multiple Vitamin (MULTIVITAMIN WITH MINERALS) TABS tablet Take 1 tablet by mouth  daily with breakfast. CENTRUM MULTIVITAMIN   Yes [provider]  omeprazole (PRILOSEC) 20 MG capsule Take 20 mg by mouth daily before breakfast.   Yes [provider]  PREVIDENT 5000 BOOSTER PLUS 1.1 % PSTE Place 1 application  onto teeth every 3 (three) days. 10/30/21  Yes [provider]  Probiotic Product (PROBIOTIC PO) Take 1 capsule by mouth daily.   Yes [provider]  Wheat Dextrin (BENEFIBER PO) Take 8-10 g by mouth See admin instructions. Mix 8-10 grams (2 teaspoonsful) of powder into applesauce or the suggested amount of warm water and consume 2 times a day   Yes [provider]  amoxicillin (AMOXIL) 500 MG capsule Take 2,000 mg by mouth See admin instructions. Take 2,000 mg by mouth one hour prior to dental appointments Patient not taking: Reported on 02/03/2022 12/02/21   [provider]  oxyCODONE (OXY IR/ROXICODONE) 5 MG immediate release tablet Take 1 tablet (5 mg total) by mouth every 4 (four) hours as needed for moderate pain. Patient not taking: Reported on 02/03/2022 01/29/22   Saverio Danker, PA-C     Vital Signs: BP (!) 145/74 (BP Location: Left Arm)   Pulse 90   Temp 98.6 F (37 C) (Oral)   Resp 16   Ht '4\' 10"'$  (1.473 m)   Wt 125 lb (56.7 kg)   SpO2 90%   BMI 26.13 kg/m   Physical Exam : awake/alert; hepatic drain intact, insertion site ok, mildly tender, OP 40 cc serosang fluid; drain flushed without difficulty; RT ABD SURGICAL DRAIN IN PLACE  Imaging: DG CHEST PORT 1 VIEW  Result Date: 02/04/2022 CLINICAL DATA:  Physical exam abnormality EXAM: PORTABLE CHEST 1 VIEW COMPARISON:  10/24/2013 FINDINGS: Left shoulder arthroplasty. Numerous leads and wires project over the chest. Midline trachea. Moderate cardiomegaly. Atherosclerosis in the transverse aorta. No pleural effusion or pneumothorax. No congestive failure. Clear right lung. The left hemidiaphragm is partially obscured. IMPRESSION: Left hemidiaphragm  obscuration, suggesting left lower lobe airspace disease. This could represent atelectasis or infection. Consider radiographic follow-up to confirm resolution. Mild cardiomegaly, without congestive failure. Aortic Atherosclerosis (ICD10-I70.0). Electronically Signed   By: Abigail Miyamoto M.D.   On: 02/04/2022 14:18   CT GUIDED VISCERAL FLUID DRAIN BY PERC CATH  Result Date: 02/04/2022 INDICATION: 88 year old female status post recent cholecystectomy with subcapsular Paddock fluid collection concerning for abscess. EXAM: CT IMAGE GUIDED DRAINAGE BY PERCUTANEOUS CATHETER COMPARISON:  02/03/2022 MEDICATIONS: The patient is currently admitted to the hospital and receiving intravenous antibiotics. The antibiotics were administered within an appropriate time frame prior to the initiation of the procedure. ANESTHESIA/SEDATION: Moderate (conscious) sedation was employed during this procedure. A total of Versed 2 mg and Fentanyl 100 mcg was administered intravenously. Moderate Sedation Time: 21 minutes. The patient's level of consciousness and vital signs were monitored continuously by radiology nursing throughout the procedure under my direct supervision. CONTRAST:  None COMPLICATIONS: None immediate. PROCEDURE: RADIATION DOSE REDUCTION: This exam was performed according to the departmental dose-optimization program which includes automated exposure control, adjustment of the mA and/or kV according to patient size and/or use of iterative reconstruction technique. Informed written consent was obtained from the patient after a discussion of the risks, benefits and alternatives to treatment. The patient was placed supine on the CT gantry and a pre procedural CT was performed re-demonstrating the known abscess/fluid collection within the subcapsular right lobe of the liver. The procedure was planned. A timeout was performed prior to the initiation of the procedure. The right upper quadrant was prepped and draped in the usual  sterile fashion. The overlying soft tissues were anesthetized with 1% lidocaine with epinephrine. Appropriate trajectory was planned with the use of a 22 gauge spinal needle. An 18 gauge trocar needle was advanced into the abscess/fluid collection and a short Amplatz super stiff wire was coiled within the collection. Appropriate positioning was confirmed with a limited CT scan. The tract was serially dilated allowing placement of a 10.2 [place] all-purpose drainage catheter. Appropriate positioning was confirmed with a limited postprocedural CT scan. Approximately 10 ml of sanguinopurulent fluid was aspirated. The tube was connected to a bulb suction and sutured in place. A dressing was placed. The patient tolerated the procedure well without immediate post procedural complication. IMPRESSION: Successful CT guided placement of a 10.2 [place] all purpose drain catheter into the right subcapsular hepatic abscess with aspiration of 10 mL of purulent fluid. Samples were sent to the laboratory as requested by the ordering clinical team. Ruthann Cancer, MD [facility] Radiology Electronically Signed   By: Ruthann Cancer M.D.   On: 02/04/2022 12:19   CT Abdomen Pelvis W Contrast  Result Date: 02/03/2022 CLINICAL DATA:  Postoperative abdominal pain, status post cholecystectomy, persistent pain, no drainage from drainage catheter EXAM: CT ABDOMEN AND PELVIS WITH CONTRAST TECHNIQUE: Multidetector CT imaging of the abdomen and pelvis was performed using the standard protocol following bolus administration of intravenous contrast. RADIATION DOSE REDUCTION: This exam was performed according to the departmental dose-optimization program which includes automated exposure control, adjustment of the mA and/or kV  according to patient size and/or use of iterative reconstruction technique. CONTRAST:  114m OMNIPAQUE IOHEXOL 300 MG/ML SOLN, additional oral enteric contrast COMPARISON:   01/27/2022 FINDINGS: Lower chest: Small right, trace left pleural effusions with associated atelectasis or consolidation. Cardiomegaly and coronary artery calcifications. Aortic valve calcifications. Hepatobiliary: No focal liver abnormality is seen. Status post cholecystectomy. Intra and extrahepatic biliary ductal dilatation, the common bile duct measuring up to 1.0 cm (series 5, image 53). Surgical drain is positioned in the gallbladder fossa with a small air and fluid collection in the gallbladder fossa measuring 2.7 x 1.8 cm (series 2, image 31). Additional small fluid collections about the right lobe of the liver, laterally measuring 5.7 x 2.0 cm (series 2, image 36) and anteriorly measuring 5.0 x 1.2 cm (series 2, image 36). Pancreas: Unremarkable. No pancreatic ductal dilatation or surrounding inflammatory changes. Spleen: Normal in size without significant abnormality. Adrenals/Urinary Tract: Adrenal glands are unremarkable. Kidneys are normal, without renal calculi, solid lesion, or hydronephrosis. Bladder is unremarkable. Stomach/Bowel: Stomach is within normal limits. Appendix appears normal. Wall thickening of the hepatic flexure and transverse colon in the right upper quadrant (series 2, image 38). Vascular/Lymphatic: Aortic atherosclerosis. No enlarged abdominal or pelvic lymph nodes. Reproductive: No mass or other significant abnormality. Other: No abdominal wall hernia or abnormality. No ascites. Musculoskeletal: No acute or significant osseous findings. IMPRESSION: 1. Status post cholecystectomy. Surgical drain is positioned in the gallbladder fossa. 2. Multiple air and fluid collections in the gallbladder fossa and about the right lobe of the liver, concerning for postoperative infection. The presence or absence of infection within these collections is however not established by CT. 3. Intra and extrahepatic biliary ductal dilatation, the common bile duct measuring up to 1.0 cm. 4. Wall thickening  of the hepatic flexure and transverse colon in the right upper quadrant, likely reactive to adjacent inflammation. 5. Small right, trace left pleural effusions with associated atelectasis or consolidation. 6. Cardiomegaly and coronary artery disease. Aortic Atherosclerosis (ICD10-I70.0). Electronically Signed   By: ADelanna AhmadiM.D.   On: 02/03/2022 13:23    Labs:  CBC: Recent Labs    01/28/22 0428 02/03/22 1102 02/04/22 0649 02/05/22 0504  WBC 14.0* 19.2* 17.7* 18.7*  HGB 10.8* 10.9* 9.8* 10.9*  HCT 32.8* 33.9* 30.1* 33.6*  PLT 341 539* 447* 439*    COAGS: Recent Labs    02/04/22 0934  INR 1.2    BMP: Recent Labs    01/27/22 1306 01/28/22 0428 02/03/22 1102 02/04/22 0649  NA 137 135 141 139  K 3.7 4.0 4.0 3.5  CL 102 104 102 104  CO2 '26 24 27 25  '$ GLUCOSE 116* 225* 113* 69*  BUN 14 10 7* 7*  CALCIUM 8.9 8.0* 8.8* 7.9*  CREATININE 0.83 0.77 0.56 0.54  GFRNONAA >60 >60 >60 >60    LIVER FUNCTION TESTS: Recent Labs    01/27/22 1306 01/28/22 0428 02/03/22 1102 02/04/22 0649  BILITOT 1.1 0.6 0.8 1.1  AST 44* 62* 31 25  ALT 41 50* 34 26  ALKPHOS 118 90 89 70  PROT 6.3* 5.4* 5.8* 5.0*  ALBUMIN 2.8* 2.3* 2.3* 1.9*    Assessment and Plan: Pt s/p laparoscopic cholecystectomy with JP drain placement on 01/27/22 for acute gangrenous cholecystitis; now s/p hepatic abscess drain placement 11/8 (10 fr to JP); afebrile, WBC 18.7(17.7), hgb 10.9(9.8), drain fl cx pend; cont drain irrigation, close output monitoring, lab checks; once output minimal over 2-3 day span or if WBC cont to increase consider  f/u CT   Electronically Signed: D. Rowe Robert, PA-C 02/05/2022, 12:43 PM   I spent a total of 15 Minutes at the the patient's bedside AND on the patient's hospital floor or unit, greater than 50% of which was counseling/coordinating care for hepatic abscess drain    Patient ID: Alexis Hines, female   DOB: December 06, 1933, 88 y.o.   MRN: [id number]

## 2022-02-05 NOTE — Plan of Care (Signed)
  Problem: Clinical Measurements: Goal: Diagnostic test results will improve Outcome: Progressing   Problem: Nutrition: Goal: Adequate nutrition will be maintained Outcome: Progressing   Problem: Activity: Goal: Risk for activity intolerance will decrease Outcome: Progressing

## 2022-02-06 DIAGNOSIS — K810 Acute cholecystitis: Secondary | ICD-10-CM

## 2022-02-06 DIAGNOSIS — K750 Abscess of liver: Secondary | ICD-10-CM | POA: Diagnosis present

## 2022-02-06 DIAGNOSIS — I471 Supraventricular tachycardia, unspecified: Secondary | ICD-10-CM | POA: Diagnosis not present

## 2022-02-06 DIAGNOSIS — D649 Anemia, unspecified: Secondary | ICD-10-CM | POA: Diagnosis not present

## 2022-02-06 DIAGNOSIS — I5189 Other ill-defined heart diseases: Secondary | ICD-10-CM | POA: Diagnosis not present

## 2022-02-06 DIAGNOSIS — T8140XA Infection following a procedure, unspecified, initial encounter: Secondary | ICD-10-CM | POA: Diagnosis not present

## 2022-02-06 LAB — CBC
HCT: 32.4 % — ABNORMAL LOW (ref 36.0–46.0)
Hemoglobin: 10.5 g/dL — ABNORMAL LOW (ref 12.0–15.0)
MCH: 29.5 pg (ref 26.0–34.0)
MCHC: 32.4 g/dL (ref 30.0–36.0)
MCV: 91 fL (ref 80.0–100.0)
Platelets: 400 10*3/uL (ref 150–400)
RBC: 3.56 MIL/uL — ABNORMAL LOW (ref 3.87–5.11)
RDW: 14.4 % (ref 11.5–15.5)
WBC: 14.9 10*3/uL — ABNORMAL HIGH (ref 4.0–10.5)
nRBC: 0 % (ref 0.0–0.2)

## 2022-02-06 LAB — BASIC METABOLIC PANEL
Anion gap: 6 (ref 5–15)
BUN: 10 mg/dL (ref 8–23)
CO2: 28 mmol/L (ref 22–32)
Calcium: 7.7 mg/dL — ABNORMAL LOW (ref 8.9–10.3)
Chloride: 104 mmol/L (ref 98–111)
Creatinine, Ser: 0.63 mg/dL (ref 0.44–1.00)
GFR, Estimated: >60 mL/min (ref >60)
Glucose, Bld: 105 mg/dL — ABNORMAL HIGH (ref 70–99)
Potassium: 3.3 mmol/L — ABNORMAL LOW (ref 3.5–5.1)
Sodium: 138 mmol/L (ref 135–145)

## 2022-02-06 MED ORDER — SODIUM CHLORIDE 0.9 % IV SOLN
2.0000 g | Freq: Every day | INTRAVENOUS | Status: DC
  Administered 2022-02-06 – 2022-02-11 (×6): 2 g via INTRAVENOUS
  Filled 2022-02-06 (×6): qty 20

## 2022-02-06 MED ORDER — PSYLLIUM 95 % PO PACK
1.0000 | PACK | Freq: Every day | ORAL | Status: DC
  Administered 2022-02-06 – 2022-02-11 (×6): 1 via ORAL
  Filled 2022-02-06 (×6): qty 1

## 2022-02-06 MED ORDER — POTASSIUM CHLORIDE 10 MEQ/100ML IV SOLN
10.0000 meq | INTRAVENOUS | Status: DC
  Filled 2022-02-06: qty 100

## 2022-02-06 MED ORDER — POTASSIUM CHLORIDE CRYS ER 20 MEQ PO TBCR
40.0000 meq | EXTENDED_RELEASE_TABLET | ORAL | Status: AC
  Administered 2022-02-06 (×2): 40 meq via ORAL
  Filled 2022-02-06 (×2): qty 2

## 2022-02-06 MED ORDER — DOCUSATE SODIUM 100 MG PO CAPS: 100.0000 mg | ORAL_CAPSULE | Freq: Two times a day (BID) | ORAL | Status: DC | PRN

## 2022-02-06 MED ORDER — METRONIDAZOLE 500 MG PO TABS
500.0000 mg | ORAL_TABLET | Freq: Two times a day (BID) | ORAL | Status: DC
  Administered 2022-02-06 – 2022-02-11 (×11): 500 mg via ORAL
  Filled 2022-02-06 (×11): qty 1

## 2022-02-06 MED ORDER — POTASSIUM CHLORIDE CRYS ER 20 MEQ PO TBCR: 40.0000 meq | EXTENDED_RELEASE_TABLET | ORAL | Status: DC

## 2022-02-06 NOTE — Consult Note (Signed)
[name]    Date of Admission:  02/03/2022     Reason for Consult: Liver abscess     Referring Physician: Dr Lonny Prude  Current antibiotics: Zosyn   ASSESSMENT:    88 y.o. female admitted with:  #Gangrenous cholecystitis #Liver abscess Patient status post cholecystectomy due to gangrenous cholecystitis and liver abscess 01/27/2022.  Intraoperative cultures at that time grew Klebsiella pneumoniae and E. coli.  Discharged home on Augmentin, however, Klebsiella noted to have resistance.  She had a surgical JP drain placed at the time of her surgery that remains in place.  Repeat CT scan this admission shows this surgical drain positioned in the gallbladder fossa with residual small fluid collection measuring 2.7 x 1.8 cm.  CT scan noted additional small fluid collections measuring upwards of 5.7 x 2.0 cm and 5.0 x 1.2 cm.  Status post IR guided drain placement 02/04/2022 into this hepatic abscess.  Cultures E. coli.  RECOMMENDATIONS:    Will transition from Zosyn to ceftriaxone and metronidazole based on cultures and susceptibilities Monitor drain output Lab monitoring Will follow Dr. Baxter Flattery is here this weekend.  I will be back Monday.   Principal Problem:   Postoperative infection, initial encounter Active Problems:   Pure hypercholesterolemia   Paroxysmal SVT (supraventricular tachycardia)   Grade I diastolic dysfunction   Protein-calorie malnutrition, severe (HCC)   Normocytic anemia   Malnutrition of moderate degree   MEDICATIONS:    Scheduled Meds:  acetaminophen  1,000 mg Oral Q6H   atorvastatin  40 mg Oral QHS   docusate sodium  100 mg Oral BID   feeding supplement  237 mL Oral BID BM   ibuprofen  400 mg Oral TID   lidocaine  1 patch Transdermal Q24H   metoprolol succinate  50 mg Oral Daily   metroNIDAZOLE  500 mg Oral Q12H   potassium chloride  40 mEq Oral Q4H   psyllium  1 packet Oral Daily   sodium chloride flush  5 mL  Intracatheter Q8H   Continuous Infusions:  cefTRIAXone (ROCEPHIN)  IV 2 g (02/06/22 1008)   PRN Meds:.bisacodyl, HYDROmorphone (DILAUDID) injection, lidocaine (PF), midazolam, ondansetron **OR** ondansetron (ZOFRAN) IV, oxyCODONE, polyethylene glycol  HPI:    Alexis Hines is a 88 y.o. female with past medical history as noted below who was admitted to the surgical service from 10/31 through 01/29/2022 with acute gangrenous cholecystitis.  She underwent laparoscopic cholecystectomy on 01/27/2022 with general surgery.  At that time, operative findings noted that the gallbladder was encased in inflamed omentum.  It was difficult to free the gallbladder from the surrounding inflammation.  It was difficult to dissect out the cystic duct.  There was a perforation of the gallbladder with erosion of the abscess into the liver near the fundus of the gallbladder.  A 19 [place] JP drain was placed in the gallbladder fossa at the time of surgery as she had this liver abscess secondary to the gangrenous cholecystitis.  She had cultures from the OR liver abscess that grew E. coli and Klebsiella pneumoniae.  She was discharged home on Augmentin however, the Klebsiella was noted to be resistant to this.  There were no blood cultures obtained during that admission.  At the time of her discharge, her WBC had improved significantly to 14.00 and her LFTs were only minimally elevated.  Surgical pathology confirmed gangrenous cholecystitis.  Patient returned to the hospital on 11/7 with decreased JP drain output and worsening abdominal pain.  This coincided with subjective fever.  In the emergency department she was afebrile, slightly hypertensive.  CBC showed a increased white blood cell count of 19.2 from prior.  LFTs were normal.  Lactic acid normal.  She was started on Zosyn.  A repeat CT scan was obtained which showed the surgical drain in the gallbladder fossa.  There were multiple air and fluid collections in the  gallbladder fossa and about the right lobe of the liver concerning for postoperative infection.  She was evaluated by interventional radiology who placed a drain catheter into the right subcapsular hepatic abscess with aspiration of 10 mL of fluid.  This culture is growing E. coli.  She has been maintained on Zosyn.  She reports feeling better.  We have been asked to see for further antibiotic recommendations.  Her drain output over the last 4 hours has been recorded as 25 cc.   Past Medical History:  Diagnosis Date   Abnormal TSH 2004   normal since   Arthritis    Basal cell cancer    nose; Dr. Allyson Sabal    Diverticulosis 10/2005   Dizziness    Migraine childhood   resolved   PAC (premature atrial contraction)    frequent on cardiac monitor 09/14/19   Pure hypercholesterolemia    Radial styloid tenosynovitis (de quervain) 11/2018   injected by Dr. Fredna Dow 11/2018, 02/2019   SCCA (squamous cell carcinoma) of skin 12/2015   right temple   Squamous cell carcinoma of skin 02/15/2017   in situ-left jaw ([id number])    Social History   Tobacco Use   Smoking status: Never   Smokeless tobacco: Never  Vaping Use   Vaping Use: Never used  Substance Use Topics   Alcohol use: Yes    Comment: occasional glass of wine.   Drug use: No    Family History  Problem Relation Age of Onset   Cancer Sister        bone cancer L leg with mets to lung, diagnosed age 29; s/p amputation   Hyperlipidemia Brother    Heart disease Brother 53       CABG   Cancer Sister 58       breast cancer and lung cancer (lung dx'd first)   Rheum arthritis Daughter    Gout Son     No Known Allergies  Review of Systems  All other systems reviewed and are negative. Except as noted above.  OBJECTIVE:   Blood pressure (!) 140/68, pulse 90, temperature 98.6 F (37 C), temperature source Oral, resp. rate 17, height '4\' 10"'$  (1.473 m), weight 56.7 kg, SpO2 94 %. Body mass index is 26.13 kg/m.  Physical  Exam Constitutional:      General: She is not in acute distress.    Appearance: Normal appearance.  HENT:     Head: Normocephalic and atraumatic.  Eyes:     Extraocular Movements: Extraocular movements intact.     Conjunctiva/sclera: Conjunctivae normal.  Cardiovascular:     Rate and Rhythm: Normal rate and regular rhythm.  Pulmonary:     Effort: Pulmonary effort is normal. No respiratory distress.  Abdominal:     General: There is no distension.     Palpations: Abdomen is soft.     Tenderness: There is no abdominal tenderness.     Comments: Drains x2 in place  Musculoskeletal:        General: Normal range of motion.     Cervical back: Normal range of motion and neck supple.  Right lower leg: No edema.     Left lower leg: No edema.  Skin:    General: Skin is warm and dry.     Findings: No rash.  Neurological:     General: No focal deficit present.     Mental Status: She is alert and oriented to person, place, and time.  Psychiatric:        Mood and Affect: Mood normal.        Behavior: Behavior normal.      Lab Results: Lab Results  Component Value Date   WBC 14.9 (H) 02/06/2022   HGB 10.5 (L) 02/06/2022   HCT 32.4 (L) 02/06/2022   MCV 91.0 02/06/2022   PLT 400 02/06/2022    Lab Results  Component Value Date   NA 138 02/06/2022   K 3.3 (L) 02/06/2022   CO2 28 02/06/2022   GLUCOSE 105 (H) 02/06/2022   BUN 10 02/06/2022   CREATININE 0.63 02/06/2022   CALCIUM 7.7 (L) 02/06/2022   GFRNONAA >60 02/06/2022   GFRAA 78 11/09/2019    Lab Results  Component Value Date   ALT 26 02/04/2022   AST 25 02/04/2022   ALKPHOS 70 02/04/2022   BILITOT 1.1 02/04/2022    No results found for: "CRP"  No results found for: "ESRSEDRATE"  I have reviewed the micro and lab results in Epic.  Imaging: DG CHEST PORT 1 VIEW  Result Date: 02/04/2022 CLINICAL DATA:  Physical exam abnormality EXAM: PORTABLE CHEST 1 VIEW COMPARISON:  10/24/2013 FINDINGS: Left shoulder  arthroplasty. Numerous leads and wires project over the chest. Midline trachea. Moderate cardiomegaly. Atherosclerosis in the transverse aorta. No pleural effusion or pneumothorax. No congestive failure. Clear right lung. The left hemidiaphragm is partially obscured. IMPRESSION: Left hemidiaphragm obscuration, suggesting left lower lobe airspace disease. This could represent atelectasis or infection. Consider radiographic follow-up to confirm resolution. Mild cardiomegaly, without congestive failure. Aortic Atherosclerosis (ICD10-I70.0). Electronically Signed   By: Abigail Miyamoto M.D.   On: 02/04/2022 14:18   CT GUIDED VISCERAL FLUID DRAIN BY PERC CATH  Result Date: 02/04/2022 INDICATION: 88 year old female status post recent cholecystectomy with subcapsular Paddock fluid collection concerning for abscess. EXAM: CT IMAGE GUIDED DRAINAGE BY PERCUTANEOUS CATHETER COMPARISON:  02/03/2022 MEDICATIONS: The patient is currently admitted to the hospital and receiving intravenous antibiotics. The antibiotics were administered within an appropriate time frame prior to the initiation of the procedure. ANESTHESIA/SEDATION: Moderate (conscious) sedation was employed during this procedure. A total of Versed 2 mg and Fentanyl 100 mcg was administered intravenously. Moderate Sedation Time: 21 minutes. The patient's level of consciousness and vital signs were monitored continuously by radiology nursing throughout the procedure under my direct supervision. CONTRAST:  None COMPLICATIONS: None immediate. PROCEDURE: RADIATION DOSE REDUCTION: This exam was performed according to the departmental dose-optimization program which includes automated exposure control, adjustment of the mA and/or kV according to patient size and/or use of iterative reconstruction technique. Informed written consent was obtained from the patient after a discussion of the risks, benefits and alternatives to treatment. The patient was placed supine on the CT  gantry and a pre procedural CT was performed re-demonstrating the known abscess/fluid collection within the subcapsular right lobe of the liver. The procedure was planned. A timeout was performed prior to the initiation of the procedure. The right upper quadrant was prepped and draped in the usual sterile fashion. The overlying soft tissues were anesthetized with 1% lidocaine with epinephrine. Appropriate trajectory was planned with the use of a 22 gauge  spinal needle. An 18 gauge trocar needle was advanced into the abscess/fluid collection and a short Amplatz super stiff wire was coiled within the collection. Appropriate positioning was confirmed with a limited CT scan. The tract was serially dilated allowing placement of a 10.2 [place] all-purpose drainage catheter. Appropriate positioning was confirmed with a limited postprocedural CT scan. Approximately 10 ml of sanguinopurulent fluid was aspirated. The tube was connected to a bulb suction and sutured in place. A dressing was placed. The patient tolerated the procedure well without immediate post procedural complication. IMPRESSION: Successful CT guided placement of a 10.2 [place] all purpose drain catheter into the right subcapsular hepatic abscess with aspiration of 10 mL of purulent fluid. Samples were sent to the laboratory as requested by the ordering clinical team. Ruthann Cancer, MD [facility] Radiology Electronically Signed   By: Ruthann Cancer M.D.   On: 02/04/2022 12:19     Imaging independently reviewed in Epic.  [facility] [phone] pager 02/06/2022, 10:09 AM

## 2022-02-06 NOTE — Progress Notes (Cosign Needed Addendum)
Referring Physician(s): Connor,C  Supervising Physician: Mir, Biochemist, clinical  Patient Status:  [facility] - In-pt  Chief Complaint:  Hepatic abscess post acute gangrenous cholecystitis/cholecystectomy, abdominal pain   Subjective: Pt stable, afebrile; has ambulated in hallway today; appetite fair; still with some RUQ discomfort esp with coughing    Allergies: Patient has no known allergies.  Medications: Prior to Admission medications   Medication Sig Start Date End Date Taking? Authorizing Provider  acetaminophen (TYLENOL) 500 MG tablet Take 2 tablets (1,000 mg total) by mouth every 6 (six) hours as needed. Patient taking differently: Take 1,000 mg by mouth every 6 (six) hours as needed for moderate pain. 01/29/22  Yes Saverio Danker, PA-C  ADVIL 200 MG CAPS Take 200 mg by mouth every 6 (six) hours as needed (for headaches).   Yes [provider]  amoxicillin-clavulanate (AUGMENTIN) 875-125 MG tablet Take 1 tablet by mouth 2 (two) times daily for 8 days. 01/29/22 02/06/22 Yes Saverio Danker, PA-C  atorvastatin (LIPITOR) 40 MG tablet TAKE 1 TABLET BY MOUTH AT  BEDTIME Patient taking differently: Take 40 mg by mouth at bedtime. 11/27/21  Yes Rita Ohara, MD  Biotin 10000 MCG TABS Take 10,000 mcg by mouth in the morning.   Yes [provider]  Calcium Carbonate-Vitamin D (CALTRATE 600+D PO) Take 1 tablet by mouth daily.   Yes [provider]  ipratropium (ATROVENT) 0.06 % nasal spray USE 2 SPRAYS IN EACH NOSTRIL 3 TO 4 TIMES DAILY FOR RUNNY NOSE Patient taking differently: Place 2 sprays into both nostrils in the morning and at bedtime. 09/15/21  Yes Rita Ohara, MD  metoprolol succinate (TOPROL-XL) 50 MG 24 hr tablet Take 1 tablet (50 mg total) by mouth daily. Take with or immediately following a meal. Patient taking differently: Take 50 mg by mouth daily. 12/24/21  Yes Rita Ohara, MD  Multiple Vitamin (MULTIVITAMIN WITH MINERALS) TABS tablet Take 1 tablet by mouth daily  with breakfast. CENTRUM MULTIVITAMIN   Yes [provider]  omeprazole (PRILOSEC) 20 MG capsule Take 20 mg by mouth daily before breakfast.   Yes [provider]  PREVIDENT 5000 BOOSTER PLUS 1.1 % PSTE Place 1 application  onto teeth every 3 (three) days. 10/30/21  Yes [provider]  Probiotic Product (PROBIOTIC PO) Take 1 capsule by mouth daily.   Yes [provider]  Wheat Dextrin (BENEFIBER PO) Take 8-10 g by mouth See admin instructions. Mix 8-10 grams (2 teaspoonsful) of powder into applesauce or the suggested amount of warm water and consume 2 times a day   Yes [provider]  amoxicillin (AMOXIL) 500 MG capsule Take 2,000 mg by mouth See admin instructions. Take 2,000 mg by mouth one hour prior to dental appointments Patient not taking: Reported on 02/03/2022 12/02/21   [provider]  oxyCODONE (OXY IR/ROXICODONE) 5 MG immediate release tablet Take 1 tablet (5 mg total) by mouth every 4 (four) hours as needed for moderate pain. Patient not taking: Reported on 02/03/2022 01/29/22   Saverio Danker, PA-C     Vital Signs: BP 139/81 (BP Location: Right Arm)   Pulse (!) 104   Temp 97.6 F (36.4 C) (Oral)   Resp 16   Ht '4\' 10"'$  (1.473 m)   Wt 125 lb (56.7 kg)   SpO2 97%   BMI 26.13 kg/m   Physical Exam awake/alert; hepatic drain intact, insertion site ok, OP 25 cc hazy serosang fluid, flushes ok   Imaging: DG CHEST PORT 1 VIEW  Result  Date: 02/04/2022 CLINICAL DATA:  Physical exam abnormality EXAM: PORTABLE CHEST 1 VIEW COMPARISON:  10/24/2013 FINDINGS: Left shoulder arthroplasty. Numerous leads and wires project over the chest. Midline trachea. Moderate cardiomegaly. Atherosclerosis in the transverse aorta. No pleural effusion or pneumothorax. No congestive failure. Clear right lung. The left hemidiaphragm is partially obscured. IMPRESSION: Left hemidiaphragm obscuration, suggesting left lower lobe airspace disease. This could represent  atelectasis or infection. Consider radiographic follow-up to confirm resolution. Mild cardiomegaly, without congestive failure. Aortic Atherosclerosis (ICD10-I70.0). Electronically Signed   By: Abigail Miyamoto M.D.   On: 02/04/2022 14:18   CT GUIDED VISCERAL FLUID DRAIN BY PERC CATH  Result Date: 02/04/2022 INDICATION: 88 year old female status post recent cholecystectomy with subcapsular Paddock fluid collection concerning for abscess. EXAM: CT IMAGE GUIDED DRAINAGE BY PERCUTANEOUS CATHETER COMPARISON:  02/03/2022 MEDICATIONS: The patient is currently admitted to the hospital and receiving intravenous antibiotics. The antibiotics were administered within an appropriate time frame prior to the initiation of the procedure. ANESTHESIA/SEDATION: Moderate (conscious) sedation was employed during this procedure. A total of Versed 2 mg and Fentanyl 100 mcg was administered intravenously. Moderate Sedation Time: 21 minutes. The patient's level of consciousness and vital signs were monitored continuously by radiology nursing throughout the procedure under my direct supervision. CONTRAST:  None COMPLICATIONS: None immediate. PROCEDURE: RADIATION DOSE REDUCTION: This exam was performed according to the departmental dose-optimization program which includes automated exposure control, adjustment of the mA and/or kV according to patient size and/or use of iterative reconstruction technique. Informed written consent was obtained from the patient after a discussion of the risks, benefits and alternatives to treatment. The patient was placed supine on the CT gantry and a pre procedural CT was performed re-demonstrating the known abscess/fluid collection within the subcapsular right lobe of the liver. The procedure was planned. A timeout was performed prior to the initiation of the procedure. The right upper quadrant was prepped and draped in the usual sterile fashion. The overlying soft tissues were anesthetized with 1% lidocaine  with epinephrine. Appropriate trajectory was planned with the use of a 22 gauge spinal needle. An 18 gauge trocar needle was advanced into the abscess/fluid collection and a short Amplatz super stiff wire was coiled within the collection. Appropriate positioning was confirmed with a limited CT scan. The tract was serially dilated allowing placement of a 10.2 [place] all-purpose drainage catheter. Appropriate positioning was confirmed with a limited postprocedural CT scan. Approximately 10 ml of sanguinopurulent fluid was aspirated. The tube was connected to a bulb suction and sutured in place. A dressing was placed. The patient tolerated the procedure well without immediate post procedural complication. IMPRESSION: Successful CT guided placement of a 10.2 [place] all purpose drain catheter into the right subcapsular hepatic abscess with aspiration of 10 mL of purulent fluid. Samples were sent to the laboratory as requested by the ordering clinical team. Ruthann Cancer, MD [facility] Radiology Electronically Signed   By: Ruthann Cancer M.D.   On: 02/04/2022 12:19   CT Abdomen Pelvis W Contrast  Result Date: 02/03/2022 CLINICAL DATA:  Postoperative abdominal pain, status post cholecystectomy, persistent pain, no drainage from drainage catheter EXAM: CT ABDOMEN AND PELVIS WITH CONTRAST TECHNIQUE: Multidetector CT imaging of the abdomen and pelvis was performed using the standard protocol following bolus administration of intravenous contrast. RADIATION DOSE REDUCTION: This exam was performed according to the departmental dose-optimization program which includes automated exposure control, adjustment of the mA and/or kV according to patient size and/or use of iterative  reconstruction technique. CONTRAST:  145m OMNIPAQUE IOHEXOL 300 MG/ML SOLN, additional oral enteric contrast COMPARISON:  01/27/2022 FINDINGS: Lower chest: Small right, trace left pleural effusions with  associated atelectasis or consolidation. Cardiomegaly and coronary artery calcifications. Aortic valve calcifications. Hepatobiliary: No focal liver abnormality is seen. Status post cholecystectomy. Intra and extrahepatic biliary ductal dilatation, the common bile duct measuring up to 1.0 cm (series 5, image 53). Surgical drain is positioned in the gallbladder fossa with a small air and fluid collection in the gallbladder fossa measuring 2.7 x 1.8 cm (series 2, image 31). Additional small fluid collections about the right lobe of the liver, laterally measuring 5.7 x 2.0 cm (series 2, image 36) and anteriorly measuring 5.0 x 1.2 cm (series 2, image 36). Pancreas: Unremarkable. No pancreatic ductal dilatation or surrounding inflammatory changes. Spleen: Normal in size without significant abnormality. Adrenals/Urinary Tract: Adrenal glands are unremarkable. Kidneys are normal, without renal calculi, solid lesion, or hydronephrosis. Bladder is unremarkable. Stomach/Bowel: Stomach is within normal limits. Appendix appears normal. Wall thickening of the hepatic flexure and transverse colon in the right upper quadrant (series 2, image 38). Vascular/Lymphatic: Aortic atherosclerosis. No enlarged abdominal or pelvic lymph nodes. Reproductive: No mass or other significant abnormality. Other: No abdominal wall hernia or abnormality. No ascites. Musculoskeletal: No acute or significant osseous findings. IMPRESSION: 1. Status post cholecystectomy. Surgical drain is positioned in the gallbladder fossa. 2. Multiple air and fluid collections in the gallbladder fossa and about the right lobe of the liver, concerning for postoperative infection. The presence or absence of infection within these collections is however not established by CT. 3. Intra and extrahepatic biliary ductal dilatation, the common bile duct measuring up to 1.0 cm. 4. Wall thickening of the hepatic flexure and transverse colon in the right upper quadrant, likely  reactive to adjacent inflammation. 5. Small right, trace left pleural effusions with associated atelectasis or consolidation. 6. Cardiomegaly and coronary artery disease. Aortic Atherosclerosis (ICD10-I70.0). Electronically Signed   By: ADelanna AhmadiM.D.   On: 02/03/2022 13:23    Labs:  CBC: Recent Labs    02/03/22 1102 02/04/22 0649 02/05/22 0504 02/06/22 0500  WBC 19.2* 17.7* 18.7* 14.9*  HGB 10.9* 9.8* 10.9* 10.5*  HCT 33.9* 30.1* 33.6* 32.4*  PLT 539* 447* 439* 400    COAGS: Recent Labs    02/04/22 0934  INR 1.2    BMP: Recent Labs    01/28/22 0428 02/03/22 1102 02/04/22 0649 02/06/22 0500  NA 135 141 139 138  K 4.0 4.0 3.5 3.3*  CL 104 102 104 104  CO2 '24 27 25 28  '$ GLUCOSE 225* 113* 69* 105*  BUN 10 7* 7* 10  CALCIUM 8.0* 8.8* 7.9* 7.7*  CREATININE 0.77 0.56 0.54 0.63  GFRNONAA >60 >60 >60 >60    LIVER FUNCTION TESTS: Recent Labs    01/27/22 1306 01/28/22 0428 02/03/22 1102 02/04/22 0649  BILITOT 1.1 0.6 0.8 1.1  AST 44* 62* 31 25  ALT 41 50* 34 26  ALKPHOS 118 90 89 70  PROT 6.3* 5.4* 5.8* 5.0*  ALBUMIN 2.8* 2.3* 2.3* 1.9*    Assessment and Plan: Pt s/p laparoscopic cholecystectomy with JP drain placement on 01/27/22 for acute gangrenous cholecystitis; now s/p hepatic abscess drain placement 11/8 (10 fr to JP); afebrile ; liver absc cx- e coli/klebsiella; WBC 14.9(18.7), hgb stable, creat nl; cont with drain flushes tid, close OP monitoring, lab checks; once OP minimal over 2-3 day span will plan to obtain f/u CT  Electronically Signed: D. Rowe Robert, PA-C 02/06/2022, 5:17 PM   I spent a total of 15 Minutes at the the patient's bedside AND on the patient's hospital floor or unit, greater than 50% of which was counseling/coordinating care for hepatic abscess drain    Patient ID: Alexis Hines, female   DOB: 07/09/1933, 88 y.o.   MRN: [id number]

## 2022-02-06 NOTE — Progress Notes (Signed)
Mobility Specialist - Progress Note   02/06/22 1232  Mobility  Activity Ambulated with assistance in hallway  Level of Assistance Standby assist, set-up cues, supervision of patient - no hands on  Assistive Device Front wheel walker  Distance Ambulated (ft) 275 ft  Activity Response Tolerated well  Mobility Referral Yes  $Mobility charge 1 Mobility   Pt received in bed and agreed to mobility, no c/o pain nor discomfort during session. Pt returned to chair with all needs met.  Roderick Pee Mobility Specialist

## 2022-02-06 NOTE — Progress Notes (Addendum)
PROGRESS NOTE    Alexis Hines  PYP:[id number] DOB: 1933-04-11 DOA: 02/03/2022 PCP: Rita Ohara, MD   Brief Narrative: Alexis Hines is a 88 y.o. female with a history of osteoarthritis, basal cell cancer of the nose, diverticulosis, migraine headaches, paroxysmal SVT, grade 1 diastolic dysfunction who presented secondary to decreased JP drain output and worsening abdominal pain.  She was found to have evidence of postoperative infection which appears to be secondary to inadequate coverage of bacterial infection.  Culture results.  General surgery consulted on admission.  Patient started on Zosyn IV for coverage.  Interventional radiology consulted for liver abscess drain placement.   Assessment and Plan:  Postoperative infection In setting of recent laparoscopic cholecystectomy.  Patient was discharged on Augmentin, however intraoperative liver abscess culture significant for sensitive E. coli. and ampicillin/Unasyn/cefazolin resistant Klebsiella pneumoniae.  CT imaging on admission significant for multiple small liver abscesses. General surgery consulted on admission.  Patient started on Zosyn for management.  Surgery recommending interventional radiology for liver drain which was successfully placed on 11/8. Wound culture (11/8) significant for E. Coli intermediate to ampicillin. 19 Fr gallbladder fossa drain with 1 mL output and 10.2 Fr liver abscess drain with 25 mL documented over the last 24 hours -ID recommendations: Discontinue Zosyn IV and start Ceftriaxone IV and Flagyl IV  Leukocytosis Blood cell count of 19,200 on admission.  Secondary to postoperative infection. No associated fevers. Downtrend to 14,900 today.  Rales Patient appears to be asymptomatic at this time.  History of diastolic heart failure. Chest x-ray with some atelectasis. Rales resolved. -Incentive spirometer  Hypercholesterolemia Patient is on Lipitor as an outpatient which was held on  admission. -Continue Lipitor 40 mg daily  Paroxysmal SVT Patient is managed on Toprol-XL 50 mg daily.  Currently rate controlled.  Started on metoprolol tartrate IV while inpatient. -Continue Toprol-XL 50 mg daily  Chronic diastolic heart failure Patient is managed on Toprol-XL as an outpatient.  Patient is not on ACEi/ARB.  Patient with evidence of possible rales in addition to edema. -Strict in and out and daily weights  Hypoalbuminemia Unsure if patient has malnutrition based off of albumin levels. -Dietitian recommendations (11/9): Ensure Plus High Protein po BID, each supplement provides 350 kcal and 20 grams of protein.    Normocytic anemia Slight drift in setting of recent surgery. Stable.  Thrombocytosis Likely reactive secondary to infection. Slowly improving.   DVT prophylaxis: SCDs Code Status:   Code Status: Full Code Family Communication: None at bedside. Disposition Plan: Discharge home pending general surgery/ID recommendations for outpatient management   Consultants:  General surgery Interventional radiology Infectious disease  Procedures:  None  Antimicrobials: Zosyn IV   Subjective: Patient reports continued abdominal pain. She is happy that she has been eating more. She does have some increased frequency of soft stools.  Objective: BP (!) 140/68 (BP Location: Right Arm)   Pulse 90   Temp 98.6 F (37 C) (Oral)   Resp 17   Ht '4\' 10"'$  (1.473 m)   Wt 56.7 kg   SpO2 94%   BMI 26.13 kg/m   Examination:  General exam: Appears calm and comfortable Respiratory system: Clear to auscultation. Respiratory effort normal. Cardiovascular system: S1 & S2 heard, RRR. Gastrointestinal system: Abdomen is nondistended, soft and non-tender. No organomegaly or masses felt. Normal bowel sounds heard. Central nervous system: Alert and oriented. No focal neurological deficits. Musculoskeletal: No calf tenderness Skin: No cyanosis. No rashes Psychiatry:  Judgement and insight appear normal. Mood &  affect appropriate.    Data Reviewed: I have personally reviewed following labs and imaging studies  CBC Lab Results  Component Value Date   WBC 14.9 (H) 02/06/2022   RBC 3.56 (L) 02/06/2022   HGB 10.5 (L) 02/06/2022   HCT 32.4 (L) 02/06/2022   MCV 91.0 02/06/2022   MCH 29.5 02/06/2022   PLT 400 02/06/2022   MCHC 32.4 02/06/2022   RDW 14.4 02/06/2022   LYMPHSABS 1.5 02/03/2022   MONOABS 1.8 (H) 02/03/2022   EOSABS 0.1 02/03/2022   BASOSABS 0.1 33/35/4562     Last metabolic panel Lab Results  Component Value Date   NA 138 02/06/2022   K 3.3 (L) 02/06/2022   CL 104 02/06/2022   CO2 28 02/06/2022   BUN 10 02/06/2022   CREATININE 0.63 02/06/2022   GLUCOSE 105 (H) 02/06/2022   GFRNONAA >60 02/06/2022   GFRAA 78 11/09/2019   CALCIUM 7.7 (L) 02/06/2022   PROT 5.0 (L) 02/04/2022   ALBUMIN 1.9 (L) 02/04/2022   LABGLOB 2.3 01/26/2022   AGRATIO 1.5 01/26/2022   BILITOT 1.1 02/04/2022   ALKPHOS 70 02/04/2022   AST 25 02/04/2022   ALT 26 02/04/2022   ANIONGAP 6 02/06/2022    GFR: Estimated Creatinine Clearance: 37.6 mL/min (by C-G formula based on SCr of 0.63 mg/dL).  Recent Results (from the past 240 hour(s))  Aerobic/Anaerobic Culture w Gram Stain (surgical/deep wound)     Status: None   Collection Time: 01/27/22  2:04 PM   Specimen: PATH Other; Tissue  Result Value Ref Range Status   Specimen Description   Final    ABSCESS LIVER Performed at [facility] [address] [place], [place] [address]    Special Requests   Final    NONE Performed at [facility], [place] [address] [place], [place] [address]    Gram Stain   Final    MODERATE WBC PRESENT,BOTH PMN AND MONONUCLEAR FEW GRAM NEGATIVE RODS    Culture   Final    ABUNDANT ESCHERICHIA COLI ABUNDANT KLEBSIELLA PNEUMONIAE NO ANAEROBES ISOLATED Performed at [facility], [address], [place], [place] [address]     Report Status 02/01/2022 FINAL  Final   Organism ID, Bacteria ESCHERICHIA COLI  Final   Organism ID, Bacteria KLEBSIELLA PNEUMONIAE  Final      Susceptibility   Escherichia coli - MIC*    AMPICILLIN <=2 SENSITIVE Sensitive     CEFAZOLIN <=4 SENSITIVE Sensitive     CEFEPIME <=0.12 SENSITIVE Sensitive     CEFTAZIDIME <=1 SENSITIVE Sensitive     CEFTRIAXONE <=0.25 SENSITIVE Sensitive     CIPROFLOXACIN <=0.25 SENSITIVE Sensitive     GENTAMICIN <=1 SENSITIVE Sensitive     IMIPENEM <=0.25 SENSITIVE Sensitive     TRIMETH/SULFA <=20 SENSITIVE Sensitive     AMPICILLIN/SULBACTAM <=2 SENSITIVE Sensitive     PIP/TAZO <=4 SENSITIVE Sensitive     * ABUNDANT ESCHERICHIA COLI   Klebsiella pneumoniae - MIC*    AMPICILLIN >=32 RESISTANT Resistant     CEFAZOLIN >=64 RESISTANT Resistant     CEFEPIME <=0.12 SENSITIVE Sensitive     CEFTAZIDIME <=1 SENSITIVE Sensitive     CEFTRIAXONE <=0.25 SENSITIVE Sensitive     CIPROFLOXACIN <=0.25 SENSITIVE Sensitive     GENTAMICIN <=1 SENSITIVE Sensitive     IMIPENEM 0.5 SENSITIVE Sensitive     TRIMETH/SULFA <=20 SENSITIVE Sensitive     AMPICILLIN/SULBACTAM >=32 RESISTANT Resistant     PIP/TAZO <=4 SENSITIVE Sensitive     *  ABUNDANT KLEBSIELLA PNEUMONIAE  Aerobic/Anaerobic Culture w Gram Stain (surgical/deep wound)     Status: None (Preliminary result)   Collection Time: 02/04/22 11:35 AM   Specimen: Liver; Abscess  Result Value Ref Range Status   Specimen Description   Final    LIVER ABSCESS Performed at [facility] [address], [place], [place] [address]    Special Requests   Final    NONE Performed at [facility], [place] [address], [place], [place] [address]    Gram Stain   Final    ABUNDANT WBC PRESENT, PREDOMINANTLY PMN NO ORGANISMS SEEN Performed at [facility], [place] [address] [place], [place] [address]    Culture FEW ESCHERICHIA COLI  Final   Report Status PENDING  Incomplete   Organism ID,  Bacteria ESCHERICHIA COLI  Final      Susceptibility   Escherichia coli - MIC*    AMPICILLIN 16 INTERMEDIATE Intermediate     CEFAZOLIN <=4 SENSITIVE Sensitive     CEFEPIME <=0.12 SENSITIVE Sensitive     CEFTAZIDIME <=1 SENSITIVE Sensitive     CEFTRIAXONE <=0.25 SENSITIVE Sensitive     CIPROFLOXACIN <=0.25 SENSITIVE Sensitive     GENTAMICIN <=1 SENSITIVE Sensitive     IMIPENEM <=0.25 SENSITIVE Sensitive     TRIMETH/SULFA <=20 SENSITIVE Sensitive     AMPICILLIN/SULBACTAM 4 SENSITIVE Sensitive     PIP/TAZO <=4 SENSITIVE Sensitive     * FEW ESCHERICHIA COLI      Radiology Studies: DG CHEST PORT 1 VIEW  Result Date: 02/04/2022 CLINICAL DATA:  Physical exam abnormality EXAM: PORTABLE CHEST 1 VIEW COMPARISON:  10/24/2013 FINDINGS: Left shoulder arthroplasty. Numerous leads and wires project over the chest. Midline trachea. Moderate cardiomegaly. Atherosclerosis in the transverse aorta. No pleural effusion or pneumothorax. No congestive failure. Clear right lung. The left hemidiaphragm is partially obscured. IMPRESSION: Left hemidiaphragm obscuration, suggesting left lower lobe airspace disease. This could represent atelectasis or infection. Consider radiographic follow-up to confirm resolution. Mild cardiomegaly, without congestive failure. Aortic Atherosclerosis (ICD10-I70.0). Electronically Signed   By: Abigail Miyamoto M.D.   On: 02/04/2022 14:18   CT GUIDED VISCERAL FLUID DRAIN BY PERC CATH  Result Date: 02/04/2022 INDICATION: 88 year old female status post recent cholecystectomy with subcapsular Paddock fluid collection concerning for abscess. EXAM: CT IMAGE GUIDED DRAINAGE BY PERCUTANEOUS CATHETER COMPARISON:  02/03/2022 MEDICATIONS: The patient is currently admitted to the hospital and receiving intravenous antibiotics. The antibiotics were administered within an appropriate time frame prior to the initiation of the procedure. ANESTHESIA/SEDATION: Moderate (conscious) sedation was employed  during this procedure. A total of Versed 2 mg and Fentanyl 100 mcg was administered intravenously. Moderate Sedation Time: 21 minutes. The patient's level of consciousness and vital signs were monitored continuously by radiology nursing throughout the procedure under my direct supervision. CONTRAST:  None COMPLICATIONS: None immediate. PROCEDURE: RADIATION DOSE REDUCTION: This exam was performed according to the departmental dose-optimization program which includes automated exposure control, adjustment of the mA and/or kV according to patient size and/or use of iterative reconstruction technique. Informed written consent was obtained from the patient after a discussion of the risks, benefits and alternatives to treatment. The patient was placed supine on the CT gantry and a pre procedural CT was performed re-demonstrating the known abscess/fluid collection within the subcapsular right lobe of the liver. The procedure was planned. A timeout was performed prior to the initiation of the procedure. The right upper quadrant was prepped and draped in the usual sterile fashion. The  overlying soft tissues were anesthetized with 1% lidocaine with epinephrine. Appropriate trajectory was planned with the use of a 22 gauge spinal needle. An 18 gauge trocar needle was advanced into the abscess/fluid collection and a short Amplatz super stiff wire was coiled within the collection. Appropriate positioning was confirmed with a limited CT scan. The tract was serially dilated allowing placement of a 10.2 [place] all-purpose drainage catheter. Appropriate positioning was confirmed with a limited postprocedural CT scan. Approximately 10 ml of sanguinopurulent fluid was aspirated. The tube was connected to a bulb suction and sutured in place. A dressing was placed. The patient tolerated the procedure well without immediate post procedural complication. IMPRESSION: Successful CT guided placement of a 10.2 [place] all purpose drain  catheter into the right subcapsular hepatic abscess with aspiration of 10 mL of purulent fluid. Samples were sent to the laboratory as requested by the ordering clinical team. Ruthann Cancer, MD [facility] Electronically Signed   By: Ruthann Cancer M.D.   On: 02/04/2022 12:19      LOS: 3 days    Cordelia Poche, MD [facility] 02/06/2022, 10:36 AM   If 7PM-7AM, please contact night-coverage [email or web address]

## 2022-02-06 NOTE — TOC Initial Note (Signed)
Transition of Care [facility]) - Initial/Assessment Note    Patient Details  Name: Alexis Hines MRN: [id number] Date of Birth: 08/20/1933  Transition of Care [facility]) CM/SW Contact:    Angelita Ingles, RN Phone [phone]  02/06/2022, 1:39 PM  Clinical Narrative:                  Transition of Care [facility]) Screening Note   Patient Details  Name: Alexis Hines Date of Birth: 1933/07/12   Transition of Care [facility]) CM/SW Contact:    Angelita Ingles, RN Phone Number: 02/06/2022, 1:39 PM    Transition of Care Department [facility]) has reviewed patient and no TOC needs have been identified at this time. We will continue to monitor patient advancement through interdisciplinary progression rounds. If new patient transition needs arise, please place a TOC consult.           Patient Goals and CMS Choice        Expected Discharge Plan and Services                                                Prior Living Arrangements/Services                       Activities of Daily Living Home Assistive Devices/Equipment: Hearing aid, Environmental consultant (specify type), Eyeglasses (bilateral hearing aides, jp drain, reading glasses) ADL Screening (condition at time of admission) Patient's cognitive ability adequate to safely complete daily activities?: Yes Is the patient deaf or have difficulty hearing?: Yes Does the patient have difficulty seeing, even when wearing glasses/contacts?: No Does the patient have difficulty concentrating, remembering, or making decisions?: Yes Patient able to express need for assistance with ADLs?: Yes Does the patient have difficulty dressing or bathing?: Yes Independently performs ADLs?: No Communication: Independent Dressing (OT): Independent Grooming: Independent Feeding: Independent Bathing: Needs assistance Is this a change from baseline?: Pre-admission baseline Toileting: Needs assistance Is this a change from baseline?: Pre-admission  baseline In/Out Bed: Needs assistance Is this a change from baseline?: Pre-admission baseline Walks in Home: Needs assistance (needs assistance and uses a walker) Is this a change from baseline?: Pre-admission baseline Does the patient have difficulty walking or climbing stairs?: Yes Weakness of Legs: Both Weakness of Arms/Hands: Both  Permission Sought/Granted                  Emotional Assessment              Admission diagnosis:  Postoperative infection, initial encounter [T81.40XA] Infection of organ or organ space after surgery, initial encounter [T81.43XA] Patient Active Problem List   Diagnosis Date Noted   Liver abscess 02/06/2022   Malnutrition of moderate degree 02/05/2022   Postoperative infection, initial encounter 02/03/2022   Grade I diastolic dysfunction 60/63/0160   Protein-calorie malnutrition, severe ([place]) 02/03/2022   Normocytic anemia 02/03/2022   Gangrenous cholecystitis 01/27/2022   S/P reverse total shoulder arthroplasty, left 12/26/2020   Paroxysmal SVT (supraventricular tachycardia) 12/14/2020   Chronic rhinitis 12/14/2020   Osteoarthritis of both shoulders 12/14/2020   Overweight (BMI 25.0-29.9) 10/11/2019   Osteoarthritis of right hip 10/10/2019   Status post right hip replacement 10/10/2019   De Quervain's tenosynovitis 12/07/2018   Primary osteoarthritis of right knee 09/03/2017   S/P knee replacement 09/03/2017   Generalized anxiety disorder 02/04/2017   Precordial  pain 10/26/2013   Bilateral dry eyes 10/05/2013   Dermatochalasis, bilateral 10/05/2013   Hip arthritis 12/29/2012   Allergic rhinitis, cause unspecified 08/26/2011   Heme + stool 12/23/2010   Pure hypercholesterolemia 11/26/2010   PCP:  Rita Ohara, MD Pharmacy:   [facility] [place], [address] [place] [address] Phone: [phone] Fax: [phone]     Social  Determinants of Health (SDOH) Interventions    Readmission Risk Interventions     No data to display

## 2022-02-06 NOTE — Progress Notes (Signed)
Progress Note     Subjective: Pain control much better on tylenol/ibuprofen. Tolerating diet though continued low appetite. Has had several loose Bms.  Objective: Vital signs in last 24 hours: Temp:  [97.9 F (36.6 C)-98.6 F (37 C)] 98.6 F (37 C) (11/10 0547) Pulse Rate:  [66-90] 90 (11/10 0547) Resp:  [17-20] 17 (11/10 0547) BP: (102-140)/(59-68) 140/68 (11/10 0547) SpO2:  [86 %-94 %] 94 % (11/10 0547) Last BM Date : 02/05/22  Intake/Output from previous day: 11/09 0701 - 11/10 0700 In: 480 [P.O.:420; IV Piggyback:50] Out: 26 [Drains:26] Intake/Output this shift: No intake/output data recorded.  PE: General: pleasant, WD, female who is laying in bed in NAD Lungs: Respiratory effort nonlabored on room air Abd: soft, +BS. No distension. mild TTP over incisions and RUQ. No rebound or guarding. Incisions with c/d/I with surgical glue and mild ecchymosis. JP drain with scant old SS output. IR drain with scant SS output Skin: warm and dry with no masses, lesions, or rashes. Calves soft and nontender Psych: A&Ox3 with an appropriate affect.    Lab Results:  Recent Labs    02/05/22 0504 02/06/22 0500  WBC 18.7* 14.9*  HGB 10.9* 10.5*  HCT 33.6* 32.4*  PLT 439* 400    BMET Recent Labs    02/04/22 0649 02/06/22 0500  NA 139 138  K 3.5 3.3*  CL 104 104  CO2 25 28  GLUCOSE 69* 105*  BUN 7* 10  CREATININE 0.54 0.63  CALCIUM 7.9* 7.7*    PT/INR Recent Labs    02/04/22 0934  LABPROT 15.2  INR 1.2    CMP     Component Value Date/Time   NA 138 02/06/2022 0500   NA 135 01/26/2022 1202   K 3.3 (L) 02/06/2022 0500   CL 104 02/06/2022 0500   CO2 28 02/06/2022 0500   GLUCOSE 105 (H) 02/06/2022 0500   BUN 10 02/06/2022 0500   BUN 11 01/26/2022 1202   CREATININE 0.63 02/06/2022 0500   CALCIUM 7.7 (L) 02/06/2022 0500   PROT 5.0 (L) 02/04/2022 0649   PROT 5.8 (L) 01/26/2022 1202   ALBUMIN 1.9 (L) 02/04/2022 0649   ALBUMIN 3.5 (L) 01/26/2022 1202   AST  25 02/04/2022 0649   ALT 26 02/04/2022 0649   ALKPHOS 70 02/04/2022 0649   BILITOT 1.1 02/04/2022 0649   BILITOT 0.9 01/26/2022 1202   GFRNONAA >60 02/06/2022 0500   GFRAA 78 11/09/2019 0947   Lipase     Component Value Date/Time   LIPASE 25 02/03/2022 1102       Studies/Results: DG CHEST PORT 1 VIEW  Result Date: 02/04/2022 CLINICAL DATA:  Physical exam abnormality EXAM: PORTABLE CHEST 1 VIEW COMPARISON:  10/24/2013 FINDINGS: Left shoulder arthroplasty. Numerous leads and wires project over the chest. Midline trachea. Moderate cardiomegaly. Atherosclerosis in the transverse aorta. No pleural effusion or pneumothorax. No congestive failure. Clear right lung. The left hemidiaphragm is partially obscured. IMPRESSION: Left hemidiaphragm obscuration, suggesting left lower lobe airspace disease. This could represent atelectasis or infection. Consider radiographic follow-up to confirm resolution. Mild cardiomegaly, without congestive failure. Aortic Atherosclerosis (ICD10-I70.0). Electronically Signed   By: Abigail Miyamoto M.D.   On: 02/04/2022 14:18   CT GUIDED VISCERAL FLUID DRAIN BY PERC CATH  Result Date: 02/04/2022 INDICATION: 88 year old female status post recent cholecystectomy with subcapsular Paddock fluid collection concerning for abscess. EXAM: CT IMAGE GUIDED DRAINAGE BY PERCUTANEOUS CATHETER COMPARISON:  02/03/2022 MEDICATIONS: The patient is currently admitted to the hospital and receiving intravenous  antibiotics. The antibiotics were administered within an appropriate time frame prior to the initiation of the procedure. ANESTHESIA/SEDATION: Moderate (conscious) sedation was employed during this procedure. A total of Versed 2 mg and Fentanyl 100 mcg was administered intravenously. Moderate Sedation Time: 21 minutes. The patient's level of consciousness and vital signs were monitored continuously by radiology nursing throughout the procedure under my direct supervision. CONTRAST:  None  COMPLICATIONS: None immediate. PROCEDURE: RADIATION DOSE REDUCTION: This exam was performed according to the departmental dose-optimization program which includes automated exposure control, adjustment of the mA and/or kV according to patient size and/or use of iterative reconstruction technique. Informed written consent was obtained from the patient after a discussion of the risks, benefits and alternatives to treatment. The patient was placed supine on the CT gantry and a pre procedural CT was performed re-demonstrating the known abscess/fluid collection within the subcapsular right lobe of the liver. The procedure was planned. A timeout was performed prior to the initiation of the procedure. The right upper quadrant was prepped and draped in the usual sterile fashion. The overlying soft tissues were anesthetized with 1% lidocaine with epinephrine. Appropriate trajectory was planned with the use of a 22 gauge spinal needle. An 18 gauge trocar needle was advanced into the abscess/fluid collection and a short Amplatz super stiff wire was coiled within the collection. Appropriate positioning was confirmed with a limited CT scan. The tract was serially dilated allowing placement of a 10.2 [place] all-purpose drainage catheter. Appropriate positioning was confirmed with a limited postprocedural CT scan. Approximately 10 ml of sanguinopurulent fluid was aspirated. The tube was connected to a bulb suction and sutured in place. A dressing was placed. The patient tolerated the procedure well without immediate post procedural complication. IMPRESSION: Successful CT guided placement of a 10.2 [place] all purpose drain catheter into the right subcapsular hepatic abscess with aspiration of 10 mL of purulent fluid. Samples were sent to the laboratory as requested by the ordering clinical team. Ruthann Cancer, MD [facility] Electronically Signed   By: Ruthann Cancer M.D.    On: 02/04/2022 12:19    Anti-infectives: Anti-infectives (From admission, onward)    Start     Dose/Rate Route Frequency Ordered Stop   02/03/22 1800  piperacillin-tazobactam (ZOSYN) IVPB 3.375 g        3.375 g 12.5 mL/hr over 240 Minutes Intravenous Every 8 hours 02/03/22 1519     02/03/22 1515  piperacillin-tazobactam (ZOSYN) IVPB 3.375 g  Status:  Discontinued        3.375 g 100 mL/hr over 30 Minutes Intravenous Every 8 hours 02/03/22 1513 02/03/22 1519   02/03/22 1130  piperacillin-tazobactam (ZOSYN) IVPB 3.375 g        3.375 g 100 mL/hr over 30 Minutes Intravenous  Once 02/03/22 1127 02/03/22 1231        Assessment/Plan Abdominal pain, leukocytosis POD10 status post laparoscopic cholecystectomy with JP drain placement 10/31 Dr. Thermon Leyland for gangrenous cholecystitis with intra-op finding of liver abscess -WBC improved 14.9. afebrile. ID following and rocephin/flagyl - intra-op culture of liver abscess 10/31 with e coli and klebsiella with resistance to ampicillin. - CT 11/7 with additional fluid collections noted - s/p IR drain 11/8 - IR drain output 25, gram neg rods so far. Culture with e. coli - JP drain output 1 ml, continue drain for now but likely out soon - pain medications adjusted 11/9 and pain much improved - no opioid pain medication needed in last 24 hours -  having loose Bms - metamucil per TRH  FEN: regular, ensure ID: [email or web address]>rocephin/flagyl VTE: okay for chemic prophylaxis from surgical standpoint   LOS: 3 days   Winferd Humphrey, [facility] 02/06/2022, 9:39 AM Please see Amion for pager number during day hours 7:00am-4:30pm

## 2022-02-07 DIAGNOSIS — I471 Supraventricular tachycardia, unspecified: Secondary | ICD-10-CM | POA: Diagnosis not present

## 2022-02-07 DIAGNOSIS — I5189 Other ill-defined heart diseases: Secondary | ICD-10-CM | POA: Diagnosis not present

## 2022-02-07 DIAGNOSIS — T8140XA Infection following a procedure, unspecified, initial encounter: Secondary | ICD-10-CM | POA: Diagnosis not present

## 2022-02-07 DIAGNOSIS — D649 Anemia, unspecified: Secondary | ICD-10-CM | POA: Diagnosis not present

## 2022-02-07 LAB — CBC
HCT: 36.9 % (ref 36.0–46.0)
Hemoglobin: 11.8 g/dL — ABNORMAL LOW (ref 12.0–15.0)
MCH: 29.6 pg (ref 26.0–34.0)
MCHC: 32 g/dL (ref 30.0–36.0)
MCV: 92.7 fL (ref 80.0–100.0)
Platelets: 411 10*3/uL — ABNORMAL HIGH (ref 150–400)
RBC: 3.98 MIL/uL (ref 3.87–5.11)
RDW: 14.6 % (ref 11.5–15.5)
WBC: 15.2 10*3/uL — ABNORMAL HIGH (ref 4.0–10.5)
nRBC: 0 % (ref 0.0–0.2)

## 2022-02-07 LAB — BASIC METABOLIC PANEL
Anion gap: 5 (ref 5–15)
BUN: 9 mg/dL (ref 8–23)
CO2: 25 mmol/L (ref 22–32)
Calcium: 8.2 mg/dL — ABNORMAL LOW (ref 8.9–10.3)
Chloride: 107 mmol/L (ref 98–111)
Creatinine, Ser: 0.56 mg/dL (ref 0.44–1.00)
GFR, Estimated: >60 mL/min (ref >60)
Glucose, Bld: 123 mg/dL — ABNORMAL HIGH (ref 70–99)
Potassium: 4.2 mmol/L (ref 3.5–5.1)
Sodium: 137 mmol/L (ref 135–145)

## 2022-02-07 NOTE — Progress Notes (Signed)
Subjective/Chief Complaint: Pt with poor appetite  Pain better    Objective: Vital signs in last 24 hours: Temp:  [97.6 F (36.4 C)-98.2 F (36.8 C)] 97.6 F (36.4 C) (11/11 0443) Pulse Rate:  [80-104] 80 (11/11 0443) Resp:  [16-18] 16 (11/11 0443) BP: (139-143)/(63-86) 143/86 (11/11 0443) SpO2:  [95 %-97 %] 95 % (11/11 0443) Last BM Date : 02/06/22  Intake/Output from previous day: 11/10 0701 - 11/11 0700 In: 345 [P.O.:240; IV Piggyback:100] Out: 10 [Drains:10] Intake/Output this shift: No intake/output data recorded.   General: pleasant, WD, female who is laying in bed in NAD Lungs: Respiratory effort nonlabored on room air Abd: soft, +BS. No distension. mild TTP over incisions and RUQ. No rebound or guarding. Incisions with c/d/I with surgical glue and mild ecchymosis. JP drain with scant old SS output. IR drain with scant SS output  Lab Results:  Recent Labs    02/05/22 0504 02/06/22 0500  WBC 18.7* 14.9*  HGB 10.9* 10.5*  HCT 33.6* 32.4*  PLT 439* 400   BMET Recent Labs    02/06/22 0500  NA 138  K 3.3*  CL 104  CO2 28  GLUCOSE 105*  BUN 10  CREATININE 0.63  CALCIUM 7.7*   PT/INR Recent Labs    02/04/22 0934  LABPROT 15.2  INR 1.2   ABG No results for input(s): "PHART", "HCO3" in the last 72 hours.  Invalid input(s): "PCO2", "PO2"  Studies/Results: No results found.  Anti-infectives: Anti-infectives (From admission, onward)    Start     Dose/Rate Route Frequency Ordered Stop   02/06/22 1045  cefTRIAXone (ROCEPHIN) 2 g in sodium chloride 0.9 % 100 mL IVPB        2 g 200 mL/hr over 30 Minutes Intravenous Daily 02/06/22 0945     02/06/22 1045  metroNIDAZOLE (FLAGYL) tablet 500 mg        500 mg Oral Every 12 hours 02/06/22 0945     02/03/22 1800  piperacillin-tazobactam (ZOSYN) IVPB 3.375 g  Status:  Discontinued        3.375 g 12.5 mL/hr over 240 Minutes Intravenous Every 8 hours 02/03/22 1519 02/06/22 0945   02/03/22 1515   piperacillin-tazobactam (ZOSYN) IVPB 3.375 g  Status:  Discontinued        3.375 g 100 mL/hr over 30 Minutes Intravenous Every 8 hours 02/03/22 1513 02/03/22 1519   02/03/22 1130  piperacillin-tazobactam (ZOSYN) IVPB 3.375 g        3.375 g 100 mL/hr over 30 Minutes Intravenous  Once 02/03/22 1127 02/03/22 1231       Assessment/Plan: Abdominal pain, leukocytosis POD11 status post laparoscopic cholecystectomy with JP drain placement 10/31 Dr. Thermon Leyland for gangrenous cholecystitis with intra-op finding of liver abscess -WBC improved 14.9. afebrile. ID following and rocephin/flagyl - intra-op culture of liver abscess 10/31 with e coli and klebsiella with resistance to ampicillin. - CT 11/7 with additional fluid collections noted - s/p IR drain 11/8 - IR drain , gram neg rods so far. Culture with e. coli - JP drain  continue drain for now but likely out soon - pain medications adjusted 11/9 and pain much improved - no opioid pain medication needed in last 24 hours - having loose Bms - metamucil per TR[facility]   FEN: regular, ensure ID: [email or web address]>rocephin/flagyl VTE: okay for chemic prophylaxis from surgical standpoint Dispo: po intake poor  Encourage po intake  Home once adequate po intake   LOS: 4 days    Turner Daniels MD  02/07/2022  

## 2022-02-07 NOTE — Progress Notes (Signed)
Mobility Specialist - Progress Note   02/07/22 0939  Mobility  Activity Ambulated with assistance in hallway;Ambulated with assistance to bathroom  Level of Assistance Standby assist, set-up cues, supervision of patient - no hands on  Assistive Device Front wheel walker  Distance Ambulated (ft) 500 ft  Activity Response Tolerated well  Mobility Referral Yes  $Mobility charge 1 Mobility   Pt received in bed and agreeable to mobility. No complaints during mobility. Assisted pt w/ bathroom ADL's/BM upon returning. Pt to bed after session with all needs met.     [facility]

## 2022-02-07 NOTE — Progress Notes (Signed)
PROGRESS NOTE    Alexis Hines  QBH:[id number] DOB: 11-Jan-1934 DOA: 02/03/2022 PCP: Rita Ohara, MD   Brief Narrative: Alexis Hines is a 88 y.o. female with a history of osteoarthritis, basal cell cancer of the nose, diverticulosis, migraine headaches, paroxysmal SVT, grade 1 diastolic dysfunction who presented secondary to decreased JP drain output and worsening abdominal pain.  She was found to have evidence of postoperative infection which appears to be secondary to inadequate coverage of bacterial infection.  Culture results.  General surgery consulted on admission.  Patient started on Zosyn IV for coverage.  Interventional radiology consulted for liver abscess drain placement.   Assessment and Plan:  Postoperative infection In setting of recent laparoscopic cholecystectomy.  Patient was discharged on Augmentin, however intraoperative liver abscess culture significant for sensitive E. coli. and ampicillin/Unasyn/cefazolin resistant Klebsiella pneumoniae.  CT imaging on admission significant for multiple small liver abscesses. General surgery consulted on admission.  Patient started on Zosyn for management.  Surgery recommending interventional radiology for liver drain which was successfully placed on 11/8. Wound culture (11/8) significant for E. Coli intermediate to ampicillin. 19 Fr gallbladder fossa drain with 1 mL output and 10.2 Fr liver abscess drain with 25 mL documented over the last 24 hours -ID recommendations: Discontinue Zosyn IV and start Ceftriaxone IV and Flagyl IV  Leukocytosis Blood cell count of 19,200 on admission.  Secondary to postoperative infection. No associated fevers. Still remains elevated but stable.  Rales Patient appears to be asymptomatic at this time.  History of diastolic heart failure. Chest x-ray with some atelectasis. Rales resolved. -Incentive spirometer  Hypercholesterolemia Patient is on Lipitor as an outpatient which was held on  admission. -Continue Lipitor 40 mg daily  Paroxysmal SVT Patient is managed on Toprol-XL 50 mg daily.  Currently rate controlled.  Started on metoprolol tartrate IV while inpatient. -Continue Toprol-XL 50 mg daily  Chronic diastolic heart failure Patient is managed on Toprol-XL as an outpatient.  Patient is not on ACEi/ARB.  Patient with evidence of possible rales in addition to edema. -Strict in and out and daily weights  Hypoalbuminemia Unsure if patient has malnutrition based off of albumin levels. -Dietitian recommendations (11/9): Ensure Plus High Protein po BID, each supplement provides 350 kcal and 20 grams of protein.    Normocytic anemia Slight drift in setting of recent surgery. Stable.  Thrombocytosis Likely reactive secondary to infection. Slowly improving.   DVT prophylaxis: SCDs Code Status:   Code Status: Full Code Family Communication: None at bedside. Disposition Plan: Discharge home pending general surgery/ID recommendations for outpatient management   Consultants:  General surgery Interventional radiology Infectious disease  Procedures:  None  Antimicrobials: Zosyn IV   Subjective: Patient continues to have loose stools. No watery stools. Otherwise, she is feeling well with improved abdominal pain.  Objective: BP (!) 143/86 (BP Location: Right Arm)   Pulse 80   Temp 97.6 F (36.4 C) (Oral)   Resp 16   Ht '4\' 10"'$  (1.473 m)   Wt 56.7 kg   SpO2 95%   BMI 26.13 kg/m   Examination:  General exam: Appears calm and comfortable Respiratory system: Clear to auscultation. Respiratory effort normal. Cardiovascular system: S1 & S2 heard, Normal rate with regular rhythm. Gastrointestinal system: Abdomen is nondistended, soft and mildly tender. Normal bowel sounds heard. Central nervous system: Alert and oriented. No focal neurological deficits. Musculoskeletal: No calf tenderness Skin: No cyanosis. No rashes Psychiatry: Judgement and insight  appear normal. Mood & affect appropriate.  Data Reviewed: I have personally reviewed following labs and imaging studies  CBC Lab Results  Component Value Date   WBC 15.2 (H) 02/07/2022   RBC 3.98 02/07/2022   HGB 11.8 (L) 02/07/2022   HCT 36.9 02/07/2022   MCV 92.7 02/07/2022   MCH 29.6 02/07/2022   PLT 411 (H) 02/07/2022   MCHC 32.0 02/07/2022   RDW 14.6 02/07/2022   LYMPHSABS 1.5 02/03/2022   MONOABS 1.8 (H) 02/03/2022   EOSABS 0.1 02/03/2022   BASOSABS 0.1 12/87/8676     Last metabolic panel Lab Results  Component Value Date   NA 137 02/07/2022   K 4.2 02/07/2022   CL 107 02/07/2022   CO2 25 02/07/2022   BUN 9 02/07/2022   CREATININE 0.56 02/07/2022   GLUCOSE 123 (H) 02/07/2022   GFRNONAA >60 02/07/2022   GFRAA 78 11/09/2019   CALCIUM 8.2 (L) 02/07/2022   PROT 5.0 (L) 02/04/2022   ALBUMIN 1.9 (L) 02/04/2022   LABGLOB 2.3 01/26/2022   AGRATIO 1.5 01/26/2022   BILITOT 1.1 02/04/2022   ALKPHOS 70 02/04/2022   AST 25 02/04/2022   ALT 26 02/04/2022   ANIONGAP 5 02/07/2022    GFR: Estimated Creatinine Clearance: 37.6 mL/min (by C-G formula based on SCr of 0.56 mg/dL).  Recent Results (from the past 240 hour(s))  Aerobic/Anaerobic Culture w Gram Stain (surgical/deep wound)     Status: None (Preliminary result)   Collection Time: 02/04/22 11:35 AM   Specimen: Liver; Abscess  Result Value Ref Range Status   Specimen Description   Final    LIVER ABSCESS Performed at [facility] [address], [place], [place] [address]    Special Requests   Final    NONE Performed at [facility], [place] [address] [place], [place] [address]    Gram Stain   Final    ABUNDANT WBC PRESENT, PREDOMINANTLY PMN NO ORGANISMS SEEN    Culture   Final    FEW ESCHERICHIA COLI FEW KLEBSIELLA PNEUMONIAE SUSCEPTIBILITIES TO FOLLOW Performed at [facility], [place] [address], [place],  [address]    Report Status PENDING   Incomplete   Organism ID, Bacteria ESCHERICHIA COLI  Final      Susceptibility   Escherichia coli - MIC*    AMPICILLIN 16 INTERMEDIATE Intermediate     CEFAZOLIN <=4 SENSITIVE Sensitive     CEFEPIME <=0.12 SENSITIVE Sensitive     CEFTAZIDIME <=1 SENSITIVE Sensitive     CEFTRIAXONE <=0.25 SENSITIVE Sensitive     CIPROFLOXACIN <=0.25 SENSITIVE Sensitive     GENTAMICIN <=1 SENSITIVE Sensitive     IMIPENEM <=0.25 SENSITIVE Sensitive     TRIMETH/SULFA <=20 SENSITIVE Sensitive     AMPICILLIN/SULBACTAM 4 SENSITIVE Sensitive     PIP/TAZO <=4 SENSITIVE Sensitive     * FEW ESCHERICHIA COLI      Radiology Studies: No results found.    LOS: 4 days    Cordelia Poche, MD [facility] 02/07/2022, 10:17 AM   If 7PM-7AM, please contact night-coverage [email or web address]

## 2022-02-08 ENCOUNTER — Inpatient Hospital Stay (HOSPITAL_COMMUNITY): Payer: Medicare Other

## 2022-02-08 DIAGNOSIS — I5189 Other ill-defined heart diseases: Secondary | ICD-10-CM | POA: Diagnosis not present

## 2022-02-08 DIAGNOSIS — T8140XA Infection following a procedure, unspecified, initial encounter: Secondary | ICD-10-CM | POA: Diagnosis not present

## 2022-02-08 DIAGNOSIS — D649 Anemia, unspecified: Secondary | ICD-10-CM | POA: Diagnosis not present

## 2022-02-08 DIAGNOSIS — I471 Supraventricular tachycardia, unspecified: Secondary | ICD-10-CM | POA: Diagnosis not present

## 2022-02-08 LAB — CBC WITH DIFFERENTIAL/PLATELET
Abs Immature Granulocytes: 0.11 10*3/uL — ABNORMAL HIGH (ref 0.00–0.07)
Basophils Absolute: 0.1 10*3/uL (ref 0.0–0.1)
Basophils Relative: 1 %
Eosinophils Absolute: 0.3 10*3/uL (ref 0.0–0.5)
Eosinophils Relative: 2 %
HCT: 35.7 % — ABNORMAL LOW (ref 36.0–46.0)
Hemoglobin: 11.3 g/dL — ABNORMAL LOW (ref 12.0–15.0)
Immature Granulocytes: 1 %
Lymphocytes Relative: 11 %
Lymphs Abs: 1.6 10*3/uL (ref 0.7–4.0)
MCH: 29 pg (ref 26.0–34.0)
MCHC: 31.7 g/dL (ref 30.0–36.0)
MCV: 91.8 fL (ref 80.0–100.0)
Monocytes Absolute: 1.2 10*3/uL — ABNORMAL HIGH (ref 0.1–1.0)
Monocytes Relative: 8 %
Neutro Abs: 11.8 10*3/uL — ABNORMAL HIGH (ref 1.7–7.7)
Neutrophils Relative %: 77 %
Platelets: 434 10*3/uL — ABNORMAL HIGH (ref 150–400)
RBC: 3.89 MIL/uL (ref 3.87–5.11)
RDW: 14.7 % (ref 11.5–15.5)
WBC: 15.2 10*3/uL — ABNORMAL HIGH (ref 4.0–10.5)
nRBC: 0 % (ref 0.0–0.2)

## 2022-02-08 LAB — COMPREHENSIVE METABOLIC PANEL
ALT: 31 U/L (ref 0–44)
AST: 51 U/L — ABNORMAL HIGH (ref 15–41)
Albumin: 2.4 g/dL — ABNORMAL LOW (ref 3.5–5.0)
Alkaline Phosphatase: 85 U/L (ref 38–126)
Anion gap: 8 (ref 5–15)
BUN: 8 mg/dL (ref 8–23)
CO2: 22 mmol/L (ref 22–32)
Calcium: 8.3 mg/dL — ABNORMAL LOW (ref 8.9–10.3)
Chloride: 106 mmol/L (ref 98–111)
Creatinine, Ser: 0.55 mg/dL (ref 0.44–1.00)
GFR, Estimated: >60 mL/min (ref >60)
Glucose, Bld: 76 mg/dL (ref 70–99)
Potassium: 4.4 mmol/L (ref 3.5–5.1)
Sodium: 136 mmol/L (ref 135–145)
Total Bilirubin: 0.4 mg/dL (ref 0.3–1.2)
Total Protein: 5.8 g/dL — ABNORMAL LOW (ref 6.5–8.1)

## 2022-02-08 MED ORDER — ENOXAPARIN SODIUM 40 MG/0.4ML IJ SOSY
40.0000 mg | PREFILLED_SYRINGE | INTRAMUSCULAR | Status: DC
  Administered 2022-02-08 – 2022-02-09 (×2): 40 mg via SUBCUTANEOUS
  Filled 2022-02-08 (×3): qty 0.4

## 2022-02-08 MED ORDER — IOHEXOL 9 MG/ML PO SOLN
ORAL | Status: AC
  Administered 2022-02-08: 1000 mL via ORAL
  Filled 2022-02-08: qty 500

## 2022-02-08 MED ORDER — IOHEXOL 300 MG/ML  SOLN
100.0000 mL | Freq: Once | INTRAMUSCULAR | Status: AC | PRN
  Administered 2022-02-08: 100 mL via INTRAVENOUS

## 2022-02-08 MED ORDER — LOPERAMIDE HCL 2 MG PO CAPS
2.0000 mg | ORAL_CAPSULE | ORAL | Status: DC | PRN
  Administered 2022-02-09: 2 mg via ORAL
  Filled 2022-02-08: qty 1

## 2022-02-08 NOTE — Progress Notes (Signed)
PROGRESS NOTE    Alexis Hines  SJG:[id number] DOB: 09/11/1933 DOA: 02/03/2022 PCP: Rita Ohara, MD   Brief Narrative: Alexis Hines is a 88 y.o. female with a history of osteoarthritis, basal cell cancer of the nose, diverticulosis, migraine headaches, paroxysmal SVT, grade 1 diastolic dysfunction who presented secondary to decreased JP drain output and worsening abdominal pain.  She was found to have evidence of postoperative infection which appears to be secondary to inadequate coverage of bacterial infection.  Culture results.  General surgery consulted on admission.  Patient started on Zosyn IV for coverage.  Interventional radiology consulted for liver abscess drain placement. Leukocytosis stable. Plan for repeat CT abdomen/pelvis.   Assessment and Plan:  Postoperative infection In setting of recent laparoscopic cholecystectomy.  Patient was discharged on Augmentin, however intraoperative liver abscess culture significant for sensitive E. coli. and ampicillin/Unasyn/cefazolin resistant Klebsiella pneumoniae.  CT imaging on admission significant for multiple small liver abscesses. General surgery consulted on admission.  Patient started on Zosyn for management.  Surgery recommending interventional radiology for liver drain which was successfully placed on 11/8. Wound culture (11/8) significant for E. Coli and Klebsiella pneumoniae as noted in prior wound culture. Minimal drainage documented overnight. -ID recommendations: Ceftriaxone IV and Flagyl IV  Leukocytosis Blood cell count of 19,200 on admission.  Secondary to postoperative infection. No associated fevers. Down to around 15,000 and stable. Afebrile.  Rales Patient appears to be asymptomatic at this time.  History of diastolic heart failure. Chest x-ray with some atelectasis. Rales resolved. -Incentive spirometer  Hypercholesterolemia Patient is on Lipitor as an outpatient which was held on admission. -Continue Lipitor  40 mg daily  Paroxysmal SVT Patient is managed on Toprol-XL 50 mg daily.  Currently rate controlled.  Started on metoprolol tartrate IV while inpatient. -Continue Toprol-XL 50 mg daily  Chronic diastolic heart failure Patient is managed on Toprol-XL as an outpatient.  Patient is not on ACEi/ARB.  Patient with evidence of possible rales in addition to edema. -Strict in and out and daily weights  Hypoalbuminemia Unsure if patient has malnutrition based off of albumin levels. -Dietitian recommendations (11/9): Ensure Plus High Protein po BID, each supplement provides 350 kcal and 20 grams of protein.    Normocytic anemia Slight drift in setting of recent surgery. Stable.  Thrombocytosis Likely reactive secondary to infection. Slowly improving.   DVT prophylaxis: SCDs Code Status:   Code Status: Full Code Family Communication: None at bedside. Disposition Plan: Discharge home pending general surgery/ID recommendations for outpatient management   Consultants:  General surgery Interventional radiology Infectious disease  Procedures:  None  Antimicrobials: Zosyn IV   Subjective: Abdominal pain worsened overnight. Improved this morning but still present.  Objective: BP (!) 143/71 (BP Location: Left Arm)   Pulse 76   Temp 97.8 F (36.6 C) (Oral)   Resp 20   Ht '4\' 10"'$  (1.473 m)   Wt 56.7 kg   SpO2 96%   BMI 26.13 kg/m   Examination:  General exam: Appears calm and comfortable Respiratory system: Clear to auscultation. Respiratory effort normal. Cardiovascular system: S1 & S2 heard, RRR. No murmurs, rubs, gallops or clicks. Gastrointestinal system: Abdomen is slightly distended, soft and mildly tender. Normal bowel sounds heard. Central nervous system: Alert and oriented. No focal neurological deficits. Musculoskeletal: No calf tenderness Skin: No cyanosis. No rashes Psychiatry: Judgement and insight appear normal. Mood & affect appropriate.    Data Reviewed: I  have personally reviewed following labs and imaging studies  CBC  Lab Results  Component Value Date   WBC 15.2 (H) 02/08/2022   RBC 3.89 02/08/2022   HGB 11.3 (L) 02/08/2022   HCT 35.7 (L) 02/08/2022   MCV 91.8 02/08/2022   MCH 29.0 02/08/2022   PLT 434 (H) 02/08/2022   MCHC 31.7 02/08/2022   RDW 14.7 02/08/2022   LYMPHSABS 1.6 02/08/2022   MONOABS 1.2 (H) 02/08/2022   EOSABS 0.3 02/08/2022   BASOSABS 0.1 28/41/3244     Last metabolic panel Lab Results  Component Value Date   NA 137 02/07/2022   K 4.2 02/07/2022   CL 107 02/07/2022   CO2 25 02/07/2022   BUN 9 02/07/2022   CREATININE 0.56 02/07/2022   GLUCOSE 123 (H) 02/07/2022   GFRNONAA >60 02/07/2022   GFRAA 78 11/09/2019   CALCIUM 8.2 (L) 02/07/2022   PROT 5.0 (L) 02/04/2022   ALBUMIN 1.9 (L) 02/04/2022   LABGLOB 2.3 01/26/2022   AGRATIO 1.5 01/26/2022   BILITOT 1.1 02/04/2022   ALKPHOS 70 02/04/2022   AST 25 02/04/2022   ALT 26 02/04/2022   ANIONGAP 5 02/07/2022    GFR: Estimated Creatinine Clearance: 37.6 mL/min (by C-G formula based on SCr of 0.56 mg/dL).  Recent Results (from the past 240 hour(s))  Aerobic/Anaerobic Culture w Gram Stain (surgical/deep wound)     Status: None (Preliminary result)   Collection Time: 02/04/22 11:35 AM   Specimen: Liver; Abscess  Result Value Ref Range Status   Specimen Description   Final    LIVER ABSCESS Performed at [facility] [address], [place], [place] [address]    Special Requests   Final    NONE Performed at [facility], [place] [address], [place], [place] [address]    Gram Stain   Final    ABUNDANT WBC PRESENT, PREDOMINANTLY PMN NO ORGANISMS SEEN Performed at [facility], [place] [address], [place], [place] [address]    Culture   Final    FEW ESCHERICHIA COLI FEW KLEBSIELLA PNEUMONIAE NO ANAEROBES ISOLATED; CULTURE IN PROGRESS FOR 5 DAYS    Report Status PENDING  Incomplete   Organism ID, Bacteria  ESCHERICHIA COLI  Final   Organism ID, Bacteria KLEBSIELLA PNEUMONIAE  Final      Susceptibility   Escherichia coli - MIC*    AMPICILLIN 16 INTERMEDIATE Intermediate     CEFAZOLIN <=4 SENSITIVE Sensitive     CEFEPIME <=0.12 SENSITIVE Sensitive     CEFTAZIDIME <=1 SENSITIVE Sensitive     CEFTRIAXONE <=0.25 SENSITIVE Sensitive     CIPROFLOXACIN <=0.25 SENSITIVE Sensitive     GENTAMICIN <=1 SENSITIVE Sensitive     IMIPENEM <=0.25 SENSITIVE Sensitive     TRIMETH/SULFA <=20 SENSITIVE Sensitive     AMPICILLIN/SULBACTAM 4 SENSITIVE Sensitive     PIP/TAZO <=4 SENSITIVE Sensitive     * FEW ESCHERICHIA COLI   Klebsiella pneumoniae - MIC*    AMPICILLIN >=32 RESISTANT Resistant     CEFAZOLIN >=64 RESISTANT Resistant     CEFEPIME <=0.12 SENSITIVE Sensitive     CEFTAZIDIME <=1 SENSITIVE Sensitive     CEFTRIAXONE <=0.25 SENSITIVE Sensitive     CIPROFLOXACIN <=0.25 SENSITIVE Sensitive     GENTAMICIN <=1 SENSITIVE Sensitive     IMIPENEM 0.5 SENSITIVE Sensitive     TRIMETH/SULFA <=20 SENSITIVE Sensitive     AMPICILLIN/SULBACTAM 16 INTERMEDIATE Intermediate     PIP/TAZO <=4 SENSITIVE Sensitive     * FEW KLEBSIELLA PNEUMONIAE      Radiology Studies: No results found.  LOS: 5 days    Cordelia Poche, MD [facility] 02/08/2022, 10:49 AM   If 7PM-7AM, please contact night-coverage [email or web address]

## 2022-02-08 NOTE — Progress Notes (Signed)
Subjective/Chief Complaint: Patient complains of intermittent pain overnight.  Has concerns about her bowel issues when she goes home.  The pain is located in right upper quadrant and she required Dilaudid last night for.  It is now gone.  No nausea or vomiting.  She is tolerating her diet.  Her white count is 15,000 today which is what it was yesterday.  Drainage from her drain is recorded and is quite minimal.   Objective: Vital signs in last 24 hours: Temp:  [97.5 F (36.4 C)-98.2 F (36.8 C)] 98.2 F (36.8 C) (11/12 0455) Pulse Rate:  [77-90] 77 (11/12 0455) Resp:  [18-20] 18 (11/12 0455) BP: (150-155)/(71-82) 155/76 (11/12 0455) SpO2:  [96 %-99 %] 96 % (11/12 0455) Last BM Date : 02/07/22  Intake/Output from previous day: 11/11 0701 - 11/12 0700 In: 245 [P.O.:240; I.V.:5] Out: 0  Intake/Output this shift: No intake/output data recorded.   General: pleasant, WD, female who is laying in bed in NAD Lungs: Respiratory effort nonlabored on room air Abd: soft, +BS. No distension. mild TTP over incisions and RUQ. No rebound or guarding. Incisions with c/d/I with surgical glue and mild ecchymosis. JP drain with serosanguineous but this appears a little bit bile tinged. IR drain with scant SS output Lab Results:  Recent Labs    02/07/22 0851 02/08/22 0619  WBC 15.2* 15.2*  HGB 11.8* 11.3*  HCT 36.9 35.7*  PLT 411* 434*   BMET Recent Labs    02/06/22 0500 02/07/22 0851  NA 138 137  K 3.3* 4.2  CL 104 107  CO2 28 25  GLUCOSE 105* 123*  BUN 10 9  CREATININE 0.63 0.56  CALCIUM 7.7* 8.2*   PT/INR No results for input(s): "LABPROT", "INR" in the last 72 hours. ABG No results for input(s): "PHART", "HCO3" in the last 72 hours.  Invalid input(s): "PCO2", "PO2"  Studies/Results: No results found.  Anti-infectives: Anti-infectives (From admission, onward)    Start     Dose/Rate Route Frequency Ordered Stop   02/06/22 1045  cefTRIAXone (ROCEPHIN) 2 g in sodium  chloride 0.9 % 100 mL IVPB        2 g 200 mL/hr over 30 Minutes Intravenous Daily 02/06/22 0945     02/06/22 1045  metroNIDAZOLE (FLAGYL) tablet 500 mg        500 mg Oral Every 12 hours 02/06/22 0945     02/03/22 1800  piperacillin-tazobactam (ZOSYN) IVPB 3.375 g  Status:  Discontinued        3.375 g 12.5 mL/hr over 240 Minutes Intravenous Every 8 hours 02/03/22 1519 02/06/22 0945   02/03/22 1515  piperacillin-tazobactam (ZOSYN) IVPB 3.375 g  Status:  Discontinued        3.375 g 100 mL/hr over 30 Minutes Intravenous Every 8 hours 02/03/22 1513 02/03/22 1519   02/03/22 1130  piperacillin-tazobactam (ZOSYN) IVPB 3.375 g        3.375 g 100 mL/hr over 30 Minutes Intravenous  Once 02/03/22 1127 02/03/22 1231       Assessment/Plan:    Abdominal pain, leukocytosis POD 12 status post laparoscopic cholecystectomy with JP drain placement 10/31 Dr. Thermon Leyland for gangrenous cholecystitis with intra-op finding of liver abscess -WBC 15,000  afebrile. ID following and rocephin/flagyl - intra-op culture of liver abscess 10/31 with e coli and klebsiella with resistance to ampicillin. - CT 11/7 with additional fluid collections noted - s/p IR drain 11/8 repeat CT scan today for follow-up since she is having more pain and the drainage  appears minimally bile tinged today. - IR drain , gram neg rods so far. Culture with e. coli - JP drain  continue -Required Dilaudid overnight  - having loose Bms - metamucil per [facility]   FEN: regular, ensure ID: [email or web address]>rocephin/flagyl VTE: okay for chemic prophylaxis from surgical standpoint Dispo: po intake poor  Encourage po intake   If CT is improved white count is down, she may be discharged earlier this week.  She has lots of social anxiety about going home since she lives by herself especially with her bowel habits.  Hopefully, if her skin is normal she can leave the hospital due to drains earlier this week but given the change in color I think it is prudent  to follow-up with a CT scan to make sure there is no other issues especially with her having pain last night.  Joyice Faster Barbara Ahart MD 02/08/2022

## 2022-02-09 LAB — CBC WITH DIFFERENTIAL/PLATELET
Abs Immature Granulocytes: 0.16 10*3/uL — ABNORMAL HIGH (ref 0.00–0.07)
Basophils Absolute: 0.1 10*3/uL (ref 0.0–0.1)
Basophils Relative: 1 %
Eosinophils Absolute: 0.4 10*3/uL (ref 0.0–0.5)
Eosinophils Relative: 2 %
HCT: 38.3 % (ref 36.0–46.0)
Hemoglobin: 12 g/dL (ref 12.0–15.0)
Immature Granulocytes: 1 %
Lymphocytes Relative: 13 %
Lymphs Abs: 2 10*3/uL (ref 0.7–4.0)
MCH: 29.3 pg (ref 26.0–34.0)
MCHC: 31.3 g/dL (ref 30.0–36.0)
MCV: 93.6 fL (ref 80.0–100.0)
Monocytes Absolute: 1.4 10*3/uL — ABNORMAL HIGH (ref 0.1–1.0)
Monocytes Relative: 9 %
Neutro Abs: 11.5 10*3/uL — ABNORMAL HIGH (ref 1.7–7.7)
Neutrophils Relative %: 74 %
Platelets: 502 10*3/uL — ABNORMAL HIGH (ref 150–400)
RBC: 4.09 MIL/uL (ref 3.87–5.11)
RDW: 14.8 % (ref 11.5–15.5)
WBC: 15.5 10*3/uL — ABNORMAL HIGH (ref 4.0–10.5)
nRBC: 0 % (ref 0.0–0.2)

## 2022-02-09 LAB — AEROBIC/ANAEROBIC CULTURE W GRAM STAIN (SURGICAL/DEEP WOUND)

## 2022-02-09 MED ORDER — SACCHAROMYCES BOULARDII 250 MG PO CAPS
250.0000 mg | ORAL_CAPSULE | Freq: Two times a day (BID) | ORAL | Status: DC
  Administered 2022-02-09 – 2022-02-11 (×5): 250 mg via ORAL
  Filled 2022-02-09 (×5): qty 1

## 2022-02-09 NOTE — Progress Notes (Addendum)
Progress Note     Subjective: Pt reports 3 BMs yesterday and none overnight. Reports some RUQ pain. Tolerating diet and denies n/v. Seems anxious about discharge home with drain. Son at bedside this AM.   Objective: Vital signs in last 24 hours: Temp:  [97.8 F (36.6 C)-98.4 F (36.9 C)] 97.8 F (36.6 C) (11/13 0628) Pulse Rate:  [81-97] 97 (11/13 0628) Resp:  [17-20] 17 (11/13 0628) BP: (142-150)/(62-118) 150/72 (11/13 0628) SpO2:  [96 %-99 %] 96 % (11/13 0628) Last BM Date : 02/07/22  Intake/Output from previous day: 11/12 0701 - 11/13 0700 In: 175 [P.O.:170; I.V.:5] Out: 15 [Drains:15] Intake/Output this shift: No intake/output data recorded.  PE: General: pleasant, WD, elderly female who is laying in bed in NAD HEENT: sclera anicteric  Heart: regular, rate, and rhythm.   Lungs: Respiratory effort nonlabored Abd: soft, NT, ND, surgical drain with scant fluid (removed without complication), IR drain with small amount fluid, incisions C/D/I Psych: A&Ox3 with an anxious affect.    Lab Results:  Recent Labs    02/08/22 0619 02/09/22 0606  WBC 15.2* 15.5*  HGB 11.3* 12.0  HCT 35.7* 38.3  PLT 434* 502*   BMET Recent Labs    02/07/22 0851 02/08/22 0619  NA 137 136  K 4.2 4.4  CL 107 106  CO2 25 22  GLUCOSE 123* 76  BUN 9 8  CREATININE 0.56 0.55  CALCIUM 8.2* 8.3*   PT/INR No results for input(s): "LABPROT", "INR" in the last 72 hours. CMP     Component Value Date/Time   NA 136 02/08/2022 0619   NA 135 01/26/2022 1202   K 4.4 02/08/2022 0619   CL 106 02/08/2022 0619   CO2 22 02/08/2022 0619   GLUCOSE 76 02/08/2022 0619   BUN 8 02/08/2022 0619   BUN 11 01/26/2022 1202   CREATININE 0.55 02/08/2022 0619   CALCIUM 8.3 (L) 02/08/2022 0619   PROT 5.8 (L) 02/08/2022 0619   PROT 5.8 (L) 01/26/2022 1202   ALBUMIN 2.4 (L) 02/08/2022 0619   ALBUMIN 3.5 (L) 01/26/2022 1202   AST 51 (H) 02/08/2022 0619   ALT 31 02/08/2022 0619   ALKPHOS 85 02/08/2022  0619   BILITOT 0.4 02/08/2022 0619   BILITOT 0.9 01/26/2022 1202   GFRNONAA >60 02/08/2022 0619   GFRAA 78 11/09/2019 0947   Lipase     Component Value Date/Time   LIPASE 25 02/03/2022 1102       Studies/Results: CT ABDOMEN PELVIS W CONTRAST  Result Date: 02/08/2022 CLINICAL DATA:  Cholecystectomy 13 days ago with postoperative fluid collections EXAM: CT ABDOMEN AND PELVIS WITH CONTRAST TECHNIQUE: Multidetector CT imaging of the abdomen and pelvis was performed using the standard protocol following bolus administration of intravenous contrast. RADIATION DOSE REDUCTION: This exam was performed according to the departmental dose-optimization program which includes automated exposure control, adjustment of the mA and/or kV according to patient size and/or use of iterative reconstruction technique. CONTRAST:  110m OMNIPAQUE IOHEXOL 300 MG/ML  SOLN COMPARISON:  02/03/2022 and the CT-guided drain placement of 02/04/2022. FINDINGS: Lower chest: Clustered nodularity within the inferior right upper lobe, likely postinfectious or inflammatory. Scarring in the right middle lobe. Small right pleural effusion is similar. Mild cardiomegaly. Hepatobiliary: No focal liver lesion. Cholecystectomy with mild intrahepatic biliary duct dilatation. The common duct measures maximally 1.0 cm in the porta hepatis, similar. Subtle hyperattenuation in the dependent common duct including on 35/4 is likely present on the prior. Surgical drain terminates adjacent the  left lobe of the liver, without surrounding fluid. Right sided percutaneous drain with ill-defined surrounding collection of 2.0 x 1.9 cm on 38/4 versus 2.7 x 1.8 cm on the prior diagnostic CT. The collection about the inferior right hepatic lobe measures 4.9 x 1.6 cm on 45/4 versus 5.7 x 2.0 cm on the prior exam. Smaller right perihepatic collection of 1.3 cm on 37/4 is similar 1.1 cm on the prior. Anterior right perihepatic collection measures 4.1 x 1.1 cm on  43/4 versus 5.0 x 1.2 cm previously. Pancreas: Normal pancreas for age. No duct dilatation or acute inflammation. Spleen: Normal in size, without focal abnormality. Adrenals/Urinary Tract: Normal adrenal glands. Bilateral too small to characterize renal lesions are most likely cysts . In the absence of clinically indicated signs/symptoms require(s) no independent follow-up. No hydronephrosis. Degraded evaluation of the pelvis, secondary to beam hardening artifact from right hip arthroplasty. Grossly normal urinary bladder. Stomach/Bowel: Proximal gastric underdistention. Normal stomach, without wall thickening. Resolved right-sided colonic wall thickening. Normal terminal ileum and appendix. Normal small bowel. Vascular/Lymphatic: Advanced aortic and branch vessel atherosclerosis. No abdominopelvic adenopathy. Reproductive: Normal uterus. Prominent gonadal veins can be seen with pelvic congestion syndrome. No adnexal mass. Other: No significant free fluid. Musculoskeletal: Right hip arthroplasty. Convex left lumbar spine curvature. IMPRESSION: 1. Placement of right upper quadrant percutaneous drain. Mild decrease in size of the majority of the perihepatic collections as detailed above. A right perihepatic 1.3 cm collection is similar to on the prior exam. 2. Mild intrahepatic biliary duct dilatation. Possible dependent common duct stones or sludge. If bilirubin is elevated, consider MRCP. 3. Small right pleural effusion 4. Resolved right-sided colitis 5.  Aortic Atherosclerosis (ICD10-I70.0). 6. Degraded evaluation of the pelvis, secondary to beam hardening artifact from right hip arthroplasty. Electronically Signed   By: Abigail Miyamoto M.D.   On: 02/08/2022 14:20    Anti-infectives: Anti-infectives (From admission, onward)    Start     Dose/Rate Route Frequency Ordered Stop   02/06/22 1045  cefTRIAXone (ROCEPHIN) 2 g in sodium chloride 0.9 % 100 mL IVPB        2 g 200 mL/hr over 30 Minutes Intravenous Daily  02/06/22 0945     02/06/22 1045  metroNIDAZOLE (FLAGYL) tablet 500 mg        500 mg Oral Every 12 hours 02/06/22 0945     02/03/22 1800  piperacillin-tazobactam (ZOSYN) IVPB 3.375 g  Status:  Discontinued        3.375 g 12.5 mL/hr over 240 Minutes Intravenous Every 8 hours 02/03/22 1519 02/06/22 0945   02/03/22 1515  piperacillin-tazobactam (ZOSYN) IVPB 3.375 g  Status:  Discontinued        3.375 g 100 mL/hr over 30 Minutes Intravenous Every 8 hours 02/03/22 1513 02/03/22 1519   02/03/22 1130  piperacillin-tazobactam (ZOSYN) IVPB 3.375 g        3.375 g 100 mL/hr over 30 Minutes Intravenous  Once 02/03/22 1127 02/03/22 1231        Assessment/Plan  Abdominal pain, leukocytosis POD 13 status post laparoscopic cholecystectomy with JP drain placement 10/31 Dr. Thermon Leyland for gangrenous cholecystitis with intra-op finding of liver abscess - WBC stable at 15, afebrile. ID following and rocephin/flagyl - intra-op culture of liver abscess 10/31 with e coli and klebsiella with resistance to ampicillin. Cx from IR drain placement 11/8 with the same  - CT 11/7 with additional fluid collections noted - s/p IR drain 11/8  - CT yesterday with resolution in fluid around surgical drain,  slight improvement in other collections - IR to eval repeat scan and make further recommendations regarding percutaneous drain  - surgical drain with minimal output and CT with no surrounding fluid - removed at bedside without complication - Son at bedside   Diarrhea - prn imodium, metamucil per TRH. Added probiotic today per family request    FEN: regular, ensure ID: [email or web address]. rocephin/flagyl 11/10>> VTE: LMWH Dispo: IR to evaluate repeat CT, surgical drain removed.     LOS: 6 days    Norm Parcel, [facility] Surgery 02/09/2022, 9:33 AM Please see Amion for pager number during day hours 7:00am-4:30pm    I personally saw the patient and performed a substantive portion of this encounter,  including a complete performance of at least one of the key components (MDM, Hx and/or Exam), in conjunction with the Advanced Practice Provider Barkley Boards, PA-C.

## 2022-02-09 NOTE — Progress Notes (Signed)
Mobility Specialist - Progress Note   02/09/22 1041  Mobility  Activity Ambulated with assistance in hallway  Level of Assistance Standby assist, set-up cues, supervision of patient - no hands on  Assistive Device Front wheel walker  Distance Ambulated (ft) 200 ft  Activity Response Tolerated well  Mobility Referral Yes  $Mobility charge 1 Mobility   Pt received in bed and agreed to mobility, no c/o pain nor discomfort during session. Pt returned to chair with all needs met.   Roderick Pee Mobility Specialist

## 2022-02-09 NOTE — Progress Notes (Signed)
Referring Physician(s): Connor,C  Supervising Physician: Jacqulynn Cadet  Patient Status:  [facility] - In-pt  Chief Complaint: Hepatic abscess post acute gangrenous cholecystitis/cholecystectomy, abdominal pain    Subjective: Pt doing ok; c/o some back pain; abd pain has decreased some; occ nausea; not happy about plans for lovenox injection   Allergies: Patient has no known allergies.  Medications: Prior to Admission medications   Medication Sig Start Date End Date Taking? Authorizing Provider  acetaminophen (TYLENOL) 500 MG tablet Take 2 tablets (1,000 mg total) by mouth every 6 (six) hours as needed. Patient taking differently: Take 1,000 mg by mouth every 6 (six) hours as needed for moderate pain. 01/29/22  Yes Saverio Danker, PA-C  ADVIL 200 MG CAPS Take 200 mg by mouth every 6 (six) hours as needed (for headaches).   Yes [provider]  atorvastatin (LIPITOR) 40 MG tablet TAKE 1 TABLET BY MOUTH AT  BEDTIME Patient taking differently: Take 40 mg by mouth at bedtime. 11/27/21  Yes Rita Ohara, MD  Biotin 10000 MCG TABS Take 10,000 mcg by mouth in the morning.   Yes [provider]  Calcium Carbonate-Vitamin D (CALTRATE 600+D PO) Take 1 tablet by mouth daily.   Yes [provider]  ipratropium (ATROVENT) 0.06 % nasal spray USE 2 SPRAYS IN EACH NOSTRIL 3 TO 4 TIMES DAILY FOR RUNNY NOSE Patient taking differently: Place 2 sprays into both nostrils in the morning and at bedtime. 09/15/21  Yes Rita Ohara, MD  metoprolol succinate (TOPROL-XL) 50 MG 24 hr tablet Take 1 tablet (50 mg total) by mouth daily. Take with or immediately following a meal. Patient taking differently: Take 50 mg by mouth daily. 12/24/21  Yes Rita Ohara, MD  Multiple Vitamin (MULTIVITAMIN WITH MINERALS) TABS tablet Take 1 tablet by mouth daily with breakfast. CENTRUM MULTIVITAMIN   Yes [provider]  omeprazole (PRILOSEC) 20 MG capsule Take 20 mg by mouth daily before breakfast.    Yes [provider]  PREVIDENT 5000 BOOSTER PLUS 1.1 % PSTE Place 1 application  onto teeth every 3 (three) days. 10/30/21  Yes [provider]  Probiotic Product (PROBIOTIC PO) Take 1 capsule by mouth daily.   Yes [provider]  Wheat Dextrin (BENEFIBER PO) Take 8-10 g by mouth See admin instructions. Mix 8-10 grams (2 teaspoonsful) of powder into applesauce or the suggested amount of warm water and consume 2 times a day   Yes [provider]  amoxicillin (AMOXIL) 500 MG capsule Take 2,000 mg by mouth See admin instructions. Take 2,000 mg by mouth one hour prior to dental appointments Patient not taking: Reported on 02/03/2022 12/02/21   [provider]  oxyCODONE (OXY IR/ROXICODONE) 5 MG immediate release tablet Take 1 tablet (5 mg total) by mouth every 4 (four) hours as needed for moderate pain. Patient not taking: Reported on 02/03/2022 01/29/22   Saverio Danker, PA-C     Vital Signs: BP (!) 151/75 (BP Location: Left Arm)   Pulse 92   Temp 98 F (36.7 C) (Oral)   Resp 18   Ht '4\' 10"'$  (1.473 m)   Wt 125 lb (56.7 kg)   SpO2 97%   BMI 26.13 kg/m   Physical Exam awake/alert; affect a little more depressed today; RUQ/hepatic drain intact, insertion site mildly tender, about 10 cc turbid reddish beige fluid in JP; drain flushed without diffculty; surgical drain removed  Imaging: CT ABDOMEN PELVIS W CONTRAST  Result Date: 02/08/2022 CLINICAL DATA:  Cholecystectomy 13 days  ago with postoperative fluid collections EXAM: CT ABDOMEN AND PELVIS WITH CONTRAST TECHNIQUE: Multidetector CT imaging of the abdomen and pelvis was performed using the standard protocol following bolus administration of intravenous contrast. RADIATION DOSE REDUCTION: This exam was performed according to the departmental dose-optimization program which includes automated exposure control, adjustment of the mA and/or kV according to patient size and/or use of iterative reconstruction  technique. CONTRAST:  173m OMNIPAQUE IOHEXOL 300 MG/ML  SOLN COMPARISON:  02/03/2022 and the CT-guided drain placement of 02/04/2022. FINDINGS: Lower chest: Clustered nodularity within the inferior right upper lobe, likely postinfectious or inflammatory. Scarring in the right middle lobe. Small right pleural effusion is similar. Mild cardiomegaly. Hepatobiliary: No focal liver lesion. Cholecystectomy with mild intrahepatic biliary duct dilatation. The common duct measures maximally 1.0 cm in the porta hepatis, similar. Subtle hyperattenuation in the dependent common duct including on 35/4 is likely present on the prior. Surgical drain terminates adjacent the left lobe of the liver, without surrounding fluid. Right sided percutaneous drain with ill-defined surrounding collection of 2.0 x 1.9 cm on 38/4 versus 2.7 x 1.8 cm on the prior diagnostic CT. The collection about the inferior right hepatic lobe measures 4.9 x 1.6 cm on 45/4 versus 5.7 x 2.0 cm on the prior exam. Smaller right perihepatic collection of 1.3 cm on 37/4 is similar 1.1 cm on the prior. Anterior right perihepatic collection measures 4.1 x 1.1 cm on 43/4 versus 5.0 x 1.2 cm previously. Pancreas: Normal pancreas for age. No duct dilatation or acute inflammation. Spleen: Normal in size, without focal abnormality. Adrenals/Urinary Tract: Normal adrenal glands. Bilateral too small to characterize renal lesions are most likely cysts . In the absence of clinically indicated signs/symptoms require(s) no independent follow-up. No hydronephrosis. Degraded evaluation of the pelvis, secondary to beam hardening artifact from right hip arthroplasty. Grossly normal urinary bladder. Stomach/Bowel: Proximal gastric underdistention. Normal stomach, without wall thickening. Resolved right-sided colonic wall thickening. Normal terminal ileum and appendix. Normal small bowel. Vascular/Lymphatic: Advanced aortic and branch vessel atherosclerosis. No abdominopelvic  adenopathy. Reproductive: Normal uterus. Prominent gonadal veins can be seen with pelvic congestion syndrome. No adnexal mass. Other: No significant free fluid. Musculoskeletal: Right hip arthroplasty. Convex left lumbar spine curvature. IMPRESSION: 1. Placement of right upper quadrant percutaneous drain. Mild decrease in size of the majority of the perihepatic collections as detailed above. A right perihepatic 1.3 cm collection is similar to on the prior exam. 2. Mild intrahepatic biliary duct dilatation. Possible dependent common duct stones or sludge. If bilirubin is elevated, consider MRCP. 3. Small right pleural effusion 4. Resolved right-sided colitis 5.  Aortic Atherosclerosis (ICD10-I70.0). 6. Degraded evaluation of the pelvis, secondary to beam hardening artifact from right hip arthroplasty. Electronically Signed   By: KAbigail MiyamotoM.D.   On: 02/08/2022 14:20    Labs:  CBC: Recent Labs    02/06/22 0500 02/07/22 0851 02/08/22 0619 02/09/22 0606  WBC 14.9* 15.2* 15.2* 15.5*  HGB 10.5* 11.8* 11.3* 12.0  HCT 32.4* 36.9 35.7* 38.3  PLT 400 411* 434* 502*    COAGS: Recent Labs    02/04/22 0934  INR 1.2    BMP: Recent Labs    02/04/22 0649 02/06/22 0500 02/07/22 0851 02/08/22 0619  NA 139 138 137 136  K 3.5 3.3* 4.2 4.4  CL 104 104 107 106  CO2 '25 28 25 22  '$ GLUCOSE 69* 105* 123* 76  BUN 7* '10 9 8  '$ CALCIUM 7.9* 7.7* 8.2* 8.3*  CREATININE 0.54 0.63 0.56 0.55  GFRNONAA >60 >60 >60 >60    LIVER FUNCTION TESTS: Recent Labs    01/28/22 0428 02/03/22 1102 02/04/22 0649 02/08/22 0619  BILITOT 0.6 0.8 1.1 0.4  AST 62* 31 25 51*  ALT 50* 34 26 31  ALKPHOS 90 89 70 85  PROT 5.4* 5.8* 5.0* 5.8*  ALBUMIN 2.3* 2.3* 1.9* 2.4*    Assessment and Plan: Pt s/p laparoscopic cholecystectomy with JP drain placement on 01/27/22 for acute gangrenous cholecystitis; now s/p hepatic abscess drain placement 11/8 (10 fr to JP); afebrile ; liver absc cx- e coli/klebsiella ; WBC  15.5(15.2), hgb stable; CT A/P yesterday:   1. Placement of right upper quadrant percutaneous drain. Mild decrease in size of the majority of the perihepatic collections as detailed above. A right perihepatic 1.3 cm collection is similar to on the prior exam. 2. Mild intrahepatic biliary duct dilatation. Possible dependent common duct stones or sludge. If bilirubin is elevated, consider MRCP. 3. Small right pleural effusion 4. Resolved right-sided colitis 5.  Aortic Atherosclerosis (ICD10-I70.0). 6. Degraded evaluation of the pelvis, secondary to beam hardening artifact from right hip arthroplasty.  Images were reviewed by Dr. Laurence Ferrari; no change in plans recommended at this time; continue current drain; cont drain irrigation; once outpatient then flush drain once daily with 5 cc sterile saline, record output and change dressing every 2-3 days; we will arrange for f/u CT at our IR clinic after dc date finalized; pt given prescription for saline flushes   Electronically Signed: D. Rowe Robert, PA-C 02/09/2022, 1:35 PM   I spent a total of 15 Minutes at the the patient's bedside AND on the patient's hospital floor or unit, greater than 50% of which was counseling/coordinating care for hepatic abscess drain    Patient ID: Alexis Hines, female   DOB: 1934/01/22, 88 y.o.   MRN: [id number]

## 2022-02-09 NOTE — Progress Notes (Signed)
PROGRESS NOTE    Alexis Hines  BPZ:[id number] DOB: December 08, 1933 DOA: 02/03/2022 PCP: Rita Ohara, MD   Brief Narrative: Alexis Hines is a 88 y.o. female with a history of osteoarthritis, basal cell cancer of the nose, diverticulosis, migraine headaches, paroxysmal SVT, grade 1 diastolic dysfunction who presented secondary to decreased JP drain output and worsening abdominal pain.  She was found to have evidence of postoperative infection which appears to be secondary to inadequate coverage of bacterial infection.  Culture results.  General surgery consulted on admission.  Patient started on Zosyn IV for coverage.  Interventional radiology consulted for liver abscess drain placement. Leukocytosis stable. Plan for repeat CT abdomen/pelvis.   Assessment and Plan:  Postoperative infection In setting of recent laparoscopic cholecystectomy.  Patient was discharged on Augmentin, however intraoperative liver abscess culture significant for sensitive E. coli. and ampicillin/Unasyn/cefazolin resistant Klebsiella pneumoniae.  CT imaging on admission significant for multiple small liver abscesses. General surgery consulted on admission.  Patient started on Zosyn for management.  Surgery recommending interventional radiology for liver drain which was successfully placed on 11/8. Wound culture (11/8) significant for E. Coli and Klebsiella pneumoniae as noted in prior wound culture. Minimal drainage documented overnight. -ID recommendations: Ceftriaxone IV and Flagyl IV -General surgery/IR recommendations: IR to reevaluate for possible additional percutaneous drain placement  Leukocytosis Blood cell count of 19,200 on admission.  Secondary to postoperative infection. No associated fevers. Down to around 15,000 and stable. Afebrile.  Rales Patient appears to be asymptomatic at this time.  History of diastolic heart failure. Chest x-ray with some atelectasis. Rales resolved. -Incentive  spirometer  Hypercholesterolemia Patient is on Lipitor as an outpatient which was held on admission. -Continue Lipitor 40 mg daily  Paroxysmal SVT Patient is managed on Toprol-XL 50 mg daily.  Currently rate controlled.  Started on metoprolol tartrate IV while inpatient. -Continue Toprol-XL 50 mg daily  Chronic diastolic heart failure Patient is managed on Toprol-XL as an outpatient.  Patient is not on ACEi/ARB.  Patient with evidence of possible rales in addition to edema. -Strict in and out and daily weights  Hypoalbuminemia Unsure if patient has malnutrition based off of albumin levels. -Dietitian recommendations (11/9): Ensure Plus High Protein po BID, each supplement provides 350 kcal and 20 grams of protein.    Normocytic anemia Slight drift in setting of recent surgery. Stable.  Thrombocytosis Likely reactive secondary to infection. Slowly improving.   DVT prophylaxis: SCDs Code Status:   Code Status: Full Code Family Communication: Son at bedside. Disposition Plan: Discharge home pending general surgery/ID recommendations for outpatient management   Consultants:  General surgery Interventional radiology Infectious disease  Procedures:  None  Antimicrobials: Zosyn IV   Subjective: Patient without concerns today. No abdominal pain, nausea, vomiting.  Objective: BP (!) 150/72 (BP Location: Left Arm)   Pulse 97   Temp 97.8 F (36.6 C) (Oral)   Resp 17   Ht '4\' 10"'$  (1.473 m)   Wt 56.7 kg   SpO2 96%   BMI 26.13 kg/m   Examination:  General exam: Appears calm and comfortable Respiratory system: Clear to auscultation. Respiratory effort normal. Cardiovascular system: S1 & S2 heard, RRR. Gastrointestinal system: Abdomen is soft and nontender. No organomegaly or masses felt. Normal bowel sounds heard. Central nervous system: Alert and oriented. No focal neurological deficits. Musculoskeletal: No edema. No calf tenderness Skin: No cyanosis. No  rashes Psychiatry: Judgement and insight appear normal. Mood & affect appropriate.    Data Reviewed: I have  personally reviewed following labs and imaging studies  CBC Lab Results  Component Value Date   WBC 15.5 (H) 02/09/2022   RBC 4.09 02/09/2022   HGB 12.0 02/09/2022   HCT 38.3 02/09/2022   MCV 93.6 02/09/2022   MCH 29.3 02/09/2022   PLT 502 (H) 02/09/2022   MCHC 31.3 02/09/2022   RDW 14.8 02/09/2022   LYMPHSABS 2.0 02/09/2022   MONOABS 1.4 (H) 02/09/2022   EOSABS 0.4 02/09/2022   BASOSABS 0.1 09/60/4540     Last metabolic panel Lab Results  Component Value Date   NA 136 02/08/2022   K 4.4 02/08/2022   CL 106 02/08/2022   CO2 22 02/08/2022   BUN 8 02/08/2022   CREATININE 0.55 02/08/2022   GLUCOSE 76 02/08/2022   GFRNONAA >60 02/08/2022   GFRAA 78 11/09/2019   CALCIUM 8.3 (L) 02/08/2022   PROT 5.8 (L) 02/08/2022   ALBUMIN 2.4 (L) 02/08/2022   LABGLOB 2.3 01/26/2022   AGRATIO 1.5 01/26/2022   BILITOT 0.4 02/08/2022   ALKPHOS 85 02/08/2022   AST 51 (H) 02/08/2022   ALT 31 02/08/2022   ANIONGAP 8 02/08/2022    GFR: Estimated Creatinine Clearance: 37.6 mL/min (by C-G formula based on SCr of 0.55 mg/dL).  Recent Results (from the past 240 hour(s))  Aerobic/Anaerobic Culture w Gram Stain (surgical/deep wound)     Status: None   Collection Time: 02/04/22 11:35 AM   Specimen: Liver; Abscess  Result Value Ref Range Status   Specimen Description   Final    LIVER ABSCESS Performed at [facility] [address] [place], [place] [address]    Special Requests   Final    NONE Performed at [facility], [place] [address], [place], [place] [address]    Gram Stain   Final    ABUNDANT WBC PRESENT, PREDOMINANTLY PMN NO ORGANISMS SEEN    Culture   Final    FEW ESCHERICHIA COLI FEW KLEBSIELLA PNEUMONIAE NO ANAEROBES ISOLATED Performed at [facility], [place] [address] [place], [place] [address]    Report Status  02/09/2022 FINAL  Final   Organism ID, Bacteria ESCHERICHIA COLI  Final   Organism ID, Bacteria KLEBSIELLA PNEUMONIAE  Final      Susceptibility   Escherichia coli - MIC*    AMPICILLIN 16 INTERMEDIATE Intermediate     CEFAZOLIN <=4 SENSITIVE Sensitive     CEFEPIME <=0.12 SENSITIVE Sensitive     CEFTAZIDIME <=1 SENSITIVE Sensitive     CEFTRIAXONE <=0.25 SENSITIVE Sensitive     CIPROFLOXACIN <=0.25 SENSITIVE Sensitive     GENTAMICIN <=1 SENSITIVE Sensitive     IMIPENEM <=0.25 SENSITIVE Sensitive     TRIMETH/SULFA <=20 SENSITIVE Sensitive     AMPICILLIN/SULBACTAM 4 SENSITIVE Sensitive     PIP/TAZO <=4 SENSITIVE Sensitive     * FEW ESCHERICHIA COLI   Klebsiella pneumoniae - MIC*    AMPICILLIN >=32 RESISTANT Resistant     CEFAZOLIN >=64 RESISTANT Resistant     CEFEPIME <=0.12 SENSITIVE Sensitive     CEFTAZIDIME <=1 SENSITIVE Sensitive     CEFTRIAXONE <=0.25 SENSITIVE Sensitive     CIPROFLOXACIN <=0.25 SENSITIVE Sensitive     GENTAMICIN <=1 SENSITIVE Sensitive     IMIPENEM 0.5 SENSITIVE Sensitive     TRIMETH/SULFA <=20 SENSITIVE Sensitive     AMPICILLIN/SULBACTAM 16 INTERMEDIATE Intermediate     PIP/TAZO <=4 SENSITIVE Sensitive     * FEW KLEBSIELLA PNEUMONIAE      Radiology Studies: CT ABDOMEN PELVIS  W CONTRAST  Result Date: 02/08/2022 CLINICAL DATA:  Cholecystectomy 13 days ago with postoperative fluid collections EXAM: CT ABDOMEN AND PELVIS WITH CONTRAST TECHNIQUE: Multidetector CT imaging of the abdomen and pelvis was performed using the standard protocol following bolus administration of intravenous contrast. RADIATION DOSE REDUCTION: This exam was performed according to the departmental dose-optimization program which includes automated exposure control, adjustment of the mA and/or kV according to patient size and/or use of iterative reconstruction technique. CONTRAST:  156m OMNIPAQUE IOHEXOL 300 MG/ML  SOLN COMPARISON:  02/03/2022 and the CT-guided drain placement of 02/04/2022.  FINDINGS: Lower chest: Clustered nodularity within the inferior right upper lobe, likely postinfectious or inflammatory. Scarring in the right middle lobe. Small right pleural effusion is similar. Mild cardiomegaly. Hepatobiliary: No focal liver lesion. Cholecystectomy with mild intrahepatic biliary duct dilatation. The common duct measures maximally 1.0 cm in the porta hepatis, similar. Subtle hyperattenuation in the dependent common duct including on 35/4 is likely present on the prior. Surgical drain terminates adjacent the left lobe of the liver, without surrounding fluid. Right sided percutaneous drain with ill-defined surrounding collection of 2.0 x 1.9 cm on 38/4 versus 2.7 x 1.8 cm on the prior diagnostic CT. The collection about the inferior right hepatic lobe measures 4.9 x 1.6 cm on 45/4 versus 5.7 x 2.0 cm on the prior exam. Smaller right perihepatic collection of 1.3 cm on 37/4 is similar 1.1 cm on the prior. Anterior right perihepatic collection measures 4.1 x 1.1 cm on 43/4 versus 5.0 x 1.2 cm previously. Pancreas: Normal pancreas for age. No duct dilatation or acute inflammation. Spleen: Normal in size, without focal abnormality. Adrenals/Urinary Tract: Normal adrenal glands. Bilateral too small to characterize renal lesions are most likely cysts . In the absence of clinically indicated signs/symptoms require(s) no independent follow-up. No hydronephrosis. Degraded evaluation of the pelvis, secondary to beam hardening artifact from right hip arthroplasty. Grossly normal urinary bladder. Stomach/Bowel: Proximal gastric underdistention. Normal stomach, without wall thickening. Resolved right-sided colonic wall thickening. Normal terminal ileum and appendix. Normal small bowel. Vascular/Lymphatic: Advanced aortic and branch vessel atherosclerosis. No abdominopelvic adenopathy. Reproductive: Normal uterus. Prominent gonadal veins can be seen with pelvic congestion syndrome. No adnexal mass. Other: No  significant free fluid. Musculoskeletal: Right hip arthroplasty. Convex left lumbar spine curvature. IMPRESSION: 1. Placement of right upper quadrant percutaneous drain. Mild decrease in size of the majority of the perihepatic collections as detailed above. A right perihepatic 1.3 cm collection is similar to on the prior exam. 2. Mild intrahepatic biliary duct dilatation. Possible dependent common duct stones or sludge. If bilirubin is elevated, consider MRCP. 3. Small right pleural effusion 4. Resolved right-sided colitis 5.  Aortic Atherosclerosis (ICD10-I70.0). 6. Degraded evaluation of the pelvis, secondary to beam hardening artifact from right hip arthroplasty. Electronically Signed   By: KAbigail MiyamotoM.D.   On: 02/08/2022 14:20      LOS: 6 days    RCordelia Poche MD [facility] 02/09/2022, 12:04 PM   If 7PM-7AM, please contact night-coverage [email or web address]

## 2022-02-09 NOTE — Progress Notes (Signed)
[facility]  Date of Admission:  02/03/2022           Reason for visit: Follow up on liver abscess  Current antibiotics: Ceftriaxone  Flagyl  ASSESSMENT:    88 y.o. female admitted with:  # Gangrenous cholecystitis # Liver abscess # Cultures with Klebsiella pna and E coli  Patient status post cholecystectomy due to gangrenous cholecystitis and liver abscess 01/27/2022.  Intraoperative cultures at that time grew Klebsiella pneumoniae and E. coli.  Discharged home on Augmentin, however, Klebsiella noted to have resistance.  She had a surgical JP drain placed at the time of her surgery that remained in place until 11/13.    Repeat CT scan at admission showed surgical drain positioned in the gallbladder fossa with residual small fluid collection measuring 2.7 x 1.8 cm.  CT scan noted additional small fluid collections measuring upwards of 5.7 x 2.0 cm and 5.0 x 1.2 cm.  Status post IR guided drain placement 02/04/2022 into this hepatic abscess.  Cultures grew Kleb pna and E. coli.   Follow up CT done 02/08/22 showed mild decrease in size of the majority of the perihepatic collections.    RECOMMENDATIONS:    Continue ceftriaxone and flagyl Await IR follow up regarding yesterday's CT scan to see if any further drain intervention is needed Discussed IV antibiotics with patient today given that her oral options are limited to FQ or TMP SMX Lab monitoring Will follow   Principal Problem:   Liver abscess Active Problems:   Pure hypercholesterolemia   Paroxysmal SVT (supraventricular tachycardia)   Gangrenous cholecystitis   Postoperative infection, initial encounter   Grade I diastolic dysfunction   Protein-calorie malnutrition, severe (HCC)   Normocytic anemia   Malnutrition of moderate degree    MEDICATIONS:    Scheduled Meds:  acetaminophen  1,000 mg Oral Q6H   atorvastatin  40 mg Oral QHS   enoxaparin (LOVENOX) injection  40 mg  Subcutaneous Q24H   feeding supplement  237 mL Oral BID BM   lidocaine  1 patch Transdermal Q24H   metoprolol succinate  50 mg Oral Daily   metroNIDAZOLE  500 mg Oral Q12H   psyllium  1 packet Oral Daily   saccharomyces boulardii  250 mg Oral BID   sodium chloride flush  5 mL Intracatheter Q8H   Continuous Infusions:  cefTRIAXone (ROCEPHIN)  IV 2 g (02/09/22 0841)   PRN Meds:.bisacodyl, docusate sodium, HYDROmorphone (DILAUDID) injection, lidocaine (PF), loperamide, midazolam, ondansetron **OR** ondansetron (ZOFRAN) IV, oxyCODONE, polyethylene glycol  SUBJECTIVE:   24 hour events:  No acute events CT done yesterday Surgical drain removed Working with PT Afebrile WBC remains high but stable   She is feeling pretty good.  She walked with PT.  Her son is getting a cup of coffee.  Review of Systems  All other systems reviewed and are negative.     OBJECTIVE:   Blood pressure (!) 150/72, pulse 97, temperature 97.8 F (36.6 C), temperature source Oral, resp. rate 17, height '4\' 10"'$  (1.473 m), weight 56.7 kg, SpO2 96 %. Body mass index is 26.13 kg/m.  Physical Exam Constitutional:      Appearance: Normal appearance.  HENT:     Head: Normocephalic and atraumatic.  Eyes:     Extraocular Movements: Extraocular movements intact.     Conjunctiva/sclera: Conjunctivae normal.  Abdominal:     Comments: IR drain remains in place.   Musculoskeletal:  General: Normal range of motion.  Skin:    General: Skin is warm and dry.  Neurological:     General: No focal deficit present.     Mental Status: She is alert and oriented to person, place, and time.  Psychiatric:        Mood and Affect: Mood normal.        Behavior: Behavior normal.      Lab Results: Lab Results  Component Value Date   WBC 15.5 (H) 02/09/2022   HGB 12.0 02/09/2022   HCT 38.3 02/09/2022   MCV 93.6 02/09/2022   PLT 502 (H) 02/09/2022    Lab Results  Component Value Date   NA 136 02/08/2022    K 4.4 02/08/2022   CO2 22 02/08/2022   GLUCOSE 76 02/08/2022   BUN 8 02/08/2022   CREATININE 0.55 02/08/2022   CALCIUM 8.3 (L) 02/08/2022   GFRNONAA >60 02/08/2022   GFRAA 78 11/09/2019    Lab Results  Component Value Date   ALT 31 02/08/2022   AST 51 (H) 02/08/2022   ALKPHOS 85 02/08/2022   BILITOT 0.4 02/08/2022    No results found for: "CRP"  No results found for: "ESRSEDRATE"   I have reviewed the micro and lab results in Epic.  Imaging: CT ABDOMEN PELVIS W CONTRAST  Result Date: 02/08/2022 CLINICAL DATA:  Cholecystectomy 13 days ago with postoperative fluid collections EXAM: CT ABDOMEN AND PELVIS WITH CONTRAST TECHNIQUE: Multidetector CT imaging of the abdomen and pelvis was performed using the standard protocol following bolus administration of intravenous contrast. RADIATION DOSE REDUCTION: This exam was performed according to the departmental dose-optimization program which includes automated exposure control, adjustment of the mA and/or kV according to patient size and/or use of iterative reconstruction technique. CONTRAST:  159m OMNIPAQUE IOHEXOL 300 MG/ML  SOLN COMPARISON:  02/03/2022 and the CT-guided drain placement of 02/04/2022. FINDINGS: Lower chest: Clustered nodularity within the inferior right upper lobe, likely postinfectious or inflammatory. Scarring in the right middle lobe. Small right pleural effusion is similar. Mild cardiomegaly. Hepatobiliary: No focal liver lesion. Cholecystectomy with mild intrahepatic biliary duct dilatation. The common duct measures maximally 1.0 cm in the porta hepatis, similar. Subtle hyperattenuation in the dependent common duct including on 35/4 is likely present on the prior. Surgical drain terminates adjacent the left lobe of the liver, without surrounding fluid. Right sided percutaneous drain with ill-defined surrounding collection of 2.0 x 1.9 cm on 38/4 versus 2.7 x 1.8 cm on the prior diagnostic CT. The collection about the  inferior right hepatic lobe measures 4.9 x 1.6 cm on 45/4 versus 5.7 x 2.0 cm on the prior exam. Smaller right perihepatic collection of 1.3 cm on 37/4 is similar 1.1 cm on the prior. Anterior right perihepatic collection measures 4.1 x 1.1 cm on 43/4 versus 5.0 x 1.2 cm previously. Pancreas: Normal pancreas for age. No duct dilatation or acute inflammation. Spleen: Normal in size, without focal abnormality. Adrenals/Urinary Tract: Normal adrenal glands. Bilateral too small to characterize renal lesions are most likely cysts . In the absence of clinically indicated signs/symptoms require(s) no independent follow-up. No hydronephrosis. Degraded evaluation of the pelvis, secondary to beam hardening artifact from right hip arthroplasty. Grossly normal urinary bladder. Stomach/Bowel: Proximal gastric underdistention. Normal stomach, without wall thickening. Resolved right-sided colonic wall thickening. Normal terminal ileum and appendix. Normal small bowel. Vascular/Lymphatic: Advanced aortic and branch vessel atherosclerosis. No abdominopelvic adenopathy. Reproductive: Normal uterus. Prominent gonadal veins can be seen with pelvic congestion syndrome. No adnexal  mass. Other: No significant free fluid. Musculoskeletal: Right hip arthroplasty. Convex left lumbar spine curvature. IMPRESSION: 1. Placement of right upper quadrant percutaneous drain. Mild decrease in size of the majority of the perihepatic collections as detailed above. A right perihepatic 1.3 cm collection is similar to on the prior exam. 2. Mild intrahepatic biliary duct dilatation. Possible dependent common duct stones or sludge. If bilirubin is elevated, consider MRCP. 3. Small right pleural effusion 4. Resolved right-sided colitis 5.  Aortic Atherosclerosis (ICD10-I70.0). 6. Degraded evaluation of the pelvis, secondary to beam hardening artifact from right hip arthroplasty. Electronically Signed   By: Abigail Miyamoto M.D.   On: 02/08/2022 14:20      Imaging independently reviewed in Epic.    [facility] [phone] pager 02/09/2022, 12:02 PM

## 2022-02-10 ENCOUNTER — Inpatient Hospital Stay: Payer: Self-pay

## 2022-02-10 LAB — CBC WITH DIFFERENTIAL/PLATELET
Abs Immature Granulocytes: 0.11 10*3/uL — ABNORMAL HIGH (ref 0.00–0.07)
Basophils Absolute: 0.1 10*3/uL (ref 0.0–0.1)
Basophils Relative: 1 %
Eosinophils Absolute: 0.3 10*3/uL (ref 0.0–0.5)
Eosinophils Relative: 3 %
HCT: 34.3 % — ABNORMAL LOW (ref 36.0–46.0)
Hemoglobin: 11 g/dL — ABNORMAL LOW (ref 12.0–15.0)
Immature Granulocytes: 1 %
Lymphocytes Relative: 14 %
Lymphs Abs: 1.8 10*3/uL (ref 0.7–4.0)
MCH: 29.6 pg (ref 26.0–34.0)
MCHC: 32.1 g/dL (ref 30.0–36.0)
MCV: 92.2 fL (ref 80.0–100.0)
Monocytes Absolute: 1.4 10*3/uL — ABNORMAL HIGH (ref 0.1–1.0)
Monocytes Relative: 11 %
Neutro Abs: 9.5 10*3/uL — ABNORMAL HIGH (ref 1.7–7.7)
Neutrophils Relative %: 70 %
Platelets: 437 10*3/uL — ABNORMAL HIGH (ref 150–400)
RBC: 3.72 MIL/uL — ABNORMAL LOW (ref 3.87–5.11)
RDW: 15 % (ref 11.5–15.5)
WBC: 13.2 10*3/uL — ABNORMAL HIGH (ref 4.0–10.5)
nRBC: 0 % (ref 0.0–0.2)

## 2022-02-10 MED ORDER — BOOST / RESOURCE BREEZE PO LIQD CUSTOM: 1.0000 | Freq: Three times a day (TID) | ORAL | Status: DC

## 2022-02-10 NOTE — Progress Notes (Signed)
Mobility Specialist - Progress Note   02/10/22 1402  Mobility  Activity Ambulated independently in room  Level of Assistance Standby assist, set-up cues, supervision of patient - no hands on  Assistive Device Front wheel walker  Distance Ambulated (ft) 450 ft  Activity Response Tolerated well  Mobility Referral Yes  $Mobility charge 1 Mobility   Pt received in chair and agreed to mobility, no c/o pain nor discomfort. Pt returned to chair with all needs met.   Roderick Pee Mobility Specialist

## 2022-02-10 NOTE — Progress Notes (Signed)
PROGRESS NOTE    DERENDA GIDDINGS  WUJ:[id number] DOB: 1933/06/07 DOA: 02/03/2022 PCP: Rita Ohara, MD   Brief Narrative: Alexis Hines is a 88 y.o. female with a history of osteoarthritis, basal cell cancer of the nose, diverticulosis, migraine headaches, paroxysmal SVT, grade 1 diastolic dysfunction who presented secondary to decreased JP drain output and worsening abdominal pain.  She was found to have evidence of postoperative infection which appears to be secondary to inadequate coverage of bacterial infection.  Culture results.  General surgery consulted on admission.  Patient started on Zosyn IV for coverage.  Interventional radiology consulted for liver abscess drain placement. Leukocytosis stable. Plan for repeat CT abdomen/pelvis.   Assessment and Plan:  Postoperative infection In setting of recent laparoscopic cholecystectomy.  Patient was discharged on Augmentin, however intraoperative liver abscess culture significant for sensitive E. coli. and ampicillin/Unasyn/cefazolin resistant Klebsiella pneumoniae.  CT imaging on admission significant for multiple small liver abscesses. General surgery consulted on admission.  Patient started on Zosyn for management.  Surgery recommending interventional radiology for liver drain which was successfully placed on 11/8. Wound culture (11/8) significant for E. Coli and Klebsiella pneumoniae as noted in prior wound culture. Surgical JP drain removed. -ID recommendations: Ceftriaxone IV and Flagyl IV; outpatient antibiotic pending per ID  Leukocytosis Blood cell count of 19,200 on admission.  Secondary to postoperative infection. No associated fevers. Down to 13,200. Afebrile.  Rales Patient appears to be asymptomatic at this time.  History of diastolic heart failure. Chest x-ray with some atelectasis. Rales resolved. -Incentive spirometer  Hypercholesterolemia Patient is on Lipitor as an outpatient which was held on admission. -Continue  Lipitor 40 mg daily  Paroxysmal SVT Patient is managed on Toprol-XL 50 mg daily.  Currently rate controlled.  Started on metoprolol tartrate IV while inpatient. -Continue Toprol-XL 50 mg daily  Chronic diastolic heart failure Patient is managed on Toprol-XL as an outpatient.  Patient is not on ACEi/ARB.  Patient with evidence of possible rales in addition to edema. -Strict in and out and daily weights  Hypoalbuminemia Unsure if patient has malnutrition based off of albumin levels. -Dietitian recommendations (11/9): Ensure Plus High Protein po BID, each supplement provides 350 kcal and 20 grams of protein.    Normocytic anemia Slight drift in setting of recent surgery. Stable.  Thrombocytosis Likely reactive secondary to infection. Slowly improving.   DVT prophylaxis: SCDs Code Status:   Code Status: Full Code Family Communication: Son on telephone. Disposition Plan: Discharge home pending ID recommendations for outpatient management   Consultants:  General surgery Interventional radiology Infectious disease  Procedures:  None  Antimicrobials: Zosyn IV   Subjective: Patient reports some abdominal discomfort, attributing discomfort to drain. Overall, abdominal pain is improved. No nausea or vomiting. Diarrhea has improved.  Objective: BP 135/65 (BP Location: Left Arm)   Pulse 95   Temp 99.1 F (37.3 C) (Oral)   Resp 17   Ht '4\' 10"'$  (1.473 m)   Wt 53.9 kg   SpO2 94%   BMI 24.84 kg/m   Examination:  General exam: Appears calm and comfortable Respiratory system: Clear to auscultation. Respiratory effort normal. Cardiovascular system: S1 & S2 heard. Gastrointestinal system: Abdomen is nondistended, soft and nontender. Normal bowel sounds heard. Drain with no output currently at bedside Central nervous system: Alert and oriented. No focal neurological deficits. Musculoskeletal: No edema. No calf tenderness Skin: No cyanosis. No rashes Psychiatry: Judgement and  insight appear normal. Mood & affect appropriate.    Data Reviewed:  I have personally reviewed following labs and imaging studies  CBC Lab Results  Component Value Date   WBC 13.2 (H) 02/10/2022   RBC 3.72 (L) 02/10/2022   HGB 11.0 (L) 02/10/2022   HCT 34.3 (L) 02/10/2022   MCV 92.2 02/10/2022   MCH 29.6 02/10/2022   PLT 437 (H) 02/10/2022   MCHC 32.1 02/10/2022   RDW 15.0 02/10/2022   LYMPHSABS 1.8 02/10/2022   MONOABS 1.4 (H) 02/10/2022   EOSABS 0.3 02/10/2022   BASOSABS 0.1 50/93/2671     Last metabolic panel Lab Results  Component Value Date   NA 136 02/08/2022   K 4.4 02/08/2022   CL 106 02/08/2022   CO2 22 02/08/2022   BUN 8 02/08/2022   CREATININE 0.55 02/08/2022   GLUCOSE 76 02/08/2022   GFRNONAA >60 02/08/2022   GFRAA 78 11/09/2019   CALCIUM 8.3 (L) 02/08/2022   PROT 5.8 (L) 02/08/2022   ALBUMIN 2.4 (L) 02/08/2022   LABGLOB 2.3 01/26/2022   AGRATIO 1.5 01/26/2022   BILITOT 0.4 02/08/2022   ALKPHOS 85 02/08/2022   AST 51 (H) 02/08/2022   ALT 31 02/08/2022   ANIONGAP 8 02/08/2022    GFR: Estimated Creatinine Clearance: 36.7 mL/min (by C-G formula based on SCr of 0.55 mg/dL).  Recent Results (from the past 240 hour(s))  Aerobic/Anaerobic Culture w Gram Stain (surgical/deep wound)     Status: None   Collection Time: 02/04/22 11:35 AM   Specimen: Liver; Abscess  Result Value Ref Range Status   Specimen Description   Final    LIVER ABSCESS Performed at [facility] [address], [place], [place] [address]    Special Requests   Final    NONE Performed at [facility], [place] [address], [place], [place] [address]    Gram Stain   Final    ABUNDANT WBC PRESENT, PREDOMINANTLY PMN NO ORGANISMS SEEN    Culture   Final    FEW ESCHERICHIA COLI FEW KLEBSIELLA PNEUMONIAE NO ANAEROBES ISOLATED Performed at [facility], [place] [address], [place], [place] [address]    Report Status 02/09/2022 FINAL  Final    Organism ID, Bacteria ESCHERICHIA COLI  Final   Organism ID, Bacteria KLEBSIELLA PNEUMONIAE  Final      Susceptibility   Escherichia coli - MIC*    AMPICILLIN 16 INTERMEDIATE Intermediate     CEFAZOLIN <=4 SENSITIVE Sensitive     CEFEPIME <=0.12 SENSITIVE Sensitive     CEFTAZIDIME <=1 SENSITIVE Sensitive     CEFTRIAXONE <=0.25 SENSITIVE Sensitive     CIPROFLOXACIN <=0.25 SENSITIVE Sensitive     GENTAMICIN <=1 SENSITIVE Sensitive     IMIPENEM <=0.25 SENSITIVE Sensitive     TRIMETH/SULFA <=20 SENSITIVE Sensitive     AMPICILLIN/SULBACTAM 4 SENSITIVE Sensitive     PIP/TAZO <=4 SENSITIVE Sensitive     * FEW ESCHERICHIA COLI   Klebsiella pneumoniae - MIC*    AMPICILLIN >=32 RESISTANT Resistant     CEFAZOLIN >=64 RESISTANT Resistant     CEFEPIME <=0.12 SENSITIVE Sensitive     CEFTAZIDIME <=1 SENSITIVE Sensitive     CEFTRIAXONE <=0.25 SENSITIVE Sensitive     CIPROFLOXACIN <=0.25 SENSITIVE Sensitive     GENTAMICIN <=1 SENSITIVE Sensitive     IMIPENEM 0.5 SENSITIVE Sensitive     TRIMETH/SULFA <=20 SENSITIVE Sensitive     AMPICILLIN/SULBACTAM 16 INTERMEDIATE Intermediate     PIP/TAZO <=4 SENSITIVE Sensitive     * FEW KLEBSIELLA PNEUMONIAE  Radiology Studies: CT ABDOMEN PELVIS W CONTRAST  Result Date: 02/08/2022 CLINICAL DATA:  Cholecystectomy 13 days ago with postoperative fluid collections EXAM: CT ABDOMEN AND PELVIS WITH CONTRAST TECHNIQUE: Multidetector CT imaging of the abdomen and pelvis was performed using the standard protocol following bolus administration of intravenous contrast. RADIATION DOSE REDUCTION: This exam was performed according to the departmental dose-optimization program which includes automated exposure control, adjustment of the mA and/or kV according to patient size and/or use of iterative reconstruction technique. CONTRAST:  189m OMNIPAQUE IOHEXOL 300 MG/ML  SOLN COMPARISON:  02/03/2022 and the CT-guided drain placement of 02/04/2022. FINDINGS: Lower chest:  Clustered nodularity within the inferior right upper lobe, likely postinfectious or inflammatory. Scarring in the right middle lobe. Small right pleural effusion is similar. Mild cardiomegaly. Hepatobiliary: No focal liver lesion. Cholecystectomy with mild intrahepatic biliary duct dilatation. The common duct measures maximally 1.0 cm in the porta hepatis, similar. Subtle hyperattenuation in the dependent common duct including on 35/4 is likely present on the prior. Surgical drain terminates adjacent the left lobe of the liver, without surrounding fluid. Right sided percutaneous drain with ill-defined surrounding collection of 2.0 x 1.9 cm on 38/4 versus 2.7 x 1.8 cm on the prior diagnostic CT. The collection about the inferior right hepatic lobe measures 4.9 x 1.6 cm on 45/4 versus 5.7 x 2.0 cm on the prior exam. Smaller right perihepatic collection of 1.3 cm on 37/4 is similar 1.1 cm on the prior. Anterior right perihepatic collection measures 4.1 x 1.1 cm on 43/4 versus 5.0 x 1.2 cm previously. Pancreas: Normal pancreas for age. No duct dilatation or acute inflammation. Spleen: Normal in size, without focal abnormality. Adrenals/Urinary Tract: Normal adrenal glands. Bilateral too small to characterize renal lesions are most likely cysts . In the absence of clinically indicated signs/symptoms require(s) no independent follow-up. No hydronephrosis. Degraded evaluation of the pelvis, secondary to beam hardening artifact from right hip arthroplasty. Grossly normal urinary bladder. Stomach/Bowel: Proximal gastric underdistention. Normal stomach, without wall thickening. Resolved right-sided colonic wall thickening. Normal terminal ileum and appendix. Normal small bowel. Vascular/Lymphatic: Advanced aortic and branch vessel atherosclerosis. No abdominopelvic adenopathy. Reproductive: Normal uterus. Prominent gonadal veins can be seen with pelvic congestion syndrome. No adnexal mass. Other: No significant free fluid.  Musculoskeletal: Right hip arthroplasty. Convex left lumbar spine curvature. IMPRESSION: 1. Placement of right upper quadrant percutaneous drain. Mild decrease in size of the majority of the perihepatic collections as detailed above. A right perihepatic 1.3 cm collection is similar to on the prior exam. 2. Mild intrahepatic biliary duct dilatation. Possible dependent common duct stones or sludge. If bilirubin is elevated, consider MRCP. 3. Small right pleural effusion 4. Resolved right-sided colitis 5.  Aortic Atherosclerosis (ICD10-I70.0). 6. Degraded evaluation of the pelvis, secondary to beam hardening artifact from right hip arthroplasty. Electronically Signed   By: KAbigail MiyamotoM.D.   On: 02/08/2022 14:20      LOS: 7 days    RCordelia Poche MD [facility] 02/10/2022, 9:46 AM   If 7PM-7AM, please contact night-coverage [email or web address]

## 2022-02-10 NOTE — Progress Notes (Signed)
[facility] for Infectious Disease  Date of Admission:  02/03/2022           Reason for visit: Follow up on liver abscess  Current antibiotics: Ceftriaxone Flagyl  ASSESSMENT:    88 y.o. female admitted with:  # Gangrenous cholecystitis # Liver abscess # Cultures with Klebsiella pna and E coli   Patient status post cholecystectomy due to gangrenous cholecystitis and liver abscess 01/27/2022.  Intraoperative cultures at that time grew Klebsiella pneumoniae and E. coli.  Discharged home on Augmentin, however, Klebsiella noted to have resistance.  She had a surgical JP drain placed at the time of her surgery that remained in place until 11/13.     Repeat CT scan at admission showed surgical drain positioned in the gallbladder fossa with residual small fluid collection measuring 2.7 x 1.8 cm.  CT scan noted additional small fluid collections measuring upwards of 5.7 x 2.0 cm and 5.0 x 1.2 cm.  Status post IR guided drain placement 02/04/2022 into this hepatic abscess.  Cultures grew Kleb pna and E. coli.    Follow up CT done 02/08/22 showed mild decrease in size of the majority of the perihepatic collections.  IR drain remains in place with plan for outpatient drain follow up.  RECOMMENDATIONS:    Continue ceftriaxone and Flagyl Discussed at length with patient and her son today regarding PICC line and IV therapy vs PO.  PO options limited by Bactrim and Cipro which could be problematic over the course of several weeks and side effect profile They feel comfortable to do IV therapy for now Will place PICC.   Total abx duration to be determined on repeat imaging and resolution of abscess See OPAT note below.  Will sign off, please call as needed  Diagnosis: Liver abscess  Culture Result: Kleb pna and E coli  No Known Allergies  OPAT Orders Discharge antibiotics to be given via PICC line Discharge antibiotics: Per pharmacy protocol  Rocephin 2g IV every 24 hours and  Flagyl 500 mg po every 12 hours   Duration:  4 weeks  End Date: 03/10/22  [facility] Care Per Protocol:  Home health RN for IV administration and teaching; PICC line care and labs.    Labs weekly while on IV antibiotics: _xx_ CBC with differential __ BMP _xx_ CMP __ CRP __ ESR __ Vancomycin trough __ CK  _xx_ Please pull PIC at completion of IV antibiotics __ Please leave PIC in place until doctor has seen patient or been notified  Fax weekly labs to [phone]  Clinic Follow Up Appt: 03/02/22 at 4pm with Dr Juleen China    Principal Problem:   Liver abscess Active Problems:   Pure hypercholesterolemia   Paroxysmal SVT (supraventricular tachycardia)   Gangrenous cholecystitis   Postoperative infection, initial encounter   Grade I diastolic dysfunction   Protein-calorie malnutrition, severe (HCC)   Normocytic anemia   Malnutrition of moderate degree    MEDICATIONS:    Scheduled Meds:  acetaminophen  1,000 mg Oral Q6H   atorvastatin  40 mg Oral QHS   enoxaparin (LOVENOX) injection  40 mg Subcutaneous Q24H   feeding supplement  1 Container Oral TID BM   feeding supplement  237 mL Oral BID BM   lidocaine  1 patch Transdermal Q24H   metoprolol succinate  50 mg Oral Daily   metroNIDAZOLE  500 mg Oral Q12H   psyllium  1 packet Oral Daily   saccharomyces boulardii  250 mg Oral  BID   Continuous Infusions:  cefTRIAXone (ROCEPHIN)  IV 2 g (02/10/22 1044)   PRN Meds:.bisacodyl, docusate sodium, HYDROmorphone (DILAUDID) injection, lidocaine (PF), midazolam, ondansetron **OR** ondansetron (ZOFRAN) IV, oxyCODONE, polyethylene glycol  SUBJECTIVE:   No events No complaints Concerned about doing well at home  Review of Systems  All other systems reviewed and are negative.     OBJECTIVE:   Blood pressure (!) 146/79, pulse (!) 102, temperature 97.7 F (36.5 C), temperature source Oral, resp. rate 20, height _0  (1.473 m), weight 53.9 kg, SpO2 98 %. Body mass index  is 24.84 kg/m.  Physical Exam Constitutional:      Appearance: Normal appearance.  HENT:     Head: Normocephalic and atraumatic.  Eyes:     Extraocular Movements: Extraocular movements intact.     Conjunctiva/sclera: Conjunctivae normal.  Pulmonary:     Effort: Pulmonary effort is normal. No respiratory distress.  Abdominal:     General: There is no distension.     Palpations: Abdomen is soft.     Tenderness: There is no abdominal tenderness.  Musculoskeletal:     Cervical back: Normal range of motion and neck supple.  Skin:    General: Skin is warm and dry.  Neurological:     General: No focal deficit present.     Mental Status: She is alert. Mental status is at baseline.  Psychiatric:        Mood and Affect: Mood normal.        Behavior: Behavior normal.      Lab Results: Lab Results  Component Value Date   WBC 13.2 (H) 02/10/2022   HGB 11.0 (L) 02/10/2022   HCT 34.3 (L) 02/10/2022   MCV 92.2 02/10/2022   PLT 437 (H) 02/10/2022    Lab Results  Component Value Date   NA 136 02/08/2022   K 4.4 02/08/2022   CO2 22 02/08/2022   GLUCOSE 76 02/08/2022   BUN 8 02/08/2022   CREATININE 0.55 02/08/2022   CALCIUM 8.3 (L) 02/08/2022   GFRNONAA >60 02/08/2022   GFRAA 78 11/09/2019    Lab Results  Component Value Date   ALT 31 02/08/2022   AST 51 (H) 02/08/2022   ALKPHOS 85 02/08/2022   BILITOT 0.4 02/08/2022    No results found for: "CRP"  No results found for: "ESRSEDRATE"   I have reviewed the micro and lab results in Epic.  Imaging: No results found.   Imaging independently reviewed in Epic.    Raynelle [facility] [phone] pager 02/10/2022, 3:16 PM  I have personally spent 50 minutes involved in face-to-face and non-face-to-face activities for this patient on the day of the visit. Professional time spent includes the following activities: Preparing to see the patient (review of  tests), Obtaining and/or reviewing separately obtained history (admission/discharge record), Performing a medically appropriate examination and/or evaluation , Ordering medications/tests/procedures, referring and communicating with other health care professionals, Documenting clinical information in the EMR, Independently interpreting results (not separately reported), Communicating results to the patient/family/caregiver, Counseling and educating the patient/family/caregiver and Care coordination (not separately reported).

## 2022-02-10 NOTE — Progress Notes (Signed)
Lovenox refused. MD Nettey notified.

## 2022-02-10 NOTE — Progress Notes (Signed)
Mobility Specialist - Progress Note   02/10/22 1039  Mobility  Activity Ambulated independently in hallway  Level of Assistance Standby assist, set-up cues, supervision of patient - no hands on  Assistive Device Front wheel walker  Distance Ambulated (ft) 500 ft  Activity Response Tolerated well  Mobility Referral Yes  $Mobility charge 1 Mobility   Pt received in restroom and agreed to mobility, no c/o pain nor discomfort during session. Pt returned to chair with all needs met.    Roderick Pee Mobility Specialist

## 2022-02-10 NOTE — Progress Notes (Signed)
Progress Note     Subjective: Pt with no diarrhea since day before yesterday. Tolerating diet although does not have much appetite. Does not like ensure. Seems anxious about discharge.   Objective: Vital signs in last 24 hours: Temp:  [98 F (36.7 C)-99.1 F (37.3 C)] 99.1 F (37.3 C) (11/14 0600) Pulse Rate:  [92-95] 95 (11/14 0600) Resp:  [16-18] 17 (11/14 0600) BP: (135-151)/(61-75) 135/65 (11/14 0600) SpO2:  [94 %-97 %] 94 % (11/14 0600) Weight:  [53.9 kg] 53.9 kg (11/14 0600) Last BM Date : 02/07/22  Intake/Output from previous day: 11/13 0701 - 11/14 0700 In: 6513 [P.O.:960; IV Piggyback:5553] Out: 35 [Drains:35] Intake/Output this shift: No intake/output data recorded.  PE: General: pleasant, WD, elderly female who is laying in bed in NAD HEENT: sclera anicteric  Heart: regular, rate, and rhythm.   Lungs: Respiratory effort nonlabored Abd: soft, NT, ND, IR drain with small amount fluid, incisions C/D/I Psych: A&Ox3 with an anxious affect.    Lab Results:  Recent Labs    02/09/22 0606 02/10/22 0533  WBC 15.5* 13.2*  HGB 12.0 11.0*  HCT 38.3 34.3*  PLT 502* 437*    BMET Recent Labs    02/08/22 0619  NA 136  K 4.4  CL 106  CO2 22  GLUCOSE 76  BUN 8  CREATININE 0.55  CALCIUM 8.3*    PT/INR No results for input(s): "LABPROT", "INR" in the last 72 hours. CMP     Component Value Date/Time   NA 136 02/08/2022 0619   NA 135 01/26/2022 1202   K 4.4 02/08/2022 0619   CL 106 02/08/2022 0619   CO2 22 02/08/2022 0619   GLUCOSE 76 02/08/2022 0619   BUN 8 02/08/2022 0619   BUN 11 01/26/2022 1202   CREATININE 0.55 02/08/2022 0619   CALCIUM 8.3 (L) 02/08/2022 0619   PROT 5.8 (L) 02/08/2022 0619   PROT 5.8 (L) 01/26/2022 1202   ALBUMIN 2.4 (L) 02/08/2022 0619   ALBUMIN 3.5 (L) 01/26/2022 1202   AST 51 (H) 02/08/2022 0619   ALT 31 02/08/2022 0619   ALKPHOS 85 02/08/2022 0619   BILITOT 0.4 02/08/2022 0619   BILITOT 0.9 01/26/2022 1202    GFRNONAA >60 02/08/2022 0619   GFRAA 78 11/09/2019 0947   Lipase     Component Value Date/Time   LIPASE 25 02/03/2022 1102       Studies/Results: CT ABDOMEN PELVIS W CONTRAST  Result Date: 02/08/2022 CLINICAL DATA:  Cholecystectomy 13 days ago with postoperative fluid collections EXAM: CT ABDOMEN AND PELVIS WITH CONTRAST TECHNIQUE: Multidetector CT imaging of the abdomen and pelvis was performed using the standard protocol following bolus administration of intravenous contrast. RADIATION DOSE REDUCTION: This exam was performed according to the departmental dose-optimization program which includes automated exposure control, adjustment of the mA and/or kV according to patient size and/or use of iterative reconstruction technique. CONTRAST:  139m OMNIPAQUE IOHEXOL 300 MG/ML  SOLN COMPARISON:  02/03/2022 and the CT-guided drain placement of 02/04/2022. FINDINGS: Lower chest: Clustered nodularity within the inferior right upper lobe, likely postinfectious or inflammatory. Scarring in the right middle lobe. Small right pleural effusion is similar. Mild cardiomegaly. Hepatobiliary: No focal liver lesion. Cholecystectomy with mild intrahepatic biliary duct dilatation. The common duct measures maximally 1.0 cm in the porta hepatis, similar. Subtle hyperattenuation in the dependent common duct including on 35/4 is likely present on the prior. Surgical drain terminates adjacent the left lobe of the liver, without surrounding fluid. Right sided percutaneous drain  with ill-defined surrounding collection of 2.0 x 1.9 cm on 38/4 versus 2.7 x 1.8 cm on the prior diagnostic CT. The collection about the inferior right hepatic lobe measures 4.9 x 1.6 cm on 45/4 versus 5.7 x 2.0 cm on the prior exam. Smaller right perihepatic collection of 1.3 cm on 37/4 is similar 1.1 cm on the prior. Anterior right perihepatic collection measures 4.1 x 1.1 cm on 43/4 versus 5.0 x 1.2 cm previously. Pancreas: Normal pancreas for age.  No duct dilatation or acute inflammation. Spleen: Normal in size, without focal abnormality. Adrenals/Urinary Tract: Normal adrenal glands. Bilateral too small to characterize renal lesions are most likely cysts . In the absence of clinically indicated signs/symptoms require(s) no independent follow-up. No hydronephrosis. Degraded evaluation of the pelvis, secondary to beam hardening artifact from right hip arthroplasty. Grossly normal urinary bladder. Stomach/Bowel: Proximal gastric underdistention. Normal stomach, without wall thickening. Resolved right-sided colonic wall thickening. Normal terminal ileum and appendix. Normal small bowel. Vascular/Lymphatic: Advanced aortic and branch vessel atherosclerosis. No abdominopelvic adenopathy. Reproductive: Normal uterus. Prominent gonadal veins can be seen with pelvic congestion syndrome. No adnexal mass. Other: No significant free fluid. Musculoskeletal: Right hip arthroplasty. Convex left lumbar spine curvature. IMPRESSION: 1. Placement of right upper quadrant percutaneous drain. Mild decrease in size of the majority of the perihepatic collections as detailed above. A right perihepatic 1.3 cm collection is similar to on the prior exam. 2. Mild intrahepatic biliary duct dilatation. Possible dependent common duct stones or sludge. If bilirubin is elevated, consider MRCP. 3. Small right pleural effusion 4. Resolved right-sided colitis 5.  Aortic Atherosclerosis (ICD10-I70.0). 6. Degraded evaluation of the pelvis, secondary to beam hardening artifact from right hip arthroplasty. Electronically Signed   By: Abigail Miyamoto M.D.   On: 02/08/2022 14:20    Anti-infectives: Anti-infectives (From admission, onward)    Start     Dose/Rate Route Frequency Ordered Stop   02/06/22 1045  cefTRIAXone (ROCEPHIN) 2 g in sodium chloride 0.9 % 100 mL IVPB        2 g 200 mL/hr over 30 Minutes Intravenous Daily 02/06/22 0945     02/06/22 1045  metroNIDAZOLE (FLAGYL) tablet 500 mg         500 mg Oral Every 12 hours 02/06/22 0945     02/03/22 1800  piperacillin-tazobactam (ZOSYN) IVPB 3.375 g  Status:  Discontinued        3.375 g 12.5 mL/hr over 240 Minutes Intravenous Every 8 hours 02/03/22 1519 02/06/22 0945   02/03/22 1515  piperacillin-tazobactam (ZOSYN) IVPB 3.375 g  Status:  Discontinued        3.375 g 100 mL/hr over 30 Minutes Intravenous Every 8 hours 02/03/22 1513 02/03/22 1519   02/03/22 1130  piperacillin-tazobactam (ZOSYN) IVPB 3.375 g        3.375 g 100 mL/hr over 30 Minutes Intravenous  Once 02/03/22 1127 02/03/22 1231        Assessment/Plan  Abdominal pain, leukocytosis POD 14 status post laparoscopic cholecystectomy with JP drain placement 10/31 Dr. Thermon Leyland for gangrenous cholecystitis with intra-op finding of liver abscess - WBC 13 from 15, afebrile. ID following and rocephin/flagyl - intra-op culture of liver abscess 10/31 with e coli and klebsiella with resistance to ampicillin. Cx from IR drain placement 11/8 with the same  - CT 11/7 with additional fluid collections noted - s/p IR drain 11/8  - CT 11/12 with resolution in fluid around surgical drain, slight improvement in other collections - IR does not recommend any repositioning  of drain or further drainage - patient is stable for discharge from a surgical perspective with drain clinic follow up and follow up with surgeon - discussed abx with ID today - will need to determine IV vs PO regimen. If pt needs home IV abx would need to ensure that she is comfortable with PICC care   Diarrhea - prn imodium, metamucil per TRH. Added probiotic 11/13 per family request    FEN: regular, boost  ID: [email or web address]. rocephin/flagyl 11/10>> VTE: LMWH Dispo: ID recs on abx    LOS: 7 days    Norm Parcel, [facility] Surgery 02/10/2022, 10:01 AM Please see Amion for pager number during day hours 7:00am-4:30pm    I personally saw the patient and performed a substantive portion of this  encounter, including a complete performance of at least one of the key components (MDM, Hx and/or Exam), in conjunction with the Advanced Practice Provider Barkley Boards, PA-C.

## 2022-02-10 NOTE — Progress Notes (Signed)
Referring Physician(s): Connor,C  Supervising Physician: Corrie Mckusick  Patient Status:  [facility] - In-pt  Chief Complaint:  Hepatic abscess post acute gangrenous cholecystitis/cholecystectomy, abdominal pain    Subjective: Pt without acute changes; son in room   Allergies: Patient has no known allergies.  Medications: Prior to Admission medications   Medication Sig Start Date End Date Taking? Authorizing Provider  acetaminophen (TYLENOL) 500 MG tablet Take 2 tablets (1,000 mg total) by mouth every 6 (six) hours as needed. Patient taking differently: Take 1,000 mg by mouth every 6 (six) hours as needed for moderate pain. 01/29/22  Yes Saverio Danker, PA-C  ADVIL 200 MG CAPS Take 200 mg by mouth every 6 (six) hours as needed (for headaches).   Yes [provider]  atorvastatin (LIPITOR) 40 MG tablet TAKE 1 TABLET BY MOUTH AT  BEDTIME Patient taking differently: Take 40 mg by mouth at bedtime. 11/27/21  Yes Rita Ohara, MD  Biotin 10000 MCG TABS Take 10,000 mcg by mouth in the morning.   Yes [provider]  Calcium Carbonate-Vitamin D (CALTRATE 600+D PO) Take 1 tablet by mouth daily.   Yes [provider]  ipratropium (ATROVENT) 0.06 % nasal spray USE 2 SPRAYS IN EACH NOSTRIL 3 TO 4 TIMES DAILY FOR RUNNY NOSE Patient taking differently: Place 2 sprays into both nostrils in the morning and at bedtime. 09/15/21  Yes Rita Ohara, MD  metoprolol succinate (TOPROL-XL) 50 MG 24 hr tablet Take 1 tablet (50 mg total) by mouth daily. Take with or immediately following a meal. Patient taking differently: Take 50 mg by mouth daily. 12/24/21  Yes Rita Ohara, MD  Multiple Vitamin (MULTIVITAMIN WITH MINERALS) TABS tablet Take 1 tablet by mouth daily with breakfast. CENTRUM MULTIVITAMIN   Yes [provider]  omeprazole (PRILOSEC) 20 MG capsule Take 20 mg by mouth daily before breakfast.   Yes [provider]  PREVIDENT 5000 BOOSTER PLUS 1.1 % PSTE Place 1  application  onto teeth every 3 (three) days. 10/30/21  Yes [provider]  Probiotic Product (PROBIOTIC PO) Take 1 capsule by mouth daily.   Yes [provider]  Wheat Dextrin (BENEFIBER PO) Take 8-10 g by mouth See admin instructions. Mix 8-10 grams (2 teaspoonsful) of powder into applesauce or the suggested amount of warm water and consume 2 times a day   Yes [provider]  amoxicillin (AMOXIL) 500 MG capsule Take 2,000 mg by mouth See admin instructions. Take 2,000 mg by mouth one hour prior to dental appointments Patient not taking: Reported on 02/03/2022 12/02/21   [provider]  oxyCODONE (OXY IR/ROXICODONE) 5 MG immediate release tablet Take 1 tablet (5 mg total) by mouth every 4 (four) hours as needed for moderate pain. Patient not taking: Reported on 02/03/2022 01/29/22   Saverio Danker, PA-C     Vital Signs: BP (!) 146/79 (BP Location: Left Arm)   Pulse (!) 102   Temp 97.7 F (36.5 C) (Oral)   Resp 20   Ht '4\' 10"'$  (1.473 m)   Wt 118 lb 13.3 oz (53.9 kg)   SpO2 98%   BMI 24.84 kg/m   Physical Exam: awake/alert;  RUQ/hepatic drain intact, insertion site not sig tender, about 10 cc turbid beige fluid in JP; drain flushed without diffculty; output last 24 hrs 35 cc   Imaging: CT ABDOMEN PELVIS W CONTRAST  Result Date: 02/08/2022 CLINICAL DATA:  Cholecystectomy 13 days ago with postoperative fluid collections EXAM: CT ABDOMEN AND PELVIS WITH  CONTRAST TECHNIQUE: Multidetector CT imaging of the abdomen and pelvis was performed using the standard protocol following bolus administration of intravenous contrast. RADIATION DOSE REDUCTION: This exam was performed according to the departmental dose-optimization program which includes automated exposure control, adjustment of the mA and/or kV according to patient size and/or use of iterative reconstruction technique. CONTRAST:  164m OMNIPAQUE IOHEXOL 300 MG/ML  SOLN COMPARISON:  02/03/2022 and the CT-guided  drain placement of 02/04/2022. FINDINGS: Lower chest: Clustered nodularity within the inferior right upper lobe, likely postinfectious or inflammatory. Scarring in the right middle lobe. Small right pleural effusion is similar. Mild cardiomegaly. Hepatobiliary: No focal liver lesion. Cholecystectomy with mild intrahepatic biliary duct dilatation. The common duct measures maximally 1.0 cm in the porta hepatis, similar. Subtle hyperattenuation in the dependent common duct including on 35/4 is likely present on the prior. Surgical drain terminates adjacent the left lobe of the liver, without surrounding fluid. Right sided percutaneous drain with ill-defined surrounding collection of 2.0 x 1.9 cm on 38/4 versus 2.7 x 1.8 cm on the prior diagnostic CT. The collection about the inferior right hepatic lobe measures 4.9 x 1.6 cm on 45/4 versus 5.7 x 2.0 cm on the prior exam. Smaller right perihepatic collection of 1.3 cm on 37/4 is similar 1.1 cm on the prior. Anterior right perihepatic collection measures 4.1 x 1.1 cm on 43/4 versus 5.0 x 1.2 cm previously. Pancreas: Normal pancreas for age. No duct dilatation or acute inflammation. Spleen: Normal in size, without focal abnormality. Adrenals/Urinary Tract: Normal adrenal glands. Bilateral too small to characterize renal lesions are most likely cysts . In the absence of clinically indicated signs/symptoms require(s) no independent follow-up. No hydronephrosis. Degraded evaluation of the pelvis, secondary to beam hardening artifact from right hip arthroplasty. Grossly normal urinary bladder. Stomach/Bowel: Proximal gastric underdistention. Normal stomach, without wall thickening. Resolved right-sided colonic wall thickening. Normal terminal ileum and appendix. Normal small bowel. Vascular/Lymphatic: Advanced aortic and branch vessel atherosclerosis. No abdominopelvic adenopathy. Reproductive: Normal uterus. Prominent gonadal veins can be seen with pelvic congestion syndrome.  No adnexal mass. Other: No significant free fluid. Musculoskeletal: Right hip arthroplasty. Convex left lumbar spine curvature. IMPRESSION: 1. Placement of right upper quadrant percutaneous drain. Mild decrease in size of the majority of the perihepatic collections as detailed above. A right perihepatic 1.3 cm collection is similar to on the prior exam. 2. Mild intrahepatic biliary duct dilatation. Possible dependent common duct stones or sludge. If bilirubin is elevated, consider MRCP. 3. Small right pleural effusion 4. Resolved right-sided colitis 5.  Aortic Atherosclerosis (ICD10-I70.0). 6. Degraded evaluation of the pelvis, secondary to beam hardening artifact from right hip arthroplasty. Electronically Signed   By: KAbigail MiyamotoM.D.   On: 02/08/2022 14:20    Labs:  CBC: Recent Labs    02/07/22 0851 02/08/22 0619 02/09/22 0606 02/10/22 0533  WBC 15.2* 15.2* 15.5* 13.2*  HGB 11.8* 11.3* 12.0 11.0*  HCT 36.9 35.7* 38.3 34.3*  PLT 411* 434* 502* 437*    COAGS: Recent Labs    02/04/22 0934  INR 1.2    BMP: Recent Labs    02/04/22 0649 02/06/22 0500 02/07/22 0851 02/08/22 0619  NA 139 138 137 136  K 3.5 3.3* 4.2 4.4  CL 104 104 107 106  CO2 '25 28 25 22  '$ GLUCOSE 69* 105* 123* 76  BUN 7* '10 9 8  '$ CALCIUM 7.9* 7.7* 8.2* 8.3*  CREATININE 0.54 0.63 0.56 0.55  GFRNONAA >60 >60 >60 >60    LIVER FUNCTION  TESTS: Recent Labs    01/28/22 0428 02/03/22 1102 02/04/22 0649 02/08/22 0619  BILITOT 0.6 0.8 1.1 0.4  AST 62* 31 25 51*  ALT 50* 34 26 31  ALKPHOS 90 89 70 85  PROT 5.4* 5.8* 5.0* 5.8*  ALBUMIN 2.3* 2.3* 1.9* 2.4*    Assessment and Plan: Pt s/p laparoscopic cholecystectomy with JP drain placement on 01/27/22 for acute gangrenous cholecystitis; now s/p hepatic abscess drain placement 11/8 (10 fr to JP); afebrile ; liver absc cx- e coli/klebsiella ; WBC 13.2(15.5), hgb stable; CT A/P 11/12 :    1. Placement of right upper quadrant percutaneous drain. Mild decrease  in size of the majority of the perihepatic collections as detailed above. A right perihepatic 1.3 cm collection is similar to on the prior exam. 2. Mild intrahepatic biliary duct dilatation. Possible dependent common duct stones or sludge. If bilirubin is elevated, consider MRCP. 3. Small right pleural effusion 4. Resolved right-sided colitis 5.  Aortic Atherosclerosis (ICD10-I70.0). 6. Degraded evaluation of the pelvis, secondary to beam hardening artifact from right hip arthroplasty.   Images were reviewed by Dr. Laurence Ferrari; no change in plans recommended at this time; continue current drain; cont drain irrigation; once outpatient then flush drain once daily with 5 cc sterile saline, record output and change dressing every 2-3 days; we will arrange for f/u CT at our IR clinic after dc date finalized; drain flush technique reviewed with pt/son   Electronically Signed: D. Rowe Robert, PA-C 02/10/2022, 3:07 PM   I spent a total of 15 Minutes at the the patient's bedside AND on the patient's hospital floor or unit, greater than 50% of which was counseling/coordinating care for hepatic abscess drain    Patient ID: Alexis Hines, female   DOB: January 24, 1934, 88 y.o.   MRN: [id number]

## 2022-02-10 NOTE — Progress Notes (Signed)
PHARMACY CONSULT NOTE FOR:  OUTPATIENT  PARENTERAL ANTIBIOTIC THERAPY (OPAT)  Indication: Polymicrobial liver abscess Regimen: Rocephin 2g IV every 24 hours and Flagyl 500 mg po every 12 hours End date: 03/10/22  IV antibiotic discharge orders are pended. To discharging provider:  please sign these orders via discharge navigator,  Select New Orders & click on the button choice - Manage This Unsigned Work.    Thank you for allowing pharmacy to be a part of this patient's care.  Alycia Rossetti, PharmD, BCPS Infectious Diseases Clinical Pharmacist 02/10/2022 12:53 PM   **Pharmacist phone directory can now be found on [email or web address] ([email or web address]).  Listed under [place].

## 2022-02-11 DIAGNOSIS — I5032 Chronic diastolic (congestive) heart failure: Secondary | ICD-10-CM | POA: Diagnosis present

## 2022-02-11 DIAGNOSIS — E43 Unspecified severe protein-calorie malnutrition: Secondary | ICD-10-CM

## 2022-02-11 DIAGNOSIS — E782 Mixed hyperlipidemia: Secondary | ICD-10-CM | POA: Diagnosis present

## 2022-02-11 LAB — CBC WITH DIFFERENTIAL/PLATELET
Abs Immature Granulocytes: 0.07 10*3/uL (ref 0.00–0.07)
Basophils Absolute: 0.1 10*3/uL (ref 0.0–0.1)
Basophils Relative: 1 %
Eosinophils Absolute: 0.3 10*3/uL (ref 0.0–0.5)
Eosinophils Relative: 2 %
HCT: 35.7 % — ABNORMAL LOW (ref 36.0–46.0)
Hemoglobin: 11.2 g/dL — ABNORMAL LOW (ref 12.0–15.0)
Immature Granulocytes: 1 %
Lymphocytes Relative: 14 %
Lymphs Abs: 1.6 10*3/uL (ref 0.7–4.0)
MCH: 29.2 pg (ref 26.0–34.0)
MCHC: 31.4 g/dL (ref 30.0–36.0)
MCV: 93 fL (ref 80.0–100.0)
Monocytes Absolute: 1.4 10*3/uL — ABNORMAL HIGH (ref 0.1–1.0)
Monocytes Relative: 12 %
Neutro Abs: 8.1 10*3/uL — ABNORMAL HIGH (ref 1.7–7.7)
Neutrophils Relative %: 70 %
Platelets: 453 10*3/uL — ABNORMAL HIGH (ref 150–400)
RBC: 3.84 MIL/uL — ABNORMAL LOW (ref 3.87–5.11)
RDW: 15 % (ref 11.5–15.5)
WBC: 11.5 10*3/uL — ABNORMAL HIGH (ref 4.0–10.5)
nRBC: 0 % (ref 0.0–0.2)

## 2022-02-11 MED ORDER — SODIUM CHLORIDE 0.9% FLUSH: 10.0000 mL | Freq: Two times a day (BID) | INTRAVENOUS | Status: DC

## 2022-02-11 MED ORDER — CEFTRIAXONE IV (FOR PTA / DISCHARGE USE ONLY): 2.0000 g | INTRAVENOUS | 0 refills | Status: DC

## 2022-02-11 MED ORDER — OXYCODONE HCL 5 MG PO TABS: 5.0000 mg | ORAL_TABLET | ORAL | 0 refills | Status: AC | PRN

## 2022-02-11 MED ORDER — ONDANSETRON HCL 4 MG PO TABS: 4.0000 mg | ORAL_TABLET | Freq: Four times a day (QID) | ORAL | 0 refills | Status: DC | PRN

## 2022-02-11 MED ORDER — CHLORHEXIDINE GLUCONATE CLOTH 2 % EX PADS: 6.0000 | MEDICATED_PAD | Freq: Every day | CUTANEOUS | Status: DC

## 2022-02-11 MED ORDER — METRONIDAZOLE 500 MG PO TABS: 500.0000 mg | ORAL_TABLET | Freq: Two times a day (BID) | ORAL | 0 refills | Status: DC

## 2022-02-11 MED ORDER — SODIUM CHLORIDE 0.9% FLUSH: 10.0000 mL | INTRAVENOUS | Status: DC | PRN

## 2022-02-11 NOTE — Progress Notes (Signed)
Progress Note     Subjective: Pt remains anxious about discharge home but understands she is nearing medical readiness. No abdominal pain this AM. Tolerating PO but still low appetite.  Objective: Vital signs in last 24 hours: Temp:  [97.6 F (36.4 C)-99 F (37.2 C)] 98.4 F (36.9 C) (11/15 1000) Pulse Rate:  [92-102] 98 (11/15 1000) Resp:  [18-20] 19 (11/15 1000) BP: (126-146)/(54-79) 126/55 (11/15 1000) SpO2:  [93 %-98 %] 93 % (11/15 1000) Weight:  [57.9 kg] 57.9 kg (11/15 0544) Last BM Date : 02/10/22  Intake/Output from previous day: 11/14 0701 - 11/15 0700 In: 725 [P.O.:720] Out: 0  Intake/Output this shift: Total I/O In: 125 [P.O.:120; Other:5] Out: 0   PE: General: pleasant, WD, elderly female who is laying in bed in NAD HEENT: sclera anicteric  Heart: regular, rate, and rhythm.   Lungs: Respiratory effort nonlabored Abd: soft, NT, ND, IR drain with small amount fluid, incisions C/D/I Psych: A&Ox3 with an anxious affect.    Lab Results:  Recent Labs    02/10/22 0533 02/11/22 0550  WBC 13.2* 11.5*  HGB 11.0* 11.2*  HCT 34.3* 35.7*  PLT 437* 453*    BMET No results for input(s): "NA", "K", "CL", "CO2", "GLUCOSE", "BUN", "CREATININE", "CALCIUM" in the last 72 hours.  PT/INR No results for input(s): "LABPROT", "INR" in the last 72 hours. CMP     Component Value Date/Time   NA 136 02/08/2022 0619   NA 135 01/26/2022 1202   K 4.4 02/08/2022 0619   CL 106 02/08/2022 0619   CO2 22 02/08/2022 0619   GLUCOSE 76 02/08/2022 0619   BUN 8 02/08/2022 0619   BUN 11 01/26/2022 1202   CREATININE 0.55 02/08/2022 0619   CALCIUM 8.3 (L) 02/08/2022 0619   PROT 5.8 (L) 02/08/2022 0619   PROT 5.8 (L) 01/26/2022 1202   ALBUMIN 2.4 (L) 02/08/2022 0619   ALBUMIN 3.5 (L) 01/26/2022 1202   AST 51 (H) 02/08/2022 0619   ALT 31 02/08/2022 0619   ALKPHOS 85 02/08/2022 0619   BILITOT 0.4 02/08/2022 0619   BILITOT 0.9 01/26/2022 1202   GFRNONAA >60 02/08/2022 0619    GFRAA 78 11/09/2019 0947   Lipase     Component Value Date/Time   LIPASE 25 02/03/2022 1102       Studies/Results: [place] EKG SITE RITE  Result Date: 02/10/2022 If Site Rite image not attached, placement could not be confirmed due to current cardiac rhythm.   Anti-infectives: Anti-infectives (From admission, onward)    Start     Dose/Rate Route Frequency Ordered Stop   02/06/22 1045  cefTRIAXone (ROCEPHIN) 2 g in sodium chloride 0.9 % 100 mL IVPB        2 g 200 mL/hr over 30 Minutes Intravenous Daily 02/06/22 0945     02/06/22 1045  metroNIDAZOLE (FLAGYL) tablet 500 mg        500 mg Oral Every 12 hours 02/06/22 0945     02/03/22 1800  piperacillin-tazobactam (ZOSYN) IVPB 3.375 g  Status:  Discontinued        3.375 g 12.5 mL/hr over 240 Minutes Intravenous Every 8 hours 02/03/22 1519 02/06/22 0945   02/03/22 1515  piperacillin-tazobactam (ZOSYN) IVPB 3.375 g  Status:  Discontinued        3.375 g 100 mL/hr over 30 Minutes Intravenous Every 8 hours 02/03/22 1513 02/03/22 1519   02/03/22 1130  piperacillin-tazobactam (ZOSYN) IVPB 3.375 g        3.375 g 100 mL/hr  over 30 Minutes Intravenous  Once 02/03/22 1127 02/03/22 1231        Assessment/Plan  Abdominal pain, leukocytosis POD 15 status post laparoscopic cholecystectomy with JP drain placement 10/31 Dr. Thermon Leyland for gangrenous cholecystitis with intra-op finding of liver abscess - WBC 13 from 15, afebrile. ID following and rocephin/flagyl - intra-op culture of liver abscess 10/31 with e coli and klebsiella with resistance to ampicillin. Cx from IR drain placement 11/8 with the same  - CT 11/7 with additional fluid collections noted - s/p IR drain 11/8  - CT 11/12 with resolution in fluid around surgical drain, slight improvement in other collections - IR does not recommend any repositioning of drain or further drainage - patient is stable for discharge from a surgical perspective with drain clinic follow up and follow  up with surgeon - Planning PICC placement and home IV abx per ID - surgery will follow peripherally at this time but we are available as needed for questions    Diarrhea - prn imodium, metamucil per TRH. Added probiotic 11/13 per family request    FEN: regular, boost  ID: [email or web address]. rocephin/flagyl 11/10>> VTE: LMWH Dispo: PICC placement and arrangement of HH   LOS: 8 days    Norm Parcel, [facility] Surgery 02/11/2022, 10:17 AM Please see Amion for pager number during day hours 7:00am-4:30pm

## 2022-02-11 NOTE — Discharge Summary (Signed)
Physician Discharge Summary   Patient: Alexis Hines MRN: [id number] DOB: June 27, 1933  Admit date:     02/03/2022  Discharge date: 02/11/22  Discharge Physician: Vernelle Emerald   PCP: Rita Ohara, MD   Recommendations at discharge:   Patient to maintain all follow-up appointments including following up with Dr. Thermon Leyland with general surgery, Dr. Juleen China with infectious disease, interventional radiology and the patient's primary care provider Patient is to flush and maintain her JP drain as instructed Patient is to continue to receive ongoing outpatient intravenous ceftriaxone  via PICC line in addition to oral Flagyl  Discharge Diagnoses: Principal Problem:   Liver abscess Active Problems:   Postoperative infection, initial encounter   Gangrenous cholecystitis   Mixed hyperlipidemia   Chronic diastolic (congestive) heart failure ([place])   Protein-calorie malnutrition, severe (HCC)   Paroxysmal SVT (supraventricular tachycardia)   Normocytic anemia  Resolved Problems:   * No resolved hospital problems. *   Hospital Course: 88 y.o. female with a history of osteoarthritis, basal cell cancer of the nose, diverticulosis, migraine headaches, paroxysmal SVT, grade 1 diastolic dysfunction who presented secondary to decreased JP drain output and worsening abdominal pain.    Patient was recently admitted and discharged due to acute gangrenous cholecystitis treated with a laparoscopic cholecystectomy with JP drain placement on 10/31 by Dr. Thermon Leyland.  Intraoperative cultures at that time grew Klebsiella pneumoniae and E. coli.  Patient was discharged home on Augmentin at that point however unfortunately Klebsiella happened to be resistant to this regimen.  Upon presentation to the [facility] emergency department CT imaging of the abdomen and pelvis showed multiple air fluid collections in the gallbladder fossa and throughout the right lobe of the liver concerning for postoperative  infection/abscess formation.  The hospitalist group was then called to assess the patient for admission the hospital.  Upon admission, general surgery was once again consulted.  Patient was initially placed on intravenous Zosyn.  Interventional radiology was also consulted.  Surgical JP drain was removed with IR guided drain placement on 11/8 into a hepatic abscess.  This culture once again grew out Klebsiella pneumoniae and E. coli.  Patient clinically improved and arrangements were made for patient to receive a PICC line which was placed on 11/15.  Under the direction of infectious disease, patient was to be discharged home on an ongoing regimen of intravenous Rocephin and oral Flagyl to complete a total of 4 weeks of treatment with an end date on 12/12.  At that point, arrangements for home health antibiotics were arranged and patient was discharged home on 11/15 in improved and stable condition.    Pain control - [employer] Controlled Substance Reporting System database was reviewed. and patient was instructed, not to drive, operate heavy machinery, perform activities at heights, swimming or participation in water activities or provide baby-sitting services while on Pain, Sleep and Anxiety Medications; until their outpatient Physician has advised to do so again. Also recommended to not to take more than prescribed Pain, Sleep and Anxiety Medications.   Consultants: Dr. Thermon Leyland with general surgery.  Dr. Juleen China with infectious disease.  Dr. Serafina Royals with Interventional radiology. Procedures performed: IR guided drain placement on 11/8.  PICC line placement on 11/15. Disposition: Home health Diet recommendation:  Discharge Diet Orders (From admission, onward)     Start     Ordered   02/11/22 0000  Diet - low sodium heart healthy        02/11/22 1302  Cardiac diet  DISCHARGE MEDICATION: Allergies as of 02/11/2022   No Known Allergies      Medication List     STOP  taking these medications    amoxicillin 500 MG capsule Commonly known as: AMOXIL   amoxicillin-clavulanate 875-125 MG tablet Commonly known as: AUGMENTIN       TAKE these medications    acetaminophen 500 MG tablet Commonly known as: TYLENOL Take 2 tablets (1,000 mg total) by mouth every 6 (six) hours as needed. What changed: reasons to take this   Advil 200 MG Caps Generic drug: Ibuprofen Take 200 mg by mouth every 6 (six) hours as needed (for headaches).   atorvastatin 40 MG tablet Commonly known as: LIPITOR TAKE 1 TABLET BY MOUTH AT  BEDTIME   BENEFIBER PO Take 8-10 g by mouth See admin instructions. Mix 8-10 grams (2 teaspoonsful) of powder into applesauce or the suggested amount of warm water and consume 2 times a day   Biotin 10000 MCG Tabs Take 10,000 mcg by mouth in the morning.   CALTRATE 600+D PO Take 1 tablet by mouth daily.   cefTRIAXone  IVPB Commonly known as: ROCEPHIN Inject 2 g into the vein daily for 28 days. Indication:  Liver abscess First Dose: Yes Last Day of Therapy:  03/10/22 Labs - Once weekly:  CBC/D and BMP, Labs - Every other week:  ESR and CRP Method of administration: IV Push Method of administration may be changed at the discretion of home infusion pharmacist based upon assessment of the patient and/or caregiver's ability to self-administer the medication ordered.   ipratropium 0.06 % nasal spray Commonly known as: ATROVENT USE 2 SPRAYS IN EACH NOSTRIL 3 TO 4 TIMES DAILY FOR RUNNY NOSE What changed:  how much to take how to take this when to take this additional instructions   metoprolol succinate 50 MG 24 hr tablet Commonly known as: TOPROL-XL Take 1 tablet (50 mg total) by mouth daily. Take with or immediately following a meal. What changed: additional instructions   metroNIDAZOLE 500 MG tablet Commonly known as: Flagyl Take 1 tablet (500 mg total) by mouth 2 (two) times daily for 28 days.   multivitamin with minerals Tabs  tablet Take 1 tablet by mouth daily with breakfast. CENTRUM MULTIVITAMIN   omeprazole 20 MG capsule Commonly known as: PRILOSEC Take 20 mg by mouth daily before breakfast.   ondansetron 4 MG tablet Commonly known as: ZOFRAN Take 1 tablet (4 mg total) by mouth every 6 (six) hours as needed for nausea or vomiting.   oxyCODONE 5 MG immediate release tablet Commonly known as: Oxy IR/ROXICODONE Take 1 tablet (5 mg total) by mouth every 4 (four) hours as needed for up to 3 days for moderate pain or severe pain. What changed: reasons to take this   PreviDent 5000 Booster Plus 1.1 % Pste Generic drug: Sodium Fluoride Place 1 application  onto teeth every 3 (three) days.   PROBIOTIC PO Take 1 capsule by mouth daily.               Discharge Care Instructions  (From admission, onward)           Start     Ordered   02/11/22 0000  Change dressing on IV access line weekly and PRN  (Home infusion instructions - [facility] )        02/11/22 1302   02/11/22 0000  Change dressing (specify)       Comments: Drain Dressing change: once  times per day using dry sterile dressing when soiled.   02/11/22 1302            Follow-up Information     Stechschulte, Nickola Major, MD. Go on 02/26/2022.   Specialty: Surgery Why: 12 PM. Please arrive 30 min prior to appointment time. Have ID and insurance information with you. Contact information: [address] [address] [phone]         Rita Ohara, MD. Schedule an appointment as soon as possible for a visit in 1 week(s).   Specialty: Family Medicine Contact information: [place] [address] [place], Chewey, DO. Go on 03/02/2022.   Specialties: Infectious Diseases, Internal Medicine Why: 4PM Contact information: [address] [place] [address] [phone]         Interventional Radiology Follow up.   Why: You will  be contracted with a follow up appointment within 1 week of discharge.                Discharge Exam: Filed Weights   02/03/22 0942 02/10/22 0600 02/11/22 0544  Weight: 56.7 kg 53.9 kg 57.9 kg    Constitutional: Awake alert and oriented x3, no associated distress.   Respiratory: clear to auscultation bilaterally, no wheezing, no crackles. Normal respiratory effort. No accessory muscle use.  Cardiovascular: Regular rate and rhythm, no murmurs / rubs / gallops. No extremity edema. 2+ pedal pulses. No carotid bruits.  Abdomen: JP drain in place draining serosanguineous drainage.  Mild surrounding abdominal tenderness.  Abdomen is soft.   No evidence of intra-abdominal masses.  Positive bowel sounds noted in all quadrants.   Musculoskeletal: No joint deformity upper and lower extremities. Good ROM, no contractures. Normal muscle tone.     Condition at discharge: fair  The results of significant diagnostics from this hospitalization (including imaging, microbiology, ancillary and laboratory) are listed below for reference.   Imaging Studies: [place] EKG SITE RITE  Result Date: 02/10/2022 If Site Rite image not attached, placement could not be confirmed due to current cardiac rhythm.  CT ABDOMEN PELVIS W CONTRAST  Result Date: 02/08/2022 CLINICAL DATA:  Cholecystectomy 13 days ago with postoperative fluid collections EXAM: CT ABDOMEN AND PELVIS WITH CONTRAST TECHNIQUE: Multidetector CT imaging of the abdomen and pelvis was performed using the standard protocol following bolus administration of intravenous contrast. RADIATION DOSE REDUCTION: This exam was performed according to the departmental dose-optimization program which includes automated exposure control, adjustment of the mA and/or kV according to patient size and/or use of iterative reconstruction technique. CONTRAST:  136m OMNIPAQUE IOHEXOL 300 MG/ML  SOLN COMPARISON:  02/03/2022 and the CT-guided drain placement of 02/04/2022.  FINDINGS: Lower chest: Clustered nodularity within the inferior right upper lobe, likely postinfectious or inflammatory. Scarring in the right middle lobe. Small right pleural effusion is similar. Mild cardiomegaly. Hepatobiliary: No focal liver lesion. Cholecystectomy with mild intrahepatic biliary duct dilatation. The common duct measures maximally 1.0 cm in the porta hepatis, similar. Subtle hyperattenuation in the dependent common duct including on 35/4 is likely present on the prior. Surgical drain terminates adjacent the left lobe of the liver, without surrounding fluid. Right sided percutaneous drain with ill-defined surrounding collection of 2.0 x 1.9 cm on 38/4 versus 2.7 x 1.8 cm on the prior diagnostic CT. The collection about the inferior right hepatic lobe measures 4.9 x 1.6 cm on 45/4 versus 5.7 x 2.0 cm on the prior  exam. Smaller right perihepatic collection of 1.3 cm on 37/4 is similar 1.1 cm on the prior. Anterior right perihepatic collection measures 4.1 x 1.1 cm on 43/4 versus 5.0 x 1.2 cm previously. Pancreas: Normal pancreas for age. No duct dilatation or acute inflammation. Spleen: Normal in size, without focal abnormality. Adrenals/Urinary Tract: Normal adrenal glands. Bilateral too small to characterize renal lesions are most likely cysts . In the absence of clinically indicated signs/symptoms require(s) no independent follow-up. No hydronephrosis. Degraded evaluation of the pelvis, secondary to beam hardening artifact from right hip arthroplasty. Grossly normal urinary bladder. Stomach/Bowel: Proximal gastric underdistention. Normal stomach, without wall thickening. Resolved right-sided colonic wall thickening. Normal terminal ileum and appendix. Normal small bowel. Vascular/Lymphatic: Advanced aortic and branch vessel atherosclerosis. No abdominopelvic adenopathy. Reproductive: Normal uterus. Prominent gonadal veins can be seen with pelvic congestion syndrome. No adnexal mass. Other: No  significant free fluid. Musculoskeletal: Right hip arthroplasty. Convex left lumbar spine curvature. IMPRESSION: 1. Placement of right upper quadrant percutaneous drain. Mild decrease in size of the majority of the perihepatic collections as detailed above. A right perihepatic 1.3 cm collection is similar to on the prior exam. 2. Mild intrahepatic biliary duct dilatation. Possible dependent common duct stones or sludge. If bilirubin is elevated, consider MRCP. 3. Small right pleural effusion 4. Resolved right-sided colitis 5.  Aortic Atherosclerosis (ICD10-I70.0). 6. Degraded evaluation of the pelvis, secondary to beam hardening artifact from right hip arthroplasty. Electronically Signed   By: Abigail Miyamoto M.D.   On: 02/08/2022 14:20   DG CHEST PORT 1 VIEW  Result Date: 02/04/2022 CLINICAL DATA:  Physical exam abnormality EXAM: PORTABLE CHEST 1 VIEW COMPARISON:  10/24/2013 FINDINGS: Left shoulder arthroplasty. Numerous leads and wires project over the chest. Midline trachea. Moderate cardiomegaly. Atherosclerosis in the transverse aorta. No pleural effusion or pneumothorax. No congestive failure. Clear right lung. The left hemidiaphragm is partially obscured. IMPRESSION: Left hemidiaphragm obscuration, suggesting left lower lobe airspace disease. This could represent atelectasis or infection. Consider radiographic follow-up to confirm resolution. Mild cardiomegaly, without congestive failure. Aortic Atherosclerosis (ICD10-I70.0). Electronically Signed   By: Abigail Miyamoto M.D.   On: 02/04/2022 14:18   CT GUIDED VISCERAL FLUID DRAIN BY PERC CATH  Result Date: 02/04/2022 INDICATION: 88 year old female status post recent cholecystectomy with subcapsular [place] fluid collection concerning for abscess. EXAM: CT IMAGE GUIDED DRAINAGE BY PERCUTANEOUS CATHETER COMPARISON:  02/03/2022 MEDICATIONS: The patient is currently admitted to the hospital and receiving intravenous antibiotics. The antibiotics were administered  within an appropriate time frame prior to the initiation of the procedure. ANESTHESIA/SEDATION: Moderate (conscious) sedation was employed during this procedure. A total of Versed 2 mg and Fentanyl 100 mcg was administered intravenously. Moderate Sedation Time: 21 minutes. The patient's level of consciousness and vital signs were monitored continuously by radiology nursing throughout the procedure under my direct supervision. CONTRAST:  None COMPLICATIONS: None immediate. PROCEDURE: RADIATION DOSE REDUCTION: This exam was performed according to the departmental dose-optimization program which includes automated exposure control, adjustment of the mA and/or kV according to patient size and/or use of iterative reconstruction technique. Informed written consent was obtained from the patient after a discussion of the risks, benefits and alternatives to treatment. The patient was placed supine on the CT gantry and a pre procedural CT was performed re-demonstrating the known abscess/fluid collection within the subcapsular right lobe of the liver. The procedure was planned. A timeout was performed prior to the initiation of the procedure. The right upper quadrant was prepped and draped in the  usual sterile fashion. The overlying soft tissues were anesthetized with 1% lidocaine with epinephrine. Appropriate trajectory was planned with the use of a 22 gauge spinal needle. An 18 gauge trocar needle was advanced into the abscess/fluid collection and a short Amplatz super stiff wire was coiled within the collection. Appropriate positioning was confirmed with a limited CT scan. The tract was serially dilated allowing placement of a 10.2 [place] all-purpose drainage catheter. Appropriate positioning was confirmed with a limited postprocedural CT scan. Approximately 10 ml of sanguinopurulent fluid was aspirated. The tube was connected to a bulb suction and sutured in place. A dressing was placed. The patient tolerated the procedure  well without immediate post procedural complication. IMPRESSION: Successful CT guided placement of a 10.2 [place] all purpose drain catheter into the right subcapsular hepatic abscess with aspiration of 10 mL of purulent fluid. Samples were sent to the laboratory as requested by the ordering clinical team. Ruthann Cancer, MD [facility]iology Electronically Signed   By: Ruthann Cancer M.D.   On: 02/04/2022 12:19   CT Abdomen Pelvis W Contrast  Result Date: 02/03/2022 CLINICAL DATA:  Postoperative abdominal pain, status post cholecystectomy, persistent pain, no drainage from drainage catheter EXAM: CT ABDOMEN AND PELVIS WITH CONTRAST TECHNIQUE: Multidetector CT imaging of the abdomen and pelvis was performed using the standard protocol following bolus administration of intravenous contrast. RADIATION DOSE REDUCTION: This exam was performed according to the departmental dose-optimization program which includes automated exposure control, adjustment of the mA and/or kV according to patient size and/or use of iterative reconstruction technique. CONTRAST:  129m OMNIPAQUE IOHEXOL 300 MG/ML SOLN, additional oral enteric contrast COMPARISON:  01/27/2022 FINDINGS: Lower chest: Small right, trace left pleural effusions with associated atelectasis or consolidation. Cardiomegaly and coronary artery calcifications. Aortic valve calcifications. Hepatobiliary: No focal liver abnormality is seen. Status post cholecystectomy. Intra and extrahepatic biliary ductal dilatation, the common bile duct measuring up to 1.0 cm (series 5, image 53). Surgical drain is positioned in the gallbladder fossa with a small air and fluid collection in the gallbladder fossa measuring 2.7 x 1.8 cm (series 2, image 31). Additional small fluid collections about the right lobe of the liver, laterally measuring 5.7 x 2.0 cm (series 2, image 36) and anteriorly measuring 5.0 x 1.2 cm (series 2, image 36).  Pancreas: Unremarkable. No pancreatic ductal dilatation or surrounding inflammatory changes. Spleen: Normal in size without significant abnormality. Adrenals/Urinary Tract: Adrenal glands are unremarkable. Kidneys are normal, without renal calculi, solid lesion, or hydronephrosis. Bladder is unremarkable. Stomach/Bowel: Stomach is within normal limits. Appendix appears normal. Wall thickening of the hepatic flexure and transverse colon in the right upper quadrant (series 2, image 38). Vascular/Lymphatic: Aortic atherosclerosis. No enlarged abdominal or pelvic lymph nodes. Reproductive: No mass or other significant abnormality. Other: No abdominal wall hernia or abnormality. No ascites. Musculoskeletal: No acute or significant osseous findings. IMPRESSION: 1. Status post cholecystectomy. Surgical drain is positioned in the gallbladder fossa. 2. Multiple air and fluid collections in the gallbladder fossa and about the right lobe of the liver, concerning for postoperative infection. The presence or absence of infection within these collections is however not established by CT. 3. Intra and extrahepatic biliary ductal dilatation, the common bile duct measuring up to 1.0 cm. 4. Wall thickening of the hepatic flexure and transverse colon in the right upper quadrant, likely reactive to adjacent inflammation. 5. Small right, trace left pleural effusions with associated atelectasis or consolidation. 6. Cardiomegaly and coronary artery disease. Aortic  Atherosclerosis (ICD10-I70.0). Electronically Signed   By: Delanna Ahmadi M.D.   On: 02/03/2022 13:23   CT Abdomen Pelvis Wo Contrast  Result Date: 01/27/2022 CLINICAL DATA:  Epigastric pain.  Fever. EXAM: CT ABDOMEN AND PELVIS WITHOUT CONTRAST TECHNIQUE: Multidetector CT imaging of the abdomen and pelvis was performed following the standard protocol without IV contrast. RADIATION DOSE REDUCTION: This exam was performed according to the departmental dose-optimization program  which includes automated exposure control, adjustment of the mA and/or kV according to patient size and/or use of iterative reconstruction technique. COMPARISON:  None Available. FINDINGS: Lower chest: Bibasilar atelectasis.  Enlarged cardiac contours. Hepatobiliary: Liver has a normal contour. There is mild periportal edema. No definite evidence of intrahepatic biliary ductal dilatation. Lack of IV contrast markedly limits the ability to assess for focal liver lesions. The gallbladder is markedly enlarged with multiple layering gallstones. There is also evidence of pericholecystic fat stranding (series 5, image 42) there is marked gallbladder wall thickening with air within the gallbladder wall. There also appears to be a defect in the wall of the gallbladder (series 2, image 35). Findings are worrisome for emphysematous and possibly gangrenous cholecystitis. Common bile duct is normal in size measuring up to 6 mm. Pancreas: Unremarkable. No pancreatic ductal dilatation or surrounding inflammatory changes. Spleen: Normal in size without focal abnormality. Adrenals/Urinary Tract: Adrenal glands are unremarkable. Kidneys are normal, without renal calculi, focal lesion, or hydronephrosis. Bladder is unremarkable. Stomach/Bowel: Stomach, small bowel, large bowel are normal in caliber. There is no evidence of bowel obstruction. There is no evidence of focal wall thickening. Vascular/Lymphatic: Severe atherosclerotic plaque is seen in the abdominal and pelvic vasculature. Reproductive: Uterus and bilateral adnexa are unremarkable. Other: No abdominal wall hernia or abnormality. No abdominopelvic ascites. Musculoskeletal: No acute or significant osseous findings. Right hip arthroplasty. IMPRESSION: Findings are worrisome for emphysematous and gangrenous cholecystitis. Recommend surgical consultation. Findings were discussed with Dr. Redmond School on 01/27/22 at 11:00 AM. Electronically Signed   By: Marin Roberts M.D.   On:  01/27/2022 11:05    Microbiology: Results for orders placed or performed during the hospital encounter of 02/03/22  Aerobic/Anaerobic Culture w Gram Stain (surgical/deep wound)     Status: None   Collection Time: 02/04/22 11:35 AM   Specimen: Liver; Abscess  Result Value Ref Range Status   Specimen Description   Final    LIVER ABSCESS Performed at [facility] [address], [place], [place] [address]    Special Requests   Final    NONE Performed at [facility], [place] [address] [place], [place] [address]    Gram Stain   Final    ABUNDANT WBC PRESENT, PREDOMINANTLY PMN NO ORGANISMS SEEN    Culture   Final    FEW ESCHERICHIA COLI FEW KLEBSIELLA PNEUMONIAE NO ANAEROBES ISOLATED Performed at [facility], [place] [address] [place], [place] [address]    Report Status 02/09/2022 FINAL  Final   Organism ID, Bacteria ESCHERICHIA COLI  Final   Organism ID, Bacteria KLEBSIELLA PNEUMONIAE  Final      Susceptibility   Escherichia coli - MIC*    AMPICILLIN 16 INTERMEDIATE Intermediate     CEFAZOLIN <=4 SENSITIVE Sensitive     CEFEPIME <=0.12 SENSITIVE Sensitive     CEFTAZIDIME <=1 SENSITIVE Sensitive     CEFTRIAXONE <=0.25 SENSITIVE Sensitive     CIPROFLOXACIN <=0.25 SENSITIVE Sensitive     GENTAMICIN <=1 SENSITIVE Sensitive     IMIPENEM <=0.25 SENSITIVE Sensitive  TRIMETH/SULFA <=20 SENSITIVE Sensitive     AMPICILLIN/SULBACTAM 4 SENSITIVE Sensitive     PIP/TAZO <=4 SENSITIVE Sensitive     * FEW ESCHERICHIA COLI   Klebsiella pneumoniae - MIC*    AMPICILLIN >=32 RESISTANT Resistant     CEFAZOLIN >=64 RESISTANT Resistant     CEFEPIME <=0.12 SENSITIVE Sensitive     CEFTAZIDIME <=1 SENSITIVE Sensitive     CEFTRIAXONE <=0.25 SENSITIVE Sensitive     CIPROFLOXACIN <=0.25 SENSITIVE Sensitive     GENTAMICIN <=1 SENSITIVE Sensitive     IMIPENEM 0.5 SENSITIVE Sensitive     TRIMETH/SULFA <=20 SENSITIVE Sensitive     AMPICILLIN/SULBACTAM 16  INTERMEDIATE Intermediate     PIP/TAZO <=4 SENSITIVE Sensitive     * FEW KLEBSIELLA PNEUMONIAE    Labs: CBC: Recent Labs  Lab 02/07/22 0851 02/08/22 0619 02/09/22 0606 02/10/22 0533 02/11/22 0550  WBC 15.2* 15.2* 15.5* 13.2* 11.5*  NEUTROABS  --  11.8* 11.5* 9.5* 8.1*  HGB 11.8* 11.3* 12.0 11.0* 11.2*  HCT 36.9 35.7* 38.3 34.3* 35.7*  MCV 92.7 91.8 93.6 92.2 93.0  PLT 411* 434* 502* 437* 412*   Basic Metabolic Panel: Recent Labs  Lab 02/06/22 0500 02/07/22 0851 02/08/22 0619  NA 138 137 136  K 3.3* 4.2 4.4  CL 104 107 106  CO2 _0 GLUCOSE 105* 123* 76  BUN _1 CREATININE 0.63 0.56 0.55  CALCIUM 7.7* 8.2* 8.3*   Liver Function Tests: Recent Labs  Lab 02/08/22 0619  AST 51*  ALT 31  ALKPHOS 85  BILITOT 0.4  PROT 5.8*  ALBUMIN 2.4*   CBG: No results for input(s): "GLUCAP" in the last 168 hours.  Discharge time spent: greater than 30 minutes.  Signed: Vernelle [place], MD [facility] 02/11/2022

## 2022-02-11 NOTE — TOC Progression Note (Signed)
Transition of Care [facility]) - Progression Note    Patient Details  Name: AYLA DUNIGAN MRN: [id number] Date of Birth: 1933/06/10  Transition of Care [facility]) CM/SW [employer], RN Phone [phone]  02/11/2022, 2:43 PM  Clinical Narrative:    Jeannene Patella with [employer] on unit and has completed teaching with patient and church member  Jiles Prows [phone]) Jeannene Patella states that church member has agreed to administer home infusions and has completed teaching. [facility] will follow for [facility] RN. Pam has spoken with daughter Beverlee Nims)  and daughter confirms that she will have her mother transported home via [place].  Currently patient is awaiting PICC placement.  Once PICC is placed patient is ready from [facility] standpoint. Teaching complete and [place] set up.         Expected Discharge Plan and Services           Expected Discharge Date: 02/11/22                                     Social Determinants of Health (SDOH) Interventions    Readmission Risk Interventions     No data to display

## 2022-02-11 NOTE — Progress Notes (Signed)
Peripherally Inserted Central Catheter Placement  The IV Nurse has discussed with the patient and/or persons authorized to consent for the patient, the purpose of this procedure and the potential benefits and risks involved with this procedure.  The benefits include less needle sticks, lab draws from the catheter, and the patient may be discharged home with the catheter. Risks include, but not limited to, infection, bleeding, blood clot (thrombus formation), and puncture of an artery; nerve damage and irregular heartbeat and possibility to perform a PICC exchange if needed/ordered by physician.  Alternatives to this procedure were also discussed.  Bard Power PICC patient education guide, fact sheet on infection prevention and patient information card has been provided to patient /or left at bedside.    PICC Placement Documentation  PICC Single Lumen 02/11/22 Right Brachial 33 cm 0 cm (Active)  Indication for Insertion or Continuance of Line Prolonged intravenous therapies 02/11/22 1613  Exposed Catheter (cm) 0 cm 02/11/22 1613  Site Assessment Clean, Dry, Intact 02/11/22 1613  Line Status Flushed;Blood return noted;Saline locked 02/11/22 1613  Dressing Type Transparent 02/11/22 1613  Dressing Status Antimicrobial disc in place 02/11/22 [place] Connections checked and tightened 02/11/22 1613  Dressing Change Due 02/18/22 02/11/22 1613       Scotty Court 02/11/2022, 4:15 PM

## 2022-02-11 NOTE — Progress Notes (Signed)
Mobility Specialist - Progress Note   02/11/22 0930  Mobility  Activity Ambulated with assistance in hallway  Level of Assistance Standby assist, set-up cues, supervision of patient - no hands on  Assistive Device Front wheel walker  Distance Ambulated (ft) 500 ft  Activity Response Tolerated well  Mobility Referral Yes  $Mobility charge 1 Mobility   Pt received in bed and agreed to mobility, no c/o pain nor discomfort during session. Pt returned to chair with all needs met.   Roderick Pee Mobility Specialist

## 2022-02-11 NOTE — Consult Note (Signed)
[facility] Inpatient Consult   02/11/2022  ZAILEE ARA 07-Feb-1934 [id number]   [facility] [[facility]N]  Accountable Care Organization [ACO] Patient: [employer] Medicare  [facility] Liaison remote coverage review for [facility]  Primary Care Provider:  Joselyn Arrow, MD with [facility] is listed to provide the transition of care follow up.   Patient screened for hospitalization for readmission less than 7 days review  to assess for potential [facility] Customer service manager  [THN] Care Management service needs for post hospital transition for care coordination.  Review of patient's electronic medical record reveals patient is transitioning home.  Spoke with patient via hospital bedside phone and HIPAA verified.  Patient states she is going home and receiving some instructions but agree to follow up call.  Explained to anticipate call within 24 -48 hours.  She states she is supposed to have a nurse come out [facility] health].  Patient endorses PCP and to anticipate follow up call.  Plan:   Referral request for community care coordination: post hospital transitional needs.  Of note, [facility] Care Management/Population Health does not replace or interfere with any arrangements made by the Inpatient Transition of Care team.  For questions contact:   Charlesetta Shanks, RN BSN CCM [facility]  [phone] business mobile phone Toll free office [phone]  *Concierge Line  [phone] Fax number: [phone] [email or web address]

## 2022-02-11 NOTE — Progress Notes (Signed)
Supervising Physician: Ruthann Cancer  Patient Status:  [facility] - In-pt  Chief Complaint:  Pt seen in follow up of hepatic abscess drain placement 11/8.  Subjective:  Denying pain.  Sitting in chair with feet elevated.  Combing her hair.    Allergies: Patient has no known allergies.  Medications: Prior to Admission medications   Medication Sig Start Date End Date Taking? Authorizing Provider  acetaminophen (TYLENOL) 500 MG tablet Take 2 tablets (1,000 mg total) by mouth every 6 (six) hours as needed. Patient taking differently: Take 1,000 mg by mouth every 6 (six) hours as needed for moderate pain. 01/29/22  Yes Saverio Danker, PA-C  ADVIL 200 MG CAPS Take 200 mg by mouth every 6 (six) hours as needed (for headaches).   Yes [provider]  atorvastatin (LIPITOR) 40 MG tablet TAKE 1 TABLET BY MOUTH AT  BEDTIME Patient taking differently: Take 40 mg by mouth at bedtime. 11/27/21  Yes Rita Ohara, MD  Biotin 10000 MCG TABS Take 10,000 mcg by mouth in the morning.   Yes [provider]  Calcium Carbonate-Vitamin D (CALTRATE 600+D PO) Take 1 tablet by mouth daily.   Yes [provider]  ipratropium (ATROVENT) 0.06 % nasal spray USE 2 SPRAYS IN EACH NOSTRIL 3 TO 4 TIMES DAILY FOR RUNNY NOSE Patient taking differently: Place 2 sprays into both nostrils in the morning and at bedtime. 09/15/21  Yes Rita Ohara, MD  metoprolol succinate (TOPROL-XL) 50 MG 24 hr tablet Take 1 tablet (50 mg total) by mouth daily. Take with or immediately following a meal. Patient taking differently: Take 50 mg by mouth daily. 12/24/21  Yes Rita Ohara, MD  Multiple Vitamin (MULTIVITAMIN WITH MINERALS) TABS tablet Take 1 tablet by mouth daily with breakfast. CENTRUM MULTIVITAMIN   Yes [provider]  omeprazole (PRILOSEC) 20 MG capsule Take 20 mg by mouth daily before breakfast.   Yes [provider]  PREVIDENT 5000 BOOSTER PLUS 1.1 % PSTE Place 1 application  onto teeth  every 3 (three) days. 10/30/21  Yes [provider]  Probiotic Product (PROBIOTIC PO) Take 1 capsule by mouth daily.   Yes [provider]  Wheat Dextrin (BENEFIBER PO) Take 8-10 g by mouth See admin instructions. Mix 8-10 grams (2 teaspoonsful) of powder into applesauce or the suggested amount of warm water and consume 2 times a day   Yes [provider]  amoxicillin (AMOXIL) 500 MG capsule Take 2,000 mg by mouth See admin instructions. Take 2,000 mg by mouth one hour prior to dental appointments Patient not taking: Reported on 02/03/2022 12/02/21   [provider]  oxyCODONE (OXY IR/ROXICODONE) 5 MG immediate release tablet Take 1 tablet (5 mg total) by mouth every 4 (four) hours as needed for moderate pain. Patient not taking: Reported on 02/03/2022 01/29/22   Saverio Danker, PA-C     Vital Signs: BP (!) 126/55   Pulse 98   Temp 98.4 F (36.9 C) (Oral)   Resp 19   Ht '4\' 10"'$  (1.473 m)   Wt 127 lb 10.3 oz (57.9 kg)   SpO2 93%   BMI 26.68 kg/m   Physical Exam Vitals reviewed.  HENT:     Head: Normocephalic.     Mouth/Throat:     Pharynx: Oropharynx is clear.  Eyes:     Extraocular Movements: Extraocular movements intact.     Conjunctiva/sclera: Conjunctivae normal.  Cardiovascular:     Rate and Rhythm: Normal rate.  Pulmonary:  Effort: Pulmonary effort is normal. No respiratory distress.  Abdominal:     General: Abdomen is flat.     Palpations: Abdomen is soft.  Skin:    General: Skin is warm and dry.  Neurological:     General: No focal deficit present.     Mental Status: She is alert and oriented to person, place, and time.  Psychiatric:        Mood and Affect: Mood normal.        Behavior: Behavior normal.   Drain Location: RUQ Size: Fr size: 10 Fr Date of placement: 02/04/22  Currently to: Drain collection device: suction bulb 24 hour output:  Output by Drain (mL) 02/09/22 0701 - 02/09/22 1900 02/09/22 1901 - 02/10/22 0700  02/10/22 0701 - 02/10/22 1900 02/10/22 1901 - 02/11/22 0700 02/11/22 0701 - 02/11/22 1142  Closed System Drain 2 Lateral RLQ Bulb (JP)  35 0 0 0   Current examination: Flushes/aspirates easily.  Scant milky OP in bulb. Insertion site unremarkable. Suture in place. Dressed appropriately.   Imaging: [place] EKG SITE RITE  Result Date: 02/10/2022 If Site Rite image not attached, placement could not be confirmed due to current cardiac rhythm.  CT ABDOMEN PELVIS W CONTRAST  Result Date: 02/08/2022 CLINICAL DATA:  Cholecystectomy 13 days ago with postoperative fluid collections EXAM: CT ABDOMEN AND PELVIS WITH CONTRAST TECHNIQUE: Multidetector CT imaging of the abdomen and pelvis was performed using the standard protocol following bolus administration of intravenous contrast. RADIATION DOSE REDUCTION: This exam was performed according to the departmental dose-optimization program which includes automated exposure control, adjustment of the mA and/or kV according to patient size and/or use of iterative reconstruction technique. CONTRAST:  192m OMNIPAQUE IOHEXOL 300 MG/ML  SOLN COMPARISON:  02/03/2022 and the CT-guided drain placement of 02/04/2022. FINDINGS: Lower chest: Clustered nodularity within the inferior right upper lobe, likely postinfectious or inflammatory. Scarring in the right middle lobe. Small right pleural effusion is similar. Mild cardiomegaly. Hepatobiliary: No focal liver lesion. Cholecystectomy with mild intrahepatic biliary duct dilatation. The common duct measures maximally 1.0 cm in the porta hepatis, similar. Subtle hyperattenuation in the dependent common duct including on 35/4 is likely present on the prior. Surgical drain terminates adjacent the left lobe of the liver, without surrounding fluid. Right sided percutaneous drain with ill-defined surrounding collection of 2.0 x 1.9 cm on 38/4 versus 2.7 x 1.8 cm on the prior diagnostic CT. The collection about the inferior right hepatic  lobe measures 4.9 x 1.6 cm on 45/4 versus 5.7 x 2.0 cm on the prior exam. Smaller right perihepatic collection of 1.3 cm on 37/4 is similar 1.1 cm on the prior. Anterior right perihepatic collection measures 4.1 x 1.1 cm on 43/4 versus 5.0 x 1.2 cm previously. Pancreas: Normal pancreas for age. No duct dilatation or acute inflammation. Spleen: Normal in size, without focal abnormality. Adrenals/Urinary Tract: Normal adrenal glands. Bilateral too small to characterize renal lesions are most likely cysts . In the absence of clinically indicated signs/symptoms require(s) no independent follow-up. No hydronephrosis. Degraded evaluation of the pelvis, secondary to beam hardening artifact from right hip arthroplasty. Grossly normal urinary bladder. Stomach/Bowel: Proximal gastric underdistention. Normal stomach, without wall thickening. Resolved right-sided colonic wall thickening. Normal terminal ileum and appendix. Normal small bowel. Vascular/Lymphatic: Advanced aortic and branch vessel atherosclerosis. No abdominopelvic adenopathy. Reproductive: Normal uterus. Prominent gonadal veins can be seen with pelvic congestion syndrome. No adnexal mass. Other: No significant free fluid. Musculoskeletal: Right hip arthroplasty. Convex left lumbar spine curvature.  IMPRESSION: 1. Placement of right upper quadrant percutaneous drain. Mild decrease in size of the majority of the perihepatic collections as detailed above. A right perihepatic 1.3 cm collection is similar to on the prior exam. 2. Mild intrahepatic biliary duct dilatation. Possible dependent common duct stones or sludge. If bilirubin is elevated, consider MRCP. 3. Small right pleural effusion 4. Resolved right-sided colitis 5.  Aortic Atherosclerosis (ICD10-I70.0). 6. Degraded evaluation of the pelvis, secondary to beam hardening artifact from right hip arthroplasty. Electronically Signed   By: Abigail Miyamoto M.D.   On: 02/08/2022 14:20    Labs:  CBC: Recent Labs     02/08/22 0619 02/09/22 0606 02/10/22 0533 02/11/22 0550  WBC 15.2* 15.5* 13.2* 11.5*  HGB 11.3* 12.0 11.0* 11.2*  HCT 35.7* 38.3 34.3* 35.7*  PLT 434* 502* 437* 453*    COAGS: Recent Labs    02/04/22 0934  INR 1.2    BMP: Recent Labs    02/04/22 0649 02/06/22 0500 02/07/22 0851 02/08/22 0619  NA 139 138 137 136  K 3.5 3.3* 4.2 4.4  CL 104 104 107 106  CO2 '25 28 25 22  '$ GLUCOSE 69* 105* 123* 76  BUN 7* '10 9 8  '$ CALCIUM 7.9* 7.7* 8.2* 8.3*  CREATININE 0.54 0.63 0.56 0.55  GFRNONAA >60 >60 >60 >60    LIVER FUNCTION TESTS: Recent Labs    01/28/22 0428 02/03/22 1102 02/04/22 0649 02/08/22 0619  BILITOT 0.6 0.8 1.1 0.4  AST 62* 31 25 51*  ALT 50* 34 26 31  ALKPHOS 90 89 70 85  PROT 5.4* 5.8* 5.0* 5.8*  ALBUMIN 2.3* 2.3* 1.9* 2.4*    Assessment and Plan:  Hepatic abscess, s/p drainage and catheter placement on 02/04/22 Continue TID flushes with 5 cc NS. Record output Q shift.  Dressing changes QD or PRN if soiled.   Call IR APP or on call IR MD if difficulty flushing or sudden change in drain output.   Discharge orders in for clinic follow up after discharge date finalized.    IR will continue to follow - please call with questions or concerns.  Electronically Signed: Pasty Spillers, PA 02/11/2022, 11:35 AM   I spent a total of 15 Minutes at the the patient's bedside AND on the patient's hospital floor or unit, greater than 50% of which was counseling/coordinating care for drain management.

## 2022-02-11 NOTE — Progress Notes (Signed)
Pt's written rx given to pt.

## 2022-02-11 NOTE — Discharge Instructions (Addendum)
FLUSH ABDOMINAL DRAIN WITH 5 CC STERILE SALINE ONCE DAILY, RECORD DRAIN OUTPUT DAILY, CHANGE GAUZE DRESSING EVERY 2-3 DAYS; CALL [phone] WITH ANY DRAIN RELATED QUESTIONS

## 2022-02-12 ENCOUNTER — Telehealth: Payer: Self-pay | Admitting: *Deleted

## 2022-02-12 ENCOUNTER — Telehealth: Payer: Self-pay | Admitting: Family Medicine

## 2022-02-12 ENCOUNTER — Telehealth: Payer: Self-pay

## 2022-02-12 DIAGNOSIS — T8140XA Infection following a procedure, unspecified, initial encounter: Secondary | ICD-10-CM

## 2022-02-12 DIAGNOSIS — K750 Abscess of liver: Secondary | ICD-10-CM

## 2022-02-12 DIAGNOSIS — E43 Unspecified severe protein-calorie malnutrition: Secondary | ICD-10-CM

## 2022-02-12 DIAGNOSIS — K810 Acute cholecystitis: Secondary | ICD-10-CM

## 2022-02-12 NOTE — Telephone Encounter (Signed)
Pt called , she wants Dr Tomi Bamberger to send Home health nurse out . She had gallbladder surgery on 10/31 and has IV and needs to have antibiotics administered   Apparently she has a nurse already coming out but she does not know who sent them out  She did not like me asking her questions to get more information. She said Dr. Tomi Bamberger gets daily updates on Me and she and Verdene Lennert will know what to do

## 2022-02-12 NOTE — Progress Notes (Signed)
  Care Coordination   Note   02/12/2022 Name: Alexis Hines MRN: [id number] DOB: [birth year]  Alexis Hines is a 88 y.o. year old female who sees Rita Ohara, MD for primary care. I reached out to Olen Cordial by phone today to offer care coordination services.  Ms. Kinnamon was given information about Care Coordination services today including:   The Care Coordination services include support from the care team which includes your Nurse Coordinator, Clinical Social Worker, or Pharmacist.  The Care Coordination team is here to help remove barriers to the health concerns and goals most important to you. Care Coordination services are voluntary, and the patient may decline or stop services at any time by request to their care team member.   Care Coordination Consent Status: Patient agreed to services and verbal consent obtained.   Follow up plan:  Telephone appointment with care coordination team member scheduled for:  02/13/22  Encounter Outcome:  Pt. Scheduled  [place]  Direct Dial: [phone]

## 2022-02-12 NOTE — Telephone Encounter (Signed)
Pt called back  She tried to call Stacy with [facility] back but did not reach her She did leave her a message and I let her know that [place] had left her a message to call her also She is concerned about when she will get call back. I let her know that I am not sure what hour Marzetta Board works but that she would call her back. She is concerned about home health, advised her again that Surgeon would be the one to make changes to the orders and she will try to call them and let them know that she wants home health to Come out daily due to her issues

## 2022-02-12 NOTE — Telephone Encounter (Signed)
Advise pt that the hospital has arranged for home health, with the appropriate orders and instructions.  I will sign off on any appropriate orders that need to be signed, but this has already been set up for her.

## 2022-02-12 NOTE — Telephone Encounter (Signed)
Transition Care Management Follow-up Telephone Call Date of discharge and from where: [facility] 02/11/22 How have you been since you were released from the hospital? [facility] Any questions or concerns? No  Items Reviewed: Did the pt receive and understand the discharge instructions provided? Yes  Medications obtained and verified? Yes  Other? No  Any new allergies since your discharge? No  Dietary orders reviewed? Yes Do you have support at home? Yes   Home Care and Equipment/Supplies: Were home health services ordered? yes   Follow up appointments reviewed:  PCP Hospital f/u appt confirmed? No  pt. Stated she would call back in a few days to schedule a hospital f/u with Dr. Tomi Bamberger.  [facility] f/u appt confirmed? Yes  Pt. Has someone coming out to her house to help her with her drains, pt. Needs to f/u with surgery on 02/26/22, f/u with infectious disease, and f/u with radiology.  Are transportation arrangements needed? No  If their condition worsens, is the pt aware to call PCP or go to the Emergency Dept.? Yes Was the patient provided with contact information for the PCP's office or ED? Yes Was to pt encouraged to call back with questions or concerns? Yes

## 2022-02-12 NOTE — Telephone Encounter (Signed)
I called patient to let her know your response. She is upset because [facility] Nurse is only coming out once weekly and wants someone there to show how to administer IV medication (I am not sure about this) and to empty something else daily. Her friend from church cannot come every day to help her. She called her insurance and was told that of her PCP would order [facility] nurse to come daily they would pay for this. I explained that the surgery office needed to order. I also saw that [facility], Kandra Nicolas tried to reach out to patient today and she did not answer, I put in a call to her to see if she had any insight or could contact patient again.

## 2022-02-12 NOTE — Progress Notes (Signed)
  Care Coordination  Outreach Note  02/12/2022 Name: Alexis Hines MRN: [id number] DOB: 23-May-1933   Care Coordination Outreach Attempts: An unsuccessful telephone outreach was attempted today to offer the patient information about available care coordination services as a benefit of their health plan.   Follow Up Plan:  Additional outreach attempts will be made to offer the patient care coordination information and services.   Encounter Outcome:  No Answer  [place]  Direct Dial: [phone]

## 2022-02-13 ENCOUNTER — Ambulatory Visit: Payer: Self-pay

## 2022-02-13 NOTE — Telephone Encounter (Signed)
[place] social Worker sent a message to me, forwarding to you   She just D/C'd from the hospital and needs a home health RN daily to help with PICC line and drain tube. The pharmacy that is doing the meds sends a nurse once a week to do line dressing changes but that is it. She will need home health orders.  Wondering if you all can do a virtual visit to perform a F2F to get home health involved?   I just called surgeons office and they will not order home health because they have not seen her recent enough.     However we do not have any space for a virtual visit today as [facility] is full.

## 2022-02-13 NOTE — Addendum Note (Signed)
Addended by: Minette Headland A on: 02/13/2022 12:24 PM   Modules accepted: Orders

## 2022-02-13 NOTE — Patient Outreach (Signed)
  Care Coordination   Initial Visit Note   02/13/2022 Name: Alexis Hines MRN: [id number] DOB: [birth year]  Alexis Hines is a 88 y.o. year old female who sees Rita Ohara, MD for primary care. I spoke with  Olen Cordial by phone today.  What matters to the patients health and wellness today?  I need home health to help me with my PICC line and drain.    Goals Addressed             This Visit's Progress    COMPLETED: Care Coordination Activities       Care Coordination Interventions: Determined the patient recently discharged from an inpatient stay on 11/15 due to an organ infection  Discussed the patient lives alone and has a church friend who has volunteered to assist with needs in the home. This friend was taught how to administer IV antibiotics via patients PICC line and drain care. The patient feels her friend is not qualified to assist with this and would prefer home health RN to come daily rather than once per week. Patient contacted her health plan who advised she does have home health benefits that would cover daily visits if ordered Patient reports she did contact [facility] on 11/16 to request home health orders Education provided on the role of home health and needs for provider orders to change visit frequency [facility] [facility]) at [phone] to request they contact the patients surgeon for verbal orders to increase frequency of visits Advised this company does not provide home health services - the RN is to visit the patients home once per week to perform line dressing changes and draw labs. Informed the patients provider would need to send home health orders for PICC line antibiotic therapy and drain assistance to a different agency [facility] to determine the patient has not been seen since her surgery date on 10/31 when Dr. Thermon Leyland performed a laparoscopic cholecystectomy SW was  advised Dr. Thermon Leyland has declined to order home health and advised the patient she contact her primary care provider Collaboration with patients primary care provider Dr. Tomi Bamberger to advise of the above information and to request feedback on providers ability to order home health as requested Education provided to the patient that she may have to be seen prior to orders due to [facility] requirements Scheduled the patient to speak with [place] on 11/20 at 11:45 am to address ongoing care coordination needs Collaboration with Dr. Tomi Bamberger who advises she will place verbal orders for home health to assist with PICC line and drain Contacted the patient to advie Dr. Tomi Bamberger will place orders - confirmed patients friend is in the home to assist with PICC line IV antibiotic administration until home health can get involved. Patient confirmed she has received her dose today Collaboration with [place] to advise of interventions and plan         SDOH assessments and interventions completed:  No     Care Coordination Interventions Activated:  Yes  Care Coordination Interventions:  Yes, provided   Follow up plan: Follow up call scheduled for 11/20 with RN Care Manager    Encounter Outcome:  Pt. Visit Completed   Daneen Schick, Arita Miss, CDP Social Worker, Certified Dementia Practitioner [facility]  Care Coordination [phone]

## 2022-02-13 NOTE — Patient Instructions (Signed)
Visit Information  Thank you for taking time to visit with me today. Please don't hesitate to contact me if I can be of assistance to you.   Following are the goals we discussed today:   Goals Addressed             This Visit's Progress    COMPLETED: Care Coordination Activities       Care Coordination Interventions: Determined the patient recently discharged from an inpatient stay on 11/15 due to an organ infection  Discussed the patient lives alone and has a church friend who has volunteered to assist with needs in the home. This friend was taught how to administer IV antibiotics via patients PICC line and drain care. The patient feels her friend is not qualified to assist with this and would prefer home health RN to come daily rather than once per week. Patient contacted her health plan who advised she does have home health benefits that would cover daily visits if ordered Patient reports she did contact [facility] on 11/16 to request home health orders Education provided on the role of home health and needs for provider orders to change visit frequency [place] ([facility]) at [phone] to request they contact the patients surgeon for verbal orders to increase frequency of visits Advised this company does not provide home health services - the RN is to visit the patients home once per week to perform line dressing changes and draw labs. Informed the patients provider would need to send home health orders for PICC line antibiotic therapy and drain assistance to a different agency [facility] to determine the patient has not been seen since her surgery date on 10/31 when Dr. Thermon Leyland performed a laparoscopic cholecystectomy SW was advised Dr. Thermon Leyland has declined to order home health and advised the patient she contact her primary care provider Collaboration with patients primary care provider Dr. Tomi Bamberger to  advise of the above information and to request feedback on providers ability to order home health as requested Education provided to the patient that she may have to be seen prior to orders due to [facility] requirements Scheduled the patient to speak with [place] on 11/20 at 11:45 am to address ongoing care coordination needs Collaboration with Dr. Tomi Bamberger who advises she will place verbal orders for home health to assist with PICC line and drain Contacted the patient to advie Dr. Tomi Bamberger will place orders - confirmed patients friend is in the home to assist with PICC line IV antibiotic administration until home health can get involved. Patient confirmed she has received her dose today Collaboration with [place] to advise of interventions and plan         Your next appointment is by telephone on 11/20 at 11:45 am with Ava  Please call the care guide team at [phone] if you need to cancel or reschedule your appointment.   If you are experiencing a Mental Health or [place] or need someone to talk to, please call 1-800-273-TALK (toll free, 24 hour hotline)  The patient verbalized understanding of instructions, educational materials, and care plan provided today and DECLINED offer to receive copy of patient instructions, educational materials, and care plan.   Telephone follow up appointment with care management team member scheduled for:11/20  Daneen Schick, [place], CDP Social Worker, Certified Dementia Practitioner [facility]  Care Coordination [phone]

## 2022-02-13 NOTE — Telephone Encounter (Signed)
I have sent in Alexis Hines for daily care  Pt declined to schedule yesterday and states she would call back to schedule later

## 2022-02-16 ENCOUNTER — Ambulatory Visit: Payer: Self-pay

## 2022-02-16 ENCOUNTER — Telehealth: Payer: Self-pay | Admitting: Family Medicine

## 2022-02-16 DIAGNOSIS — I509 Heart failure, unspecified: Secondary | ICD-10-CM

## 2022-02-16 NOTE — Telephone Encounter (Signed)
Pt called in and states she was supposed to have a nurse start coming to her home every day but one has not showed up today. She asks if you can give her a call when you get time.

## 2022-02-16 NOTE — Patient Instructions (Signed)
Visit Information  Thank you for taking time to visit with me today. Please don't hesitate to contact me if I can be of assistance to you.   Following are the goals we discussed today:   Goals Addressed               This Visit's Progress     Patient Stated     I am concerned about having my friend administer my antibiotics and empty my drain (pt-stated)        Care Coordination Interventions: Patient interviewed about adult health maintenance status including  skilled nursing for PICC line care with antibiotic administration and drain Reviewed updated referral regarding skilled nursing for PICC line care, antibiotic administration and drain care Reviewed with patient the following noted update regarding daily skilled nursing visits, insurance will not cover a nurse coming everyday to help with picc line No HH agencies will take on patient due to her insurance Also with low staffing and the holidays, there is not enough workforce for this Pt will have to either have a friend or family member help her daily or go to a skilled nursing facility until the picc line is no longer needed Patient can pay the $150 per day that [place] quoted Determined patient has confirmed her friend will continue to provide the care she needs Discussed Zella Ball with [facility] will continue to make weekly visits to provide PICC line dressing changes Reviewed scheduled/upcoming provider appointments including: post operative follow up with Dr. Louanna Raw MD on 02/26/22 '@12'$  PM; Dr. Jule Ser DO, Infectious disease scheduled @ 4 PM Determined patient will need help with medical transportation for these appointments [place] referral to offer resources to assist with transportation         Our next appointment is by telephone on 03/04/22 at 2:30 PM  Please call the care guide team at [phone] if you need to cancel or reschedule your appointment.   If you are  experiencing a Mental Health or [place] or need someone to talk to, please call 1-800-273-TALK (toll free, 24 hour hotline)  The patient verbalized understanding of instructions, educational materials, and care plan provided today and agreed to receive a mailed copy of patient instructions, educational materials, and care plan.   Barb Merino, RN, BSN, CCM Care Management Coordinator [facility] Care Management Direct Phone: [phone]

## 2022-02-16 NOTE — Telephone Encounter (Signed)
Talk to [place].  I believe a new referral was put in to [facility] to get it daily

## 2022-02-16 NOTE — Patient Outreach (Signed)
  Care Coordination   Initial Visit Note   02/16/2022 Name: Alexis Hines MRN: [id number] DOB: Apr 29, 1933  Alexis Hines is a 88 y.o. year old female who sees Rita Ohara, MD for primary care. I spoke with  Olen Cordial by phone today.  What matters to the patients health and wellness today?  Patient is receiving IV antibiotics via a PICC line and drain care for a liver abscess.     Goals Addressed               This Visit's Progress     Patient Stated     I am concerned about having my friend administer my antibiotics and empty my drain (pt-stated)        Care Coordination Interventions: Patient interviewed about adult health maintenance status including  skilled nursing for PICC line care with antibiotic administration and drain Reviewed updated referral regarding skilled nursing for PICC line care, antibiotic administration and drain care Reviewed with patient the following noted update regarding daily skilled nursing visits, insurance will not cover a nurse coming everyday to help with picc line No HH agencies will take on patient due to her insurance Also with low staffing and the holidays, there is not enough workforce for this Pt will have to either have a friend or family member help her daily or go to a skilled nursing facility until the picc line is no longer needed Patient can pay the $150 per day that [place] quoted Determined patient has confirmed her friend will continue to provide the care she needs Discussed Zella Ball with [facility] will continue to make weekly visits to provide PICC line dressing changes Reviewed scheduled/upcoming provider appointments including: post operative follow up with Dr. Louanna Raw MD on 02/26/22 '@12'$  PM; Dr. Jule Ser DO, Infectious disease scheduled @ 4 PM Determined patient will need help with medical transportation for these appointments [place] referral to offer resources to assist with  transportation         SDOH assessments and interventions completed:  Yes  SDOH Interventions Today    Flowsheet Row Most Recent Value  SDOH Interventions   Transportation Interventions AMB Referral [place] for resources to assist with transportation         Care Coordination Interventions Activated:  Yes  Care Coordination Interventions:  Yes, provided   Follow up plan: Referral made to [place] to assist with resources for transportation Follow up call scheduled for 03/04/22 '@2'$ :30 PM    Encounter Outcome:  Pt. Visit Completed

## 2022-02-16 NOTE — Telephone Encounter (Signed)
Do you know if this was changed to every day?

## 2022-02-17 ENCOUNTER — Telehealth: Payer: Self-pay | Admitting: *Deleted

## 2022-02-17 DIAGNOSIS — K750 Abscess of liver: Secondary | ICD-10-CM | POA: Diagnosis not present

## 2022-02-17 NOTE — Telephone Encounter (Signed)
Telephone encounter was:  Successful.  02/17/2022 Name: ADRIANE GABBERT MRN: [id number] DOB: 27-Sep-1933  RAMIYAH MCCLENAHAN is a 88 y.o. year old female who is a primary care patient of Rita Ohara, MD . The community resource team was consulted for assistance with Transportation Needs   Care guide performed the following interventions: Patient provided with information about care guide support team and interviewed to confirm resource needs.  Follow Up Plan:  No further follow up planned at this time. The patient has been provided with needed resources.  [place] [phone] [address] , [place] [address] Email : Ashby Dawes. Green[place]-moran '@[place]'$ .Iyahna Obriant Plumb DOB: [birth year] MRN: [id number]   RIDER WAIVER AND RELEASE OF LIABILITY  For purposes of improving physical access to our facilities, [facility] is pleased to partner with third parties to provide [facility] patients or other authorized individuals the option of convenient, on-demand ground transportation services (the Technical brewer") through use of the technology service that enables users to request on-demand ground transportation from independent third-party providers.  By opting to use and accept these [employer], I, the undersigned, hereby agree on behalf of myself, and on behalf of any minor child using the Government social research officer for whom I am the parent or legal guardian, as follows:  Government social research officer provided to me are provided by independent third-party transportation providers who are not [employer] or employees and who are unaffiliated with [employer]. [place] is neither a transportation carrier nor a common or public carrier. [facility] has no control over the quality or safety of the transportation that occurs as a result of the [employer]. [facility] cannot guarantee that any third-party  transportation provider will complete any arranged transportation service. [facility] makes no representation, warranty, or guarantee regarding the reliability, timeliness, quality, safety, suitability, or availability of any of the Transport Services or that they will be error free. I fully understand that traveling by vehicle involves risks and dangers of serious bodily injury, including permanent disability, paralysis, and death. I agree, on behalf of myself and on behalf of any minor child using the Transport Services for whom I am the parent or legal guardian, that the entire risk arising out of my use of the [employer] remains solely with me, to the maximum extent permitted under applicable law. The [employer] are provided "as is" and "as available." [place] disclaims all representations and warranties, express, implied or statutory, not expressly set out in these terms, including the implied warranties of merchantability and fitness for a particular purpose. I hereby waive and release [place], its agents, employees, officers, directors, representatives, insurers, attorneys, assigns, successors, subsidiaries, and affiliates from any and all past, present, or future claims, demands, liabilities, actions, causes of action, or suits of any kind directly or indirectly arising from acceptance and use of the [employer]. I further waive and release  and its affiliates from all present and future liability and responsibility for any injury or death to persons or damages to property caused by or related to the use of the [employer]. I have read this Waiver and Release of Liability, and I understand the terms used in it and their legal significance. This Waiver is freely and voluntarily given with the understanding that my right (as well as the right of any minor child for whom I am the parent  or legal guardian using the [employer]) to legal recourse  against [place] in connection with the [employer] is knowingly surrendered in return for use of these services.   I attest that I read the consent document to Olen Cordial, gave Ms. Viles the opportunity to ask questions and answered the questions asked (if any). I affirm that Olen Cordial then provided consent for she's participation in this program.     Bonnielee Haff

## 2022-02-21 DIAGNOSIS — K750 Abscess of liver: Secondary | ICD-10-CM | POA: Diagnosis not present

## 2022-02-24 DIAGNOSIS — K750 Abscess of liver: Secondary | ICD-10-CM | POA: Diagnosis not present

## 2022-02-27 ENCOUNTER — Other Ambulatory Visit (HOSPITAL_COMMUNITY): Payer: Self-pay | Admitting: Surgery

## 2022-02-27 DIAGNOSIS — K750 Abscess of liver: Secondary | ICD-10-CM

## 2022-02-28 DIAGNOSIS — K750 Abscess of liver: Secondary | ICD-10-CM | POA: Diagnosis not present

## 2022-03-02 ENCOUNTER — Telehealth: Payer: Self-pay

## 2022-03-02 ENCOUNTER — Ambulatory Visit (INDEPENDENT_AMBULATORY_CARE_PROVIDER_SITE_OTHER): Payer: Medicare Other | Admitting: Internal Medicine

## 2022-03-02 ENCOUNTER — Other Ambulatory Visit: Payer: Self-pay

## 2022-03-02 ENCOUNTER — Encounter: Payer: Self-pay | Admitting: Internal Medicine

## 2022-03-02 VITALS — BP 148/88 | HR 94 | Temp 98.0°F | Wt 117.0 lb

## 2022-03-02 DIAGNOSIS — K750 Abscess of liver: Secondary | ICD-10-CM

## 2022-03-02 NOTE — Progress Notes (Signed)
[facility] for Infectious Disease  CHIEF COMPLAINT:    Follow up for liver abscess  SUBJECTIVE:    Alexis Hines is a 88 y.o. female with PMHx as below who presents to the clinic for liver abscess.   Patient here today for hospital follow up.  Patient underwent cholecystectomy due to gangrenous cholecystitis  and liver abscess 01/27/22.  Intraoperative cultures at that time grew Klebsiella pneumonia and E coli.  She was discharged home on Augmentin, however, the Klebsiella susceptibilities came back with resistance to Augmentin.  She presented back to [facility] on 02/03/22 with repeat CT scan at admission showing a surgical drain positioned in the GB fossa with residual small fluid collection measuring 2.7*1.8cm.  CT also noted additional small fluid collections measuring upwards of 5.7 x 2.0 cm and 5.0 x 1.2 cm.  Status post IR guided drain placement 02/04/2022 into this hepatic abscess. Cultures grew Kleb pna and E. Coli and a repeat CT 02/08/22 showed mild decrease in size of the majority of the perihepatic collections.  Her surgical drain was removed 11/13 and she was discharged home with an IR drain in place and PICC line to complete ceftriaxone 2gm daily and Flagyl 531m PO BID through 03/10/22.    She had follow up with surgery on 02/26/22.  No new issues noted and has IR follow up later this week on 03/05/22.  She reports some diarrhea and is taking Imodium about once daily.  She has no fevers, chills.  Reports IR drain output has been minimal.     Please see A&P for the details of today's visit and status of the patient's medical problems.   Patient's Medications  New Prescriptions   No medications on file  Previous Medications   ACETAMINOPHEN (TYLENOL) 500 MG TABLET    Take 2 tablets (1,000 mg total) by mouth every 6 (six) hours as needed.   ADVIL 200 MG CAPS    Take 200 mg by mouth every 6 (six) hours as needed (for headaches).   ATORVASTATIN (LIPITOR) 40 MG  TABLET    TAKE 1 TABLET BY MOUTH AT  BEDTIME   BIOTIN 149179MCG TABS    Take 10,000 mcg by mouth in the morning.   CALCIUM CARBONATE-VITAMIN D (CALTRATE 600+D PO)    Take 1 tablet by mouth daily.   CEFTRIAXONE (ROCEPHIN) IVPB    Inject 2 g into the vein daily for 28 days. Indication:  Liver abscess First Dose: Yes Last Day of Therapy:  03/10/22 Labs - Once weekly:  CBC/D and BMP, Labs - Every other week:  ESR and CRP Method of administration: IV Push Method of administration may be changed at the discretion of home infusion pharmacist based upon assessment of the patient and/or caregiver's ability to self-administer the medication ordered.   IPRATROPIUM (ATROVENT) 0.06 % NASAL SPRAY    USE 2 SPRAYS IN EACH NOSTRIL 3 TO 4 TIMES DAILY FOR RUNNY NOSE   METOPROLOL SUCCINATE (TOPROL-XL) 50 MG 24 HR TABLET    Take 1 tablet (50 mg total) by mouth daily. Take with or immediately following a meal.   METRONIDAZOLE (FLAGYL) 500 MG TABLET    Take 1 tablet (500 mg total) by mouth 2 (two) times daily for 28 days.   MULTIPLE VITAMIN (MULTIVITAMIN WITH MINERALS) TABS TABLET    Take 1 tablet by mouth daily with breakfast. CENTRUM MULTIVITAMIN   OMEPRAZOLE (PRILOSEC) 20 MG CAPSULE    Take 20 mg by  mouth daily before breakfast.   ONDANSETRON (ZOFRAN) 4 MG TABLET    Take 1 tablet (4 mg total) by mouth every 6 (six) hours as needed for nausea or vomiting.   PREVIDENT 5000 BOOSTER PLUS 1.1 % PSTE    Place 1 application  onto teeth every 3 (three) days.   PROBIOTIC PRODUCT (PROBIOTIC PO)    Take 1 capsule by mouth daily.   WHEAT DEXTRIN (BENEFIBER PO)    Take 8-10 g by mouth See admin instructions. Mix 8-10 grams (2 teaspoonsful) of powder into applesauce or the suggested amount of warm water and consume 2 times a day  Modified Medications   No medications on file  Discontinued Medications   No medications on file      Past Medical History:  Diagnosis Date   Abnormal TSH 2004   normal since   Arthritis     Basal cell cancer    nose; Dr. Allyson Sabal    Diverticulosis 10/2005   Dizziness    Migraine childhood   resolved   PAC (premature atrial contraction)    frequent on cardiac monitor 09/14/19   Pure hypercholesterolemia    Radial styloid tenosynovitis (de quervain) 11/2018   injected by Dr. Fredna Dow 11/2018, 02/2019   SCCA (squamous cell carcinoma) of skin 12/2015   right temple   Squamous cell carcinoma of skin 02/15/2017   in situ-left jaw (CX35FU)    Social History   Tobacco Use   Smoking status: Never   Smokeless tobacco: Never  Vaping Use   Vaping Use: Never used  Substance Use Topics   Alcohol use: Yes    Comment: occasional glass of wine.   Drug use: No    Family History  Problem Relation Age of Onset   Cancer Sister        bone cancer L leg with mets to lung, diagnosed age 78; s/p amputation   Hyperlipidemia Brother    Heart disease Brother 78       CABG   Cancer Sister 83       breast cancer and lung cancer (lung dx'd first)   Rheum arthritis Daughter    Gout Son     No Known Allergies  Review of Systems  All other systems reviewed and are negative.    OBJECTIVE:    Vitals:   03/02/22 1544  BP: (!) 148/88  Pulse: 94  Temp: 98 F (36.7 C)  TempSrc: Oral  SpO2: 99%  Weight: 117 lb (53.1 kg)   Body mass index is 24.45 kg/m.  Physical Exam Constitutional:      Appearance: Normal appearance.  HENT:     Head: Normocephalic and atraumatic.  Eyes:     Extraocular Movements: Extraocular movements intact.     Conjunctiva/sclera: Conjunctivae normal.  Pulmonary:     Effort: Pulmonary effort is normal. No respiratory distress.  Abdominal:     General: There is no distension.     Palpations: Abdomen is soft.     Comments: IR drain in place.   Musculoskeletal:        General: Normal range of motion.     Cervical back: Normal range of motion and neck supple.  Skin:    General: Skin is warm and dry.     Findings: No rash.  Neurological:     General:  No focal deficit present.     Mental Status: She is alert and oriented to person, place, and time.  Psychiatric:  Mood and Affect: Mood normal.        Behavior: Behavior normal.      Labs and Microbiology:    Latest Ref Rng & Units 02/11/2022    5:50 AM 02/10/2022    5:33 AM 02/09/2022    6:06 AM  CBC  WBC 4.0 - 10.5 K/uL 11.5  13.2  15.5   Hemoglobin 12.0 - 15.0 g/dL 11.2  11.0  12.0   Hematocrit 36.0 - 46.0 % 35.7  34.3  38.3   Platelets 150 - 400 K/uL 453  437  502       Latest Ref Rng & Units 02/08/2022    6:19 AM 02/07/2022    8:51 AM 02/06/2022    5:00 AM  CMP  Glucose 70 - 99 mg/dL 76  123  105   BUN 8 - 23 mg/dL _0 Creatinine 0.44 - 1.00 mg/dL 0.55  0.56  0.63   Sodium 135 - 145 mmol/L 136  137  138   Potassium 3.5 - 5.1 mmol/L 4.4  4.2  3.3   Chloride 98 - 111 mmol/L 106  107  104   CO2 22 - 32 mmol/L _1 Calcium 8.9 - 10.3 mg/dL 8.3  8.2  7.7   Total Protein 6.5 - 8.1 g/dL 5.8     Total Bilirubin 0.3 - 1.2 mg/dL 0.4     Alkaline Phos 38 - 126 U/L 85     AST 15 - 41 U/L 51     ALT 0 - 44 U/L 31         ASSESSMENT & PLAN:    Liver abscess Patient doing well on Ceftriaxone 2gm IV daily via PICC line and Metronidazole 52m PO BID for liver abscess s/p IR drainage.  She will follow up with them on Thursday 12/7 and antibiotic duration will be made based on that follow up.  Currently scheduled through 03/10/22.  Follow up PRN based on IR follow up.     ARaynelle Highlandfor Infectious Disease [facility] 03/02/2022, 3:58 PM

## 2022-03-02 NOTE — Telephone Encounter (Signed)
Per Dr. Juleen China no changes in patient opat order today. Spoke with Abbi at [facility] to confirm patient is on antbx through 12/12. Will update order pending on IR appointment 12/7. Leatrice Jewels, RMA

## 2022-03-02 NOTE — Assessment & Plan Note (Signed)
Patient doing well on Ceftriaxone 2gm IV daily via PICC line and Metronidazole '500mg'$  PO BID for liver abscess s/p IR drainage.  She will follow up with them on Thursday 12/7 and antibiotic duration will be made based on that follow up.  Currently scheduled through 03/10/22.  Follow up PRN based on IR follow up.

## 2022-03-02 NOTE — Patient Instructions (Signed)
Thank you for coming to see me today. It was a pleasure seeing you.  To Do: Continue ceftriaxone via picc line Continu metronidazole oral antibiotic  Follow up with IR on 12/7 I will let you know about antibiotics following the IR visit  If you have any questions or concerns, please do not hesitate to call the office at [phone].  Take Care,   Jule Ser

## 2022-03-04 ENCOUNTER — Ambulatory Visit: Payer: Self-pay

## 2022-03-04 NOTE — Patient Outreach (Signed)
  Care Coordination   03/04/2022 Name: SAARAH DEWING MRN: [id number] DOB: 07-30-1933   Care Coordination Outreach Attempts:  An unsuccessful telephone outreach was attempted for a scheduled appointment today.  Follow Up Plan:  Additional outreach attempts will be made to offer the patient care coordination information and services.   Encounter Outcome:  No Answer   Care Coordination Interventions:  No, not indicated    Barb Merino, RN, BSN, CCM Care Management Coordinator [facility] Care Management  Direct Phone: [phone]

## 2022-03-05 ENCOUNTER — Ambulatory Visit
Admission: RE | Admit: 2022-03-05 | Discharge: 2022-03-05 | Disposition: A | Discharge status: Home / Self Care | Payer: Medicare Other | Source: Ambulatory Visit | Attending: Surgery | Admitting: Surgery

## 2022-03-05 ENCOUNTER — Other Ambulatory Visit: Payer: Self-pay | Admitting: Surgery

## 2022-03-05 ENCOUNTER — Ambulatory Visit
Admission: RE | Admit: 2022-03-05 | Discharge: 2022-03-05 | Disposition: A | Discharge status: Home / Self Care | Payer: Medicare Other | Source: Ambulatory Visit | Attending: Radiology | Admitting: Radiology

## 2022-03-05 DIAGNOSIS — Z4682 Encounter for fitting and adjustment of non-vascular catheter: Secondary | ICD-10-CM | POA: Diagnosis not present

## 2022-03-05 DIAGNOSIS — K750 Abscess of liver: Secondary | ICD-10-CM

## 2022-03-05 DIAGNOSIS — Z9049 Acquired absence of other specified parts of digestive tract: Secondary | ICD-10-CM | POA: Diagnosis not present

## 2022-03-05 DIAGNOSIS — J9811 Atelectasis: Secondary | ICD-10-CM | POA: Diagnosis not present

## 2022-03-05 DIAGNOSIS — I700 Atherosclerosis of aorta: Secondary | ICD-10-CM | POA: Diagnosis not present

## 2022-03-05 HISTORY — PX: IR RADIOLOGIST EVAL & MGMT: IMG5224

## 2022-03-05 MED ORDER — IOPAMIDOL (ISOVUE-300) INJECTION 61%
100.0000 mL | Freq: Once | INTRAVENOUS | Status: AC | PRN
  Administered 2022-03-05: 100 mL via INTRAVENOUS

## 2022-03-05 NOTE — Progress Notes (Addendum)
Referring Physician(s): Dr Elayne Guerin  Chief Complaint: The patient is seen in follow up today s/p Successful CT guided placement of a 10.2 French all purpose drain catheter into the right subcapsular hepatic abscess in IR on 02/04/22  History of present illness:     Post recent cholecystectomy with subcapsular hepatic fluid collection concerning for abscess. Cholecystectomy 01/27/22 Subcapsular hepatic abscess development and abscess drain placed in IR 02/04/22  She denies pain; N/V Denies fever/chills OP minimal for days Flushing easily Follows with HH and ID-- PICC line intact: Rocephin IV Follws with Dr Thermon Leyland--- not sure of next appt  Scheduled today for CT and evaluation of drain   Past Medical History:  Diagnosis Date   Abnormal TSH 2004   normal since   Arthritis    Basal cell cancer    nose; Dr. Allyson Sabal    Diverticulosis 10/2005   Dizziness    Migraine childhood   resolved   PAC (premature atrial contraction)    frequent on cardiac monitor 09/14/19   Pure hypercholesterolemia    Radial styloid tenosynovitis (de quervain) 11/2018   injected by Dr. Fredna Dow 11/2018, 02/2019   SCCA (squamous cell carcinoma) of skin 12/2015   right temple   Squamous cell carcinoma of skin 02/15/2017   in situ-left jaw (CX35FU)    Past Surgical History:  Procedure Laterality Date   BREAST BIOPSY Left 1984   benign   CHOLECYSTECTOMY N/A 01/27/2022   Procedure: LAPAROSCOPIC CHOLECYSTECTOMY;  Surgeon: Felicie Morn, MD;  Location: [facility];  Service: General;  Laterality: N/A;   EYE SURGERY     Bil and eyelids lifted   left hip intra-articular steriod injection  03/13/2014   Mohs procedure     REVERSE SHOULDER ARTHROPLASTY Left 12/26/2020   Procedure: REVERSE SHOULDER ARTHROPLASTY;  Surgeon: Justice Britain, MD;  Location: [facility];  Service: Orthopedics;  Laterality: Left;  114mn   TONSILLECTOMY  age 89  TOTAL HIP ARTHROPLASTY Right 10/10/2019   Procedure: TOTAL HIP  ARTHROPLASTY ANTERIOR APPROACH;  Surgeon: OParalee Cancel MD;  Location: [facility];  Service: Orthopedics;  Laterality: Right;  70 mins   TOTAL KNEE ARTHROPLASTY Left 07/2003   Dr. CTheda Sers  TOTAL KNEE ARTHROPLASTY Right 09/03/2017   Procedure: RIGHT TOTAL KNEE ARTHROPLASTY;  Surgeon: CSydnee Cabal MD;  Location: [facility];  Service: Orthopedics;  Laterality: Right;    Allergies: Patient has no known allergies.  Medications: Prior to Admission medications   Medication Sig Start Date End Date Taking? Authorizing Provider  acetaminophen (TYLENOL) 500 MG tablet Take 2 tablets (1,000 mg total) by mouth every 6 (six) hours as needed. Patient taking differently: Take 1,000 mg by mouth every 6 (six) hours as needed for moderate pain. 01/29/22   OSaverio Danker PA-C  ADVIL 200 MG CAPS Take 200 mg by mouth every 6 (six) hours as needed (for headaches).    [provider]  atorvastatin (LIPITOR) 40 MG tablet TAKE 1 TABLET BY MOUTH AT  BEDTIME Patient taking differently: Take 40 mg by mouth at bedtime. 11/27/21   KRita Ohara MD  Biotin 10000 MCG TABS Take 10,000 mcg by mouth in the morning.    [provider]  Calcium Carbonate-Vitamin D (CALTRATE 600+D PO) Take 1 tablet by mouth daily.    [provider]  cefTRIAXone (ROCEPHIN) IVPB Inject 2 g into the vein daily for 28 days. Indication:  Liver abscess First Dose: Yes Last Day of Therapy:  03/10/22 Labs - Once weekly:  CBC/D  and BMP, Labs - Every other week:  ESR and CRP Method of administration: IV Push Method of administration may be changed at the discretion of home infusion pharmacist based upon assessment of the patient and/or caregiver's ability to self-administer the medication ordered. 02/11/22 03/11/22  Shalhoub, Sherryll Burger, MD  ipratropium (ATROVENT) 0.06 % nasal spray USE 2 SPRAYS IN EACH NOSTRIL 3 TO 4 TIMES DAILY FOR RUNNY NOSE Patient taking differently: Place 2 sprays into both nostrils in the morning and at bedtime.  09/15/21   Rita Ohara, MD  metoprolol succinate (TOPROL-XL) 50 MG 24 hr tablet Take 1 tablet (50 mg total) by mouth daily. Take with or immediately following a meal. Patient taking differently: Take 50 mg by mouth daily. 12/24/21   Rita Ohara, MD  metroNIDAZOLE (FLAGYL) 500 MG tablet Take 1 tablet (500 mg total) by mouth 2 (two) times daily for 28 days. 02/11/22 03/11/22  Shalhoub, Sherryll Burger, MD  Multiple Vitamin (MULTIVITAMIN WITH MINERALS) TABS tablet Take 1 tablet by mouth daily with breakfast. CENTRUM MULTIVITAMIN    [provider]  omeprazole (PRILOSEC) 20 MG capsule Take 20 mg by mouth daily before breakfast.    [provider]  ondansetron (ZOFRAN) 4 MG tablet Take 1 tablet (4 mg total) by mouth every 6 (six) hours as needed for nausea or vomiting. 02/11/22   Shalhoub, Sherryll Burger, MD  PREVIDENT 5000 BOOSTER PLUS 1.1 % PSTE Place 1 application  onto teeth every 3 (three) days. 10/30/21   [provider]  Probiotic Product (PROBIOTIC PO) Take 1 capsule by mouth daily.    [provider]  Wheat Dextrin (BENEFIBER PO) Take 8-10 g by mouth See admin instructions. Mix 8-10 grams (2 teaspoonsful) of powder into applesauce or the suggested amount of warm water and consume 2 times a day    [provider]     Family History  Problem Relation Age of Onset   Cancer Sister        bone cancer L leg with mets to lung, diagnosed age 10; s/p amputation   Hyperlipidemia Brother    Heart disease Brother 12       CABG   Cancer Sister 46       breast cancer and lung cancer (lung dx'd first)   Rheum arthritis Daughter    Gout Son     Social History   Socioeconomic History   Marital status: Widowed    Spouse name: Not on file   Number of children: 2   Years of education: Not on file   Highest education level: Not on file  Occupational History   Occupation: Retired Psychologist, prison and probation services  Tobacco Use   Smoking status: Never   Smokeless tobacco: Never  Vaping Use    Vaping Use: Never used  Substance and Sexual Activity   Alcohol use: Yes    Comment: occasional glass of wine.   Drug use: No   Sexual activity: Not Currently  Other Topics Concern   Not on file  Social History Narrative   Widowed. Lives 1/2 the year in [place]. Her husband (retired Tax adviser) died from liver cancer in 04/30/11.  Daughter lives in [place]; 2 grandchildren here, and 2 in [place] Determinants of Health   Financial Resource Strain: Not on file  Food Insecurity: No Food Insecurity (02/03/2022)   Hunger Vital Sign    Worried About Running Out of Food in the Last Year: Never true    Ran Out of Food in  the Last Year: Never true  Transportation Needs: No Transportation Needs (02/17/2022)   PRAPARE - Hydrologist (Medical): No    Lack of Transportation (Non-Medical): No  Physical Activity: Not on file  Stress: Not on file  Social Connections: Not on file     Vital Signs: 97.8;  156/73  Physical Exam Skin:    General: Skin is warm.     Comments: Site is clean and dry NT no bleeding; no sign of infection  JP drain intact OP is scant fluid in JP  CT reviewed with Dr Maryelizabeth Kaufmann Abscess resolved  Drain removed without complication Dressing placed     Imaging: No results found.  Labs:  CBC: Recent Labs    02/08/22 0619 02/09/22 0606 02/10/22 0533 02/11/22 0550  WBC 15.2* 15.5* 13.2* 11.5*  HGB 11.3* 12.0 11.0* 11.2*  HCT 35.7* 38.3 34.3* 35.7*  PLT 434* 502* 437* 453*    COAGS: Recent Labs    02/04/22 0934  INR 1.2    BMP: Recent Labs    02/04/22 0649 02/06/22 0500 02/07/22 0851 02/08/22 0619  NA 139 138 137 136  K 3.5 3.3* 4.2 4.4  CL 104 104 107 106  CO2 _0 GLUCOSE 69* 105* 123* 76  BUN 7* _1 CALCIUM 7.9* 7.7* 8.2* 8.3*  CREATININE 0.54 0.63 0.56 0.55  GFRNONAA >60 >60 >60 >60    LIVER FUNCTION TESTS: Recent Labs    01/28/22 0428 02/03/22 1102 02/04/22 0649  02/08/22 0619  BILITOT 0.6 0.8 1.1 0.4  AST 62* 31 25 51*  ALT 50* 34 26 31  ALKPHOS 90 89 70 85  PROT 5.4* 5.8* 5.0* 5.8*  ALBUMIN 2.3* 2.3* 1.9* 2.4*    Assessment:  Subcapsular hepatic abscess resolved on CT per Dr Donah Driver removed without complication Pt is to be sure of follow up with Dr Thermon Leyland [facility] will see pt tomorrow--- please determine needs for PICC [er Dr Juleen China Pt has good understanding pf plan  Signed: Lavonia Drafts, PA-C 03/05/2022, 2:03 PM   Please refer to Dr. Maryelizabeth Kaufmann attestation of this note for management and plan.   --------------------------------------  I reviewed the Advanced Practice Provider's note. I agree with the findings and plan as described.    Michaelle Birks, MD [facility]   Pager. [place]

## 2022-03-07 DIAGNOSIS — K750 Abscess of liver: Secondary | ICD-10-CM | POA: Diagnosis not present

## 2022-03-09 ENCOUNTER — Ambulatory Visit (INDEPENDENT_AMBULATORY_CARE_PROVIDER_SITE_OTHER): Payer: Medicare Other | Admitting: Internal Medicine

## 2022-03-09 ENCOUNTER — Encounter: Payer: Self-pay | Admitting: Internal Medicine

## 2022-03-09 ENCOUNTER — Other Ambulatory Visit: Payer: Self-pay

## 2022-03-09 VITALS — BP 159/80 | HR 79 | Temp 97.4°F | Ht <= 58 in | Wt 115.0 lb

## 2022-03-09 DIAGNOSIS — K750 Abscess of liver: Secondary | ICD-10-CM | POA: Diagnosis not present

## 2022-03-09 NOTE — Progress Notes (Unsigned)
PICC Removal    PICC length & location:  right brachial 33 cm Removed per verbal order from: Dr. Juleen China   Blood thinners:  none Platelet count:  282 ([facility] 03/02/22)  Site assessment: Dressing clean and dry. Extremity dry with palpable radial pulse. No redness, drainage, or swelling present at insertion site.   Pre-removal vital signs:  BP:  158/90 HR:  62 SpO2:  unable to read due to cold extremities   Patient unable to tolerate flat, supine position. No sutures present. Insertion site cleaned with CHG, catheter removed and petroleum dressing applied. Tip intact. Pressure held until hemostasis achieved.    Length of catheter removed:  33 cm   Provided patient with after care instructions and precautions print out (via [facility]). Reviewed this information with patient.   Patient verbalized understanding and agreement, all questions answered. Patient tolerated procedure well and remained in clinic under the care of RN 30 minutes post removal.  Post-observation vital signs:  BP:  159/80 HR:  79 SpO2:  96% room air  Notified Carolynn Sayers, RN with [facility] and [facility] pharmacy team of removal.  Beryle Flock, RN

## 2022-03-09 NOTE — Patient Instructions (Signed)
Thank you for coming to see me today. It was a pleasure seeing you.  To Do: CT scan showed the abscess was resolved We removed PICC line today Stop taking antibiotics (Ceftriaxone via IV and Metronidazole by mouth) Have a great Christmas and enjoy [place]!  If you have any questions or concerns, please do not hesitate to call the office at [phone].  Take Care,   Jule Ser

## 2022-03-09 NOTE — Assessment & Plan Note (Signed)
She has radiographic resolution of hepatic abscess based on CT scan 03/05/22 and her drain was removed.  She has continued on her antibiotics with ceftriaxone and metronidazole since that time. Will go ahead and stop antibiotics today and remove her PICC line.  Follow up as needed.

## 2022-03-09 NOTE — Progress Notes (Unsigned)
[facility]  CHIEF COMPLAINT:    Follow up for liver abscess  SUBJECTIVE:    Alexis Hines is a 88 y.o. female with PMHx as below who presents to the clinic for liver abscess.   Patient underwent cholecystectomy due to gangrenous cholecystitis  and liver abscess 01/27/22.  Intraoperative cultures at that time grew Klebsiella pneumonia and E coli.  She was discharged home on Augmentin, however, the Klebsiella susceptibilities came back with resistance to Augmentin.  She presented back to [facility] on 02/03/22 with repeat CT scan at admission showing a surgical drain positioned in the GB fossa with residual small fluid collection measuring 2.7*1.8cm.  CT also noted additional small fluid collections measuring upwards of 5.7 x 2.0 cm and 5.0 x 1.2 cm.  Status post IR guided drain placement 02/04/2022 into this hepatic abscess. Cultures grew Kleb pna and E. Coli and a repeat CT 02/08/22 showed mild decrease in size of the majority of the perihepatic collections.  Her surgical drain was removed 11/13 and she was discharged home with an IR drain in place and PICC line to complete ceftriaxone 2gm daily and Flagyl 561m PO BID through 03/10/22.  She had follow up with me 03/02/22 and reported no major issues.  She had follow up with IR last week on 03/05/22 at which time repeat CT and drain evaluation showed resolution of the abscess.  Her drain was removed with out complications.  She is continuing her antibiotics of Ceftriaxone and Metronidazole.    Please see A&P for the details of today's visit and status of the patient's medical problems.   Patient's Medications  New Prescriptions   No medications on file  Previous Medications   ACETAMINOPHEN (TYLENOL) 500 MG TABLET    Take 2 tablets (1,000 mg total) by mouth every 6 (six) hours as needed.   ADVIL 200 MG CAPS    Take 200 mg by mouth every 6 (six) hours as needed (for headaches).   ATORVASTATIN (LIPITOR) 40  MG TABLET    TAKE 1 TABLET BY MOUTH AT  BEDTIME   BIOTIN 152080MCG TABS    Take 10,000 mcg by mouth in the morning.   CALCIUM CARBONATE-VITAMIN D (CALTRATE 600+D PO)    Take 1 tablet by mouth daily.   IPRATROPIUM (ATROVENT) 0.06 % NASAL SPRAY    USE 2 SPRAYS IN EACH NOSTRIL 3 TO 4 TIMES DAILY FOR RUNNY NOSE   METOPROLOL SUCCINATE (TOPROL-XL) 50 MG 24 HR TABLET    Take 1 tablet (50 mg total) by mouth daily. Take with or immediately following a meal.   MULTIPLE VITAMIN (MULTIVITAMIN WITH MINERALS) TABS TABLET    Take 1 tablet by mouth daily with breakfast. CENTRUM MULTIVITAMIN   OMEPRAZOLE (PRILOSEC) 20 MG CAPSULE    Take 20 mg by mouth daily before breakfast.   ONDANSETRON (ZOFRAN) 4 MG TABLET    Take 1 tablet (4 mg total) by mouth every 6 (six) hours as needed for nausea or vomiting.   PREVIDENT 5000 BOOSTER PLUS 1.1 % PSTE    Place 1 application  onto teeth every 3 (three) days.   PROBIOTIC PRODUCT (PROBIOTIC PO)    Take 1 capsule by mouth daily.   WHEAT DEXTRIN (BENEFIBER PO)    Take 8-10 g by mouth See admin instructions. Mix 8-10 grams (2 teaspoonsful) of powder into applesauce or the suggested amount of warm water and consume 2 times a day  Modified Medications  No medications on file  Discontinued Medications   CEFTRIAXONE (ROCEPHIN) IVPB    Inject 2 g into the vein daily for 28 days. Indication:  Liver abscess First Dose: Yes Last Day of Therapy:  03/10/22 Labs - Once weekly:  CBC/D and BMP, Labs - Every other week:  ESR and CRP Method of administration: IV Push Method of administration may be changed at the discretion of home infusion pharmacist based upon assessment of the patient and/or caregiver's ability to self-administer the medication ordered.   METRONIDAZOLE (FLAGYL) 500 MG TABLET    Take 1 tablet (500 mg total) by mouth 2 (two) times daily for 28 days.      Past Medical History:  Diagnosis Date   Abnormal TSH 2004   normal since   Arthritis    Basal cell cancer     nose; Dr. Allyson Sabal    Diverticulosis 10/2005   Dizziness    Migraine childhood   resolved   PAC (premature atrial contraction)    frequent on cardiac monitor 09/14/19   Pure hypercholesterolemia    Radial styloid tenosynovitis (de quervain) 11/2018   injected by Dr. Fredna Dow 11/2018, 02/2019   SCCA (squamous cell carcinoma) of skin 12/2015   right temple   Squamous cell carcinoma of skin 02/15/2017   in situ-left jaw (CX35FU)    Social History   Tobacco Use   Smoking status: Never   Smokeless tobacco: Never  Vaping Use   Vaping Use: Never used  Substance Use Topics   Alcohol use: Yes    Comment: occasional glass of wine.   Drug use: No    Family History  Problem Relation Age of Onset   Cancer Sister        bone cancer L leg with mets to lung, diagnosed age 51; s/p amputation   Hyperlipidemia Brother    Heart disease Brother 22       CABG   Cancer Sister 76       breast cancer and lung cancer (lung dx'd first)   Rheum arthritis Daughter    Gout Son     No Known Allergies  Review of Systems  Constitutional: Negative.   Gastrointestinal: Negative.      OBJECTIVE:    Vitals:   03/09/22 1546 03/09/22 1550  BP:  (!) 158/90  Pulse:  62  Temp:  (!) 97.4 F (36.3 C)  TempSrc:  Temporal  Weight: 115 lb (52.2 kg)   Height: _0  (1.473 m)    Body mass index is 24.04 kg/m.  Physical Exam Constitutional:      Appearance: Normal appearance.  Eyes:     Extraocular Movements: Extraocular movements intact.     Conjunctiva/sclera: Conjunctivae normal.  Pulmonary:     Effort: Pulmonary effort is normal. No respiratory distress.  Abdominal:     General: There is no distension.     Palpations: Abdomen is soft.  Musculoskeletal:        General: Normal range of motion.     Cervical back: Normal range of motion and neck supple.     Comments: Right UE PICC line in place.   Skin:    General: Skin is warm and dry.  Neurological:     General: No focal deficit present.      Mental Status: She is alert. Mental status is at baseline.  Psychiatric:        Mood and Affect: Mood normal.        Behavior: Behavior normal.  Labs and Microbiology:    Latest Ref Rng & Units 02/11/2022    5:50 AM 02/10/2022    5:33 AM 02/09/2022    6:06 AM  CBC  WBC 4.0 - 10.5 K/uL 11.5  13.2  15.5   Hemoglobin 12.0 - 15.0 g/dL 11.2  11.0  12.0   Hematocrit 36.0 - 46.0 % 35.7  34.3  38.3   Platelets 150 - 400 K/uL 453  437  502       Latest Ref Rng & Units 02/08/2022    6:19 AM 02/07/2022    8:51 AM 02/06/2022    5:00 AM  CMP  Glucose 70 - 99 mg/dL 76  123  105   BUN 8 - 23 mg/dL _0 Creatinine 0.44 - 1.00 mg/dL 0.55  0.56  0.63   Sodium 135 - 145 mmol/L 136  137  138   Potassium 3.5 - 5.1 mmol/L 4.4  4.2  3.3   Chloride 98 - 111 mmol/L 106  107  104   CO2 22 - 32 mmol/L _1 Calcium 8.9 - 10.3 mg/dL 8.3  8.2  7.7   Total Protein 6.5 - 8.1 g/dL 5.8     Total Bilirubin 0.3 - 1.2 mg/dL 0.4     Alkaline Phos 38 - 126 U/L 85     AST 15 - 41 U/L 51     ALT 0 - 44 U/L 31         ASSESSMENT & PLAN:    Liver abscess She has radiographic resolution of hepatic abscess based on CT scan 03/05/22 and her drain was removed.  She has continued on her antibiotics with ceftriaxone and metronidazole since that time. Will go ahead and stop antibiotics today and remove her PICC line.  Follow up as needed.      Raynelle [facility] 03/09/2022, 4:07 PM

## 2022-03-10 ENCOUNTER — Telehealth: Payer: Self-pay

## 2022-03-10 NOTE — Telephone Encounter (Signed)
Patient called stating that she has had upper abdominal pain and mild nausea since about 3 AM this morning. Denies vomiting. Patient is taking Imodium for diarrhea but it is improving a little. Has Zofran on medication list but not taking. Patient wanted to know if Dr.Wallace recommended any OTC medications.   [place], CMA

## 2022-03-10 NOTE — Telephone Encounter (Signed)
Patient aware of Dr.Wallace recommendations and voiced her understanding.   [place], CMA

## 2022-03-12 ENCOUNTER — Ambulatory Visit (INDEPENDENT_AMBULATORY_CARE_PROVIDER_SITE_OTHER): Payer: Medicare Other | Admitting: Family Medicine

## 2022-03-12 ENCOUNTER — Encounter: Payer: Self-pay | Admitting: Family Medicine

## 2022-03-12 VITALS — BP 132/70 | HR 88 | Temp 98.6°F | Ht <= 58 in | Wt 114.0 lb

## 2022-03-12 DIAGNOSIS — K750 Abscess of liver: Secondary | ICD-10-CM | POA: Diagnosis not present

## 2022-03-12 DIAGNOSIS — J310 Chronic rhinitis: Secondary | ICD-10-CM

## 2022-03-12 DIAGNOSIS — R197 Diarrhea, unspecified: Secondary | ICD-10-CM | POA: Diagnosis not present

## 2022-03-12 DIAGNOSIS — R1010 Upper abdominal pain, unspecified: Secondary | ICD-10-CM | POA: Diagnosis not present

## 2022-03-12 LAB — POCT URINALYSIS DIP (PROADVANTAGE DEVICE)
Bilirubin, UA: NEGATIVE
Blood, UA: NEGATIVE
Glucose, UA: NEGATIVE mg/dL
Ketones, POC UA: NEGATIVE mg/dL
Leukocytes, UA: NEGATIVE
Nitrite, UA: NEGATIVE
Protein Ur, POC: NEGATIVE mg/dL
Specific Gravity, Urine: 1.02
Urobilinogen, Ur: 0.2
pH, UA: 6 (ref 5.0–8.0)

## 2022-03-12 NOTE — Patient Instructions (Addendum)
Diarrhea--you may want to limit your dairy intake, or use lactaid tablets prior to having dairy. You can try a lactose-free milk (Lactaid is one of the brands).  Watery diarrhea can make you temporarily lose your lactase enzyme that helps you digest dairy. Try this for a week or two and see if this helps with the diarrhrea.  Diarrhea is very common after gall bladder surgery--usually worse after eating fried or greasy foods. Try and avoid these foods. If you are having a lot of diarrhea after eating (even when you're careful with your food choices), we can consider adding a bile acid resin to help reduce the diarrhea. Continue to take the probiotic daily.  Try and use the imodium only if needed for frequent loose stools. I'm afraid if you use it too often you might end up getting constipated, which might be more problematic for you. Be sure to follow a high fiber diet, and continue your benefiber daily.  If your runny nose becomes BOTHERSOME, then you may restart whichever medications you found most effective, you don't need to start them all (having previously used ipratroprium, zyrtec and flonase).  Be sure to follow up if you have worsening abdominal pain, fever, black or bloody stools.

## 2022-03-12 NOTE — Progress Notes (Signed)
Chief Complaint  Patient presents with   Abdominal Pain    Upper stomach pain x 4 days. Lasts about 40 minute. First time(Mon) was at night and lasted 40 minutes. Next time was was during the day and lasted 30-40 minutes and once she had a bowel movement she was ok. Called ID doctor and she was told to call PCP. Has diarrhea and was told this would be common after surgery, taking imodium and using Bene-fiber. Has an appetite but when she starts eating it's hard to get it down.    Nasal Congestion    Still has runny nose. Do you want her to go back to allergy meds? Also around her ears is itchy. Her skin is very dry.    Patient presents for hospital follow-up, with complaint of abdominal pain.   She is s/p cholecystectomy (10/31) for gangrenous cholecystitis and liver abscess. Intraoperative cultures  grew Klebsiella pneumonia and E coli.  She was discharged home on Augmentin, however, the Klebsiella susceptibilities came back with resistance to Augmentin.  She was readmitted 11/7 with postop infection/abscess. She underwent IR guided drain placement into the hepatic abscess on 02/04/2022. Cultures grew Klebsiella and E. Coli. Repeat CT 02/08/22 showed mild decrease in size of the majority of the perihepatic collections, surgical drain was removed 11/13. She was discharged home 11/15 with an IR drain, and PICC line to complete ceftriaxone 2gm daily and Flagyl 544m PO BID through 03/10/22.  She had f/u with IR on 03/05/22 at which time repeat CT showed resolution of the abscess.  Her drain was removed. She states that she completed Flagyl on Tuesday, finished last IV ABX on Monday, and had had PICC removed 12/11.  She called to schedule this appointment today due to some issues with abdominal pain. She states that on 12/12 she had a stomach ache--woke up with it, upper stomach.  Lasted 40 minutes. She got up and walked around, and it resolved on its own. She denies any nausea, vomiting, heartburn. She  went back to bed, woke up again at 2am with the same pain, went away after 30 minutes. She states that the pain was across the whole upper stomach, no focal area of increased pain, no radiation.  Once on 12/13 she had a discomfort--she had an urge to defecate, and the pain resolved after having a bowel movement. No further pain today. Had a small loose bowel movement this morning. She keeps track of her BM's at home. She has been having some diarrhea, and takes imodium daily.  Chart/labs reviewed Last CBC was 11/15, WBC improving, still elevated at 11.5. Last chem 02/08/22--AST elevated at 51, low alb 2.4, low Ca 8.3, the rest was normal.  Imaging as noted above was reviewed.  PMH, PSH, SH reviewed  Outpatient Encounter Medications as of 03/12/2022  Medication Sig Note   atorvastatin (LIPITOR) 40 MG tablet TAKE 1 TABLET BY MOUTH AT  BEDTIME (Patient taking differently: Take 40 mg by mouth at bedtime.)    Biotin 10000 MCG TABS Take 10,000 mcg by mouth in the morning.    Calcium Carbonate-Vitamin D (CALTRATE 600+D PO) Take 1 tablet by mouth daily.    loperamide (IMODIUM) 2 MG capsule Take 2 mg by mouth as needed for diarrhea or loose stools. 03/12/2022: Taking one daily   metoprolol succinate (TOPROL-XL) 50 MG 24 hr tablet Take 1 tablet (50 mg total) by mouth daily. Take with or immediately following a meal. (Patient taking differently: Take 50 mg by mouth daily.)  Multiple Vitamin (MULTIVITAMIN WITH MINERALS) TABS tablet Take 1 tablet by mouth daily with breakfast. CENTRUM MULTIVITAMIN    PREVIDENT 5000 BOOSTER PLUS 1.1 % PSTE Place 1 application  onto teeth every 3 (three) days.    Probiotic Product (PROBIOTIC PO) Take 1 capsule by mouth daily.    Wheat Dextrin (BENEFIBER PO) Take 8-10 g by mouth See admin instructions. Mix 8-10 grams (2 teaspoonsful) of powder into applesauce or the suggested amount of warm water and consume 2 times a day    acetaminophen (TYLENOL) 500 MG tablet Take 2  tablets (1,000 mg total) by mouth every 6 (six) hours as needed. (Patient not taking: Reported on 03/09/2022) 03/12/2022: prn   ADVIL 200 MG CAPS Take 200 mg by mouth every 6 (six) hours as needed (for headaches). (Patient not taking: Reported on 03/09/2022) 03/12/2022: prn   ipratropium (ATROVENT) 0.06 % nasal spray USE 2 SPRAYS IN EACH NOSTRIL 3 TO 4 TIMES DAILY FOR RUNNY NOSE (Patient not taking: Reported on 03/12/2022)    ondansetron (ZOFRAN) 4 MG tablet Take 1 tablet (4 mg total) by mouth every 6 (six) hours as needed for nausea or vomiting. (Patient not taking: Reported on 03/09/2022) 03/12/2022: prn   [DISCONTINUED] omeprazole (PRILOSEC) 20 MG capsule Take 20 mg by mouth daily before breakfast. (Patient not taking: Reported on 03/09/2022)    No facility-administered encounter medications on file as of 03/12/2022.   No Known Allergies  ROS: no fever, chills, URI symptoms. Continues to have chronic runny nose (more on the right than the left), clear. No sinus pain, no discoloration, and no fever. No headaches, dizziness, chest pain, shortness of breath. No nausea, vomiting.  Abdominal pain and diarrhea per HPI. No melena or hematochezia. Appetite has been good. Moods are good.   PHYSICAL EXAM:  BP 132/70   Pulse 88   Temp 98.6 F (37 C) (Tympanic)   Ht _0  (1.473 m)   Wt 114 lb (51.7 kg)   BMI 23.83 kg/m   Wt Readings from Last 3 Encounters:  03/12/22 114 lb (51.7 kg)  03/09/22 115 lb (52.2 kg)  03/02/22 117 lb (53.1 kg)  Wt 121# 6.4 oz pre-hospitalization in 12/2021  Elderly female, in good spirits, in no distress HEENT: conjunctiva and sclera are clear, EOMI.  No nasal drainage noted during visit (not using any tissues or sniffling, no drainage). OP clear. Sinuses are nontender Neck: no lymphadenopathy, thyromegaly or mass Heart: regular rate and rhythm, with Intermittent ectopic beats. No murmur Lungs: clear bilaterally Back: no spinal or CVA tenderness Abdomen:   WHSS at RUQ, epigastrium, umbilicus, healing scab at RU posterior arm from recent PICC line. Abdomen is soft, nontender, no organomegaly or mass Extremities: no edema Neuro: alert and oriented, cranial nerves grossly intact, normal gait. Psych: normal mood, affect, hygiene and grooming  Urine dip: SG 1.020, negative   ASSESSMENT/PLAN:  Pain of upper abdomen - resolved. Ddx reviewed. Reassurred benign exam. Recheck labs - Plan: POCT Urinalysis DIP (Proadvantage Device), CBC with Differential/Platelet, Comprehensive metabolic panel  Diarrhea, unspecified type - post-cholecystectomy. Reviewed proper diet.  Poss lactose intolerance d/t diarrhea--trial of Lactaid. Consider bile acid salts in place of imodium if persists - Plan: CBC with Differential/Platelet, Comprehensive metabolic panel  Liver abscess - related to gangrenous gall bladder.  s/p chole, and s/p drains and IV ABX with resolution of abscess.  Chronic rhinitis - asking if she should restart meds.  Meds previously worked--advised it is up to her, if rhinorrhea is bothersome  CBC, c-met  I spent 47 minutes dedicated to the care of this patient, including pre-visit review of records, face to face time, post-visit ordering of testing and documentation.

## 2022-03-13 LAB — CBC WITH DIFFERENTIAL/PLATELET
Basophils Absolute: 0.1 10*3/uL (ref 0.0–0.2)
Basos: 1 %
EOS (ABSOLUTE): 0.2 10*3/uL (ref 0.0–0.4)
Eos: 4 %
Hematocrit: 40.9 % (ref 34.0–46.6)
Hemoglobin: 13.2 g/dL (ref 11.1–15.9)
Immature Grans (Abs): 0 10*3/uL (ref 0.0–0.1)
Immature Granulocytes: 0 %
Lymphocytes Absolute: 1.6 10*3/uL (ref 0.7–3.1)
Lymphs: 26 %
MCH: 29.3 pg (ref 26.6–33.0)
MCHC: 32.3 g/dL (ref 31.5–35.7)
MCV: 91 fL (ref 79–97)
Monocytes Absolute: 0.8 10*3/uL (ref 0.1–0.9)
Monocytes: 12 %
Neutrophils Absolute: 3.6 10*3/uL (ref 1.4–7.0)
Neutrophils: 57 %
Platelets: 291 10*3/uL (ref 150–450)
RBC: 4.51 x10E6/uL (ref 3.77–5.28)
RDW: 13.6 % (ref 11.7–15.4)
WBC: 6.3 10*3/uL (ref 3.4–10.8)

## 2022-03-13 LAB — COMPREHENSIVE METABOLIC PANEL
ALT: 21 IU/L (ref 0–32)
AST: 36 IU/L (ref 0–40)
Albumin/Globulin Ratio: 1.8 (ref 1.2–2.2)
Albumin: 3.5 g/dL — ABNORMAL LOW (ref 3.7–4.7)
Alkaline Phosphatase: 68 IU/L (ref 44–121)
BUN/Creatinine Ratio: 11 — ABNORMAL LOW (ref 12–28)
BUN: 7 mg/dL — ABNORMAL LOW (ref 8–27)
Bilirubin Total: 0.4 mg/dL (ref 0.0–1.2)
CO2: 26 mmol/L (ref 20–29)
Calcium: 9.2 mg/dL (ref 8.7–10.3)
Chloride: 102 mmol/L (ref 96–106)
Creatinine, Ser: 0.65 mg/dL (ref 0.57–1.00)
Globulin, Total: 2 g/dL (ref 1.5–4.5)
Glucose: 112 mg/dL — ABNORMAL HIGH (ref 70–99)
Potassium: 4.5 mmol/L (ref 3.5–5.2)
Sodium: 140 mmol/L (ref 134–144)
Total Protein: 5.5 g/dL — ABNORMAL LOW (ref 6.0–8.5)
eGFR: 86 mL/min/{1.73_m2} (ref >59)

## 2022-03-16 ENCOUNTER — Telehealth: Payer: Self-pay

## 2022-03-16 NOTE — Telephone Encounter (Signed)
Spoke with patient, relayed that per Dr. Juleen China no need for antibiotics or delayed timeframe for dental work due to recent gallbladder surgery.   She reports she was told to take amoxicillin prior to dental work. Advised her to discuss that recommendation with the provider who prescribed it.   Beryle Flock, RN

## 2022-03-16 NOTE — Telephone Encounter (Signed)
Patient called, reports she chipped a tooth and would like to have dental work done some time in the next week.   Reports she remembers being told after her shoulder surgery to avoid any dental work for 3 months. She recently had her gallbladder removed 01/27/22 and would like to know if it is okay to proceed with dental work this soon and wonders if she needs any prophylactic antibiotics. Will route to provider.   Beryle Flock, RN

## 2022-03-18 ENCOUNTER — Other Ambulatory Visit (INDEPENDENT_AMBULATORY_CARE_PROVIDER_SITE_OTHER): Payer: Medicare Other

## 2022-03-18 ENCOUNTER — Telehealth: Payer: Self-pay | Admitting: Family Medicine

## 2022-03-18 DIAGNOSIS — Z23 Encounter for immunization: Secondary | ICD-10-CM | POA: Diagnosis not present

## 2022-03-18 NOTE — Telephone Encounter (Signed)
Alexis Hines is coming in today for her covid shot and she also wants to know if she is due for her pneumonia shot?

## 2022-03-18 NOTE — Telephone Encounter (Signed)
She is not due for any pneumonia vaccines currently.  (She last had a pneumovax in 08/2021, and shouldn't get Prevnar-20 until it has been a year)

## 2022-03-19 ENCOUNTER — Telehealth: Payer: Self-pay | Admitting: *Deleted

## 2022-03-19 NOTE — Telephone Encounter (Signed)
If no fever, and no significant abdominal pain, she should be fine. Should be seen if she develops fever, worsening abdominal pain, blood in the stool.  Remind her to stick with a bland diet today (chicken noodle soup, toast, rice, bananas), no dairy for a couple of days.

## 2022-03-19 NOTE — Telephone Encounter (Signed)
Patient advised of Dr.Knapp's recommendations. She is not having any fevers or worsening abdominal pain.

## 2022-03-19 NOTE — Telephone Encounter (Signed)
Patient called, she came in for covid vaccine yesterday. She vomited last night. Wanted you to know the history and wanted to know if there is any reason for concern. Has not taken imodium x 3 days. Taking lactaid before meals. Yesterday 10am had some diarrhea, took imodium @ 11:30am and stomach hurt all day. 10pm had diarrhea again and vomited. She admits to having fried pumpkin ravioli for dinner. Took ondansetron but still vomited. Finally went to sleep '@2am'$  and woke up today @ 8am. Feels jusy okay today, not great-has a slight HA. Should she be concerned?

## 2022-03-22 ENCOUNTER — Other Ambulatory Visit: Payer: Self-pay | Admitting: Family Medicine

## 2022-04-03 ENCOUNTER — Telehealth: Payer: Self-pay | Admitting: *Deleted

## 2022-04-03 NOTE — Progress Notes (Unsigned)
  Care Coordination Note  04/03/2022 Name: [name] MRN: [id number] DOB: April 02, 1933  Alexis Hines is a 88 y.o. year old female who is a primary care patient of Rita Ohara, MD and is actively engaged with the care management team. I reached out to Olen Cordial by phone today to assist with re-scheduling a follow up visit with the RN Case Manager  Follow up plan: Unsuccessful telephone outreach attempt made. A HIPAA compliant phone message was left for the patient providing contact information and requesting a return call.   [place]  Direct Dial: [phone]

## 2022-04-07 NOTE — Progress Notes (Signed)
  Care Coordination Note  04/07/2022 Name: Alexis Hines MRN: [id number] DOB: March 23, 1934  [name] is a 89 y.o. year old female who is a primary care patient of Rita Ohara, MD and is actively engaged with the care management team. I reached out to Olen Cordial by phone today to assist with re-scheduling a follow up visit with the RN Case Manager  Follow up plan: A third unsuccessful telephone outreach attempt made. A HIPAA compliant phone message was left for the patient providing contact information and requesting a return call. We have been unable to make contact with the patient for follow up. The care management team is available to follow up with the patient after provider conversation with the patient regarding recommendation for care management engagement and subsequent re-referral to the care management team.   [place]  Direct Dial: [phone]

## 2022-04-10 DIAGNOSIS — E785 Hyperlipidemia, unspecified: Secondary | ICD-10-CM | POA: Diagnosis not present

## 2022-04-20 DIAGNOSIS — E785 Hyperlipidemia, unspecified: Secondary | ICD-10-CM | POA: Diagnosis not present

## 2022-04-20 DIAGNOSIS — I25119 Atherosclerotic heart disease of native coronary artery with unspecified angina pectoris: Secondary | ICD-10-CM | POA: Diagnosis not present

## 2022-04-20 DIAGNOSIS — R945 Abnormal results of liver function studies: Secondary | ICD-10-CM | POA: Diagnosis not present

## 2022-04-20 DIAGNOSIS — Z9049 Acquired absence of other specified parts of digestive tract: Secondary | ICD-10-CM | POA: Diagnosis not present

## 2022-04-20 DIAGNOSIS — Z7189 Other specified counseling: Secondary | ICD-10-CM | POA: Diagnosis not present

## 2022-04-20 DIAGNOSIS — I471 Supraventricular tachycardia, unspecified: Secondary | ICD-10-CM | POA: Diagnosis not present

## 2022-04-20 DIAGNOSIS — Z0001 Encounter for general adult medical examination with abnormal findings: Secondary | ICD-10-CM | POA: Diagnosis not present

## 2022-04-20 DIAGNOSIS — Z1389 Encounter for screening for other disorder: Secondary | ICD-10-CM | POA: Diagnosis not present

## 2022-04-28 DIAGNOSIS — R945 Abnormal results of liver function studies: Secondary | ICD-10-CM | POA: Diagnosis not present

## 2022-04-28 DIAGNOSIS — R195 Other fecal abnormalities: Secondary | ICD-10-CM | POA: Diagnosis not present

## 2022-05-08 DIAGNOSIS — E785 Hyperlipidemia, unspecified: Secondary | ICD-10-CM | POA: Diagnosis not present

## 2022-05-08 DIAGNOSIS — I471 Supraventricular tachycardia, unspecified: Secondary | ICD-10-CM | POA: Diagnosis not present

## 2022-05-08 DIAGNOSIS — L729 Follicular cyst of the skin and subcutaneous tissue, unspecified: Secondary | ICD-10-CM | POA: Diagnosis not present

## 2022-05-08 DIAGNOSIS — Z9049 Acquired absence of other specified parts of digestive tract: Secondary | ICD-10-CM | POA: Diagnosis not present

## 2022-05-08 DIAGNOSIS — I25119 Atherosclerotic heart disease of native coronary artery with unspecified angina pectoris: Secondary | ICD-10-CM | POA: Diagnosis not present

## 2022-05-08 DIAGNOSIS — R945 Abnormal results of liver function studies: Secondary | ICD-10-CM | POA: Diagnosis not present

## 2022-05-08 LAB — LAB REPORT - SCANNED: EGFR: 60

## 2022-05-12 DIAGNOSIS — R197 Diarrhea, unspecified: Secondary | ICD-10-CM | POA: Diagnosis not present

## 2022-05-12 DIAGNOSIS — R194 Change in bowel habit: Secondary | ICD-10-CM | POA: Diagnosis not present

## 2022-05-12 DIAGNOSIS — R7989 Other specified abnormal findings of blood chemistry: Secondary | ICD-10-CM | POA: Diagnosis not present

## 2022-05-12 DIAGNOSIS — R634 Abnormal weight loss: Secondary | ICD-10-CM | POA: Diagnosis not present

## 2022-05-14 DIAGNOSIS — K5900 Constipation, unspecified: Secondary | ICD-10-CM | POA: Diagnosis not present

## 2022-05-14 DIAGNOSIS — Z9049 Acquired absence of other specified parts of digestive tract: Secondary | ICD-10-CM | POA: Diagnosis not present

## 2022-05-14 DIAGNOSIS — Z7189 Other specified counseling: Secondary | ICD-10-CM | POA: Diagnosis not present

## 2022-05-14 DIAGNOSIS — E785 Hyperlipidemia, unspecified: Secondary | ICD-10-CM | POA: Diagnosis not present

## 2022-05-14 DIAGNOSIS — R945 Abnormal results of liver function studies: Secondary | ICD-10-CM | POA: Diagnosis not present

## 2022-05-14 DIAGNOSIS — I471 Supraventricular tachycardia, unspecified: Secondary | ICD-10-CM | POA: Diagnosis not present

## 2022-05-14 DIAGNOSIS — I25119 Atherosclerotic heart disease of native coronary artery with unspecified angina pectoris: Secondary | ICD-10-CM | POA: Diagnosis not present

## 2022-05-14 DIAGNOSIS — Z1389 Encounter for screening for other disorder: Secondary | ICD-10-CM | POA: Diagnosis not present

## 2022-05-21 DIAGNOSIS — R94118 Abnormal results of other function studies of eye: Secondary | ICD-10-CM | POA: Diagnosis not present

## 2022-05-21 DIAGNOSIS — K838 Other specified diseases of biliary tract: Secondary | ICD-10-CM | POA: Diagnosis not present

## 2022-05-21 DIAGNOSIS — N281 Cyst of kidney, acquired: Secondary | ICD-10-CM | POA: Diagnosis not present

## 2022-05-21 DIAGNOSIS — R945 Abnormal results of liver function studies: Secondary | ICD-10-CM | POA: Diagnosis not present

## 2022-06-12 DIAGNOSIS — R933 Abnormal findings on diagnostic imaging of other parts of digestive tract: Secondary | ICD-10-CM | POA: Diagnosis not present

## 2022-06-12 DIAGNOSIS — R634 Abnormal weight loss: Secondary | ICD-10-CM | POA: Diagnosis not present

## 2022-06-12 DIAGNOSIS — R7989 Other specified abnormal findings of blood chemistry: Secondary | ICD-10-CM | POA: Diagnosis not present

## 2022-06-19 ENCOUNTER — Other Ambulatory Visit: Payer: Self-pay | Admitting: Family Medicine

## 2022-07-02 DIAGNOSIS — R7989 Other specified abnormal findings of blood chemistry: Secondary | ICD-10-CM | POA: Diagnosis not present

## 2022-07-02 DIAGNOSIS — R933 Abnormal findings on diagnostic imaging of other parts of digestive tract: Secondary | ICD-10-CM | POA: Diagnosis not present

## 2022-07-02 DIAGNOSIS — R945 Abnormal results of liver function studies: Secondary | ICD-10-CM | POA: Diagnosis not present

## 2022-07-09 DIAGNOSIS — R7989 Other specified abnormal findings of blood chemistry: Secondary | ICD-10-CM | POA: Diagnosis not present

## 2022-07-09 DIAGNOSIS — R933 Abnormal findings on diagnostic imaging of other parts of digestive tract: Secondary | ICD-10-CM | POA: Diagnosis not present

## 2022-07-10 DIAGNOSIS — I471 Supraventricular tachycardia, unspecified: Secondary | ICD-10-CM | POA: Diagnosis not present

## 2022-07-10 DIAGNOSIS — K831 Obstruction of bile duct: Secondary | ICD-10-CM | POA: Diagnosis not present

## 2022-07-10 DIAGNOSIS — K5900 Constipation, unspecified: Secondary | ICD-10-CM | POA: Diagnosis not present

## 2022-07-10 DIAGNOSIS — M25519 Pain in unspecified shoulder: Secondary | ICD-10-CM | POA: Diagnosis not present

## 2022-07-10 DIAGNOSIS — R945 Abnormal results of liver function studies: Secondary | ICD-10-CM | POA: Diagnosis not present

## 2022-07-10 DIAGNOSIS — I25119 Atherosclerotic heart disease of native coronary artery with unspecified angina pectoris: Secondary | ICD-10-CM | POA: Diagnosis not present

## 2022-07-10 DIAGNOSIS — Z9049 Acquired absence of other specified parts of digestive tract: Secondary | ICD-10-CM | POA: Diagnosis not present

## 2022-07-10 DIAGNOSIS — E785 Hyperlipidemia, unspecified: Secondary | ICD-10-CM | POA: Diagnosis not present

## 2022-07-14 DIAGNOSIS — E7800 Pure hypercholesterolemia, unspecified: Secondary | ICD-10-CM | POA: Diagnosis not present

## 2022-07-14 DIAGNOSIS — K831 Obstruction of bile duct: Secondary | ICD-10-CM | POA: Diagnosis not present

## 2022-07-14 DIAGNOSIS — K5710 Diverticulosis of small intestine without perforation or abscess without bleeding: Secondary | ICD-10-CM | POA: Diagnosis not present

## 2022-07-14 DIAGNOSIS — Z9049 Acquired absence of other specified parts of digestive tract: Secondary | ICD-10-CM | POA: Diagnosis not present

## 2022-07-14 DIAGNOSIS — K838 Other specified diseases of biliary tract: Secondary | ICD-10-CM | POA: Diagnosis not present

## 2022-07-14 DIAGNOSIS — R945 Abnormal results of liver function studies: Secondary | ICD-10-CM | POA: Diagnosis not present

## 2022-07-14 DIAGNOSIS — E785 Hyperlipidemia, unspecified: Secondary | ICD-10-CM | POA: Diagnosis not present

## 2022-07-20 DIAGNOSIS — E785 Hyperlipidemia, unspecified: Secondary | ICD-10-CM | POA: Diagnosis not present

## 2022-07-21 LAB — LAB REPORT - SCANNED: EGFR: 72

## 2022-07-30 DIAGNOSIS — M810 Age-related osteoporosis without current pathological fracture: Secondary | ICD-10-CM | POA: Diagnosis not present

## 2022-07-30 DIAGNOSIS — I739 Peripheral vascular disease, unspecified: Secondary | ICD-10-CM | POA: Diagnosis not present

## 2022-07-30 DIAGNOSIS — I25119 Atherosclerotic heart disease of native coronary artery with unspecified angina pectoris: Secondary | ICD-10-CM | POA: Diagnosis not present

## 2022-07-30 DIAGNOSIS — I110 Hypertensive heart disease with heart failure: Secondary | ICD-10-CM | POA: Diagnosis not present

## 2022-07-30 DIAGNOSIS — R945 Abnormal results of liver function studies: Secondary | ICD-10-CM | POA: Diagnosis not present

## 2022-07-30 DIAGNOSIS — I471 Supraventricular tachycardia, unspecified: Secondary | ICD-10-CM | POA: Diagnosis not present

## 2022-07-30 DIAGNOSIS — K5900 Constipation, unspecified: Secondary | ICD-10-CM | POA: Diagnosis not present

## 2022-07-30 DIAGNOSIS — E785 Hyperlipidemia, unspecified: Secondary | ICD-10-CM | POA: Diagnosis not present

## 2022-07-30 DIAGNOSIS — I6529 Occlusion and stenosis of unspecified carotid artery: Secondary | ICD-10-CM | POA: Diagnosis not present

## 2022-07-30 DIAGNOSIS — Z9049 Acquired absence of other specified parts of digestive tract: Secondary | ICD-10-CM | POA: Diagnosis not present

## 2022-08-03 DIAGNOSIS — K838 Other specified diseases of biliary tract: Secondary | ICD-10-CM | POA: Diagnosis not present

## 2022-08-03 DIAGNOSIS — I10 Essential (primary) hypertension: Secondary | ICD-10-CM | POA: Diagnosis not present

## 2022-08-05 DIAGNOSIS — R7989 Other specified abnormal findings of blood chemistry: Secondary | ICD-10-CM | POA: Diagnosis not present

## 2022-08-05 DIAGNOSIS — R933 Abnormal findings on diagnostic imaging of other parts of digestive tract: Secondary | ICD-10-CM | POA: Diagnosis not present

## 2022-09-02 NOTE — Progress Notes (Unsigned)
No chief complaint on file.       She is s/p cholecystectomy (10/31) for gangrenous cholecystitis and liver abscess. Intraoperative cultures  grew Klebsiella pneumonia and E coli.  She was discharged home on Augmentin, however, the Klebsiella susceptibilities came back with resistance to Augmentin.  She was readmitted 11/7 with postop infection/abscess. She underwent IR guided drain placement into the hepatic abscess on 02/04/2022. Cultures grew Klebsiella and E. Coli. Repeat CT 02/08/22 showed mild decrease in size of the majority of the perihepatic collections, surgical drain was removed 11/13. She was discharged home 11/15 with an IR drain, and PICC line to complete ceftriaxone 2gm daily and Flagyl 500mg  PO BID through 03/10/22.  She had f/u with IR on 03/05/22 at which time repeat CT showed resolution of the abscess.

## 2022-09-03 ENCOUNTER — Encounter: Payer: Self-pay | Admitting: Family Medicine

## 2022-09-03 ENCOUNTER — Ambulatory Visit (INDEPENDENT_AMBULATORY_CARE_PROVIDER_SITE_OTHER): Payer: Medicare Other | Admitting: Family Medicine

## 2022-09-03 VITALS — BP 160/86 | HR 72 | Ht <= 58 in | Wt 112.2 lb

## 2022-09-03 DIAGNOSIS — E46 Unspecified protein-calorie malnutrition: Secondary | ICD-10-CM | POA: Diagnosis not present

## 2022-09-03 DIAGNOSIS — R5383 Other fatigue: Secondary | ICD-10-CM

## 2022-09-03 DIAGNOSIS — I10 Essential (primary) hypertension: Secondary | ICD-10-CM

## 2022-09-03 DIAGNOSIS — E782 Mixed hyperlipidemia: Secondary | ICD-10-CM

## 2022-09-03 DIAGNOSIS — R7989 Other specified abnormal findings of blood chemistry: Secondary | ICD-10-CM

## 2022-09-03 DIAGNOSIS — J310 Chronic rhinitis: Secondary | ICD-10-CM | POA: Diagnosis not present

## 2022-09-03 HISTORY — DX: Essential (primary) hypertension: I10

## 2022-09-03 MED ORDER — IPRATROPIUM BROMIDE 0.06 % NA SOLN: NASAL | 1 refills | Status: DC

## 2022-09-03 NOTE — Assessment & Plan Note (Signed)
Stopped atorvastatin related to elevated LFT's. These have gone up and down.  Last check after ERCP normal. Recheck today.  Consider restarting if labs remain normal.

## 2022-09-03 NOTE — Patient Instructions (Signed)
  Please continue to limit the sodium in your diet. Periodically check your blood pressure at home--sometimes they can be higher in the office related to being nervous.  Bring your list of home blood pressures to your visit with the cardiologist.  If they all remain elevated, he may make adjustments.  We are rechecking your liver tests today. If they remain normal, we will let you know to restart the atorvastatin.  I recommend using nasal saline spray. There is a lot of crusting in the nose.  You can even consider doing a sinus rinse if having more congestion.

## 2022-09-03 NOTE — Assessment & Plan Note (Signed)
BP elevated in office. Some improvement on recheck.  Suspect anxiety contributing. Encouraged her to monitor BP's at home, and to bring her list of BP's to her upcoming appointment with Dr. Bjorn Pippin. Continue metoprolol and low sodium diet. Continue exercise as tolerated

## 2022-09-03 NOTE — Assessment & Plan Note (Signed)
Elevated LFT's s/p cholecystectomy, c/b abscess. She had MRCP and ERCP in [place]. 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 Last AST, ALT normal, alk phos mildly elevated at 150. Due for recheck

## 2022-09-03 NOTE — Assessment & Plan Note (Signed)
Restart atrovent nasal spray.  If not adequately controlled, can resume zyrtec and/or flonase

## 2022-09-03 NOTE — Assessment & Plan Note (Signed)
Related to surgery and GI issues.  She is eating better, and drinking a high protein Ensure daily.  Regaining weight.  She reports fatigue--suspect some deconditioning--still quite active. Has f/u with cardiologist soon.

## 2022-09-04 ENCOUNTER — Other Ambulatory Visit: Payer: Medicare Other

## 2022-09-04 DIAGNOSIS — E46 Unspecified protein-calorie malnutrition: Secondary | ICD-10-CM

## 2022-09-04 DIAGNOSIS — I10 Essential (primary) hypertension: Secondary | ICD-10-CM

## 2022-09-04 DIAGNOSIS — R5383 Other fatigue: Secondary | ICD-10-CM

## 2022-09-04 DIAGNOSIS — R7989 Other specified abnormal findings of blood chemistry: Secondary | ICD-10-CM

## 2022-09-04 LAB — VITAMIN D 25 HYDROXY (VIT D DEFICIENCY, FRACTURES)

## 2022-09-05 LAB — COMPREHENSIVE METABOLIC PANEL
ALT: 18 IU/L (ref 0–32)
AST: 26 IU/L (ref 0–40)
Albumin/Globulin Ratio: 1.6 (ref 1.2–2.2)
Albumin: 3.9 g/dL (ref 3.7–4.7)
Alkaline Phosphatase: 100 IU/L (ref 44–121)
BUN/Creatinine Ratio: 31 — ABNORMAL HIGH (ref 12–28)
BUN: 26 mg/dL (ref 8–27)
Bilirubin Total: 0.5 mg/dL (ref 0.0–1.2)
CO2: 27 mmol/L (ref 20–29)
Calcium: 9.9 mg/dL (ref 8.7–10.3)
Chloride: 101 mmol/L (ref 96–106)
Creatinine, Ser: 0.83 mg/dL (ref 0.57–1.00)
Globulin, Total: 2.4 g/dL (ref 1.5–4.5)
Glucose: 59 mg/dL — ABNORMAL LOW (ref 70–99)
Potassium: 4.6 mmol/L (ref 3.5–5.2)
Sodium: 142 mmol/L (ref 134–144)
Total Protein: 6.3 g/dL (ref 6.0–8.5)
eGFR: 69 mL/min/{1.73_m2} (ref >59)

## 2022-09-05 LAB — BILIRUBIN, FRACTIONATED(TOT/DIR/INDIR)
Bilirubin, Direct: 0.13 mg/dL (ref 0.00–0.40)
Bilirubin, Indirect: 0.37 mg/dL (ref 0.10–0.80)

## 2022-09-07 ENCOUNTER — Telehealth: Payer: Self-pay | Admitting: *Deleted

## 2022-09-07 NOTE — Telephone Encounter (Signed)
No.  She was asking about having vitamin levels checked.  Advised they weren't needed (based on normal CBC's she had brought in). We checked a vitamin D and it was normal. Just continue her current vitamins.  We talked about giving herself some grace, not pushing things with her exercise, taking things slowly coming back from all of her stomach issues

## 2022-09-07 NOTE — Telephone Encounter (Signed)
I gave patient results of labs today. She called back and stated that she also thought you were going to suggest some vitamins to take based on her labs that she might need. She is very fatigued.

## 2022-09-07 NOTE — Telephone Encounter (Signed)
Patient advised.

## 2022-09-14 ENCOUNTER — Telehealth: Payer: Self-pay | Admitting: Family Medicine

## 2022-09-14 NOTE — Telephone Encounter (Signed)
Pt advised.

## 2022-09-14 NOTE — Telephone Encounter (Signed)
No reasons she can't take Vitamin C with Zinc that I'm aware of. Please advise

## 2022-09-14 NOTE — Telephone Encounter (Signed)
Pt wants to make sure it is ok for her to take Vitamin C with Zinc for her hair?  She already takes biotin but was told this would help with her hair loss and she wanted to clear it with you

## 2022-09-16 NOTE — Progress Notes (Unsigned)
Cardiology Office Note:    Date:  09/17/2022   ID:  Alexis Hines, DOB [birth year], MRN [id number]  PCP:  Joselyn Arrow, MD  Cardiologist:  Little Ishikawa, MD  Electrophysiologist:  None   Referring MD: Joselyn Arrow, MD   Chief Complaint  Patient presents with   Palpitations    History of Present Illness:    Alexis Hines is a 89 y.o. female with a hx of basal cell cancer, squamous cell carcinoma of skin, hyperlipidemia who presents for follow-up appointment. She was referred by Dr. Lynelle Doctor for evaluation of EKG abnormalities and preop evaluation, initially seen on 08/24/2019.  Plan for hip surgery on 08/29/2019.  Preoperative EKG done at Dr. Delford Field office 5/24 was concerning for AV dissociation and accelerated junctional rhythm.  On my review, appears to be sinus rhythm with frequent PACs.  Underwent hip surgery 10/10/19, no complications.  At initial clinic visit, she reported having palpitations and episodes of lightheadedness that lasted less than 1 minute.  She underwent a nuclear stress test in [place] on 07/05/2017, which showed normal EF, no perfusion defects.  Echocardiogram on 08/31/2019 showed LVEF 70-75%, grade 1 diastolic function, normal RV function, mild RV enlargement, degenerative mitral valve with mild MR. Zio patch x3 days on 09/14/2019 showed frequent PACs (11% of beats), with frequent episodes of SVT lasting up to 20 seconds.  Started on metoprolol.  Zio patch x3 days on 03/11/2021 on metoprolol showed 412 episodes of SVT with longest lasting 12 beats, frequent PACs (8.4% of beats with frequent supraventricular couplets (8.3%) and occasional supraventricular triplets (3.3%).  Echocardiogram 08/18/2021 showed EF 65 to 70%, grade 1 diastolic dysfunction, normal RV function, severe biatrial enlargement, mild to moderate tricuspid regurgitation, mild aortic regurgitation.  Since last clinic visit, she reports she had a difficult year.  She had cholecystitis and underwent  cholecystectomy in October.  Completed 1 month of IV antibiotics.  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  Denies any chest pain, dyspnea, lightheadedness, syncope, lower extremity edema.  Reports no recent palpitations.  Weight is down 11 pounds from last clinic visit.  She continues to exercise, does class 4 days/week, denies any exertional symptoms.  Wt Readings from Last 3 Encounters:  09/17/22 113 lb 3.2 oz (51.3 kg)  09/03/22 112 lb 3.2 oz (50.9 kg)  03/12/22 114 lb (51.7 kg)      Past Medical History:  Diagnosis Date   Abnormal TSH 2004   normal since   Arthritis    Basal cell cancer    nose; Dr. Terri Piedra    Diverticulosis 10/2005   Dizziness    Essential hypertension 09/03/2022   Migraine childhood   resolved   PAC (premature atrial contraction)    frequent on cardiac monitor 09/14/19   Pure hypercholesterolemia    Radial styloid tenosynovitis (de quervain) 11/2018   injected by Dr. Merlyn Lot 11/2018, 02/2019   SCCA (squamous cell carcinoma) of skin 12/2015   right temple   Squamous cell carcinoma of skin 02/15/2017   in situ-left jaw (CX35FU)    Past Surgical History:  Procedure Laterality Date   BREAST BIOPSY Left 1984   benign   CHOLECYSTECTOMY N/A 01/27/2022   Procedure: LAPAROSCOPIC CHOLECYSTECTOMY;  Surgeon: Quentin Ore, MD;  Location: [facility];  Service: General;  Laterality: N/A;   EYE SURGERY     Bil and eyelids lifted   IR RADIOLOGIST EVAL & MGMT  03/05/2022   left hip intra-articular steriod injection  03/13/2014  Mohs procedure     REVERSE SHOULDER ARTHROPLASTY Left 12/26/2020   Procedure: REVERSE SHOULDER ARTHROPLASTY;  Surgeon: Francena Hanly, MD;  Location: [facility];  Service: Orthopedics;  Laterality: Left;    TONSILLECTOMY  age 45   TOTAL HIP ARTHROPLASTY Right 10/10/2019   Procedure: TOTAL HIP ARTHROPLASTY ANTERIOR APPROACH;  Surgeon: Durene Romans, MD;  Location: [facility];  Service: Orthopedics;  Laterality: Right;  72  mins   TOTAL KNEE ARTHROPLASTY Left 07/2003   Dr. Thomasena Edis   TOTAL KNEE ARTHROPLASTY Right 09/03/2017   Procedure: RIGHT TOTAL KNEE ARTHROPLASTY;  Surgeon: Eugenia Mcalpine, MD;  Location: [facility];  Service: Orthopedics;  Laterality: Right;    Current Medications: Current Meds  Medication Sig   atorvastatin (LIPITOR) 40 MG tablet Take 40 mg by mouth daily.   Biotin 45409 MCG TABS Take 10,000 mcg by mouth in the morning.   Calcium Carbonate-Vitamin D (CALTRATE 600+D PO) Take 1 tablet by mouth daily.   ipratropium (ATROVENT) 0.06 % nasal spray Use 2 sprays in each nostril 3 to 4 times daily for runny nose   metoprolol succinate (TOPROL-XL) 50 MG 24 hr tablet Take 1 tablet (50 mg total) by mouth daily.   Multiple Vitamin (MULTIVITAMIN WITH MINERALS) TABS tablet Take 1 tablet by mouth daily with breakfast. CENTRUM MULTIVITAMIN   PREVIDENT 5000 BOOSTER PLUS 1.1 % PSTE Place 1 application  onto teeth every 3 (three) days.   Probiotic Product (PROBIOTIC PO) Take 1 capsule by mouth daily.   Wheat Dextrin (BENEFIBER PO) Take 8-10 g by mouth See admin instructions. Mix 8-10 grams (2 teaspoonsful) of powder into applesauce or the suggested amount of warm water and consume 2 times a day     Allergies:   Patient has no known allergies.   Social History   Socioeconomic History   Marital status: Widowed    Spouse name: Not on file   Number of children: 2   Years of education: Not on file   Highest education level: Not on file  Occupational History   Occupation: Retired Retail buyer  Tobacco Use   Smoking status: Never   Smokeless tobacco: Never  Vaping Use   Vaping Use: Never used  Substance and Sexual Activity   Alcohol use: Yes    Comment: occasional glass of wine.   Drug use: No   Sexual activity: Not Currently  Other Topics Concern   Not on file  Social History Narrative   Widowed. Lives 1/2 the year in [place]. Her husband (retired Librarian, academic) died from liver cancer in 05-05-2011.  Daughter  lives in [place]; 2 grandchildren here, and 2 in [place]   Social Determinants of Health   Financial Resource Strain: Not on file  Food Insecurity: No Food Insecurity (02/03/2022)   Hunger Vital Sign    Worried About Running Out of Food in the Last Year: Never true    Ran Out of Food in the Last Year: Never true  Transportation Needs: No Transportation Needs (02/17/2022)   PRAPARE - Administrator, Civil Service (Medical): No    Lack of Transportation (Non-Medical): No  Physical Activity: Not on file  Stress: Not on file  Social Connections: Not on file     Family History: The patient's family history includes Cancer in her sister; Cancer (age of onset: 77) in her sister; Gout in her son; Heart disease (age of onset: 71) in her brother; Hyperlipidemia in her brother; Rheum arthritis in her daughter.  ROS:  Please see the history of present illness.     All other systems reviewed and are negative.  EKGs/Labs/Other Studies Reviewed:    The following studies were reviewed today:   EKG:   09/17/22: Normal sinus rhythm, rate 78, no PACs 08/06/21: Normal sinus rhythm, rate 65, no ST abnormality 02/27/21: Sinus rhythm, PACs, rate 62  Recent Labs: 02/04/2022: Magnesium 2.0 03/12/2022: Hemoglobin 13.2; Platelets 291 09/04/2022: ALT 18; BUN 26; Creatinine, Ser 0.83; Potassium 4.6; Sodium 142  Recent Lipid Panel    Component Value Date/Time   CHOL 181 11/09/2019 0947   TRIG 117 11/09/2019 0947   HDL 62 11/09/2019 0947   CHOLHDL 2.9 11/09/2019 0947   CHOLHDL 1.9 12/23/2016 0816   VLDL 26 12/26/2015 0706   LDLCALC 98 11/09/2019 0947   LDLCALC 65 12/23/2016 0816    Physical Exam:    VS:  BP 136/68 (BP Location: Left Arm, Patient Position: Sitting, Cuff Size: Normal)   Pulse 78   Ht 4' 10.5" (1.486 m)   Wt 113 lb 3.2 oz (51.3 kg)   SpO2 94%   BMI 23.26 kg/m     Wt Readings from Last 3 Encounters:  09/17/22 113 lb 3.2 oz (51.3 kg)  09/03/22 112 lb 3.2 oz  (50.9 kg)  03/12/22 114 lb (51.7 kg)     GEN:  in no acute distress HEENT: Normal NECK: No JVD; No carotid bruits LYMPHATICS: No lymphadenopathy CARDIAC: irregular, tachycardic, no murmurs RESPIRATORY:  Clear to auscultation without rales, wheezing or rhonchi  ABDOMEN: Soft, non-tender, non-distended MUSCULOSKELETAL:  No edema; No deformity  SKIN: Warm and dry NEUROLOGIC:  Alert and oriented x 3 PSYCHIATRIC:  Normal affect   ASSESSMENT:    1. Palpitations   2. Abnormal EKG   3. Lightheadedness   4. SVT (supraventricular tachycardia)     PLAN:     Lightheadedness/near syncope/palpitations: Frequent PACs/runs of SVT on EKG. Zio patch x3 days on 09/14/2019 showed frequent PACs (11% of beats), with frequent episodes of SVT lasting up to 20 seconds.  Echocardiogram on 08/31/2019 showed LVEF 70-75%, grade 1 diastolic function, normal RV function, mild RV enlargement, degenerative mitral valve with mild MR.  Started on metoprolol.  Zio patch x3 days on 03/11/2021 on metoprolol showed 412 episodes of SVT with longest lasting 12 beats, frequent PACs (8.4% of beats with frequent supraventricular couplets (8.3%) and occasional supraventricular triplets (3.3%).  Echocardiogram 08/18/2021 showed EF 65 to 70%, grade 1 diastolic dysfunction, normal RV function, severe biatrial enlargement, mild to moderate tricuspid regurgitation, mild aortic regurgitation. -Continue Toprol-XL 50 mg daily, reports no recent palpitations  RTC in 1 year   Medication Adjustments/Labs and Tests Ordered: Current medicines are reviewed at length with the patient today.  Concerns regarding medicines are outlined above.  Orders Placed This Encounter  Procedures   EKG 12-Lead    No orders of the defined types were placed in this encounter.    Patient Instructions  Is   Signed, Little Ishikawa, MD  09/17/2022 1:54 PM    [facility]

## 2022-09-17 ENCOUNTER — Other Ambulatory Visit: Payer: Self-pay

## 2022-09-17 ENCOUNTER — Encounter: Payer: Self-pay | Admitting: Cardiology

## 2022-09-17 ENCOUNTER — Ambulatory Visit: Payer: Medicare Other | Attending: Cardiology | Admitting: Cardiology

## 2022-09-17 VITALS — BP 136/68 | HR 78 | Ht 58.5 in | Wt 113.2 lb

## 2022-09-17 DIAGNOSIS — R002 Palpitations: Secondary | ICD-10-CM | POA: Diagnosis not present

## 2022-09-17 DIAGNOSIS — R42 Dizziness and giddiness: Secondary | ICD-10-CM | POA: Diagnosis not present

## 2022-09-17 DIAGNOSIS — I471 Supraventricular tachycardia, unspecified: Secondary | ICD-10-CM | POA: Diagnosis not present

## 2022-09-17 DIAGNOSIS — R9431 Abnormal electrocardiogram [ECG] [EKG]: Secondary | ICD-10-CM

## 2022-09-17 NOTE — Patient Instructions (Addendum)
Medication Instructions:  No changes *If you need a refill on your cardiac medications before your next appointment, please call your pharmacy*  Follow-Up: At [facility], you and your health needs are our priority.  As part of our continuing mission to provide you with exceptional heart care, we have created designated Provider Care Teams.  These Care Teams include your primary Cardiologist (physician) and Advanced Practice Providers (APPs -  Physician Assistants and Nurse Practitioners) who all work together to provide you with the care you need, when you need it.  We recommend signing up for the patient portal called "MyChart".  Sign up information is provided on this After Visit Summary.  MyChart is used to connect with patients for Virtual Visits (Telemedicine).  Patients are able to view lab/test results, encounter notes, upcoming appointments, etc.  Non-urgent messages can be sent to your provider as well.   To learn more about what you can do with MyChart, go to [email or web address].    Your next appointment:   1 year(s)  Provider:   Little Ishikawa, MD

## 2022-10-07 ENCOUNTER — Telehealth: Payer: Self-pay | Admitting: Family Medicine

## 2022-10-07 NOTE — Telephone Encounter (Signed)
You can let her know that this is perfectly fine.

## 2022-10-07 NOTE — Telephone Encounter (Signed)
Pt called she states due to her constant runny nose she started flonase yesterday, started zyrtec today this is in addition to her atrovent that she normally takes along with her Vitamin C with zinc  She wants to make sure all of this is ok with you

## 2022-10-08 NOTE — Telephone Encounter (Signed)
Pt informed

## 2022-10-20 ENCOUNTER — Telehealth: Payer: Self-pay | Admitting: *Deleted

## 2022-10-20 NOTE — Progress Notes (Unsigned)
  Care Coordination Note  10/20/2022 Name: Alexis Hines MRN: [id number] DOB: May 13, 1933  Alexis Hines is a 89 y.o. year old female who is a primary care patient of Joselyn Arrow, MD and is actively engaged with the care management team. I reached out to Robyn Haber by phone today to assist with scheduling a follow up visit with the RN Case Manager  Follow up plan: Unsuccessful telephone outreach attempt made. A HIPAA compliant phone message was left for the patient providing contact information and requesting a return call.   [facility]  Care Coordination Care Guide  Direct Dial: [phone]

## 2022-10-21 NOTE — Progress Notes (Signed)
  Care Coordination Note  10/21/2022 Name: [name] MRN: [id number] DOB: 1934/01/31  Alexis Hines is a 89 y.o. year old female who is a primary care patient of Joselyn Arrow, MD and is actively engaged with the care management team. I reached out to Robyn Haber by phone today to assist with re-scheduling a follow up visit with the RN Case Manager  Follow up plan: Patient declines further follow up and engagement by the care management team. Appropriate care team members and provider have been notified via electronic communication.   [facility]  Care Coordination Care Guide  Direct Dial: [phone]

## 2022-10-21 NOTE — Progress Notes (Signed)
  Care Coordination Note  10/21/2022 Name: Alexis Hines MRN: [id number] DOB: [birth year]  Alexis Hines is a 89 y.o. year old female who is a primary care patient of Joselyn Arrow, MD and is actively engaged with the care management team. I reached out to Robyn Haber by phone today to assist with re-scheduling a follow up visit with the RN Case Manager  Follow up plan: Unsuccessful telephone outreach attempt made. A HIPAA compliant phone message was left for the patient providing contact information and requesting a return call.   [facility]  Care Coordination Care Guide  Direct Dial: [phone]   SIGNATURE

## 2022-11-04 ENCOUNTER — Telehealth: Payer: Self-pay | Admitting: *Deleted

## 2022-11-04 NOTE — Telephone Encounter (Signed)
Patient called to to ask if she should stop taking zyrtec and flonase as she has been taking for 28 days ans not really helped with runny nose. She uses ipratropium and that helps some. Should she just d/c since doesn't seem to be helping? Or try something else?

## 2022-11-04 NOTE — Telephone Encounter (Signed)
Patient advised.

## 2022-11-04 NOTE — Telephone Encounter (Signed)
She can just continue with the ipratropium.  If it gets worse when she stops the other meds, she can then restart them

## 2022-11-17 ENCOUNTER — Telehealth: Payer: Self-pay | Admitting: Family Medicine

## 2022-11-17 ENCOUNTER — Other Ambulatory Visit: Payer: Self-pay | Admitting: Family Medicine

## 2022-11-17 NOTE — Telephone Encounter (Signed)
Thanks, noted

## 2022-11-17 NOTE — Telephone Encounter (Signed)
Pt called she does not need atorvastatin currently, she probably has month or 2 left

## 2022-11-25 ENCOUNTER — Telehealth: Payer: Self-pay | Admitting: Family Medicine

## 2022-11-25 ENCOUNTER — Encounter: Payer: Self-pay | Admitting: Family Medicine

## 2022-11-25 ENCOUNTER — Telehealth (INDEPENDENT_AMBULATORY_CARE_PROVIDER_SITE_OTHER): Payer: Medicare Other | Admitting: Family Medicine

## 2022-11-25 VITALS — Temp 97.7°F | Ht 58.5 in | Wt 110.0 lb

## 2022-11-25 DIAGNOSIS — U071 COVID-19: Secondary | ICD-10-CM | POA: Diagnosis not present

## 2022-11-25 MED ORDER — NIRMATRELVIR/RITONAVIR (PAXLOVID)TABLET
3.0000 | ORAL_TABLET | Freq: Two times a day (BID) | ORAL | 0 refills | Status: AC
Start: 2022-11-25 — End: 2022-11-30

## 2022-11-25 NOTE — Telephone Encounter (Signed)
Patient called  She has been on trip for 2 1/2 weeks she was seen at [facility] and test positive for covid on Monday They did not give

## 2022-11-25 NOTE — Progress Notes (Signed)
   Subjective:    Patient ID: Alexis Hines, female    DOB: [birth year], 89 y.o.   MRN: [id number]  HPI Documentation for virtual audio and video telecommunications through Caregility encounter: The patient was located at home. 2 patient identifiers used.  The provider was located in the office. The patient did consent to this visit and is aware of possible charges through their insurance for this visit. The other persons participating in this telemedicine service were none. Time spent on call was 5 minutes and in review of previous records >15 minutes total for counseling and coordination of care. This virtual service is not related to other E/M service within previous 7 days.  She states that on Sunday she developed a slight cough and sneezing and the next day got tested and was positive for COVID.  They gave her cough medicine but did not give her Paxil bid.  Presently she complains of coughing sneezing, rhinorrhea and weakness but no fever or chills.  She would like Paxlovid  Review of Systems     Objective:    Physical Exam Alert and in no distress otherwise not examined       Assessment & Plan:  COVID-19 - Plan: nirmatrelvir/ritonavir (PAXLOVID) 20 x 150 MG & 10 x 100MG  TABS Recommend Tylenol for the fever aches and pains.  Explained that once she starts to feel better she can then go out in public but to wear a mask for another 5 days.  Discussed worsening of fever, cough and shortness of breath as reasons to get further evaluation done.

## 2022-12-02 ENCOUNTER — Ambulatory Visit (INDEPENDENT_AMBULATORY_CARE_PROVIDER_SITE_OTHER): Payer: Medicare Other | Admitting: Medical

## 2022-12-02 VITALS — BP 110/70 | HR 80 | Wt 110.6 lb

## 2022-12-02 DIAGNOSIS — Z974 Presence of external hearing-aid: Secondary | ICD-10-CM

## 2022-12-02 DIAGNOSIS — H6123 Impacted cerumen, bilateral: Secondary | ICD-10-CM

## 2022-12-02 DIAGNOSIS — H938X3 Other specified disorders of ear, bilateral: Secondary | ICD-10-CM

## 2022-12-02 NOTE — Progress Notes (Signed)
Subjective:   Here for fullness of ears, possible wax.   Wears hearing aids but doesn't have them with her today  Also sometimes gets irritation in gum with partial, but feels better today  No other aggravating or relieving factors.  No other complaint.  Has some nasal congestion  No fever, no body aches or chills   Review of Systems Constitutional: denies fever, chills, sweats ENT: no runny nose, ear pain, sore throat, hoarseness, sinus pain, teeth pain, tinnitus, hearing loss Gastroenterology: denies nausea, vomiting     Objective:   Physical Exam  General appearance: alert, no distress, WD/WN Ears: bilat ear canal with impacted cerumen HENT: conjunctiva/corneas normal, sclerae anicteric, nares patent, no discharge or erythema, pharynx normal Oral cavity: MMM, tongue normal, teeth normal, no obvious ulcer or erythema of gums Neurological: Hearing normal bilaterally to whisper    Assessment & Plan:    Encounter Diagnoses  Name Primary?   Ear pressure, bilateral Yes   Impacted cerumen of both ears    Wears hearing aid      Discussed findings.  Discussed risk/benefits of procedure and patient agrees to procedure. Successfully used warm water lavage to remove impacted cerumen from bilat ear canal. Patient tolerated procedure well. Advised they avoid using any cotton swabs or other devices to clean the ear canals.  Use basic hygiene as discussed.    Reassured no obvious ulcer or wound of gums  Follow up prn.

## 2022-12-03 ENCOUNTER — Telehealth: Payer: Self-pay | Admitting: Cardiology

## 2022-12-03 NOTE — Telephone Encounter (Signed)
Pt c/o medication issue:  1. Name of Medication: metoprolol succinate (TOPROL-XL) 50 MG 24 hr tablet   2. How are you currently taking this medication (dosage and times per day)?    3. Are you having a reaction (difficulty breathing--STAT)? No   4. What is your medication issue? Patient is unsure if she already took this medication for today. She wants to know what effect will it cause if she takes it twice in one day or if she dont take none at all today. Please advise

## 2022-12-03 NOTE — Telephone Encounter (Signed)
If not sure if she took today, would just resume tomorrow

## 2022-12-03 NOTE — Telephone Encounter (Signed)
Returned call to patient. Pt can not remember if she took her metoprolol succinate today or not. She is wanting to know if she should take it now or not. BP now is 150/106 and the HR is 71. Pt does not know what it was yesterday. Please advise

## 2022-12-04 NOTE — Telephone Encounter (Signed)
Called patient left message on personal voice mail to call back. 

## 2022-12-07 ENCOUNTER — Ambulatory Visit (INDEPENDENT_AMBULATORY_CARE_PROVIDER_SITE_OTHER): Payer: Medicare Other | Admitting: Medical

## 2022-12-07 VITALS — BP 120/62 | HR 68 | Temp 98.3°F | Ht <= 58 in | Wt 111.0 lb

## 2022-12-07 DIAGNOSIS — I75023 Atheroembolism of bilateral lower extremities: Secondary | ICD-10-CM

## 2022-12-07 DIAGNOSIS — R0989 Other specified symptoms and signs involving the circulatory and respiratory systems: Secondary | ICD-10-CM

## 2022-12-07 DIAGNOSIS — R300 Dysuria: Secondary | ICD-10-CM | POA: Diagnosis not present

## 2022-12-07 LAB — POCT URINALYSIS DIP (PROADVANTAGE DEVICE)
Bilirubin, UA: NEGATIVE
Blood, UA: NEGATIVE
Glucose, UA: NEGATIVE mg/dL
Ketones, POC UA: NEGATIVE mg/dL
Nitrite, UA: NEGATIVE
Protein Ur, POC: NEGATIVE mg/dL
Specific Gravity, Urine: 1.01
Urobilinogen, Ur: 0.2
pH, UA: 6 (ref 5.0–8.0)

## 2022-12-07 NOTE — Progress Notes (Signed)
Subjective:  Alexis Hines is a 89 y.o. female who presents for Chief Complaint  Patient presents with   Urinary Tract Infection    Patient states that she is having burning with urination that started this weekend.      Here for possible UTI.  She notes 2 days of some burning with urination although not as bad today.  Some urgency but no frequency.  No cloudy urine.  No blood in urine.  No blood in urine.  No fever body aches or chills.  No new abdominal pain or back pain.  Last UTI several years ago.  No recent change in hygiene products.  Does not drink much caffeine.  No recent antibiotic use.  No vaginal discharge.  She was talking with her son who is a physician down in [place].  They were talking about her toes.  She has purplish toes.  She denies pain or pain with walking but does get purplish colors in her toes.  This concerned her.  She did have influenza infection 3 weeks ago  No other aggravating or relieving factors.    No other c/o.   Past Medical History:  Diagnosis Date   Abnormal TSH 2004   normal since   Arthritis    Basal cell cancer    nose; Dr. Terri Piedra    Diverticulosis 10/2005   Dizziness    Essential hypertension 09/03/2022   Migraine childhood   resolved   PAC (premature atrial contraction)    frequent on cardiac monitor 09/14/19   Pure hypercholesterolemia    Radial styloid tenosynovitis (de quervain) 11/2018   injected by Dr. Merlyn Lot 11/2018, 02/2019   SCCA (squamous cell carcinoma) of skin 12/2015   right temple   Squamous cell carcinoma of skin 02/15/2017   in situ-left jaw (CX35FU)   Current Outpatient Medications on File Prior to Visit  Medication Sig Dispense Refill   atorvastatin (LIPITOR) 40 MG tablet Take 40 mg by mouth daily.     Biotin 16109 MCG TABS Take 10,000 mcg by mouth in the morning.     Calcium Carbonate-Vitamin D (CALTRATE 600+D PO) Take 1 tablet by mouth daily.     ipratropium (ATROVENT) 0.06 % nasal spray Use 2 sprays in  each nostril 3 to 4 times daily for runny nose 45 mL 1   metoprolol succinate (TOPROL-XL) 50 MG 24 hr tablet Take 1 tablet (50 mg total) by mouth daily. 90 tablet 0   Multiple Vitamin (MULTIVITAMIN WITH MINERALS) TABS tablet Take 1 tablet by mouth daily with breakfast. CENTRUM MULTIVITAMIN     PREVIDENT 5000 BOOSTER PLUS 1.1 % PSTE Place 1 application  onto teeth every 3 (three) days.     Probiotic Product (PROBIOTIC PO) Take 1 capsule by mouth daily.     Wheat Dextrin (BENEFIBER PO) Take 8-10 g by mouth See admin instructions. Mix 8-10 grams (2 teaspoonsful) of powder into applesauce or the suggested amount of warm water and consume 2 times a day     No current facility-administered medications on file prior to visit.     The following portions of the patient's history were reviewed and updated as appropriate: allergies, current medications, past family history, past medical history, past social history, past surgical history and problem list.  ROS Otherwise as in subjective above    Objective: BP 120/62   Pulse 68   Temp 98.3 F (36.8 C) (Tympanic)   Ht 4\' 10"  (1.473 m)   Wt 111 lb (50.3 kg)  BMI 23.20 kg/m   General appearance: alert, no distress, well developed, well nourished Neck: supple, no lymphadenopathy, no thyromegaly, no masses Heart: RRR, normal S1, S2, no murmurs Lungs: CTA bilaterally, no wheezes, rhonchi, or rales Abdomen: +bs, soft, non tender, non distended, no masses, no hepatomegaly, no splenomegaly Pulses: 1+ UE and LE pulses, slightly reduced cap refill in great toes, otherwise normal cap refill Ext: no edema    Assessment: Encounter Diagnoses  Name Primary?   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  No significant symptoms today.  We discussed empiric therapy versus watch and wait.  She wants to watch and wait for the urine culture.   Continue good hydration.  Decreased pulses in the legs and purplish colors of toes-we discussed her concerns.  No significant claudication.  Her last CBC blood count and platelets was normal back in April 2024.  We will refer her for baseline ABI   Jocelynne was seen today for urinary tract infection.  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

## 2022-12-07 NOTE — Telephone Encounter (Signed)
Will forward this to [facility]. LPN as Lorain Childes

## 2022-12-07 NOTE — Telephone Encounter (Signed)
Patient is returning phone call. Patient has an exercise class this morning and an appointment this afternoon. Patient is requesting we call her back around 12:30 PM- 2:30 PM. Please advise.

## 2022-12-09 ENCOUNTER — Other Ambulatory Visit: Payer: Self-pay | Admitting: Medical

## 2022-12-09 MED ORDER — SULFAMETHOXAZOLE-TRIMETHOPRIM 800-160 MG PO TABS: 1.0000 | ORAL_TABLET | Freq: Two times a day (BID) | ORAL | 0 refills | Status: DC

## 2022-12-09 NOTE — Progress Notes (Signed)
The preliminary urine culture results show infection.  begin the antibiotic bactrim.  We will call when final culture results are available in the next few days.

## 2022-12-09 NOTE — Telephone Encounter (Signed)
Called patient left message on personal voice mail to call back. 

## 2022-12-09 NOTE — Telephone Encounter (Signed)
Received a call from patient Dr.Schumann's advice given.Stated since she did not remember taking Metoprolol she resumed taking the next day.Stated she would like to see Dr.Schumann before she has shoulder surgery the end of Oct.Appointment scheduled 10/4 at 2:20 pm.

## 2022-12-10 LAB — URINE CULTURE

## 2022-12-11 NOTE — Progress Notes (Signed)
The urine culture was positive for infection, and the bacteria is sensitive to bactrim.   As long as you are improving, finish the antibiotic as it should work.  If not improving, let me know.

## 2022-12-14 ENCOUNTER — Telehealth: Payer: Self-pay | Admitting: Family Medicine

## 2022-12-14 NOTE — Telephone Encounter (Signed)
Pt requesting call back re vascular studies   She thought you were checking her hands and feet but facility told her they only had orders for feet

## 2022-12-14 NOTE — Telephone Encounter (Signed)
Pt was notified. Pt has an appt on Wednesday

## 2022-12-16 ENCOUNTER — Ambulatory Visit (HOSPITAL_COMMUNITY)
Admission: RE | Admit: 2022-12-16 | Discharge: 2022-12-16 | Disposition: A | Discharge status: Home / Self Care | Payer: Medicare Other | Source: Ambulatory Visit | Attending: Medical | Admitting: Medical

## 2022-12-16 ENCOUNTER — Other Ambulatory Visit: Payer: Self-pay | Admitting: Medical

## 2022-12-16 DIAGNOSIS — R6889 Other general symptoms and signs: Secondary | ICD-10-CM

## 2022-12-16 DIAGNOSIS — I75023 Atheroembolism of bilateral lower extremities: Secondary | ICD-10-CM | POA: Insufficient documentation

## 2022-12-16 DIAGNOSIS — R0989 Other specified symptoms and signs involving the circulatory and respiratory systems: Secondary | ICD-10-CM | POA: Diagnosis present

## 2022-12-16 LAB — VAS US ABI WITH/WO TBI
Left ABI: 1.05
Right ABI: 1.21

## 2022-12-16 NOTE — Progress Notes (Signed)
Your ABI blood flow study overall shows decent blood flow to the legs, but the toe perfusion could be a little low.  If you want to go further I can refer you to vascular specialist to let them evaluate you and decide if you need other testing.  Given your concern about your cold and discolored toes, this is a reasonable referral.  Let me know if agreeable

## 2022-12-29 ENCOUNTER — Other Ambulatory Visit: Payer: Self-pay | Admitting: Family Medicine

## 2022-12-30 ENCOUNTER — Telehealth: Payer: Self-pay | Admitting: *Deleted

## 2022-12-30 NOTE — Telephone Encounter (Signed)
Pre-operative Risk Assessment    Patient Name: Alexis Hines  DOB: [birth year] MRN: [id number]    DATE OF LAST VISIT: 09/17/22 DR. SCHUMANN DATE OF NEXT VISIT: 01/01/23 DR. [facility]  Request for Surgical Clearance    Procedure:   RIGHT REVERSE SHOULDER ARTHROPLASTY   Date of Surgery:  Clearance TBD                                 Surgeon:  DR. Caryn Bee SUPPLE Surgeon's Group or Practice Name:  [facility] Phone number:  [phone] ATTN: Alexis Hines Fax number:  [phone]   Type of Clearance Requested:   - Medical ; NO MEDICATIONS LISTED AS NEEDING TO BE HELD   Type of Anesthesia:  General    Additional requests/questions:    Alexis Hines   12/30/2022, 5:23 PM

## 2022-12-31 NOTE — Telephone Encounter (Signed)
Pt has appt 01/01/23 with Dr. Bjorn Pippin. I will update all parties involved and update appt notes need pre op clearance.

## 2022-12-31 NOTE — Telephone Encounter (Signed)
Name: Alexis Hines  DOB: 1933/10/23  MRN: [id number]  Primary Cardiologist: Little Ishikawa, MD  Chart reviewed as part of pre-operative protocol coverage. The patient has an upcoming visit scheduled with Dr. Bjorn Pippin on 01/01/2023 at which time clearance can be addressed in case there are any issues that would impact surgical recommendations.  Right Reverse Shoulder Arthroplasty Is not scheduled until TBD as below. I added preop FYI to appointment note so that provider is aware to address at time of outpatient visit.  Per office protocol the cardiology provider should forward their finalized clearance decision and recommendations regarding antiplatelet therapy to the requesting party below.     I will route this message as FYI to requesting party and remove this message from the preop box as separate preop APP input not needed at this time.   Please call with any questions.  Joni Reining, NP  12/31/2022, 7:57 AM

## 2023-01-01 ENCOUNTER — Encounter: Payer: Self-pay | Admitting: Cardiology

## 2023-01-01 ENCOUNTER — Ambulatory Visit: Payer: Medicare Other | Attending: Cardiology | Admitting: Cardiology

## 2023-01-01 VITALS — BP 110/52 | HR 76 | Ht 58.5 in | Wt 115.0 lb

## 2023-01-01 DIAGNOSIS — R002 Palpitations: Secondary | ICD-10-CM | POA: Diagnosis not present

## 2023-01-01 DIAGNOSIS — Z01818 Encounter for other preprocedural examination: Secondary | ICD-10-CM | POA: Diagnosis not present

## 2023-01-01 NOTE — Progress Notes (Signed)
Cardiology Office Note:    Date:  01/01/2023   ID:  Alexis Hines, DOB November 01, 1933, MRN [id number]  PCP:  Joselyn Arrow, MD  Cardiologist:  Little Ishikawa, MD  Electrophysiologist:  None   Referring MD: Joselyn Arrow, MD   Chief Complaint  Patient presents with   Palpitations    History of Present Illness:    Alexis Hines is a 89 y.o. female with a hx of basal cell cancer, squamous cell carcinoma of skin, hyperlipidemia who presents for follow-up appointment. She was referred by Dr. Lynelle Doctor for evaluation of EKG abnormalities and preop evaluation, initially seen on 08/24/2019.  Plan for hip surgery on 08/29/2019.  Preoperative EKG done at Dr. Delford Field office 5/24 was concerning for AV dissociation and accelerated junctional rhythm.  On my review, appears to be sinus rhythm with frequent PACs.  Underwent hip surgery 10/10/19, no complications.  At initial clinic visit, she reported having palpitations and episodes of lightheadedness that lasted less than 1 minute.  She underwent a nuclear stress test in [place] on 07/05/2017, which showed normal EF, no perfusion defects.  Echocardiogram on 08/31/2019 showed LVEF 70-75%, grade 1 diastolic function, normal RV function, mild RV enlargement, degenerative mitral valve with mild MR. Zio patch x3 days on 09/14/2019 showed frequent PACs (11% of beats), with frequent episodes of SVT lasting up to 20 seconds.  Started on metoprolol.  Zio patch x3 days on 03/11/2021 on metoprolol showed 412 episodes of SVT with longest lasting 12 beats, frequent PACs (8.4% of beats with frequent supraventricular couplets (8.3%) and occasional supraventricular triplets (3.3%).  Echocardiogram 08/18/2021 showed EF 65 to 70%, grade 1 diastolic dysfunction, normal RV function, severe biatrial enlargement, mild to moderate tricuspid regurgitation, mild aortic regurgitation.  Since last clinic visit, she reports she is doing well.  Planning shoulder surgery.  She denies any chest  pain or dyspnea.  She continues to go to exercise class regularly, denies any symptoms with this.  She denies any lightheadedness, syncope, lower extreme edema, or palpitations.  Wt Readings from Last 3 Encounters:  01/01/23 115 lb (52.2 kg)  12/07/22 111 lb (50.3 kg)  12/02/22 110 lb 9.6 oz (50.2 kg)      Past Medical History:  Diagnosis Date   Abnormal TSH 2004   normal since   Arthritis    Basal cell cancer    nose; Dr. Terri Piedra    Diverticulosis 10/2005   Dizziness    Essential hypertension 09/03/2022   Migraine childhood   resolved   PAC (premature atrial contraction)    frequent on cardiac monitor 09/14/19   Pure hypercholesterolemia    Radial styloid tenosynovitis (de quervain) 11/2018   injected by Dr. Merlyn Lot 11/2018, 02/2019   SCCA (squamous cell carcinoma) of skin 12/2015   right temple   Squamous cell carcinoma of skin 02/15/2017   in situ-left jaw (CX35FU)    Past Surgical History:  Procedure Laterality Date   BREAST BIOPSY Left 1984   benign   CHOLECYSTECTOMY N/A 01/27/2022   Procedure: LAPAROSCOPIC CHOLECYSTECTOMY;  Surgeon: Quentin Ore, MD;  Location: [facility];  Service: General;  Laterality: N/A;   EYE SURGERY     Bil and eyelids lifted   IR RADIOLOGIST EVAL & MGMT  03/05/2022   left hip intra-articular steriod injection  03/13/2014   Mohs procedure     REVERSE SHOULDER ARTHROPLASTY Left 12/26/2020   Procedure: REVERSE SHOULDER ARTHROPLASTY;  Surgeon: Francena Hanly, MD;  Location: [facility];  Service: Orthopedics;  Laterality: Left;    TONSILLECTOMY  age 17   TOTAL HIP ARTHROPLASTY Right 10/10/2019   Procedure: TOTAL HIP ARTHROPLASTY ANTERIOR APPROACH;  Surgeon: Durene Romans, MD;  Location: [facility];  Service: Orthopedics;  Laterality: Right;  70 mins   TOTAL KNEE ARTHROPLASTY Left 07/2003   Dr. Thomasena Edis   TOTAL KNEE ARTHROPLASTY Right 09/03/2017   Procedure: RIGHT TOTAL KNEE ARTHROPLASTY;  Surgeon: Eugenia Mcalpine, MD;  Location: [facility];  Service:  Orthopedics;  Laterality: Right;    Current Medications: Current Meds  Medication Sig   atorvastatin (LIPITOR) 40 MG tablet TAKE 1 TABLET BY MOUTH AT  BEDTIME   Biotin 16109 MCG TABS Take 10,000 mcg by mouth in the morning.   Calcium Carbonate-Vitamin D (CALTRATE 600+D PO) Take 1 tablet by mouth daily.   ipratropium (ATROVENT) 0.06 % nasal spray Use 2 sprays in each nostril 3 to 4 times daily for runny nose   metoprolol succinate (TOPROL-XL) 50 MG 24 hr tablet Take 1 tablet (50 mg total) by mouth daily.   Multiple Vitamin (MULTIVITAMIN WITH MINERALS) TABS tablet Take 1 tablet by mouth daily with breakfast. CENTRUM MULTIVITAMIN   PREVIDENT 5000 BOOSTER PLUS 1.1 % PSTE Place 1 application  onto teeth every 3 (three) days.   Probiotic Product (PROBIOTIC PO) Take 1 capsule by mouth daily.   Wheat Dextrin (BENEFIBER PO) Take 8-10 g by mouth See admin instructions. Mix 8-10 grams (2 teaspoonsful) of powder into applesauce or the suggested amount of warm water and consume 2 times a day     Allergies:   Patient has no known allergies.   Social History   Socioeconomic History   Marital status: Widowed    Spouse name: Not on file   Number of children: 2   Years of education: Not on file   Highest education level: Not on file  Occupational History   Occupation: Retired Retail buyer  Tobacco Use   Smoking status: Never   Smokeless tobacco: Never  Vaping Use   Vaping status: Never Used  Substance and Sexual Activity   Alcohol use: Yes    Comment: occasional glass of wine.   Drug use: No   Sexual activity: Not Currently  Other Topics Concern   Not on file  Social History Narrative   Widowed. Lives 1/2 the year in [place]. Her husband (retired Librarian, academic) died from liver cancer in 05/06/2011.  Daughter lives in [place]; 2 grandchildren here, and 2 in [place]   Social Determinants of Health   Financial Resource Strain: Not on file  Food Insecurity: No Food Insecurity (02/03/2022)    Hunger Vital Sign    Worried About Running Out of Food in the Last Year: Never true    Ran Out of Food in the Last Year: Never true  Transportation Needs: No Transportation Needs (02/17/2022)   PRAPARE - Administrator, Civil Service (Medical): No    Lack of Transportation (Non-Medical): No  Physical Activity: Not on file  Stress: Not on file  Social Connections: Not on file     Family History: The patient's family history includes Cancer in her sister; Cancer (age of onset: 77) in her sister; Gout in her son; Heart disease (age of onset: 23) in her brother; Hyperlipidemia in her brother; Rheum arthritis in her daughter.  ROS:   Please see the history of present illness.     All other systems reviewed and are negative.  EKGs/Labs/Other Studies Reviewed:    The following studies  were reviewed today:   EKG:   01/01/2023: Normal sinus rhythm, PACs, rate 76 09/17/22: Normal sinus rhythm, rate 78, no PACs 08/06/21: Normal sinus rhythm, rate 65, no ST abnormality 02/27/21: Sinus rhythm, PACs, rate 62  Recent Labs: 02/04/2022: Magnesium 2.0 03/12/2022: Hemoglobin 13.2; Platelets 291 09/04/2022: ALT 18; BUN 26; Creatinine, Ser 0.83; Potassium 4.6; Sodium 142  Recent Lipid Panel    Component Value Date/Time   CHOL 181 11/09/2019 0947   TRIG 117 11/09/2019 0947   HDL 62 11/09/2019 0947   CHOLHDL 2.9 11/09/2019 0947   CHOLHDL 1.9 12/23/2016 0816   VLDL 26 12/26/2015 0706   LDLCALC 98 11/09/2019 0947   LDLCALC 65 12/23/2016 0816    Physical Exam:    VS:  BP (!) 110/52   Pulse 76   Ht 4' 10.5" (1.486 m)   Wt 115 lb (52.2 kg)   SpO2 90%   BMI 23.63 kg/m     Wt Readings from Last 3 Encounters:  01/01/23 115 lb (52.2 kg)  12/07/22 111 lb (50.3 kg)  12/02/22 110 lb 9.6 oz (50.2 kg)     GEN:  in no acute distress HEENT: Normal NECK: No JVD; No carotid bruits CARDIAC: irregular, tachycardic, no murmurs RESPIRATORY:  Clear to auscultation without rales, wheezing or  rhonchi  ABDOMEN: Soft, non-tender, non-distended MUSCULOSKELETAL:  No edema; No deformity  SKIN: Warm and dry NEUROLOGIC:  Alert and oriented x 3 PSYCHIATRIC:  Normal affect   ASSESSMENT:    1. Pre-op evaluation   2. Palpitations     PLAN:    Preop evaluation: Prior to shoulder surgery.  Good functional capacity, greater than 4 METS.  Denies any exertional symptoms.  RCRI score 0. -No further cardiac workup recommended prior to surgery  Lightheadedness/near syncope/palpitations: Frequent PACs/runs of SVT on EKG. Zio patch x3 days on 09/14/2019 showed frequent PACs (11% of beats), with frequent episodes of SVT lasting up to 20 seconds.  Echocardiogram on 08/31/2019 showed LVEF 70-75%, grade 1 diastolic function, normal RV function, mild RV enlargement, degenerative mitral valve with mild MR.  Started on metoprolol.  Zio patch x3 days on 03/11/2021 on metoprolol showed 412 episodes of SVT with longest lasting 12 beats, frequent PACs (8.4% of beats with frequent supraventricular couplets (8.3%) and occasional supraventricular triplets (3.3%).  Echocardiogram 08/18/2021 showed EF 65 to 70%, grade 1 diastolic dysfunction, normal RV function, severe biatrial enlargement, mild to moderate tricuspid regurgitation, mild aortic regurgitation. -Continue Toprol-XL 50 mg daily, reports no recent palpitations  RTC in 1 year   Medication Adjustments/Labs and Tests Ordered: Current medicines are reviewed at length with the patient today.  Concerns regarding medicines are outlined above.  Orders Placed This Encounter  Procedures   EKG 12-Lead    No orders of the defined types were placed in this encounter.    Patient Instructions  Medication Instructions:  No changes *If you need a refill on your cardiac medications before your next appointment, please call your pharmacy*   Lab Work: No labs If you have labs (blood work) drawn today and your tests are completely normal, you will receive your  results only by: MyChart Message (if you have MyChart) OR A paper copy in the mail If you have any lab test that is abnormal or we need to change your treatment, we will call you to review the results.   Testing/Procedures: No Testing   Follow-Up: At [facility], you and your health needs are our priority.  As part of  our continuing mission to provide you with exceptional heart care, we have created designated Provider Care Teams.  These Care Teams include your primary Cardiologist (physician) and Advanced Practice Providers (APPs -  Physician Assistants and Nurse Practitioners) who all work together to provide you with the care you need, when you need it.  We recommend signing up for the patient portal called "MyChart".  Sign up information is provided on this After Visit Summary.  MyChart is used to connect with patients for Virtual Visits (Telemedicine).  Patients are able to view lab/test results, encounter notes, upcoming appointments, etc.  Non-urgent messages can be sent to your provider as well.   To learn more about what you can do with MyChart, go to [email or web address].    Your next appointment:   1 year(s)  Provider:   Little Ishikawa, MD     Signed, Little Ishikawa, MD  01/01/2023 2:59 PM    [facility]

## 2023-01-01 NOTE — Patient Instructions (Signed)
Medication Instructions:  No changes *If you need a refill on your cardiac medications before your next appointment, please call your pharmacy*   Lab Work: No labs If you have labs (blood work) drawn today and your tests are completely normal, you will receive your results only by: MyChart Message (if you have MyChart) OR A paper copy in the mail If you have any lab test that is abnormal or we need to change your treatment, we will call you to review the results.   Testing/Procedures: No Testing   Follow-Up: At [facility], you and your health needs are our priority.  As part of our continuing mission to provide you with exceptional heart care, we have created designated Provider Care Teams.  These Care Teams include your primary Cardiologist (physician) and Advanced Practice Providers (APPs -  Physician Assistants and Nurse Practitioners) who all work together to provide you with the care you need, when you need it.  We recommend signing up for the patient portal called "MyChart".  Sign up information is provided on this After Visit Summary.  MyChart is used to connect with patients for Virtual Visits (Telemedicine).  Patients are able to view lab/test results, encounter notes, upcoming appointments, etc.  Non-urgent messages can be sent to your provider as well.   To learn more about what you can do with MyChart, go to [email or web address].    Your next appointment:   1 year(s)  Provider:   Little Ishikawa, MD

## 2023-01-06 ENCOUNTER — Encounter: Payer: Self-pay | Admitting: Family Medicine

## 2023-01-06 ENCOUNTER — Ambulatory Visit: Payer: Medicare Other | Admitting: Family Medicine

## 2023-01-06 VITALS — BP 120/70 | HR 84 | Ht <= 58 in | Wt 112.6 lb

## 2023-01-06 DIAGNOSIS — M25511 Pain in right shoulder: Secondary | ICD-10-CM

## 2023-01-06 DIAGNOSIS — Z01818 Encounter for other preprocedural examination: Secondary | ICD-10-CM | POA: Diagnosis not present

## 2023-01-06 DIAGNOSIS — Z7185 Encounter for immunization safety counseling: Secondary | ICD-10-CM

## 2023-01-06 DIAGNOSIS — R0989 Other specified symptoms and signs involving the circulatory and respiratory systems: Secondary | ICD-10-CM

## 2023-01-06 DIAGNOSIS — G8929 Other chronic pain: Secondary | ICD-10-CM

## 2023-01-06 DIAGNOSIS — Z23 Encounter for immunization: Secondary | ICD-10-CM

## 2023-01-06 DIAGNOSIS — R6889 Other general symptoms and signs: Secondary | ICD-10-CM

## 2023-01-06 NOTE — Progress Notes (Signed)
Chief Complaint  Patient presents with   Surgical Clearance    Patient is here for surgical clearance today for right shoulder surgery. She did re-start her statin in June. Also wants high dose flu shot today, did not get RSV vaccine.     Patient presents for form completion, for surgical clearance for R shoulder replacement. She saw Dr. Rennis Chris 10/2. Surgery cannot be scheduled until after 10/23 (3 months from last injection). She saw Dr. Bjorn Pippin 10/4 and got cardiac clearance. Last seen by me in 08/2022.  She was treated for a UTI by Vincenza Hews last month. She has no ongoing urinary symptoms. Vincenza Hews also evaluated her for purple toes last month. She had normal ABI, but abnormal toe-brachial index.  She was referred to vascular surgery. She decided against going. She is willing to now, just doesn't want it to interfere with her surgery.  Summary:  Right: Resting right ankle-brachial index is within normal range. The right toe-brachial index is abnormal.  Left: Resting left ankle-brachial index is within normal range. The left  toe-brachial index is abnormal.   On atorvastatin for hyperlipidemia.  Stopped atorvastatin related to elevated LFT's. After levels were normal in June, she restarted her atorvastatin.  She has been compliant with atorvastatin since then, and denies side effects. She had elevated LFT's s/p cholecystectomy, c/b abscess. She had MRCP and ERCP in [place]. 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  LFTs were normal in June. She denies any GI problems.  Lab Results  Component Value Date   ALT 18 09/04/2022   AST 26 09/04/2022   ALKPHOS 100 09/04/2022   BILITOT 0.5 09/04/2022      PMH, PSH, SH reviewed  Outpatient Encounter Medications as of 01/06/2023  Medication Sig Note   atorvastatin (LIPITOR) 40 MG tablet TAKE 1 TABLET BY MOUTH AT  BEDTIME    Biotin 40981 MCG TABS Take 10,000 mcg by mouth in the morning. 09/17/2022: Patient has been taking  5000 mcg daily   Calcium Carbonate-Vitamin D (CALTRATE 600+D PO) Take 1 tablet by mouth daily.    ipratropium (ATROVENT) 0.06 % nasal spray Use 2 sprays in each nostril 3 to 4 times daily for runny nose    metoprolol succinate (TOPROL-XL) 50 MG 24 hr tablet Take 1 tablet (50 mg total) by mouth daily.    Multiple Vitamin (MULTIVITAMIN WITH MINERALS) TABS tablet Take 1 tablet by mouth daily with breakfast. CENTRUM MULTIVITAMIN    PREVIDENT 5000 BOOSTER PLUS 1.1 % PSTE Place 1 application  onto teeth every 3 (three) days. 01/06/2023: Uses every 4th day   Probiotic Product (PROBIOTIC PO) Take 1 capsule by mouth daily.    Wheat Dextrin (BENEFIBER PO) Take 8-10 g by mouth See admin instructions. Mix 8-10 grams (2 teaspoonsful) of powder into applesauce or the suggested amount of warm water and consume 2 times a day 09/03/2022: BID    ZINC-VITAMIN C PO Take 1 tablet by mouth daily.    [DISCONTINUED] sulfamethoxazole-trimethoprim (BACTRIM DS) 800-160 MG tablet Take 1 tablet by mouth 2 (two) times daily. (Patient not taking: Reported on 01/01/2023)    No facility-administered encounter medications on file as of 01/06/2023.   No Known Allergies  ROS:  She still feels a little weak (since gall bladder surgery/complications). She exercises 4x/week, gets through the class, but feels quite tired about 5 minutes before it ends (after 45 minutes); this has improved from June, where she felt tired after 30 minutes. Takes 1-2 short naps/day. Wakes up  2x/night because she is thirsty and to void. She drinks more water when she gets up. She can get back to sleep easily. Normal appetite, but things don't taste as good. Denies weight changes. No f/c, URI symptoms, just chronic runny nose.  Ipratroprium is helpful. Denies cough, shortness of breath. No chest pain or palpitations, edema. R pinkie pain in cold weather and if she bumps it.   PHYSICAL EXAM:  BP 120/70   Pulse 84   Ht 4\' 10"  (1.473 m)   Wt 112 lb 9.6  oz (51.1 kg)   BMI 23.53 kg/m   Wt Readings from Last 3 Encounters:  01/06/23 112 lb 9.6 oz (51.1 kg)  01/01/23 115 lb (52.2 kg)  12/07/22 111 lb (50.3 kg)   Well-appearing female, in no distress, appears younger than stated age HEENT: conjunctiva and sclera are clear, EOMI. OP is clear Neck: No lymphadenopathy, thyromegaly or carotid bruit Heart: regular rate and rhythm. Rare ectopic beat/pause noted Lungs: clear bilaterally Back: no spinal or CVA tenderness Abdomen: soft, nontender, no organomegaly or mass Extremities: no edema. Decreased DP pulses, palpable PT.  Superficial veins noted bilaterally on LE's, nontender, purple toes bilaterally, all toes. Psych: normal mood, affect, hygiene and grooming Neuro: alert and oriented, cranial nerves are grossly intact, normal gait.    ASSESSMENT/PLAN:   Pre-operative clearance  Chronic right shoulder pain - planning R total shoulder replacement with Dr. Rennis Chris  Decreased pedal pulses - normal ABI, abnormal TBI bilaterally.  Discussed this could indicate PAD, and encouraged her to have consult. Cont statin - Plan: Ambulatory referral to Vascular Surgery  Abnormal ankle brachial index (ABI) - normal ABI, but abnormal TBI bilaterally. Referred for consult - Plan: Ambulatory referral to Vascular Surgery  Need for influenza vaccination - Plan: Flu Vaccine Trivalent High Dose (Fluad)  Vaccine counseling - discussed RSV recommendations, to get from pharmacy. Delay COVID booster due to recent illness (until November)  Pt on statin, no blood thinners or aspirin.  Due to upcoming surgery, not starting any aspirin. Await vascular consult. She doesn't want the consult to delay her surgery, wants to have it end of October.  I spent 40 minutes dedicated to the care of this patient, including pre-visit review of records, face to face time, post-visit ordering of testing and documentation.

## 2023-01-06 NOTE — Patient Instructions (Signed)
I recommend getting the RSV vaccine from the pharmacy in 2 weeks (wait 2 weeks from today's flu shot). You can delay your COVID booster until the end of November, due to your COVID illness in August.

## 2023-01-12 ENCOUNTER — Inpatient Hospital Stay (HOSPITAL_COMMUNITY): Admission: RE | Admit: 2023-01-12 | Payer: Medicare Other | Source: Ambulatory Visit

## 2023-01-12 NOTE — Patient Instructions (Signed)
SURGICAL WAITING ROOM VISITATION Patients having surgery or a procedure may have no more than 2 support people in the waiting area - these visitors may rotate.    Children under the age of 31 must have an adult with them who is not the patient.  If the patient needs to stay at the hospital during part of their recovery, the visitor guidelines for inpatient rooms apply. Pre-op nurse will coordinate an appropriate time for 1 support person to accompany patient in pre-op.  This support person may not rotate.    Please refer to the [facility] website for the visitor guidelines for Inpatients (after your surgery is over and you are in a regular room).       Your procedure is scheduled on: 01-21-23   Report to [facility] Main Entrance    Report to admitting at 7:30 AM   Call this number if you have problems the morning of surgery [phone]   Do not eat food :After Midnight.   After Midnight you may have the following liquids until 7:00 AM DAY OF SURGERY  Water Non-Citrus Juices (without pulp, NO RED-Apple, White grape, White cranberry) Black Coffee (NO MILK/CREAM OR CREAMERS, sugar ok)  Clear Tea (NO MILK/CREAM OR CREAMERS, sugar ok) regular and decaf                             Plain Jell-O (NO RED)                                           Fruit ices (not with fruit pulp, NO RED)                                     Popsicles (NO RED)                                                               Sports drinks like Gatorade (NO RED)                   The day of surgery:  Drink ONE (1) Pre-Surgery Clear Ensure by 7:00 AM the morning of surgery. Drink in one sitting. Do not sip.  This drink was given to you during your hospital  pre-op appointment visit. Nothing else to drink after completing the Pre-Surgery Clear Ensure .          If you have questions, please contact your surgeon's office.   FOLLOW ANY ADDITIONAL PRE OP INSTRUCTIONS YOU RECEIVED FROM YOUR SURGEON'S  OFFICE!!!     Oral Hygiene is also important to reduce your risk of infection.                                    Remember - BRUSH YOUR TEETH THE MORNING OF SURGERY WITH YOUR REGULAR TOOTHPASTE   Do NOT smoke after Midnight   Take these medicines the morning of surgery with A SIP OF WATER:   Metoprolol  Stop all vitamins  and herbal supplements 7 days before surgery  Bring CPAP mask and tubing day of surgery.                              You may not have any metal on your body including hair pins, jewelry, and body piercing             Do not wear make-up, lotions, powders, perfumes or deodorant  Do not wear nail polish including gel and S&S, artificial/acrylic nails, or any other type of covering on natural nails including finger and toenails. If you have artificial nails, gel coating, etc. that needs to be removed by a nail salon please have this removed prior to surgery or surgery may need to be canceled/ delayed if the surgeon/ anesthesia feels like they are unable to be safely monitored.   Do not shave  48 hours prior to surgery.               Men may shave face and neck.   Do not bring valuables to the hospital. [place] IS NOT RESPONSIBLE   FOR VALUABLES.   Contacts, dentures or bridgework may not be worn into surgery.   Bring small overnight bag day of surgery.   DO NOT BRING YOUR HOME MEDICATIONS TO THE HOSPITAL. PHARMACY WILL DISPENSE MEDICATIONS LISTED ON YOUR MEDICATION LIST TO YOU DURING YOUR ADMISSION IN THE HOSPITAL!   Special Instructions: Bring a copy of your healthcare power of attorney and living will documents the day of surgery if you haven't scanned them before.              Please read over the following fact sheets you were given: IF YOU HAVE QUESTIONS ABOUT YOUR PRE-OP INSTRUCTIONS PLEASE CALL [phone] [address]  If you received a COVID test during your pre-op visit  it is requested that you wear a mask when out in public, stay away from anyone that  may not be feeling well and notify your surgeon if you develop symptoms. If you test positive for Covid or have been in contact with anyone that has tested positive in the last 10 days please notify you surgeon.    Pre-operative 5 CHG Bath Instructions   You can play a key role in reducing the risk of infection after surgery. Your skin needs to be as free of germs as possible. You can reduce the number of germs on your skin by washing with CHG (chlorhexidine gluconate) soap before surgery. CHG is an antiseptic soap that kills germs and continues to kill germs even after washing.   DO NOT use if you have an allergy to chlorhexidine/CHG or antibacterial soaps. If your skin becomes reddened or irritated, stop using the CHG and notify one of our RNs at [phone].   Please shower with the CHG soap starting 4 days before surgery using the following schedule:     Please keep in mind the following:  DO NOT shave, including legs and underarms, starting the day of your first shower.   You may shave your face at any point before/day of surgery.  Place clean sheets on your bed the day you start using CHG soap. Use a clean washcloth (not used since being washed) for each shower. DO NOT sleep with pets once you start using the CHG.   CHG Shower Instructions:  If you choose to wash your hair and private area, wash first with your normal shampoo/soap.  After you use shampoo/soap, rinse your hair and body thoroughly to remove shampoo/soap residue.  Turn the water OFF and apply about 3 tablespoons (45 ml) of CHG soap to a CLEAN washcloth.  Apply CHG soap ONLY FROM YOUR NECK DOWN TO YOUR TOES (washing for 3-5 minutes)  DO NOT use CHG soap on face, private areas, open wounds, or sores.  Pay special attention to the area where your surgery is being performed.  If you are having back surgery, having someone wash your back for you may be helpful. Wait 2 minutes after CHG soap is applied, then you may rinse  off the CHG soap.  Pat dry with a clean towel  Put on clean clothes/pajamas   If you choose to wear lotion, please use ONLY the CHG-compatible lotions on the back of this paper.     Additional instructions for the day of surgery: DO NOT APPLY any lotions, deodorants, cologne, or perfumes.   Put on clean/comfortable clothes.  Brush your teeth.  Ask your nurse before applying any prescription medications to the skin.      CHG Compatible Lotions   Aveeno Moisturizing lotion  Cetaphil Moisturizing Cream  Cetaphil Moisturizing Lotion  Clairol Herbal Essence Moisturizing Lotion, Dry Skin  Clairol Herbal Essence Moisturizing Lotion, Extra Dry Skin  Clairol Herbal Essence Moisturizing Lotion, Normal Skin  Curel Age Defying Therapeutic Moisturizing Lotion with Alpha Hydroxy  Curel Extreme Care Body Lotion  Curel Soothing Hands Moisturizing Hand Lotion  Curel Therapeutic Moisturizing Cream, Fragrance-Free  Curel Therapeutic Moisturizing Lotion, Fragrance-Free  Curel Therapeutic Moisturizing Lotion, Original Formula  Eucerin Daily Replenishing Lotion  Eucerin Dry Skin Therapy Plus Alpha Hydroxy Crme  Eucerin Dry Skin Therapy Plus Alpha Hydroxy Lotion  Eucerin Original Crme  Eucerin Original Lotion  Eucerin Plus Crme Eucerin Plus Lotion  Eucerin TriLipid Replenishing Lotion  Keri Anti-Bacterial Hand Lotion  Keri Deep Conditioning Original Lotion Dry Skin Formula Softly Scented  Keri Deep Conditioning Original Lotion, Fragrance Free Sensitive Skin Formula  Keri Lotion Fast Absorbing Fragrance Free Sensitive Skin Formula  Keri Lotion Fast Absorbing Softly Scented Dry Skin Formula  Keri Original Lotion  Keri Skin Renewal Lotion Keri Silky Smooth Lotion  Keri Silky Smooth Sensitive Skin Lotion  Nivea Body Creamy Conditioning Oil  Nivea Body Extra Enriched Teacher, adult education Moisturizing Lotion Nivea Crme  Nivea Skin Firming Lotion  NutraDerm 30  Skin Lotion  NutraDerm Skin Lotion  NutraDerm Therapeutic Skin Cream  NutraDerm Therapeutic Skin Lotion  ProShield Protective Hand Cream  Provon moisturizing lotion   PATIENT SIGNATURE_________________________________  NURSE SIGNATURE__________________________________  ________________________________________________________________________

## 2023-01-12 NOTE — Progress Notes (Signed)
Surgery orders requested via Epic inbox. °

## 2023-01-13 ENCOUNTER — Other Ambulatory Visit (HOSPITAL_COMMUNITY): Payer: Medicare Other

## 2023-01-14 ENCOUNTER — Other Ambulatory Visit: Payer: Self-pay

## 2023-01-14 ENCOUNTER — Encounter (HOSPITAL_COMMUNITY)
Admission: RE | Admit: 2023-01-14 | Discharge: 2023-01-14 | Disposition: A | Discharge status: Home / Self Care | Payer: Medicare Other | Source: Ambulatory Visit | Attending: Orthopedic Surgery | Admitting: Orthopedic Surgery

## 2023-01-14 ENCOUNTER — Encounter (HOSPITAL_COMMUNITY): Payer: Self-pay

## 2023-01-14 VITALS — BP 159/81 | HR 80 | Temp 98.1°F | Resp 16 | Ht <= 58 in | Wt 114.0 lb

## 2023-01-14 DIAGNOSIS — I10 Essential (primary) hypertension: Secondary | ICD-10-CM | POA: Insufficient documentation

## 2023-01-14 DIAGNOSIS — Z01812 Encounter for preprocedural laboratory examination: Secondary | ICD-10-CM | POA: Insufficient documentation

## 2023-01-14 DIAGNOSIS — Z01818 Encounter for other preprocedural examination: Secondary | ICD-10-CM

## 2023-01-14 LAB — BASIC METABOLIC PANEL
Anion gap: 11 (ref 5–15)
BUN: 19 mg/dL (ref 8–23)
CO2: 27 mmol/L (ref 22–32)
Calcium: 9.9 mg/dL (ref 8.9–10.3)
Chloride: 102 mmol/L (ref 98–111)
Creatinine, Ser: 0.78 mg/dL (ref 0.44–1.00)
GFR, Estimated: >60 mL/min (ref >60)
Glucose, Bld: 87 mg/dL (ref 70–99)
Potassium: 4.2 mmol/L (ref 3.5–5.1)
Sodium: 140 mmol/L (ref 135–145)

## 2023-01-14 LAB — CBC
HCT: 45.6 % (ref 36.0–46.0)
Hemoglobin: 14.7 g/dL (ref 12.0–15.0)
MCH: 30.5 pg (ref 26.0–34.0)
MCHC: 32.2 g/dL (ref 30.0–36.0)
MCV: 94.6 fL (ref 80.0–100.0)
Platelets: 273 10*3/uL (ref 150–400)
RBC: 4.82 MIL/uL (ref 3.87–5.11)
RDW: 13.5 % (ref 11.5–15.5)
WBC: 9.3 10*3/uL (ref 4.0–10.5)
nRBC: 0 % (ref 0.0–0.2)

## 2023-01-14 NOTE — Progress Notes (Addendum)
COVID Vaccine received:  []  No [x]  Yes Date of any COVID positive Test in last 90 days: August 2024 PCP - Dr.Eve Lynelle Doctor Cardiologist - Dr. Nathaniel Man  Chest x-ray - 02/04/22 Epic EKG -  01/01/23 EPIC Stress Test - 07/05/17 EPIC ECHO - 08/18/21 EPIC Cardiac Cath -  Cardiac clearance Dr. Bjorn Pippin 01/01/23 Medical clearance Dr. Lynelle Doctor 01/06/23   Bowel Prep - [x]  No  []   Yes ______  Pacemaker / ICD device [x]  No []  Yes   Spinal Cord Stimulator:[x]  No []  Yes       History of Sleep Apnea? [x]  No []  Yes   CPAP used?- [x]  No []  Yes    Does the patient monitor blood sugar?          [x]  No []  Yes  []  N/A  Patient has: [x]  NO Hx DM   []  Pre-DM                 []  DM1  []   DM2 Does patient have a [employer] or Dexacom? []  No []  Yes   Fasting Blood Sugar Ranges-  Checks Blood Sugar _____ times a day  GLP1 agonist / usual dose - no GLP1 instructions:  SGLT-2 inhibitors / usual dose - no SGLT-2 instructions:   Blood Thinner / Instructions:no  Aspirin Instructions:no  Comments:   Activity level: Patient is able  to climb a flight of stairs without difficulty; [x]  No CP  [x]  No SOB,    Patient can perform ADLs without assistance.   Anesthesia review:   Patient denies shortness of breath, fever, cough and chest pain at PAT appointment.  Patient verbalized understanding and agreement to the Pre-Surgical Instructions that were given to them at this PAT appointment. Patient was also educated of the need to review these PAT instructions again prior to his/her surgery.I reviewed the appropriate phone numbers to call if they have any and questions or concerns.

## 2023-01-15 LAB — SURGICAL PCR SCREEN
MRSA, PCR: POSITIVE — AB
Staphylococcus aureus: POSITIVE — AB

## 2023-01-15 NOTE — Progress Notes (Signed)
Please review pt's preop PCR results from 01/14/23.

## 2023-01-15 NOTE — Plan of Care (Signed)
CHL Tonsillectomy/Adenoidectomy, Postoperative PEDS care plan entered in error.

## 2023-01-15 NOTE — Progress Notes (Signed)
Anesthesia Chart Review   Case: [id number] Date/Time: 01/21/23 0945   Procedure: REVERSE SHOULDER ARTHROPLASTY (Right: Shoulder) -   Anesthesia type: General   Pre-op diagnosis: Right shoulder rotator cuff tear arthropathy   Location: [facility]OM 06 / [facility]   Surgeons: Francena Hanly, MD       DISCUSSION:89 y.o. never smoker with h/o HTN, PACs, right shoulder rotator cuff tear scheduled for above procedure 01/21/23 with Dr. Francena Hanly.   Pt last seen by cardiology 01/01/2023. Per OV note, "Preop evaluation: Prior to shoulder surgery.  Good functional capacity, greater than 4 METS.  Denies any exertional symptoms.  RCRI score 0. -No further cardiac workup recommended prior to surgery"  VS: BP (!) 159/81   Pulse 80   Temp 36.7 C (Oral)   Resp 16   Ht 4\' 10"  (1.473 m)   Wt 51.7 kg   SpO2 97%   BMI 23.83 kg/m   PROVIDERS: Joselyn Arrow, MD is PCP    LABS: Labs reviewed: Acceptable for surgery. (all labs ordered are listed, but only abnormal results are displayed)  Labs Reviewed  SURGICAL PCR SCREEN  BASIC METABOLIC PANEL  CBC     IMAGES:   EKG:   CV: Echo 08/18/21 1. Left ventricular ejection fraction, by estimation, is 65 to 70%. The  left ventricle has normal function. The left ventricle has no regional  wall motion abnormalities. Left ventricular diastolic parameters are  consistent with Grade I diastolic  dysfunction (impaired relaxation).   2. Right ventricular systolic function is normal. The right ventricular  size is normal. There is normal pulmonary artery systolic pressure.   3. Left atrial size was severely dilated.   4. Right atrial size was severely dilated.   5. The mitral valve is degenerative. Trivial mitral valve regurgitation.  No evidence of mitral stenosis.   6. Tricuspid valve regurgitation is mild to moderate.   7. The aortic valve is tricuspid. There is mild calcification of the  aortic valve. There is mild thickening of the aortic  valve. Aortic valve  regurgitation is mild. No aortic stenosis is present.   8. The inferior vena cava is normal in size with greater than 50%  respiratory variability, suggesting right atrial pressure of 3 mmHg.  Past Medical History:  Diagnosis Date   Abnormal TSH 2004   normal since   Arthritis    Basal cell cancer    nose; Dr. Terri Piedra    Diverticulosis 10/2005   Dizziness    Essential hypertension 09/03/2022   Migraine childhood   resolved   PAC (premature atrial contraction)    frequent on cardiac monitor 09/14/19   Pure hypercholesterolemia    Radial styloid tenosynovitis (de quervain) 11/2018   injected by Dr. Merlyn Lot 11/2018, 02/2019   SCCA (squamous cell carcinoma) of skin 12/2015   right temple   Squamous cell carcinoma of skin 02/15/2017   in situ-left jaw (CX35FU)    Past Surgical History:  Procedure Laterality Date   BREAST BIOPSY Left 1984   benign   CHOLECYSTECTOMY N/A 01/27/2022   Procedure: LAPAROSCOPIC CHOLECYSTECTOMY;  Surgeon: Quentin Ore, MD;  Location: [facility];  Service: General;  Laterality: N/A;   EYE SURGERY     Bil and eyelids lifted   IR RADIOLOGIST EVAL & MGMT  03/05/2022   left hip intra-articular steriod injection  03/13/2014   Mohs procedure     REVERSE SHOULDER ARTHROPLASTY Left 12/26/2020   Procedure: REVERSE SHOULDER ARTHROPLASTY;  Surgeon: Francena Hanly,  MD;  Location: [facility];  Service: Orthopedics;  Laterality: Left;    TONSILLECTOMY  age 51   TOTAL HIP ARTHROPLASTY Right 10/10/2019   Procedure: TOTAL HIP ARTHROPLASTY ANTERIOR APPROACH;  Surgeon: Durene Romans, MD;  Location: [facility];  Service: Orthopedics;  Laterality: Right;  70 mins   TOTAL KNEE ARTHROPLASTY Left 07/2003   Dr. Thomasena Edis   TOTAL KNEE ARTHROPLASTY Right 09/03/2017   Procedure: RIGHT TOTAL KNEE ARTHROPLASTY;  Surgeon: Eugenia Mcalpine, MD;  Location: [facility];  Service: Orthopedics;  Laterality: Right;    MEDICATIONS:  atorvastatin (LIPITOR) 40 MG tablet   Biotin  78295 MCG TABS   Calcium Carbonate-Vitamin D (CALTRATE 600+D PO)   ipratropium (ATROVENT) 0.06 % nasal spray   metoprolol succinate (TOPROL-XL) 50 MG 24 hr tablet   Multiple Vitamin (MULTIVITAMIN WITH MINERALS) TABS tablet   PREVIDENT 5000 BOOSTER PLUS 1.1 % PSTE   Probiotic Product (PROBIOTIC PO)   Wheat Dextrin (BENEFIBER PO)   ZINC-VITAMIN C PO   No current facility-administered medications for this encounter.     Jodell Cipro Ward, PA-C [facility] Pre-Surgical Testing [phone]

## 2023-01-20 NOTE — Anesthesia Preprocedure Evaluation (Signed)
Anesthesia Evaluation  Patient identified by MRN, date of birth, ID band Patient awake    Reviewed: Allergy & Precautions, NPO status , Patient's Chart, lab work & pertinent test results, reviewed documented beta blocker date and time   History of Anesthesia Complications Negative for: history of anesthetic complications  Airway Mallampati: II  TM Distance: >3 FB Neck ROM: Full    Dental  (+) Dental Advisory Given, Partial Lower   Pulmonary neg pulmonary ROS   Pulmonary exam normal        Cardiovascular hypertension, Pt. on medications and Pt. on home beta blockers (-) CHF Normal cardiovascular exam+ Valvular Problems/Murmurs    '23 TTE - EF 65 to 70%. Grade I diastolic dysfunction (impaired relaxation). Left atrial size was severely dilated. Right atrial size was severely dilated. Trivial mitral valve regurgitation. Tricuspid valve regurgitation is mild to moderate. Aortic valve regurgitation is mild.      Neuro/Psych  Headaches PSYCHIATRIC DISORDERS Anxiety        GI/Hepatic negative GI ROS, Neg liver ROS,,,  Endo/Other  negative endocrine ROS    Renal/GU negative Renal ROS     Musculoskeletal  (+) Arthritis ,    Abdominal   Peds  Hematology negative hematology ROS (+)   Anesthesia Other Findings   Reproductive/Obstetrics                             Anesthesia Physical Anesthesia Plan  ASA: 2  Anesthesia Plan: General   Post-op Pain Management: Tylenol PO (pre-op)* and Regional block*   Induction: Intravenous  PONV Risk Score and Plan: 3 and Treatment may vary due to age or medical condition, Ondansetron and Propofol infusion  Airway Management Planned: Oral ETT  Additional Equipment: None  Intra-op Plan:   Post-operative Plan: Extubation in OR  Informed Consent: I have reviewed the patients History and Physical, chart, labs and discussed the procedure including the risks,  benefits and alternatives for the proposed anesthesia with the patient or authorized representative who has indicated his/her understanding and acceptance.     Dental advisory given  Plan Discussed with: CRNA and Anesthesiologist  Anesthesia Plan Comments:         Anesthesia Quick Evaluation

## 2023-01-21 ENCOUNTER — Ambulatory Visit (HOSPITAL_BASED_OUTPATIENT_CLINIC_OR_DEPARTMENT_OTHER): Payer: Medicare Other | Admitting: Anesthesiology

## 2023-01-21 ENCOUNTER — Other Ambulatory Visit: Payer: Self-pay

## 2023-01-21 ENCOUNTER — Ambulatory Visit (HOSPITAL_COMMUNITY)
Admission: RE | Admit: 2023-01-21 | Discharge: 2023-01-22 | Disposition: A | Discharge status: Home / Self Care | Payer: Medicare Other | Source: Ambulatory Visit | Attending: Orthopedic Surgery | Admitting: Orthopedic Surgery

## 2023-01-21 ENCOUNTER — Encounter (HOSPITAL_COMMUNITY): Payer: Self-pay | Admitting: Orthopedic Surgery

## 2023-01-21 ENCOUNTER — Encounter (HOSPITAL_COMMUNITY)
Admission: RE | Disposition: A | Discharge status: Home / Self Care | Payer: Self-pay | Source: Ambulatory Visit | Attending: Orthopedic Surgery

## 2023-01-21 ENCOUNTER — Ambulatory Visit (HOSPITAL_COMMUNITY): Payer: Medicare Other | Admitting: Physician Assistant

## 2023-01-21 ENCOUNTER — Other Ambulatory Visit (HOSPITAL_COMMUNITY): Payer: Self-pay

## 2023-01-21 DIAGNOSIS — I110 Hypertensive heart disease with heart failure: Secondary | ICD-10-CM | POA: Insufficient documentation

## 2023-01-21 DIAGNOSIS — I5030 Unspecified diastolic (congestive) heart failure: Secondary | ICD-10-CM | POA: Diagnosis not present

## 2023-01-21 DIAGNOSIS — Z96611 Presence of right artificial shoulder joint: Secondary | ICD-10-CM

## 2023-01-21 DIAGNOSIS — M75101 Unspecified rotator cuff tear or rupture of right shoulder, not specified as traumatic: Secondary | ICD-10-CM | POA: Insufficient documentation

## 2023-01-21 DIAGNOSIS — M12811 Other specific arthropathies, not elsewhere classified, right shoulder: Secondary | ICD-10-CM | POA: Diagnosis not present

## 2023-01-21 DIAGNOSIS — Z96612 Presence of left artificial shoulder joint: Secondary | ICD-10-CM | POA: Diagnosis not present

## 2023-01-21 HISTORY — PX: REVERSE SHOULDER ARTHROPLASTY: SHX5054

## 2023-01-21 SURGERY — ARTHROPLASTY, SHOULDER, TOTAL, REVERSE
Anesthesia: General | Site: Shoulder | Laterality: Right

## 2023-01-21 MED ORDER — OXYCODONE HCL 5 MG PO TABS: 5.0000 mg | ORAL_TABLET | Freq: Once | ORAL | Status: DC | PRN

## 2023-01-21 MED ORDER — POLYETHYLENE GLYCOL 3350 17 G PO PACK: 17.0000 g | PACK | Freq: Every day | ORAL | Status: DC | PRN

## 2023-01-21 MED ORDER — HYDROMORPHONE HCL 1 MG/ML IJ SOLN: 0.5000 mg | INTRAMUSCULAR | Status: DC | PRN

## 2023-01-21 MED ORDER — SUGAMMADEX SODIUM 200 MG/2ML IV SOLN
INTRAVENOUS | Status: DC | PRN
Start: 2023-01-21 — End: 2023-01-21
  Administered 2023-01-21: 200 mg via INTRAVENOUS

## 2023-01-21 MED ORDER — METOCLOPRAMIDE HCL 5 MG/ML IJ SOLN: 5.0000 mg | Freq: Three times a day (TID) | INTRAMUSCULAR | Status: DC | PRN

## 2023-01-21 MED ORDER — LACTATED RINGERS IV SOLN: INTRAVENOUS | Status: DC | PRN

## 2023-01-21 MED ORDER — OXYCODONE HCL 5 MG PO TABS
5.0000 mg | ORAL_TABLET | ORAL | Status: DC | PRN
  Administered 2023-01-22: 5 mg via ORAL
  Filled 2023-01-21: qty 1

## 2023-01-21 MED ORDER — FENTANYL CITRATE (PF) 100 MCG/2ML IJ SOLN
INTRAMUSCULAR | Status: DC | PRN
  Administered 2023-01-21: 25 ug via INTRAVENOUS
  Administered 2023-01-21: 50 ug via INTRAVENOUS
  Administered 2023-01-21: 25 ug via INTRAVENOUS

## 2023-01-21 MED ORDER — PHENYLEPHRINE HCL-NACL 20-0.9 MG/250ML-% IV SOLN
INTRAVENOUS | Status: DC | PRN
  Administered 2023-01-21: 40 ug/min via INTRAVENOUS

## 2023-01-21 MED ORDER — FENTANYL CITRATE PF 50 MCG/ML IJ SOSY
PREFILLED_SYRINGE | INTRAMUSCULAR | Status: AC
  Administered 2023-01-21: 50 ug
  Filled 2023-01-21: qty 2

## 2023-01-21 MED ORDER — EPHEDRINE SULFATE-NACL 50-0.9 MG/10ML-% IV SOSY
PREFILLED_SYRINGE | INTRAVENOUS | Status: DC | PRN
  Administered 2023-01-21: 5 mg via INTRAVENOUS

## 2023-01-21 MED ORDER — ATORVASTATIN CALCIUM 40 MG PO TABS
40.0000 mg | ORAL_TABLET | Freq: Every day | ORAL | Status: DC
  Administered 2023-01-21: 40 mg via ORAL
  Filled 2023-01-21: qty 1

## 2023-01-21 MED ORDER — CHLORHEXIDINE GLUCONATE 4 % EX SOLN
1.0000 | CUTANEOUS | 1 refills | Status: DC
  Filled 2023-01-21: qty 946, 42d supply, fill #0

## 2023-01-21 MED ORDER — 0.9 % SODIUM CHLORIDE (POUR BTL) OPTIME
TOPICAL | Status: DC | PRN
  Administered 2023-01-21: 1000 mL

## 2023-01-21 MED ORDER — CHLORHEXIDINE GLUCONATE 0.12 % MT SOLN
15.0000 mL | Freq: Once | OROMUCOSAL | Status: AC
  Administered 2023-01-21: 15 mL via OROMUCOSAL

## 2023-01-21 MED ORDER — ONDANSETRON HCL 4 MG/2ML IJ SOLN
INTRAMUSCULAR | Status: DC | PRN
  Administered 2023-01-21: 4 mg via INTRAVENOUS

## 2023-01-21 MED ORDER — BUPIVACAINE HCL (PF) 0.5 % IJ SOLN
INTRAMUSCULAR | Status: DC | PRN
  Administered 2023-01-21: 15 mL via PERINEURAL

## 2023-01-21 MED ORDER — ACETAMINOPHEN 325 MG PO TABS: 325.0000 mg | ORAL_TABLET | Freq: Four times a day (QID) | ORAL | Status: DC | PRN

## 2023-01-21 MED ORDER — DEXAMETHASONE SODIUM PHOSPHATE 10 MG/ML IJ SOLN
INTRAMUSCULAR | Status: DC | PRN
  Administered 2023-01-21: 5 mg via INTRAVENOUS

## 2023-01-21 MED ORDER — METOPROLOL SUCCINATE ER 50 MG PO TB24
50.0000 mg | ORAL_TABLET | Freq: Every day | ORAL | Status: DC
  Administered 2023-01-22: 50 mg via ORAL
  Filled 2023-01-21: qty 1

## 2023-01-21 MED ORDER — ONDANSETRON HCL 4 MG/2ML IJ SOLN: 4.0000 mg | Freq: Once | INTRAMUSCULAR | Status: DC | PRN

## 2023-01-21 MED ORDER — CEFAZOLIN SODIUM-DEXTROSE 2-4 GM/100ML-% IV SOLN
2.0000 g | INTRAVENOUS | Status: AC
  Administered 2023-01-21: 2 g via INTRAVENOUS
  Filled 2023-01-21: qty 100

## 2023-01-21 MED ORDER — ONDANSETRON HCL 4 MG PO TABS: 4.0000 mg | ORAL_TABLET | Freq: Four times a day (QID) | ORAL | Status: DC | PRN

## 2023-01-21 MED ORDER — FENTANYL CITRATE (PF) 100 MCG/2ML IJ SOLN
INTRAMUSCULAR | Status: AC
  Filled 2023-01-21: qty 2

## 2023-01-21 MED ORDER — ORAL CARE MOUTH RINSE: 15.0000 mL | Freq: Once | OROMUCOSAL | Status: AC

## 2023-01-21 MED ORDER — ONDANSETRON HCL 4 MG/2ML IJ SOLN: 4.0000 mg | Freq: Four times a day (QID) | INTRAMUSCULAR | Status: DC | PRN

## 2023-01-21 MED ORDER — METOCLOPRAMIDE HCL 5 MG PO TABS: 5.0000 mg | ORAL_TABLET | Freq: Three times a day (TID) | ORAL | Status: DC | PRN

## 2023-01-21 MED ORDER — BENEFIBER PO POWD: 8.0000 g | ORAL | Status: DC

## 2023-01-21 MED ORDER — MUPIROCIN 2 % EX OINT
1.0000 | TOPICAL_OINTMENT | Freq: Two times a day (BID) | CUTANEOUS | 0 refills | Status: AC
  Filled 2023-01-21: qty 44, 22d supply, fill #0

## 2023-01-21 MED ORDER — FENTANYL CITRATE PF 50 MCG/ML IJ SOSY: 25.0000 ug | PREFILLED_SYRINGE | INTRAMUSCULAR | Status: DC | PRN

## 2023-01-21 MED ORDER — VANCOMYCIN HCL 1000 MG IV SOLR
INTRAVENOUS | Status: DC | PRN
  Administered 2023-01-21: 1000 mg via TOPICAL

## 2023-01-21 MED ORDER — STERILE WATER FOR IRRIGATION IR SOLN
Status: DC | PRN
  Administered 2023-01-21: 1000 mL

## 2023-01-21 MED ORDER — OXYCODONE HCL 5 MG/5ML PO SOLN: 5.0000 mg | Freq: Once | ORAL | Status: DC | PRN

## 2023-01-21 MED ORDER — MENTHOL 3 MG MT LOZG: 1.0000 | LOZENGE | OROMUCOSAL | Status: DC | PRN

## 2023-01-21 MED ORDER — ACETAMINOPHEN 500 MG PO TABS
1000.0000 mg | ORAL_TABLET | Freq: Once | ORAL | Status: AC
  Administered 2023-01-21: 1000 mg via ORAL
  Filled 2023-01-21: qty 2

## 2023-01-21 MED ORDER — VANCOMYCIN HCL 1000 MG IV SOLR
INTRAVENOUS | Status: DC | PRN
  Administered 2023-01-21: 1000 mg via INTRAVENOUS

## 2023-01-21 MED ORDER — TRANEXAMIC ACID-NACL 1000-0.7 MG/100ML-% IV SOLN
1000.0000 mg | INTRAVENOUS | Status: AC
  Administered 2023-01-21: 1000 mg via INTRAVENOUS
  Filled 2023-01-21: qty 100

## 2023-01-21 MED ORDER — PHENOL 1.4 % MT LIQD: 1.0000 | OROMUCOSAL | Status: DC | PRN

## 2023-01-21 MED ORDER — LIDOCAINE HCL (CARDIAC) PF 100 MG/5ML IV SOSY
PREFILLED_SYRINGE | INTRAVENOUS | Status: DC | PRN
  Administered 2023-01-21: 40 mg via INTRAVENOUS

## 2023-01-21 MED ORDER — BUPIVACAINE LIPOSOME 1.3 % IJ SUSP
INTRAMUSCULAR | Status: DC | PRN
  Administered 2023-01-21: 10 mL via PERINEURAL

## 2023-01-21 MED ORDER — PHENYLEPHRINE HCL (PRESSORS) 10 MG/ML IV SOLN
INTRAVENOUS | Status: DC | PRN
  Administered 2023-01-21: 80 ug via INTRAVENOUS

## 2023-01-21 MED ORDER — ROCURONIUM BROMIDE 100 MG/10ML IV SOLN
INTRAVENOUS | Status: DC | PRN
  Administered 2023-01-21: 40 mg via INTRAVENOUS

## 2023-01-21 MED ORDER — VANCOMYCIN HCL 1000 MG IV SOLR
INTRAVENOUS | Status: AC
  Filled 2023-01-21: qty 20

## 2023-01-21 MED ORDER — PROPOFOL 10 MG/ML IV BOLUS
INTRAVENOUS | Status: DC | PRN
  Administered 2023-01-21: 80 mg via INTRAVENOUS
  Administered 2023-01-21: 50 ug/kg/min via INTRAVENOUS

## 2023-01-21 SURGICAL SUPPLY — 69 items
ADH SKN CLS APL DERMABOND .7 (GAUZE/BANDAGES/DRESSINGS) ×1
AID PSTN UNV HD RSTRNT DISP (MISCELLANEOUS) ×1
BAG COUNTER SPONGE SURGICOUNT (BAG) IMPLANT
BAG SPEC THK2 15X12 ZIP CLS (MISCELLANEOUS) ×1
BAG SPNG CNTER NS LX DISP (BAG) ×1
BAG ZIPLOCK 12X15 (MISCELLANEOUS) ×2 IMPLANT
BIT DRILL AR 3 NS (BIT) IMPLANT
BLADE SAW SGTL 83.5X18.5 (BLADE) ×2 IMPLANT
BNDG CMPR 5X4 CHSV STRCH STRL (GAUZE/BANDAGES/DRESSINGS) ×1
BNDG COHESIVE 4X5 TAN STRL LF (GAUZE/BANDAGES/DRESSINGS) ×2 IMPLANT
BSPLAT GLND +2X24 MDLR (Joint) ×1 IMPLANT
COOLER ICEMAN CLASSIC (MISCELLANEOUS) ×2 IMPLANT
COVER BACK TABLE 60X90IN (DRAPES) ×2 IMPLANT
COVER SURGICAL LIGHT HANDLE (MISCELLANEOUS) ×2 IMPLANT
CUP SUT UNIV REVERS 36 NEUTRAL (Cup) IMPLANT
DERMABOND ADVANCED .7 DNX12 (GAUZE/BANDAGES/DRESSINGS) ×2 IMPLANT
DRAPE ORTHO SPLIT 77X108 STRL (DRAPES) ×2
DRAPE SHEET LG 3/4 BI-LAMINATE (DRAPES) ×2 IMPLANT
DRAPE SURG 17X11 SM STRL (DRAPES) ×2 IMPLANT
DRAPE SURG ORHT 6 SPLT 77X108 (DRAPES) ×4 IMPLANT
DRAPE TOP 10253 STERILE (DRAPES) ×2 IMPLANT
DRAPE U-SHAPE 47X51 STRL (DRAPES) ×2 IMPLANT
DRESSING AQUACEL AG SP 3.5X6 (GAUZE/BANDAGES/DRESSINGS) ×2 IMPLANT
DRSG AQUACEL AG ADV 3.5X 6 (GAUZE/BANDAGES/DRESSINGS) IMPLANT
DRSG AQUACEL AG ADV 3.5X10 (GAUZE/BANDAGES/DRESSINGS) IMPLANT
DRSG AQUACEL AG SP 3.5X6 (GAUZE/BANDAGES/DRESSINGS) ×1
DURAPREP 26ML APPLICATOR (WOUND CARE) ×2 IMPLANT
ELECT BLADE TIP CTD 4 INCH (ELECTRODE) ×2 IMPLANT
ELECT PENCIL ROCKER SW 15FT (MISCELLANEOUS) ×2 IMPLANT
ELECT REM PT RETURN 15FT ADLT (MISCELLANEOUS) ×2 IMPLANT
FACESHIELD WRAPAROUND (MASK) ×5
FACESHIELD WRAPAROUND OR TEAM (MASK) ×10 IMPLANT
GLENOID UNI REV MOD 24 +2 LAT (Joint) IMPLANT
GLENOSPHERE 36 +4 LAT/24 (Joint) IMPLANT
GLOVE BIO SURGEON STRL SZ7.5 (GLOVE) ×2 IMPLANT
GLOVE BIO SURGEON STRL SZ8 (GLOVE) ×2 IMPLANT
GLOVE SS BIOGEL STRL SZ 7 (GLOVE) ×2 IMPLANT
GLOVE SS BIOGEL STRL SZ 7.5 (GLOVE) ×2 IMPLANT
GOWN STRL SURGICAL XL XLNG (GOWN DISPOSABLE) ×4 IMPLANT
INSERT HUMERAL UNI REVERS 36 3 (Insert) IMPLANT
KIT BASIN OR (CUSTOM PROCEDURE TRAY) ×2 IMPLANT
KIT TURNOVER KIT A (KITS) IMPLANT
MANIFOLD NEPTUNE II (INSTRUMENTS) ×2 IMPLANT
NDL TAPERED W/ NITINOL LOOP (MISCELLANEOUS) ×2 IMPLANT
NEEDLE TAPERED W/ NITINOL LOOP (MISCELLANEOUS) ×1
NS IRRIG 1000ML POUR BTL (IV SOLUTION) ×2 IMPLANT
PACK SHOULDER (CUSTOM PROCEDURE TRAY) ×2 IMPLANT
PAD ARMBOARD 7.5X6 YLW CONV (MISCELLANEOUS) ×2 IMPLANT
PAD COLD SHLDR WRAP-ON (PAD) ×2 IMPLANT
PIN NITINOL TARGETER 2.8 (PIN) IMPLANT
PIN SET MODULAR GLENOID SYSTEM (PIN) IMPLANT
RESTRAINT HEAD UNIVERSAL NS (MISCELLANEOUS) ×2 IMPLANT
SCREW CENTRAL MODULAR 25 (Screw) IMPLANT
SCREW PERI LOCK 5.5X16 (Screw) IMPLANT
SCREW PERI LOCK 5.5X32 (Screw) IMPLANT
SCREW PERIPHERAL 5.5X28 LOCK (Screw) IMPLANT
SLING ARM FOAM STRAP LRG (SOFTGOODS) IMPLANT
SLING ARM FOAM STRAP MED (SOFTGOODS) IMPLANT
STEM HUMERAL MOD SZ 5 135 DEG (Stem) IMPLANT
SUT MNCRL AB 3-0 PS2 18 (SUTURE) ×2 IMPLANT
SUT MON AB 2-0 CT1 36 (SUTURE) ×2 IMPLANT
SUT VIC AB 1 CT1 36 (SUTURE) ×2 IMPLANT
SUTURE TAPE 1.3 40 TPR END (SUTURE) ×4 IMPLANT
SUTURETAPE 1.3 40 TPR END (SUTURE) ×2
TOWEL OR 17X26 10 PK STRL BLUE (TOWEL DISPOSABLE) ×2 IMPLANT
TOWEL OR NON WOVEN STRL DISP B (DISPOSABLE) ×2 IMPLANT
TUBE SUCTION HIGH CAP CLEAR NV (SUCTIONS) ×2 IMPLANT
TUBING CONNECTING 10 (TUBING) ×2 IMPLANT
WATER STERILE IRR 1000ML POUR (IV SOLUTION) ×4 IMPLANT

## 2023-01-21 NOTE — Transfer of Care (Signed)
Immediate Anesthesia Transfer of Care Note  Patient: Alexis Hines  Procedure(s) Performed: REVERSE SHOULDER ARTHROPLASTY (Right: Shoulder)  Patient Location: PACU  Anesthesia Type:General  Level of Consciousness: drowsy  Airway & Oxygen Therapy: Patient Spontanous Breathing and Patient connected to face mask  Post-op Assessment: Report given to RN  Post vital signs: Reviewed and stable  Last Vitals:  Vitals Value Taken Time  BP 115/44 01/21/23 1130  Temp    Pulse 118 01/21/23 1132  Resp 21 01/21/23 1133  SpO2 98 % 01/21/23 1132  Vitals shown include unfiled device data.  Last Pain:  Vitals:   01/21/23 0925  TempSrc:   PainSc: 0-No pain      Patients Stated Pain Goal: 5 (01/21/23 4782)  Complications: No notable events documented.

## 2023-01-21 NOTE — Progress Notes (Signed)
Report given via phone to 3W RN

## 2023-01-21 NOTE — H&P (Signed)
Alexis Hines    Chief Complaint: Right shoulder rotator cuff tear arthropathy HPI: The patient is a 89 y.o. female well-known to our practice after a previous left shoulder reverse arthroplasty, now with chronic and progressive increasing right shoulder pain related to severe rotator cuff tear arthropathy.  Due to her increasing functional limitations and failure to respond to prolonged attempts of conservative management, she is brought to the operating this time for planned right shoulder reverse arthroplasty.  Past Medical History:  Diagnosis Date   Abnormal TSH 2004   normal since   Arthritis    Basal cell cancer    nose; Dr. Terri Piedra    Diverticulosis 10/2005   Dizziness    Essential hypertension 09/03/2022   Migraine childhood   resolved   PAC (premature atrial contraction)    frequent on cardiac monitor 09/14/19   Pure hypercholesterolemia    Radial styloid tenosynovitis (de quervain) 11/2018   injected by Dr. Merlyn Lot 11/2018, 02/2019   SCCA (squamous cell carcinoma) of skin 12/2015   right temple   Squamous cell carcinoma of skin 02/15/2017   in situ-left jaw (CX35FU)      Past Surgical History:  Procedure Laterality Date   BREAST BIOPSY Left 1984   benign   CHOLECYSTECTOMY N/A 01/27/2022   Procedure: LAPAROSCOPIC CHOLECYSTECTOMY;  Surgeon: Quentin Ore, MD;  Location: [facility];  Service: General;  Laterality: N/A;   EYE SURGERY     Bil and eyelids lifted   IR RADIOLOGIST EVAL & MGMT  03/05/2022   left hip intra-articular steriod injection  03/13/2014   Mohs procedure     REVERSE SHOULDER ARTHROPLASTY Left 12/26/2020   Procedure: REVERSE SHOULDER ARTHROPLASTY;  Surgeon: Francena Hanly, MD;  Location: [facility];  Service: Orthopedics;  Laterality: Left;    TONSILLECTOMY  age 59   TOTAL HIP ARTHROPLASTY Right 10/10/2019   Procedure: TOTAL HIP ARTHROPLASTY ANTERIOR APPROACH;  Surgeon: Durene Romans, MD;  Location: [facility];  Service: Orthopedics;  Laterality: Right;   70 mins   TOTAL KNEE ARTHROPLASTY Left 07/2003   Dr. Thomasena Edis   TOTAL KNEE ARTHROPLASTY Right 09/03/2017   Procedure: RIGHT TOTAL KNEE ARTHROPLASTY;  Surgeon: Eugenia Mcalpine, MD;  Location: [facility];  Service: Orthopedics;  Laterality: Right;    Family History  Problem Relation Age of Onset   Cancer Sister        bone cancer L leg with mets to lung, diagnosed age 80; s/p amputation   Hyperlipidemia Brother    Heart disease Brother 42       CABG   Cancer Sister 48       breast cancer and lung cancer (lung dx'd first)   Rheum arthritis Daughter    Gout Son     Social History:  reports that she has never smoked. She has never used smokeless tobacco. She reports current alcohol use. She reports that she does not use drugs.  BMI: Estimated body mass index is 23.83 kg/m as calculated from the following:   Height as of 01/14/23: 4\' 10"  (1.473 m).   Weight as of 01/14/23: 51.7 kg.  Lab Results  Component Value Date   ALBUMIN 3.9 09/04/2022   Diabetes: Patient does not have a diagnosis of diabetes.     Smoking Status:   reports that she has never smoked. She has never used smokeless tobacco.     No medications prior to admission.     Physical Exam: Right shoulder demonstrates painful and guarded motion as noted  after recent office visits.  She has globally decreased strength to manual muscle testing.  Imaging studies confirm changes consistent with chronic right shoulder rotator cuff tear arthropathy.  Vitals     Assessment/Plan  Impression: Right shoulder rotator cuff tear arthropathy  Plan of Action: Procedure(s): REVERSE SHOULDER ARTHROPLASTY  Jahsiah Carpenter M Collyn Selk 01/21/2023, 6:16 AM Contact # [phone]

## 2023-01-21 NOTE — Anesthesia Procedure Notes (Signed)
Procedure Name: Intubation Date/Time: 01/21/2023 9:53 AM  Performed by: Randa Evens, CRNAPre-anesthesia Checklist: Patient identified, Emergency Drugs available, Suction available, Patient being monitored and Timeout performed Patient Re-evaluated:Patient Re-evaluated prior to induction Oxygen Delivery Method: Circle system utilized Preoxygenation: Pre-oxygenation with 100% oxygen Induction Type: IV induction Ventilation: Mask ventilation without difficulty Laryngoscope Size: Mac and 3 Grade View: Grade I Tube type: Oral Number of attempts: 1 Airway Equipment and Method: Stylet Placement Confirmation: ETT inserted through vocal cords under direct vision Secured at: 20 cm Tube secured with: Tape

## 2023-01-21 NOTE — Discharge Instructions (Signed)

## 2023-01-21 NOTE — Op Note (Signed)
01/21/2023  11:10 AM  PATIENT:   Alexis Hines  89 y.o. female  PRE-OPERATIVE DIAGNOSIS:  Right shoulder rotator cuff tear arthropathy  POST-OPERATIVE DIAGNOSIS: Same  PROCEDURE: Right shoulder reverse arthroplasty lysing a press-fit size 5.5 modular Arthrex stem with a neutral metathesis, +3 constrained polyethylene insert, 36/+4 glenosphere and a small/+2 baseplate  SURGEON:  Taylorann Tkach, Vania Rea M.D.  ASSISTANTS: Ralene Bathe, PA-C  Ralene Bathe, PA-C was utilized as an Geophysicist/field seismologist throughout this case, essential for help with positioning the patient, positioning extremity, tissue manipulation, implantation of the prosthesis, suture management, wound closure, and intraoperative decision-making.  ANESTHESIA:   General Endotracheal and interscalene block with Exparel  EBL: 100 cc  SPECIMEN: None  Drains: None   PATIENT DISPOSITION:  PACU - hemodynamically stable.    PLAN OF CARE: Admit for overnight observation  Brief history:  Patient is an 89 year old female well-known to our practice after previous left shoulder reverse arthroplasty which she has done well with.  Now presents with chronic and progressively increasing right shoulder pain related to severe osteoarthritis and rotator cuff failure.  She is brought to the operating room today for planned right shoulder reverse arthroplasty.  Preoperatively, I counseled the patient regarding treatment options and risks versus benefits thereof.  Possible surgical complications were all reviewed including potential for bleeding, infection, neurovascular injury, persistent pain, loss of motion, anesthetic complication, failure of the implant, and possible need for additional surgery. They understand and accept and agrees with our planned procedure.   Procedure in detail:  After undergoing routine preop evaluation the patient received prophylactic antibiotics and interscalene block with Exparel was established in the holding area  by the anesthesia department.  Subsequently placed spine on the operating table and underwent the smooth induction of a general endotracheal anesthesia.  Placed into the beachchair position and appropriately padded and protected.  Right shoulder girdle region was sterilely prepped and draped in standard fashion.  Timeout was called.  Deltopectoral approach to the right shoulder is made an approximately 7 cm incision.  Skin flaps were elevated dissection carried deeply and the deltopectoral interval was then developed from proximal to distal with the vein taken laterally.  The conjoined tendon was evaluated and there had been anterosuperior subluxation of the humeral head and as we progressed with the exposure and retracted the conjoined tendon the tip of the coracoid avulsed with the conjoined tendon.  With this finding we used a Krakw whipstitch through the fragment of bone from the tip of the coracoid up through the proximal conjoined tendon for later repair.  The long head biceps tendon was then tenodesed at the upper border the pectoralis major tendon with the proximal segment unroofed and excised.  The subscapularis remnant was separated from the lesser tuberosity and tagged with a pair of grasping suture tape sutures.  Capsular attachments were then divided from the anterior and inferior margins of the humeral neck allowing deliver the humeral head through the wound.  As we proceeded with the exposure it became clear that the bone was extremely osteoporotic.  We carefully delivered head through the wound using extra medullary guide to outline the proposed humeral head resection which we performed with an oscillating saw at approximately 20 degrees of retroversion.  Marginal osteophytes were removed with a rondure and a metal cap was then placed with the cut proximal humeral surface.  The glenoid was then exposed and a circumferential labral resection was performed.  A guidepin was then directed into the  center  of the glenoid and the glenoid was then reamed with the central followed by the peripheral reamer to a stable subchondral bony bed.  Preparation completed with a drill and tapped for a 25 mm lag screw.  The joint was irrigated.  Our baseplate was assembled and inserted with vancomycin powder applied to the threads of the lag screw.  Good purchase was achieved.  All of the prophylaxis was then placed using standard technique with excellent fixation.  A 36/+4 glenosphere was then impacted onto the baseplate and a central locking screw was placed.  We returned attention back to the proximal humerus where the canal was opened and we broached up to a size 5.5 stem which match the opposite side.  A neutral metaphyseal reaming guide was then used to pare the metaphysis.  Trial implant was placed and trial reduction showed good motion stability and soft tissue balance.  Trial was then removed.  The canal was irrigated cleaned and dried.  Vancomycin powder sprayed into the canal.  Our final implant was assembled and inserted with excellent fixation.  Trial reduction showed good stability with a +3 poly.  We took the trial out and placed a +3 constrained poly impacted this onto the implant and performed our final reduction which again showed excellent motor stability and soft tissue balance.  This point the conjoined tendon was then mobilized and repaired back through the base of the coracoid through bone tunnels using the previously placed suture tape suture.  Subscapularis repaired back to the eyelets on the collar of the implant.  The balance of the vancomycin powder was then sprayed liberally throughout the deep soft tissue planes.  The deltopectoral interval was reapproximated with a series of figure-of-eight number Vicryl sutures.  2-0 Monocryl used to close the subcu layer and intracuticular 3-0 Monocryl used to close the skin followed by Dermabond and Aquacel dressing.  The right arm was placed into a sling.   The patient was awakened, extubated, and taken to the prep room in stable condition.  Senaida Lange MD   Contact # [phone]

## 2023-01-21 NOTE — Anesthesia Postprocedure Evaluation (Signed)
Anesthesia Post Note  Patient: Alexis Hines  Procedure(s) Performed: REVERSE SHOULDER ARTHROPLASTY (Right: Shoulder)     Patient location during evaluation: PACU Anesthesia Type: General Level of consciousness: awake and alert Pain management: pain level controlled Vital Signs Assessment: post-procedure vital signs reviewed and stable Respiratory status: spontaneous breathing, nonlabored ventilation and respiratory function stable Cardiovascular status: stable, blood pressure returned to baseline and bradycardic Anesthetic complications: no   No notable events documented.  Last Vitals:  Vitals:   01/21/23 1200 01/21/23 1230  BP: 113/71 (!) 120/57  Pulse: (!) 37 (!) 33  Resp: 17 13  Temp: 36.5 C   SpO2: 93% 96%    Last Pain:  Vitals:   01/21/23 1230  TempSrc:   PainSc: Asleep                 Beryle Lathe

## 2023-01-21 NOTE — Anesthesia Procedure Notes (Signed)
Anesthesia Regional Block: Interscalene brachial plexus block   Pre-Anesthetic Checklist: , timeout performed,  Correct Patient, Correct Site, Correct Laterality,  Correct Procedure, Correct Position, site marked,  Risks and benefits discussed,  Surgical consent,  Pre-op evaluation,  At surgeon's request and post-op pain management  Laterality: Right  Prep: chloraprep       Needles:  Injection technique: Single-shot  Needle Type: Echogenic Needle     Needle Length: 5cm  Needle Gauge: 21     Additional Needles:   Narrative:  Start time: 01/21/2023 9:07 AM End time: 01/21/2023 9:10 AM Injection made incrementally with aspirations every 5 mL.  Performed by: Personally  Anesthesiologist: Beryle Lathe, MD  Additional Notes: No pain on injection. No increased resistance to injection. Injection made in 5cc increments. Good needle visualization. Patient tolerated the procedure well.

## 2023-01-22 ENCOUNTER — Encounter (HOSPITAL_COMMUNITY): Payer: Self-pay | Admitting: Orthopedic Surgery

## 2023-01-22 ENCOUNTER — Other Ambulatory Visit (HOSPITAL_COMMUNITY): Payer: Self-pay

## 2023-01-22 DIAGNOSIS — M75101 Unspecified rotator cuff tear or rupture of right shoulder, not specified as traumatic: Secondary | ICD-10-CM | POA: Diagnosis not present

## 2023-01-22 MED ORDER — OXYCODONE-ACETAMINOPHEN 5-325 MG PO TABS
1.0000 | ORAL_TABLET | ORAL | 0 refills | Status: DC | PRN
  Filled 2023-01-22: qty 20, 4d supply, fill #0

## 2023-01-22 NOTE — Discharge Summary (Signed)
PATIENT ID:      Alexis Hines  MRN:     [id number] DOB/AGE:    [birth year] / 89 y.o.     DISCHARGE SUMMARY  ADMISSION DATE:    01/21/2023 DISCHARGE DATE:  01/22/2023  ADMISSION DIAGNOSIS: Right shoulder rotator cuff tear arthropathy Past Medical History:  Diagnosis Date   Abnormal TSH 2004   normal since   Arthritis    Basal cell cancer    nose; Dr. Terri Piedra    Diverticulosis 10/2005   Dizziness    Essential hypertension 09/03/2022   Migraine childhood   resolved   PAC (premature atrial contraction)    frequent on cardiac monitor 09/14/19   Pure hypercholesterolemia    Radial styloid tenosynovitis (de quervain) 11/2018   injected by Dr. Merlyn Lot 11/2018, 02/2019   SCCA (squamous cell carcinoma) of skin 12/2015   right temple   Squamous cell carcinoma of skin 02/15/2017   in situ-left jaw ([id number])    DISCHARGE DIAGNOSIS:   Principal Problem:   S/P reverse total shoulder arthroplasty, right   PROCEDURE: Procedure(s): REVERSE SHOULDER ARTHROPLASTY on 01/21/2023  CONSULTS:    HISTORY:  See H&P in chart.  HOSPITAL COURSE:  [name] is a 89 y.o. admitted on 01/21/2023 with a diagnosis of Right shoulder rotator cuff tear arthropathy.  They were brought to the operating room on 01/21/2023 and underwent Procedure(s): REVERSE SHOULDER ARTHROPLASTY.    They were given perioperative antibiotics:  Anti-infectives (From admission, onward)    Start     Dose/Rate Route Frequency Ordered Stop   01/21/23 1014  vancomycin (VANCOCIN) powder  Status:  Discontinued          As needed 01/21/23 1014 01/21/23 1244   01/21/23 0815  ceFAZolin (ANCEF) IVPB 2g/100 mL premix        2 g 200 mL/hr over 30 Minutes Intravenous On call to O.R. 01/21/23 2841 01/21/23 0954     .  Patient underwent the above named procedure and tolerated it well. The following day they were hemodynamically stable and pain was controlled on oral analgesics. They were neurovascularly intact to the operative  extremity. OT was ordered and worked with patient per protocol. They were medically and orthopaedically stable for discharge on .    DIAGNOSTIC STUDIES:  RECENT RADIOGRAPHIC STUDIES :  No results found.  RECENT VITAL SIGNS:  Patient Vitals for the past 24 hrs:  BP Temp Temp src Pulse Resp SpO2 Height Weight  01/22/23 0521 123/61 97.6 F (36.4 C) Oral 66 16 94 % -- --  01/21/23 2130 (!) 105/50 (!) 97.5 F (36.4 C) Oral 69 15 95 % -- --  01/21/23 1508 113/61 -- -- 64 18 96 % -- --  01/21/23 1322 (!) 121/50 (!) 97.4 F (36.3 C) Oral (!) 56 14 96 % -- --  01/21/23 1230 (!) 120/57 -- -- (!) 33 13 96 % -- --  01/21/23 1200 113/71 97.7 F (36.5 C) -- (!) 37 17 93 % -- --  01/21/23 1145 (!) 111/46 -- -- (!) 36 (!) 22 94 % -- --  01/21/23 1130 (!) 115/44 97.6 F (36.4 C) -- (!) 28 (!) 21 100 % -- --  01/21/23 0925 137/65 -- -- (!) 57 16 99 % -- --  01/21/23 0920 (!) 142/58 -- -- (!) 53 18 96 % -- --  01/21/23 0915 (!) 156/67 -- -- -- 20 -- -- --  01/21/23 0910 -- -- -- -- 16 -- -- --  01/21/23 3244 -- -- -- -- -- --  4\' 10"  (1.473 m) 51.7 kg  01/21/23 0817 (!) 129/58 (!) 97.4 F (36.3 C) Oral 78 (!) 21 96 % -- --  .  RECENT EKG RESULTS:    Orders placed or performed in visit on 01/01/23   EKG 12-Lead    DISCHARGE INSTRUCTIONS:    DISCHARGE MEDICATIONS:   Allergies as of 01/22/2023   No Known Allergies      Medication List     TAKE these medications    atorvastatin 40 MG tablet Commonly known as: LIPITOR TAKE 1 TABLET BY MOUTH AT  BEDTIME   BENEFIBER PO Take 8-10 g by mouth See admin instructions. Mix 8-10 grams (2 teaspoonsful) of powder into applesauce or the suggested amount of warm water and consume 2 times a day   Biotin 16109 MCG Tabs Take 10,000 mcg by mouth in the morning.   CALTRATE 600+D PO Take 1 tablet by mouth daily.   chlorhexidine 4 % external liquid Commonly known as: HIBICLENS Apply 15 mLs (1 Application total) topically as directed for 30  doses. Use as directed daily for 5 days every other week for 6 weeks.   ipratropium 0.06 % nasal spray Commonly known as: ATROVENT Use 2 sprays in each nostril 3 to 4 times daily for runny nose   metoprolol succinate 50 MG 24 hr tablet Commonly known as: TOPROL-XL Take 1 tablet (50 mg total) by mouth daily.   multivitamin with minerals Tabs tablet Take 1 tablet by mouth daily with breakfast. CENTRUM MULTIVITAMIN   mupirocin ointment 2 % Commonly known as: BACTROBAN Place 1 Application into the nose 2 (two) times daily for 60 doses. Use as directed 2 times daily for 5 days every other week for 6 weeks.   oxyCODONE-acetaminophen 5-325 MG tablet Commonly known as: Percocet Take 1 tablet by mouth every 4 (four) hours as needed (max 6 q).   PreviDent 5000 Booster Plus 1.1 % Pste Generic drug: Sodium Fluoride Place 1 application  onto teeth every 3 (three) days.   PROBIOTIC PO Take 1 capsule by mouth daily.   ZINC-VITAMIN C PO Take 1 tablet by mouth daily.        FOLLOW UP VISIT:    Follow-up Information     Francena Hanly, MD Follow up.   Specialty: Orthopedic Surgery Why: 02-03-2023 at 10:00 AM for 5-post-op Contact information: [address] [place] [address] [place] [address] [phone]                 DISCHARGE GN:FAOZ   DISCHARGE CONDITION:  Alexis Hines for Dr. Francena Hanly 01/22/2023, 7:39 AM

## 2023-01-22 NOTE — Evaluation (Signed)
Occupational Therapy Evaluation Patient Details Name: Alexis Hines MRN: [id number] DOB: 1933/12/01 Today's Date: 01/22/2023   History of Present Illness 89 y.o. female s/p R reverse total shoulder arthroplasty.   Clinical Impression   s/p shoulder replacement without functional use of RUE secondary to effects of surgery and interscalene block and shoulder precautions. Therapist provided education and instruction to patient and family in regards to exercises, precautions, positioning, donning upper extremity clothing and bathing while maintaining shoulder precautions, ice and edema management, use of ice machine and donning/doffing sling. Patient and family verbalized understanding and demonstrated as needed. Patient needed assistance to donn shirt, underwear, pants, socks and shoes and provided with instruction on compensatory strategies to perform ADLs. Patient to follow up with MD for further therapy needs.         If plan is discharge home, recommend the following: A little help with walking and/or transfers;A little help with bathing/dressing/bathroom;Assistance with cooking/housework;Help with stairs or ramp for entrance;Assist for transportation    Functional Status Assessment  Patient has had a recent decline in their functional status and demonstrates the ability to make significant improvements in function in a reasonable and predictable amount of time.  Equipment Recommendations  None recommended by OT       Precautions / Restrictions Precautions Precautions: Shoulder Type of Shoulder Precautions: post op shoulder protocol. Shoulder ROM ok within the following parameters: Flexion: 0-60*, abduction 0-45*, er 0-20*, A/ROM OK with elbow, wrist, hand, WB restrictions, laps slides. Shoulder Interventions: Shoulder sling/immobilizer;At all times;Off for dressing/bathing/exercises Precaution Booklet Issued: Yes (comment) Precaution Comments: verbal instruction and education  provided to patient, Son, ad Daughter during session regarding shoulder precautions and post op restrictions. Handout provided for reference. Required Braces or Orthoses: Sling Restrictions Weight Bearing Restrictions: Yes RUE Weight Bearing: Non weight bearing      Mobility       Transfers Overall transfer level: Needs assistance Equipment used: None Transfers: Sit to/from Stand, Bed to chair/wheelchair/BSC Sit to Stand: Supervision     Step pivot transfers: Contact guard assist     General transfer comment: Pt gentling used wall and furniture in room to steady herself when walking in room and bathroom.      Balance Overall balance assessment: Mild deficits observed, not formally tested        ADL either performed or assessed with clinical judgement   ADL Overall ADL's : Needs assistance/impaired     Grooming: Oral care;Standing;Supervision/safety Grooming Details (indicate cue type and reason): stood at sink         Upper Body Dressing : Moderate assistance;Cueing for sequencing;Cueing for UE precautions;Sitting Upper Body Dressing Details (indicate cue type and reason): education and VC provided on compensatory technique while donning button up shirt and donnin sling. Used sink counter to don sling with assist and used mirror in bathroom for visual feedback on appropriate UE positioning in sling. Lower Body Dressing: Supervision/safety;Sit to/from stand    General ADL Comments: Family will be available to assist pt at home as needed.     Vision Baseline Vision/History: 1 Wears glasses Ability to See in Adequate Light: 0 Adequate Patient Visual Report: No change from baseline Vision Assessment?: Wears glasses for reading     Perception Perception: Not tested       Praxis Praxis: Not tested       Pertinent Vitals/Pain Pain Assessment Pain Assessment: Faces Faces Pain Scale: Hurts a little bit Pain Location: right shoulder when leaning forward to don  pants/underwear. Pain  Descriptors / Indicators: Grimacing Pain Intervention(s): Monitored during session, Premedicated before session, Ice applied, Repositioned     Extremity/Trunk Assessment Upper Extremity Assessment Upper Extremity Assessment: Right hand dominant;RUE deficits/detail RUE Deficits / Details: post op shoulder surgery restrictions. Nerve block still in effect. Pt able to demonstrate active finger/hand movement during evaluation. RUE: Unable to fully assess due to immobilization RUE Coordination: decreased gross motor   Lower Extremity Assessment Lower Extremity Assessment: Generalized weakness   Cervical / Trunk Assessment Cervical / Trunk Assessment: Normal   Communication Communication Communication: Hearing impairment Cueing Techniques: Verbal cues   Cognition Arousal: Alert Behavior During Therapy: WFL for tasks assessed/performed Overall Cognitive Status: Within Functional Limits for tasks assessed            General Comments: Did have some memory difficulty although hearing impairment does impact memory storage and getting all information. Pt did not have her hearing aids in.     General Comments  Post op shoulder swelling present. Surgial dressing intact and in place on anterior shoulder.            Home Living Family/patient expects to be discharged to:: Private residence Living Arrangements: Alone Available Help at Discharge: Family;Available 24 hours/day;Available PRN/intermittently (Family able to assist 24/7 initially then daughter will assist in AM and evenings when she is not at work.) Type of Home: House           Prior Functioning/Environment Prior Level of Function : Independent/Modified Independent           OT Problem List: Impaired UE functional use         OT Goals(Current goals can be found in the care plan section) Acute Rehab OT Goals Patient Stated Goal: to go home OT Goal Formulation: All assessment and education  complete, DC therapy  OT Frequency:  1X visit       AM-PAC OT "6 Clicks" Daily Activity     Outcome Measure Help from another person eating meals?: A Little Help from another person taking care of personal grooming?: A Little Help from another person toileting, which includes using toliet, bedpan, or urinal?: A Little Help from another person bathing (including washing, rinsing, drying)?: A Little Help from another person to put on and taking off regular upper body clothing?: A Lot Help from another person to put on and taking off regular lower body clothing?: A Little 6 Click Score: 17   End of Session Equipment Utilized During Treatment: Other (comment) (sling) Nurse Communication: Other (comment) (ready to d/c from a therapy standpoint.)  Activity Tolerance: Patient tolerated treatment well Patient left: in chair;with call bell/phone within reach;with family/visitor present  OT Visit Diagnosis: Muscle weakness (generalized) (M62.81)                Time: 1610-9604 OT Time Calculation (min): 46 min Charges:  OT General Charges $OT Visit: 1 Visit OT Evaluation $OT Eval High Complexity: 1 High  [employer], OTR/L,CBIS  Supplemental OT - [facility] and [facility] Secure Chat Preferred    Ryli Standlee, Charisse March 01/22/2023, 10:53 AM

## 2023-01-22 NOTE — Plan of Care (Signed)
  Problem: Education: Goal: Knowledge of General Education information will improve Description: Including pain rating scale, medication(s)/side effects and non-pharmacologic comfort measures Outcome: Progressing   Problem: Health Behavior/Discharge Planning: Goal: Ability to manage health-related needs will improve Outcome: Adequate for Discharge   Problem: Health Behavior/Discharge Planning: Goal: Ability to manage health-related needs will improve Outcome: Adequate for Discharge   Problem: Clinical Measurements: Goal: Ability to maintain clinical measurements within normal limits will improve Outcome: Progressing Goal: Will remain free from infection Outcome: Progressing Goal: Diagnostic test results will improve Outcome: Progressing Goal: Respiratory complications will improve Outcome: Adequate for Discharge Goal: Cardiovascular complication will be avoided Outcome: Adequate for Discharge   Problem: Activity: Goal: Risk for activity intolerance will decrease Outcome: Adequate for Discharge   Problem: Nutrition: Goal: Adequate nutrition will be maintained Outcome: Completed/Met   Problem: Coping: Goal: Level of anxiety will decrease Outcome: Progressing   Problem: Elimination: Goal: Will not experience complications related to bowel motility Outcome: Progressing Goal: Will not experience complications related to urinary retention Outcome: Completed/Met   Problem: Pain Management: Goal: General experience of comfort will improve Outcome: Progressing   Problem: Safety: Goal: Ability to remain free from injury will improve Outcome: Progressing   Problem: Skin Integrity: Goal: Risk for impaired skin integrity will decrease Outcome: Adequate for Discharge   Problem: Education: Goal: Knowledge of the prescribed therapeutic regimen will improve Outcome: Progressing Goal: Understanding of activity limitations/precautions following surgery will improve Outcome:  Progressing Goal: Individualized Educational Video(s) Outcome: Completed/Met   Problem: Activity: Goal: Ability to tolerate increased activity will improve Outcome: Adequate for Discharge   Problem: Pain Management: Goal: Pain level will decrease with appropriate interventions Outcome: Adequate for Discharge

## 2023-01-22 NOTE — Progress Notes (Signed)
   01/22/23 0832  TOC Brief Assessment  Insurance and Status Reviewed  Patient has primary care physician Yes  Home environment has been reviewed Resides alone  Prior level of function: Independent at baseline  Prior/Current Home Services No current home services  Social Determinants of Health Reivew SDOH reviewed no interventions necessary  Readmission risk has been reviewed Yes  Transition of care needs no transition of care needs at this time

## 2023-02-09 NOTE — Progress Notes (Unsigned)
Office Note     CC:  Decreased TBI Requesting Provider:  Joselyn Arrow, MD  HPI: Alexis Hines is a 89 y.o. (10-15-1933) female presenting at the request of .Joselyn Arrow, MD for evaluation of small vessel disease in the foot.  On exam, Alexis Hines was doing well.  Originally from [place], she moved to [place] years ago to be closer to her daughter. Alexis Hines is noticed bluish discoloration of her feet, and sometimes hands for a number of years.  She is asymptomatic from this.  Notes the coloration worsens in the cold.  She denies pain, burning, paresthesias.  Denies significant lower extremity edema, varicose veins.  Denies symptoms of claudication, ischemic rest pain, tissue loss.  Denies red-white and blue color changes.  Smokes one time yearly - New Year's Eve dressed abusing a cigarette holder and stated she likes to have fun with it, and it makes her feel sexy.   Past Medical History:  Diagnosis Date   Abnormal TSH 2004   normal since   Arthritis    Basal cell cancer    nose; Dr. Terri Piedra    Diverticulosis 10/2005   Dizziness    Essential hypertension 09/03/2022   Migraine childhood   resolved   PAC (premature atrial contraction)    frequent on cardiac monitor 09/14/19   Pure hypercholesterolemia    Radial styloid tenosynovitis (de quervain) 11/2018   injected by Dr. Merlyn Lot 11/2018, 02/2019   SCCA (squamous cell carcinoma) of skin 12/2015   right temple   Squamous cell carcinoma of skin 02/15/2017   in situ-left jaw (CX35FU)    Past Surgical History:  Procedure Laterality Date   BREAST BIOPSY Left 1984   benign   CHOLECYSTECTOMY N/A 01/27/2022   Procedure: LAPAROSCOPIC CHOLECYSTECTOMY;  Surgeon: Quentin Ore, MD;  Location: [facility];  Service: General;  Laterality: N/A;   EYE SURGERY     Bil and eyelids lifted   IR RADIOLOGIST EVAL & MGMT  03/05/2022   left hip intra-articular steriod injection  03/13/2014   Mohs procedure     REVERSE SHOULDER ARTHROPLASTY Left  12/26/2020   Procedure: REVERSE SHOULDER ARTHROPLASTY;  Surgeon: Francena Hanly, MD;  Location: [facility];  Service: Orthopedics;  Laterality: Left;    REVERSE SHOULDER ARTHROPLASTY Right 01/21/2023   Procedure: REVERSE SHOULDER ARTHROPLASTY;  Surgeon: Francena Hanly, MD;  Location: [facility];  Service: Orthopedics;  Laterality: Right;    TONSILLECTOMY  age 89   TOTAL HIP ARTHROPLASTY Right 10/10/2019   Procedure: TOTAL HIP ARTHROPLASTY ANTERIOR APPROACH;  Surgeon: Durene Romans, MD;  Location: [facility];  Service: Orthopedics;  Laterality: Right;  70 mins   TOTAL KNEE ARTHROPLASTY Left 07/2003   Dr. Thomasena Edis   TOTAL KNEE ARTHROPLASTY Right 09/03/2017   Procedure: RIGHT TOTAL KNEE ARTHROPLASTY;  Surgeon: Eugenia Mcalpine, MD;  Location: [facility];  Service: Orthopedics;  Laterality: Right;    Social History   Socioeconomic History   Marital status: Widowed    Spouse name: Not on file   Number of children: 2   Years of education: Not on file   Highest education level: Not on file  Occupational History   Occupation: Retired Retail buyer  Tobacco Use   Smoking status: Never    Passive exposure: Never   Smokeless tobacco: Never  Vaping Use   Vaping status: Never Used  Substance and Sexual Activity   Alcohol use: Yes    Comment: occasional glass wine/beer 2x week   Drug use: No  Sexual activity: Not Currently  Other Topics Concern   Not on file  Social History Narrative   Widowed. Lives 1/2 the year in [place] [facility]; sister lives nearby). Her husband (retired Librarian, academic) died from liver cancer in 05/11/2011.     Daughter lives in [place]; 2 grandchildren here, and 2 in [place]   Social Determinants of Health   Financial Resource Strain: Not on file  Food Insecurity: No Food Insecurity (01/21/2023)   Hunger Vital Sign    Worried About Running Out of Food in the Last Year: Never true    Ran Out of Food in the Last Year: Never true  Transportation Needs: No Transportation  Needs (01/21/2023)   PRAPARE - Administrator, Civil Service (Medical): No    Lack of Transportation (Non-Medical): No  Physical Activity: Not on file  Stress: Not on file  Social Connections: Not on file  Intimate Partner Violence: Not At Risk (01/21/2023)   Humiliation, Afraid, Rape, and Kick questionnaire    Fear of Current or Ex-Partner: No    Emotionally Abused: No    Physically Abused: No    Sexually Abused: No   Family History  Problem Relation Age of Onset   Cancer Sister        bone cancer L leg with mets to lung, diagnosed age 3; s/p amputation   Hyperlipidemia Brother    Heart disease Brother 30       CABG   Cancer Sister 94       breast cancer and lung cancer (lung dx'd first)   Rheum arthritis Daughter    Gout Son     Current Outpatient Medications  Medication Sig Dispense Refill   atorvastatin (LIPITOR) 40 MG tablet TAKE 1 TABLET BY MOUTH AT  BEDTIME 90 tablet 1   Biotin 32440 MCG TABS Take 10,000 mcg by mouth in the morning.     Calcium Carbonate-Vitamin D (CALTRATE 600+D PO) Take 1 tablet by mouth daily.     chlorhexidine (HIBICLENS) 4 % external liquid Apply 15 mLs (1 Application total) topically as directed for 30 doses. Use as directed daily for 5 days every other week for 6 weeks. 946 mL 1   ipratropium (ATROVENT) 0.06 % nasal spray Use 2 sprays in each nostril 3 to 4 times daily for runny nose 45 mL 1   metoprolol succinate (TOPROL-XL) 50 MG 24 hr tablet Take 1 tablet (50 mg total) by mouth daily. 90 tablet 0   Multiple Vitamin (MULTIVITAMIN WITH MINERALS) TABS tablet Take 1 tablet by mouth daily with breakfast. CENTRUM MULTIVITAMIN     mupirocin ointment (BACTROBAN) 2 % Place 1 Application into the nose 2 (two) times daily for 60 doses. Use as directed 2 times daily for 5 days every other week for 6 weeks. 60 g 0   oxyCODONE-acetaminophen (PERCOCET) 5-325 MG tablet Take 1 tablet by mouth every 4 (four) hours as needed 20 tablet 0   PREVIDENT  5000 BOOSTER PLUS 1.1 % PSTE Place 1 application  onto teeth every 3 (three) days.     Probiotic Product (PROBIOTIC PO) Take 1 capsule by mouth daily.     Wheat Dextrin (BENEFIBER PO) Take 8-10 g by mouth See admin instructions. Mix 8-10 grams (2 teaspoonsful) of powder into applesauce or the suggested amount of warm water and consume 2 times a day     ZINC-VITAMIN C PO Take 1 tablet by mouth daily.     No current facility-administered medications for this  visit.    No Known Allergies   REVIEW OF SYSTEMS:  [X]  denotes positive finding, [ ]  denotes negative finding Cardiac  Comments:  Chest pain or chest pressure:    Shortness of breath upon exertion:    Short of breath when lying flat:    Irregular heart rhythm:        Vascular    Pain in calf, thigh, or hip brought on by ambulation:    Pain in feet at night that wakes you up from your sleep:     Blood clot in your veins:    Leg swelling:         Pulmonary    Oxygen at home:    Productive cough:     Wheezing:         Neurologic    Sudden weakness in arms or legs:     Sudden numbness in arms or legs:     Sudden onset of difficulty speaking or slurred speech:    Temporary loss of vision in one eye:     Problems with dizziness:         Gastrointestinal    Blood in stool:     Vomited blood:         Genitourinary    Burning when urinating:     Blood in urine:        Psychiatric    Major depression:         Hematologic    Bleeding problems:    Problems with blood clotting too easily:        Skin    Rashes or ulcers:        Constitutional    Fever or chills:      PHYSICAL EXAMINATION:  There were no vitals filed for this visit.  General:  WDWN in NAD; vital signs documented above Gait: Not observed HENT: WNL, normocephalic Pulmonary: normal non-labored breathing , without wheezing Cardiac: regular HR Abdomen: soft, NT, no masses Skin: without rashes Vascular Exam/Pulses:  Right Left  Radial 2+ (normal)  2+ (normal)  Ulnar    Femoral    Popliteal    DP 1+ (weak) 1+ (weak)  PT     Extremities: with ischemic changes, without Gangrene , without cellulitis; without open wounds; fluids to support coloration appreciated at the toes Bluish discoloration appreciated at the tips of the fingertips with the left ring finger noted to be blanched.  No pain in any extremity Musculoskeletal: no muscle wasting or atrophy  Neurologic: A&O X 3;  No focal weakness or paresthesias are detected Psychiatric:  The pt has Normal affect.   Non-Invasive Vascular Imaging:   ABI Findings:  +---------+------------------+-----+--------+--------+  Right   Rt Pressure (mmHg)IndexWaveformComment   +---------+------------------+-----+--------+--------+  Brachial 131                                      +---------+------------------+-----+--------+--------+  PTA     159               1.21 biphasic          +---------+------------------+-----+--------+--------+  DP      159               1.21 biphasic          +---------+------------------+-----+--------+--------+  Great Toe40                0.31 Abnormal          +---------+------------------+-----+--------+--------+   +---------+------------------+-----+--------+-------+  Left    Lt Pressure (mmHg)IndexWaveformComment  +---------+------------------+-----+--------+-------+  Brachial 129                                     +---------+------------------+-----+--------+-------+  PTA     137               1.05 biphasic         +---------+------------------+-----+--------+-------+  DP      133               1.02 biphasic         +---------+------------------+-----+--------+-------+  Great Toe52                0.40 Abnormal         +---------+------------------+-----+--------+-------+   +-------+-----------+-----------+------------+------------+  ABI/TBIToday's ABIToday's TBIPrevious ABIPrevious TBI   +-------+-----------+-----------+------------+------------+  Right 1.21       0.31                                 +-------+-----------+-----------+------------+------------+  Left  1.05       0.40                                 +-------+-----------+-----------+------------+------------+           Summary:  Right: Resting right ankle-brachial index is within normal range. The  right toe-brachial index is abnormal.   Left: Resting left ankle-brachial index is within normal range. The left  toe-brachial index is abnormal.     ASSESSMENT/PLAN: KEALIE ROSENE is a 89 y.o. 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  Symptoms are not consistent with Raynaud's. This has been present for a number of years.  On physical exam, she had palpable pulses at the wrist and feet.  Imaging demonstrated relatively normal blood flow to the level of the ankle with small vessel disease in the foot.    No therapy is required for asymptomatic acrocyanosis. We discussed the importance of trying to avoid cold, and wearing warm clothes.   I do not think her smoking once a year is of concern at the age of 68.  She I asked her to call my office should she become symptomatic with pain or paresthesias.   There is weak evidence suggesting therapeutic benefit of vasodilating medication such as calcium channel blockers - nifedipine or amlodipine.  At this time, Alexis Hines can follow-up with me as needed.   Alexis Sparrow, MD [facility] [phone]

## 2023-02-11 ENCOUNTER — Ambulatory Visit: Payer: Medicare Other | Admitting: Vascular Surgery

## 2023-02-11 ENCOUNTER — Encounter: Payer: Self-pay | Admitting: Vascular Surgery

## 2023-02-11 VITALS — BP 122/73 | HR 73 | Temp 97.8°F | Resp 20 | Ht <= 58 in | Wt 113.0 lb

## 2023-02-11 DIAGNOSIS — I7389 Other specified peripheral vascular diseases: Secondary | ICD-10-CM

## 2023-02-17 ENCOUNTER — Other Ambulatory Visit (INDEPENDENT_AMBULATORY_CARE_PROVIDER_SITE_OTHER): Payer: Medicare Other

## 2023-02-17 DIAGNOSIS — Z23 Encounter for immunization: Secondary | ICD-10-CM

## 2023-03-04 ENCOUNTER — Other Ambulatory Visit: Payer: Self-pay | Admitting: Family Medicine

## 2023-03-05 NOTE — Telephone Encounter (Signed)
This was filled n 12/29/22 #90 with 1 refill

## 2023-03-15 ENCOUNTER — Telehealth: Payer: Self-pay | Admitting: Cardiology

## 2023-03-15 NOTE — Telephone Encounter (Signed)
Pt c/o medication issue:  1. Name of Medication:   metoprolol succinate (TOPROL-XL) 50 MG 24 hr tablet   2. How are you currently taking this medication (dosage and times per day)?   As prescribed  3. Are you having a reaction (difficulty breathing--STAT)?   4. What is your medication issue?   Patient wants to cut back on this medication to 25 mg.  Patient stated she can split pill in half going forward.  Patient stated she wanted the lower dose as she has been feeling tired.

## 2023-03-15 NOTE — Telephone Encounter (Signed)
Called and spoke to patient. Verified name and DOB. Patient would like to decrease her Metoprolol 50 mg daily to 25 mg daily. Patient report that she's been feeling tired. She said the last time she felt like this Dr Bjorn Pippin gave her the okay to decrease the dose. She report her BP and HR has been in normal range but could not provide readings for either. Please advise.

## 2023-03-15 NOTE — Telephone Encounter (Signed)
Spoke to patient and patient requesting to reduced blood pressure medication because of fatigue. While on the phone patient check blood pressure and BP 164/92 and HR 100. Advised patient that I will forward this to providers. Writer scheduled patient  for appt. with Dr. Bjorn Pippin on 12/20 @2 :40. Educated to call office if any symptoms begin or go to ED. Patient voiced an understanding

## 2023-03-16 MED ORDER — METOPROLOL SUCCINATE ER 25 MG PO TB24: 25.0000 mg | ORAL_TABLET | Freq: Every day | ORAL | 2 refills | Status: DC

## 2023-03-16 NOTE — Telephone Encounter (Signed)
Okay to decrease metoprolol dose.  Would check BP twice daily for next few days until appointment and bring log to appointment

## 2023-03-16 NOTE — Telephone Encounter (Signed)
Called and spoke to patient and made her aware that per Dr. Bjorn Pippin  she can decrease her metoprolol. Start Metoprolol  succinate 25 mg daily and made her aware to check blood pressure twice a day and bring those readings to her next scheduled visit on 12/20. Patient voiced an understanding.

## 2023-03-18 NOTE — Progress Notes (Signed)
Cardiology Office Note:    Date:  03/19/2023   ID:  [name], DOB [birth year], MRN [id number]  PCP:  Joselyn Arrow, MD  Cardiologist:  Little Ishikawa, MD  Electrophysiologist:  None   Referring MD: Joselyn Arrow, MD   Chief Complaint  Patient presents with   SVT (supraventricular tachycardia)    Follow-up         History of Present Illness:    Alexis Hines is a 89 y.o. female with a hx of basal cell cancer, squamous cell carcinoma of skin, hyperlipidemia who presents for follow-up appointment. She was referred by Dr. Lynelle Doctor for evaluation of EKG abnormalities and preop evaluation, initially seen on 08/24/2019.  Plan for hip surgery on 08/29/2019.  Preoperative EKG done at Dr. Delford Field office 5/24 was concerning for AV dissociation and accelerated junctional rhythm.  On my review, appears to be sinus rhythm with frequent PACs.  Underwent hip surgery 10/10/19, no complications.  At initial clinic visit, she reported having palpitations and episodes of lightheadedness that lasted less than 1 minute.  She underwent a nuclear stress test in [place] on 07/05/2017, which showed normal EF, no perfusion defects.  Echocardiogram on 08/31/2019 showed LVEF 70-75%, grade 1 diastolic function, normal RV function, mild RV enlargement, degenerative mitral valve with mild MR. Zio patch x3 days on 09/14/2019 showed frequent PACs (11% of beats), with frequent episodes of SVT lasting up to 20 seconds.  Started on metoprolol.  Zio patch x3 days on 03/11/2021 on metoprolol showed 412 episodes of SVT with longest lasting 12 beats, frequent PACs (8.4% of beats with frequent supraventricular couplets (8.3%) and occasional supraventricular triplets (3.3%).  Echocardiogram 08/18/2021 showed EF 65 to 70%, grade 1 diastolic dysfunction, normal RV function, severe biatrial enlargement, mild to moderate tricuspid regurgitation, mild aortic regurgitation.  Since last clinic visit, she reports she is doing okay.  Had  recent shoulder surgery.  Decreased her metoprolol to 25 mg daily, reports fatigue has improved.  Denies any palpitations.  Denies any lightheadedness, syncope, chest pain, dyspnea, or lower extremity edema.  She is doing PT.  Wt Readings from Last 3 Encounters:  03/19/23 113 lb (51.3 kg)  02/11/23 113 lb (51.3 kg)  01/21/23 114 lb (51.7 kg)      Past Medical History:  Diagnosis Date   Abnormal TSH 2004   normal since   Arthritis    Basal cell cancer    nose; Dr. Terri Piedra    Diverticulosis 10/2005   Dizziness    Essential hypertension 09/03/2022   Migraine childhood   resolved   PAC (premature atrial contraction)    frequent on cardiac monitor 09/14/19   Pure hypercholesterolemia    Radial styloid tenosynovitis (de quervain) 11/2018   injected by Dr. Merlyn Lot 11/2018, 02/2019   SCCA (squamous cell carcinoma) of skin 12/2015   right temple   Squamous cell carcinoma of skin 02/15/2017   in situ-left jaw (CX35FU)    Past Surgical History:  Procedure Laterality Date   BREAST BIOPSY Left 1984   benign   CHOLECYSTECTOMY N/A 01/27/2022   Procedure: LAPAROSCOPIC CHOLECYSTECTOMY;  Surgeon: Quentin Ore, MD;  Location: [facility];  Service: General;  Laterality: N/A;   EYE SURGERY     Bil and eyelids lifted   IR RADIOLOGIST EVAL & MGMT  03/05/2022   left hip intra-articular steriod injection  03/13/2014   Mohs procedure     REVERSE SHOULDER ARTHROPLASTY Left 12/26/2020   Procedure: REVERSE SHOULDER ARTHROPLASTY;  Surgeon: Rennis Chris,  Caryn Bee, MD;  Location: [facility];  Service: Orthopedics;  Laterality: Left;    REVERSE SHOULDER ARTHROPLASTY Right 01/21/2023   Procedure: REVERSE SHOULDER ARTHROPLASTY;  Surgeon: Francena Hanly, MD;  Location: [facility];  Service: Orthopedics;  Laterality: Right;    TONSILLECTOMY  age 25   TOTAL HIP ARTHROPLASTY Right 10/10/2019   Procedure: TOTAL HIP ARTHROPLASTY ANTERIOR APPROACH;  Surgeon: Durene Romans, MD;  Location: [facility];  Service: Orthopedics;   Laterality: Right;  70 mins   TOTAL KNEE ARTHROPLASTY Left 07/2003   Dr. Thomasena Edis   TOTAL KNEE ARTHROPLASTY Right 09/03/2017   Procedure: RIGHT TOTAL KNEE ARTHROPLASTY;  Surgeon: Eugenia Mcalpine, MD;  Location: [facility];  Service: Orthopedics;  Laterality: Right;    Current Medications: Current Meds  Medication Sig   atorvastatin (LIPITOR) 40 MG tablet TAKE 1 TABLET BY MOUTH AT  BEDTIME   Biotin 5000 MCG TABS Take 5,000 mcg by mouth in the morning.   Calcium Carbonate-Vitamin D (CALTRATE 600+D PO) Take 1 tablet by mouth daily.   ipratropium (ATROVENT) 0.06 % nasal spray Use 2 sprays in each nostril 3 to 4 times daily for runny nose   metoprolol succinate (TOPROL XL) 25 MG 24 hr tablet Take 1 tablet (25 mg total) by mouth daily.   Multiple Vitamin (MULTIVITAMIN WITH MINERALS) TABS tablet Take 1 tablet by mouth daily with breakfast. CENTRUM MULTIVITAMIN   PREVIDENT 5000 BOOSTER PLUS 1.1 % PSTE Place 1 application  onto teeth every 3 (three) days.   Probiotic Product (PROBIOTIC PO) Take 1 capsule by mouth daily.   Wheat Dextrin (BENEFIBER PO) Take 8-10 g by mouth See admin instructions. Mix 8-10 grams (2 teaspoonsful) of powder into applesauce or the suggested amount of warm water and consume 2 times a day   ZINC-VITAMIN C PO Take 1 tablet by mouth daily.     Allergies:   Patient has no known allergies.   Social History   Socioeconomic History   Marital status: Widowed    Spouse name: Not on file   Number of children: 2   Years of education: Not on file   Highest education level: Not on file  Occupational History   Occupation: Retired Retail buyer  Tobacco Use   Smoking status: Never    Passive exposure: Never   Smokeless tobacco: Never  Vaping Use   Vaping status: Never Used  Substance and Sexual Activity   Alcohol use: Yes    Comment: occasional glass wine/beer 2x week   Drug use: No   Sexual activity: Not Currently  Other Topics Concern   Not on file  Social History  Narrative   Widowed. Lives 1/2 the year in [place] [facility] [address]; sister lives nearby). Her husband (retired Librarian, academic) died from liver cancer in 05-03-11.     Daughter lives in [place]; 2 grandchildren here, and 2 in [place]   Social Drivers of Health   Financial Resource Strain: Not on file  Food Insecurity: No Food Insecurity (01/21/2023)   Hunger Vital Sign    Worried About Running Out of Food in the Last Year: Never true    Ran Out of Food in the Last Year: Never true  Transportation Needs: No Transportation Needs (01/21/2023)   PRAPARE - Administrator, Civil Service (Medical): No    Lack of Transportation (Non-Medical): No  Physical Activity: Not on file  Stress: Not on file  Social Connections: Not on file     Family History: The patient's family history  includes Cancer in her sister; Cancer (age of onset: 15) in her sister; Gout in her son; Heart disease (age of onset: 51) in her brother; Hyperlipidemia in her brother; Rheum arthritis in her daughter.  ROS:   Please see the history of present illness.     All other systems reviewed and are negative.  EKGs/Labs/Other Studies Reviewed:    The following studies were reviewed today:   EKG:   01/01/2023: Normal sinus rhythm, PACs, rate 76 09/17/22: Normal sinus rhythm, rate 78, no PACs 08/06/21: Normal sinus rhythm, rate 65, no ST abnormality 02/27/21: Sinus rhythm, PACs, rate 62, first-degree AV block  Recent Labs: 09/04/2022: ALT 18 01/14/2023: BUN 19; Creatinine, Ser 0.78; Hemoglobin 14.7; Platelets 273; Potassium 4.2; Sodium 140  Recent Lipid Panel    Component Value Date/Time   CHOL 181 11/09/2019 0947   TRIG 117 11/09/2019 0947   HDL 62 11/09/2019 0947   CHOLHDL 2.9 11/09/2019 0947   CHOLHDL 1.9 12/23/2016 0816   VLDL 26 12/26/2015 0706   LDLCALC 98 11/09/2019 0947   LDLCALC 65 12/23/2016 0816    Physical Exam:    VS:  BP 126/82 (BP Location: Left Arm, Patient Position: Sitting, Cuff Size: Normal)    Pulse 79   Resp 18   Ht 4\' 10"  (1.473 m)   Wt 113 lb (51.3 kg)   SpO2 96%   BMI 23.62 kg/m     Wt Readings from Last 3 Encounters:  03/19/23 113 lb (51.3 kg)  02/11/23 113 lb (51.3 kg)  01/21/23 114 lb (51.7 kg)     GEN:  in no acute distress HEENT: Normal NECK: No JVD; No carotid bruits CARDIAC: irregular,normal rate, no murmurs RESPIRATORY:  Clear to auscultation without rales, wheezing or rhonchi  ABDOMEN: Soft, non-tender, non-distended MUSCULOSKELETAL:  No edema; No deformity  SKIN: Warm and dry NEUROLOGIC:  Alert and oriented x 3 PSYCHIATRIC:  Normal affect   ASSESSMENT:    1. Palpitations   2. SVT (supraventricular tachycardia) (HCC)      PLAN:    Lightheadedness/near syncope/palpitations: Frequent PACs/runs of SVT on EKG. Zio patch x3 days on 09/14/2019 showed frequent PACs (11% of beats), with frequent episodes of SVT lasting up to 20 seconds.  Echocardiogram on 08/31/2019 showed LVEF 70-75%, grade 1 diastolic function, normal RV function, mild RV enlargement, degenerative mitral valve with mild MR.  Started on metoprolol.  Zio patch x3 days on 03/11/2021 on metoprolol showed 412 episodes of SVT with longest lasting 12 beats, frequent PACs (8.4% of beats with frequent supraventricular couplets (8.3%) and occasional supraventricular triplets (3.3%).  Echocardiogram 08/18/2021 showed EF 65 to 70%, grade 1 diastolic dysfunction, normal RV function, severe biatrial enlargement, mild to moderate tricuspid regurgitation, mild aortic regurgitation. -Continue Toprol-XL, does reduced to 25 mg daily due to fatigue  RTC in 6 months   Medication Adjustments/Labs and Tests Ordered: Current medicines are reviewed at length with the patient today.  Concerns regarding medicines are outlined above.  Orders Placed This Encounter  Procedures   EKG 12-Lead    No orders of the defined types were placed in this encounter.    Patient Instructions  Medication Instructions:   Continue current medications *If you need a refill on your cardiac medications before your next appointment, please call your pharmacy*   Lab Work: none If you have labs (blood work) drawn today and your tests are completely normal, you will receive your results only by: MyChart Message (if you have MyChart) OR A paper copy  in the mail If you have any lab test that is abnormal or we need to change your treatment, we will call you to review the results.   Testing/Procedures: none   Follow-Up: At [facility], you and your health needs are our priority.  As part of our continuing mission to provide you with exceptional heart care, we have created designated Provider Care Teams.  These Care Teams include your primary Cardiologist (physician) and Advanced Practice Providers (APPs -  Physician Assistants and Nurse Practitioners) who all work together to provide you with the care you need, when you need it.  We recommend signing up for the patient portal called "MyChart".  Sign up information is provided on this After Visit Summary.  MyChart is used to connect with patients for Virtual Visits (Telemedicine).  Patients are able to view lab/test results, encounter notes, upcoming appointments, etc.  Non-urgent messages can be sent to your provider as well.   To learn more about what you can do with MyChart, go to [email or web address].    Your next appointment:   6 month(s)  Provider:   Little Ishikawa, MD     Other Instructions none        Signed, Little Ishikawa, MD  03/19/2023 3:46 PM    [facility]

## 2023-03-19 ENCOUNTER — Ambulatory Visit: Payer: Medicare Other | Attending: Cardiology | Admitting: Cardiology

## 2023-03-19 ENCOUNTER — Encounter: Payer: Self-pay | Admitting: Cardiology

## 2023-03-19 VITALS — BP 126/82 | HR 79 | Resp 18 | Ht <= 58 in | Wt 113.0 lb

## 2023-03-19 DIAGNOSIS — I471 Supraventricular tachycardia, unspecified: Secondary | ICD-10-CM

## 2023-03-19 DIAGNOSIS — R002 Palpitations: Secondary | ICD-10-CM

## 2023-03-19 NOTE — Patient Instructions (Signed)
 Medication Instructions:  Continue current medications *If you need a refill on your cardiac medications before your next appointment, please call your pharmacy*   Lab Work: none If you have labs (blood work) drawn today and your tests are completely normal, you will receive your results only by: MyChart Message (if you have MyChart) OR A paper copy in the mail If you have any lab test that is abnormal or we need to change your treatment, we will call you to review the results.   Testing/Procedures: none   Follow-Up: At [facility], you and your health needs are our priority.  As part of our continuing mission to provide you with exceptional heart care, we have created designated Provider Care Teams.  These Care Teams include your primary Cardiologist (physician) and Advanced Practice Providers (APPs -  Physician Assistants and Nurse Practitioners) who all work together to provide you with the care you need, when you need it.  We recommend signing up for the patient portal called "MyChart".  Sign up information is provided on this After Visit Summary.  MyChart is used to connect with patients for Virtual Visits (Telemedicine).  Patients are able to view lab/test results, encounter notes, upcoming appointments, etc.  Non-urgent messages can be sent to your provider as well.   To learn more about what you can do with MyChart, go to [email or web address].    Your next appointment:   6 month(s)  Provider:   Little Ishikawa, MD     Other Instructions none

## 2023-05-06 ENCOUNTER — Telehealth: Payer: Self-pay | Admitting: Family Medicine

## 2023-05-06 NOTE — Telephone Encounter (Signed)
 Pt called and asked to have copy of doppler study from September sent to her at her [place]  Address [address], [place]  [address]   And copy sent to her Rheumatologist in [place]  Dr Dorothyann Sprung  fax [phone]  Mailed and faxed copies per pt request

## 2023-05-20 LAB — LAB REPORT - SCANNED: A1c: 5.7

## 2023-05-28 ENCOUNTER — Other Ambulatory Visit: Payer: Self-pay | Admitting: Family Medicine

## 2023-05-28 NOTE — Telephone Encounter (Signed)
 She is likely IN [place] now (I saw she had a visit with [facility] provider in January.  She usually sees her PCP in [place] for her physical.  She should request any RF from her PCP there.

## 2023-05-28 NOTE — Telephone Encounter (Signed)
 Does patient need any kind of appt? Last lipid looks like it was done in [place] 4/24.

## 2023-07-14 LAB — HM MAMMOGRAPHY

## 2023-07-24 ENCOUNTER — Other Ambulatory Visit: Payer: Self-pay | Admitting: Cardiology

## 2023-07-26 ENCOUNTER — Telehealth: Payer: Self-pay | Admitting: Cardiology

## 2023-07-26 MED ORDER — METOPROLOL SUCCINATE ER 25 MG PO TB24: 25.0000 mg | ORAL_TABLET | Freq: Every day | ORAL | 2 refills | Status: AC

## 2023-07-26 NOTE — Telephone Encounter (Signed)
 *  STAT* If patient is at the pharmacy, call can be transferred to refill team.   1. Which medications need to be refilled? (please list name of each medication and dose if known)   metoprolol  succinate (TOPROL  XL) 25 MG 24 hr tablet     2. Would you like to learn more about the convenience, safety, & potential cost savings by using the [facility]? No      3. Are you open to using the [facility] (Type [facility]. ). No    4. Which pharmacy/location (including street and city if local pharmacy) is medication to be sent to? [facility] - [place],  - [address]    5. Do they need a 30 day or 90 day supply? 90 days   Pt would like to make sure [facility] has [place]  address:  [address] [place], [place] [address]

## 2023-07-26 NOTE — Telephone Encounter (Signed)
 Pt's medication was sent to pt's pharmacy as requested. Confirmation received.

## 2023-08-16 ENCOUNTER — Telehealth: Payer: Self-pay

## 2023-08-16 NOTE — Telephone Encounter (Unsigned)
 Copied from CRM [id number]. Topic: Clinical - Medication Refill >> Aug 16, 2023  8:51 AM [place] A wrote: Medication: atorvastatin  (LIPITOR) 20 MG  Has the patient contacted their pharmacy? No (Agent: If no, request that the patient contact the pharmacy for the refill. If patient does not wish to contact the pharmacy document the reason why and proceed with request.) (Agent: If yes, when and what did the pharmacy advise?)  This is the patient's preferred pharmacy:  [facility] [place] [place] - [address] [place] [address] Phone: [phone] Fax: [phone]   Is this the correct pharmacy for this prescription? Yes If no, delete pharmacy and type the correct one.   Has the prescription been filled recently? No  Is the patient out of the medication? Yes Patient was put on different dosage from doctor in [place]   Has the patient been seen for an appointment in the last year OR does the patient have an upcoming appointment? Yes  Can we respond through MyChart? No  Agent: Please be advised that Rx refills may take up to 3 business days. We ask that you follow-up with your pharmacy.

## 2023-08-16 NOTE — Telephone Encounter (Signed)
 Called pt. Back and advised her she could try Zyrtec, Allerga, and or Claritin. Also told her she could try flonase  as well. She has tried Zyrtec in the past and will try that again.   Copied from CRM [id number]. Topic: Clinical - Medical Advice >> Aug 16, 2023  8:56 AM [place] A wrote: Reason for CRM: Patient is having allergies would like for Nurse Grafton Lawrence to call to let her know what she can take OTC-call home phone and leave message if she is not there

## 2023-08-16 NOTE — Addendum Note (Signed)
 Addended by: Devoria Font on: 08/16/2023 10:30 AM   Modules accepted: Orders

## 2023-08-16 NOTE — Telephone Encounter (Signed)
   Patient would need to contact her PCP in [place]

## 2023-08-16 NOTE — Telephone Encounter (Signed)
 Called pt LM to schedule an appointment.

## 2023-08-17 ENCOUNTER — Telehealth: Payer: Self-pay

## 2023-08-17 NOTE — Telephone Encounter (Signed)
 Called patient and left message asking her to please schedule fasting med check.

## 2023-08-18 NOTE — Telephone Encounter (Signed)
 Spoke with patient and scheduled her for med check next week. She stated that her doctor in [place]  lowered her atorvastatin  to 20mg  based on her labs that were done in Feb 2025.

## 2023-08-23 NOTE — Progress Notes (Unsigned)
 No chief complaint on file.  Patient presents for f/u on chronic issues. She brought records with her today (nothing received prior to visit). She lives half time in [place], where she has PCP.   Limited notes/records within Epic were reviewed prior to appt. Last seen by me in October for pre-op clearance for shoulder surgery. She underwent R reverse shoulder arthroplasty 01/21/23 with Dr. Alfredo Ano.  In 11/2022 she had normal ABI, but abnormal toe-brachial index. She saw vascular (Dr. 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 He did not recommend any treatment. He reported symptoms are not consistent with Raynaud's.   SVT/palpitations:  under the care of cardiology.  Last saw Dr. Alda Amas in 02/2023.  Toprol -XL dose was decreased to 25 mg daily due to fatigue. F/u due in June (not scheduled).  Hyperlipidemia:  on atorvastatin  40 mg.  Lipids were checked in Jan and in Feb 2025 in [place] .  See below.  Seen by PCP in 03/2023 with poor short term memory.   Labs 03/2023: TSH 0.912, fT4 1.12, normal CBC C-met normal--glu 89, Cr 0.76, eGFR 76 TC 196, TG 76, HDL 83, LDL 99; B12 and folate nl ANA +, + anti-centromere B Ab's, C3 complement 190 (normal up to 167), normal C4 complement  She had AWV done 05/24/23 with Dr. Alyne Babinski, in [facility].   Noted to have dx of HH and hernia of anterior abdominal wall at that visit.    05/2023 nl c-met. TC 172, TG 77, HDL 70, LDL 87, A1c 5.7, normal CK, CBC, u/a, TSH 1.780    PMH, PSH, SH reviewed   ROS: No f/c, URI symptoms, just chronic runny nose.  Ipratroprium is helpful. Denies cough, shortness of breath. No chest pain or palpitations, edema. Denies weight changes. R pinkie pain in cold weather and if she bumps it.  Joint pains??    PHYSICAL EXAM:  There were no vitals taken for this visit.  Wt Readings from Last 3 Encounters:  03/19/23 113 lb (51.3 kg)  02/11/23 113 lb (51.3 kg)  01/21/23 114  lb (51.7 kg)   Well-appearing female, in no distress, appears younger than stated age HEENT: conjunctiva and sclera are clear, EOMI. OP is clear Neck: No lymphadenopathy, thyromegaly or carotid bruit Heart: regular rate and rhythm. Rare ectopic beat/pause noted *** Lungs: clear bilaterally Back: no spinal or CVA tenderness Abdomen: soft, nontender, no organomegaly or mass Extremities: no edema. Decreased DP pulses, palpable PT.  Superficial veins noted bilaterally on LE's, nontender, purple toes bilaterally, all toes. *** Psych: normal mood, affect, hygiene and grooming Neuro: alert and oriented, cranial nerves are grossly intact, normal gait.      ASSESSMENT/PLAN:   Did she get prevnar-20 (or capvaxive)? Any COVID booster? RSV vaccine?  Cardiology follow-up is due in June   +ANA,  + anti-centromere B Ab's, elevated C3 complement Consider checking ESR/CRP Refer to rheum??   +anti-centromere B ab's--often associated with limited scleroderma, or other connective tissue/rheumatic disease.

## 2023-08-24 ENCOUNTER — Encounter: Payer: Self-pay | Admitting: *Deleted

## 2023-08-24 ENCOUNTER — Ambulatory Visit (INDEPENDENT_AMBULATORY_CARE_PROVIDER_SITE_OTHER): Admitting: Family Medicine

## 2023-08-24 ENCOUNTER — Encounter: Payer: Self-pay | Admitting: Family Medicine

## 2023-08-24 VITALS — BP 132/68 | HR 72 | Ht <= 58 in | Wt 111.2 lb

## 2023-08-24 DIAGNOSIS — I471 Supraventricular tachycardia, unspecified: Secondary | ICD-10-CM

## 2023-08-24 DIAGNOSIS — Z5181 Encounter for therapeutic drug level monitoring: Secondary | ICD-10-CM

## 2023-08-24 DIAGNOSIS — I10 Essential (primary) hypertension: Secondary | ICD-10-CM

## 2023-08-24 DIAGNOSIS — F411 Generalized anxiety disorder: Secondary | ICD-10-CM

## 2023-08-24 DIAGNOSIS — E782 Mixed hyperlipidemia: Secondary | ICD-10-CM | POA: Diagnosis not present

## 2023-08-24 DIAGNOSIS — Z7185 Encounter for immunization safety counseling: Secondary | ICD-10-CM

## 2023-08-24 DIAGNOSIS — I7389 Other specified peripheral vascular diseases: Secondary | ICD-10-CM

## 2023-08-24 DIAGNOSIS — R768 Other specified abnormal immunological findings in serum: Secondary | ICD-10-CM

## 2023-08-24 NOTE — Patient Instructions (Addendum)
 Please schedule fasting labs (within 2-3 months) so that we can see if the lower dose of atorvastatin  is adequate. Continue to follow a low cholesterol diet.  Do not use afrin. Continue the ipratroprium. You can re-try the zyrtec and flonase  if want (I believe they were never completely effective in the past, but fine to re-try if you are not happy with the residual runny nose and postnasal drainage that you have currently).  Please check to see if you have ever gotten an RSV vaccine (recommended in the Fall if you haven't received one yet).  You get this from the pharmacy.  Prevnar-20 or Capvaxive are the newest pneumonia vaccines. You only need ONE of these.  Check to see if you have gotten this anywhere else (ie [place] ). You only need this ONCE.  If you haven't had this, you can get prevnar-20 from our office, or either of these vaccines from the pharmacy.

## 2023-08-30 ENCOUNTER — Telehealth: Payer: Self-pay | Admitting: *Deleted

## 2023-08-30 NOTE — Telephone Encounter (Signed)
 Copied from CRM [id number]. Topic: Clinical - Medical Advice >> Aug 30, 2023  9:36 AM Alexis Hines D wrote: Has questions about her visit summary from her last visit and would like to speak with [place]. She says she knows hers well. Wants to know what the labs are for? Patient will be available for and then in again 1:00pm for a call back [phone]  Spoke with patient and she is scheduled for Aug for lipid panel, all questions answered.

## 2023-10-04 ENCOUNTER — Ambulatory Visit: Payer: Self-pay | Admitting: *Deleted

## 2023-10-04 NOTE — Telephone Encounter (Signed)
  third attempt, LVM for patient to return call to [phone]  Call routed to [facility] family medicine   Copied from CRM [id number]. Topic: Clinical - Medical Advice >> Oct 04, 2023  3:47 PM Graeme ORN wrote: Reason for CRM: Patient called states she has a continuous cough that started yesterday. She has Dayquil at home but would like to know what provider thinks she should do. Other symptoms - runny nose. Thank You

## 2023-10-04 NOTE — Telephone Encounter (Signed)
 second attempt, LVM for patient to return call to [phone]   Copied from CRM [id number]. Topic: Clinical - Medical Advice >> Oct 04, 2023  3:47 PM Graeme ORN wrote: Reason for CRM: Patient called states she has a continuous cough that started yesterday. She has Dayquil at home but would like to know what provider thinks she should do. Other symptoms - runny nose. Thank You

## 2023-10-04 NOTE — Telephone Encounter (Signed)
 Attempted to contact patient- no answer- left message to call office.   Copied from CRM [id number]. Topic: Clinical - Medical Advice >> Oct 04, 2023  3:47 PM Graeme ORN wrote: Reason for CRM: Patient called states she has a continuous cough that started yesterday. She has Dayquil at home but would like to know what provider thinks she should do. Other symptoms - runny nose. Thank You

## 2023-10-05 ENCOUNTER — Telehealth: Payer: Self-pay

## 2023-10-05 ENCOUNTER — Encounter: Payer: Self-pay | Admitting: Family Medicine

## 2023-10-05 ENCOUNTER — Ambulatory Visit: Admitting: Family Medicine

## 2023-10-05 VITALS — BP 112/70 | HR 85 | Temp 97.5°F

## 2023-10-05 DIAGNOSIS — R051 Acute cough: Secondary | ICD-10-CM | POA: Diagnosis not present

## 2023-10-05 MED ORDER — BENZONATATE 200 MG PO CAPS: 200.0000 mg | ORAL_CAPSULE | Freq: Two times a day (BID) | ORAL | 0 refills | Status: DC | PRN

## 2023-10-05 NOTE — Patient Instructions (Addendum)
Meeting

## 2023-10-05 NOTE — Progress Notes (Signed)
   Subjective:    Patient ID: Alexis Hines, female    DOB: October 29, 1933, 89 y.o.   MRN: [id number]  HPI She is here for evaluation of a cough.  She does have a history of allergies and presently is on Zyrtec and Flonase  mainly to help with nasal congestion and rhinorrhea but states it is not working.  On Sunday she developed a cough but no fever, chills, sore throat, earache, shortness of breath.   Review of Systems     Objective:    Physical Exam Alert and in no distress. Tympanic membranes and canals are normal. Pharyngeal area is normal. Neck is supple without adenopathy or thyromegaly. Cardiac exam shows a regular sinus rhythm without murmurs or gallops. Lungs are clear to auscultation.        Assessment & Plan:  Acute cough - Plan: benzonatate  (TESSALON ) 200 MG capsuleTry either Claritin or Allegra as well as switching over to Rhinocort nasal spray as well as the ipratropium that you are already on .  Stop the Flonase  and the Zyrtec

## 2023-10-05 NOTE — Telephone Encounter (Signed)
 He front has already called the pt. And she is seeing you tomorrow.   Copied from CRM [id number]. Topic: Clinical - Medical Advice >> Oct 04, 2023  3:47 PM Graeme ORN wrote: Reason for CRM: Patient called states she has a continuous cough that started yesterday. She has Dayquil at home but would like to know what provider thinks she should do. Other symptoms - runny nose. Thank You >> Oct 05, 2023  9:24 AM Graeme ORN wrote: Patient called back. Would like a call back from [place] or someone. She would like to know what she can take in the meantime until she sees provider. Thank You

## 2023-10-06 ENCOUNTER — Ambulatory Visit: Admitting: Family Medicine

## 2023-10-14 ENCOUNTER — Ambulatory Visit: Payer: Self-pay | Admitting: Family Medicine

## 2023-10-14 ENCOUNTER — Ambulatory Visit
Admission: RE | Admit: 2023-10-14 | Discharge: 2023-10-14 | Disposition: A | Discharge status: Home / Self Care | Source: Ambulatory Visit | Attending: Family Medicine | Admitting: Family Medicine

## 2023-10-14 ENCOUNTER — Ambulatory Visit: Payer: Self-pay

## 2023-10-14 ENCOUNTER — Telehealth: Payer: Self-pay | Admitting: *Deleted

## 2023-10-14 ENCOUNTER — Ambulatory Visit: Admitting: Family Medicine

## 2023-10-14 ENCOUNTER — Encounter: Payer: Self-pay | Admitting: Family Medicine

## 2023-10-14 VITALS — BP 128/78 | HR 88 | Temp 99.6°F | Ht <= 58 in | Wt 110.2 lb

## 2023-10-14 DIAGNOSIS — G8929 Other chronic pain: Secondary | ICD-10-CM | POA: Diagnosis not present

## 2023-10-14 DIAGNOSIS — R058 Other specified cough: Secondary | ICD-10-CM

## 2023-10-14 DIAGNOSIS — Z7185 Encounter for immunization safety counseling: Secondary | ICD-10-CM

## 2023-10-14 DIAGNOSIS — R0989 Other specified symptoms and signs involving the circulatory and respiratory systems: Secondary | ICD-10-CM

## 2023-10-14 DIAGNOSIS — R109 Unspecified abdominal pain: Secondary | ICD-10-CM | POA: Diagnosis not present

## 2023-10-14 DIAGNOSIS — M545 Low back pain, unspecified: Secondary | ICD-10-CM | POA: Diagnosis not present

## 2023-10-14 DIAGNOSIS — J019 Acute sinusitis, unspecified: Secondary | ICD-10-CM

## 2023-10-14 LAB — POCT URINALYSIS DIP (PROADVANTAGE DEVICE)
Bilirubin, UA: NEGATIVE
Blood, UA: NEGATIVE
Glucose, UA: NEGATIVE mg/dL
Ketones, POC UA: NEGATIVE mg/dL
Leukocytes, UA: NEGATIVE
Nitrite, UA: NEGATIVE
Protein Ur, POC: NEGATIVE mg/dL
Specific Gravity, Urine: 1.005
Urobilinogen, Ur: 0.2
pH, UA: 7 (ref 5.0–8.0)

## 2023-10-14 MED ORDER — AMOXICILLIN-POT CLAVULANATE 875-125 MG PO TABS: 1.0000 | ORAL_TABLET | Freq: Two times a day (BID) | ORAL | 0 refills | Status: DC

## 2023-10-14 NOTE — Telephone Encounter (Signed)
 Scheduled w/Dr.Knapp for today.

## 2023-10-14 NOTE — Telephone Encounter (Signed)
 Copied from CRM [id number]. Topic: Clinical - Medical Advice >> Oct 14, 2023  1:41 PM Carlatta H wrote: Reason for CRM: Patient went to [facility] to get chest xray but the xray was down//She went to [facility] to have it done around 1pm//Please call patient  Called patient and let her know xray results, left messgage on her answering machine.

## 2023-10-14 NOTE — Telephone Encounter (Signed)
 FYI Only or Action Required?: Action required by provider: request for appointment.  Patient was last seen in primary care on 10/05/2023 by Joyce Norleen BROCKS, MD.  Called Nurse Triage reporting Cough.  Symptoms began several weeks ago.  Interventions attempted: cough medicine, allergy med, nose spray.  Symptoms are: unchanged.  Triage Disposition: See Physician Within 24 Hours  Patient/caregiver understands and will follow disposition?: Yes, will follow disposition  Please return call to home phone number and leave message.   Copied from CRM [id number]. Topic: Clinical - Red Word Triage >> Oct 14, 2023  7:52 AM Berwyn MATSU wrote: Red Word that prompted transfer to Nurse Triage: pain on side and not feeling better still has cough and fatigue. Reason for Disposition  SEVERE coughing spells (e.g., whooping sound after coughing, vomiting after coughing)  Answer Assessment - Initial Assessment Questions 1. ONSET: When did the cough begin?      About 2 weeks 2. SEVERITY: How bad is the cough today?      States cough is moderate 3. SPUTUM: Describe the color of your sputum (e.g., none, dry cough; clear, white, yellow, green)     yellow 4. HEMOPTYSIS: Are you coughing up any blood? If Yes, ask: How much? (e.g., flecks, streaks, tablespoons, etc.)     denies 5. DIFFICULTY BREATHING: Are you having difficulty breathing? If Yes, ask: How bad is it? (e.g., mild, moderate, severe)      denies 6. FEVER: Do you have a fever? If Yes, ask: What is your temperature, how was it measured, and when did it start?     denies 10. OTHER SYMPTOMS: Do you have any other symptoms? (e.g., runny nose, wheezing, chest pain)       Fatigue, runny nose,   Was seen recently at clinic, was given cough medicine, nose spray and allergy medicine.  Protocols used: Cough - Acute Productive-A-AH

## 2023-10-14 NOTE — Progress Notes (Unsigned)
 Chief Complaint  Patient presents with   Cough    Saw J[facility] on 7/8, still coughing and nose is still running. Has been having pain on her left side a few days ago that is now in her lower mid back.    She saw Dr. Joyce on 7/8 for a cough that started on 7/6. There was no fever or shortness of breath. She was prescribed tessalon  perles. She was told to stop flonase  and zyrtec, and switch to claritin or allegra, and rhinocort, as well as continue the ipratropium spray she already had.  Today she states switched to rhinocort and Claritin-D. She is taking benzonatate  BID, unsure if it helps, since she is still coughing. She woke up coughing at 6:30 this morning. She reports not getting better. Phlegm is now yellowish, throughout the day. Denies shortness of breath.  She feels a little warm in her face, hasn't checked her temperature. She denies chills.  She had a pain that started at her L flank, which is now located in the left lower back (flank pain resolved). She is worried something is wrong with her kidney, but thinks it could also be from exercise class. Denies dysuria, hematuria or other urinary complaints.   Brought in [facility] report.  BP was a little high that day (144). Only recommendation was for RSV vaccine.  PMH, PSH, SH reviewed  Outpatient Encounter Medications as of 10/14/2023  Medication Sig Note   atorvastatin  (LIPITOR) 20 MG tablet Take 20 mg by mouth daily.    benzonatate  (TESSALON ) 200 MG capsule Take 1 capsule (200 mg total) by mouth 2 (two) times daily as needed for cough. 10/14/2023: Taking BID   Biotin 5000 MCG TABS Take 5,000 mcg by mouth in the morning.    budesonide (RHINOCORT AQUA) 32 MCG/ACT nasal spray Place 2 sprays into both nostrils daily.    Calcium  Carbonate-Vitamin D  (CALTRATE 600+D PO) Take 1 tablet by mouth daily.    ipratropium (ATROVENT ) 0.06 % nasal spray Use 2 sprays in each nostril 3 to 4 times daily for runny nose 10/14/2023: 2-3 times a day    loratadine (CLARITIN) 10 MG tablet Take 10 mg by mouth daily.    Magnesium  250 MG TABS Take 1 tablet by mouth daily.    metoprolol  succinate (TOPROL  XL) 25 MG 24 hr tablet Take 1 tablet (25 mg total) by mouth daily.    Multiple Vitamin (MULTIVITAMIN WITH MINERALS) TABS tablet Take 1 tablet by mouth daily with breakfast. CENTRUM MULTIVITAMIN    PREVIDENT 5000 BOOSTER PLUS 1.1 % PSTE Place 1 application  onto teeth every 3 (three) days. 08/24/2023: Uses every 4th day    Probiotic Product (PROBIOTIC PO) Take 1 capsule by mouth daily.    Wheat Dextrin (BENEFIBER PO) Take 8-10 g by mouth See admin instructions. Mix 8-10 grams (2 teaspoonsful) of powder into applesauce or the suggested amount of warm water  and consume 2 times a day 08/24/2023: BID   ZINC-VITAMIN C PO Take 1 tablet by mouth daily.    No facility-administered encounter medications on file as of 10/14/2023.   Patient states she is actually taking claritin D, not plain claritin  No Known Allergies  ROS: no chills, no n/v/d. Cough per HPI. No CP or SOB. No rashes, bleeding, bruising. No edema. L back pain per HPI   PHYSICAL EXAM:  BP 128/78   Pulse 88   Temp 99.6 F (37.6 C) (Tympanic)   Ht 4' 10 (1.473 m)   Wt 110 lb  3.2 oz (50 kg)   BMI 23.03 kg/m   Wt Readings from Last 3 Encounters:  10/14/23 110 lb 3.2 oz (50 kg)  08/24/23 111 lb 3.2 oz (50.4 kg)  03/19/23 113 lb (51.3 kg)   Well-appearing pleasant female in no distress. No coughing during visit. Speaking comfortably. HEENT: conjunctiva and sclera are clear, EOMI.  R TM is partially occluded d/t cerumen.  Nonobstructive on the right, normal TM Nasal mucosa is mildly edematous, with yellow mucus in bilateral nares (looser on the left, crusted on the right) Sinuses are nontender. OP is clear Neck: No lymphadenopathy or mass Heart: regular rate and rhythm Lungs: clear, except crackles at L base   Urine:  SG 1.005, negative nitrite and  leuks  ASSESSMENT/PLAN:  Productive cough - given crackles L base, and recent L flank pain, CXR to r/o pneumonia. Appears to likely have sinus infection. Await results for ABX - Plan: DG Chest 2 View  Chest rales - L base. Given productive cough, will check CXR to r/o pneumonia, in order to determine best ABX choice - Plan: DG Chest 2 View  Side pain - urine normal, pt reassured. Suspect muscular  Chronic midline low back pain, unspecified whether sciatica present - pain more left sided, not midline, muscular, pain with movements.  No appreciable spasm - Plan: POCT Urinalysis DIP (Proadvantage Device)  Acute non-recurrent sinusitis, unspecified location - CXR came back normal. Treat with Augmentin  for acute sinusitis - Plan: amoxicillin -clavulanate (AUGMENTIN ) 875-125 MG tablet  Vaccine counseling - discussed RSV vaccine recs (if not already gotten, can check with pharmacy in [facility]), as well as rec for flu and COVID vaccine in September   CXR cam back normal, chronic scarring at bilateral bases. Pt advised no pneumonia, and augmentin  sent in to treat sinus infection.  She was advised to start Mucinex DM, and to do sinus rinses Advised to stop claritin-D, and switch to plain loratidine.  Reviewed [facility] report, and discussed recs for RSV in the Fall, from pharmacy. If she thinks she may have had this in [facility], she needs to check with her pharmacy in [facility]

## 2023-10-14 NOTE — Patient Instructions (Addendum)
 Please get the RSV vaccine from the pharmacy (if you have never had one) in the Fall.  I would prefer that you use a PLAIN claritin (loratidine).  I do not recommend taking the D version, which has pseudoephedrine, which can raise blood pressure and cause palpitations, insomnia, other side effects.  Go to [facility] at [facility] now to get a chest x-xray. We are going to treat you with an antibiotic. The x-ray will help me decide an antibiotic choice--whether there is pneumonia or this is just a sinus infection. We will send in the antibiotic once we get these results.  Take Mucinex DM to help thin out the mucus, and help with the cough. If you still need to take the benzonatate , you may use them together.  Don't take it if your cough is doing better on the mucinex DM alone.  Stay well hydrated. Consider doing sinus rinses (neti-pot or [facility] sinus rinse kit)--this will help flush out the mucus in your nose and sinuses, cutting back what drains down your throat.

## 2023-10-15 ENCOUNTER — Ambulatory Visit: Payer: Self-pay

## 2023-10-15 ENCOUNTER — Encounter: Payer: Self-pay | Admitting: Family Medicine

## 2023-10-15 NOTE — Telephone Encounter (Signed)
 Tried to call patient again but had to leave message.  Left detailed message of Dr. Randol recommendations on medication and what to do.

## 2023-10-15 NOTE — Telephone Encounter (Signed)
 FYI Only or Action Required?: Action required by provider: clinical question for provider and update on patient condition.  Patient was last seen in primary care on 10/14/2023 by Randol Dawes, MD.  Called Nurse Triage reporting Medication question .  Symptoms began several days ago.  Interventions attempted: OTC medications: Mucinex, loratadine, Prescription medications: tessalon , and Rest, hydration, or home remedies.  Symptoms are: gradually improving.  Triage Disposition: Call PCP When Office is Open  Patient/caregiver understands and will follow disposition?: No, wishes to speak with PCP   Copied from CRM [id number]. Topic: Clinical - Medication Question >> Oct 15, 2023  8:48 AM Alexis Hines wrote: Reason for CRM: Patient is calling to ask how to take the benzonatate  (TESSALON ) 200 MG capsule [[id number]] advised that she could take up to three. Patient is calling to confirm. Please advise Reason for Disposition  [1] Caller has NON-URGENT medicine question about med that PCP prescribed AND [2] triager unable to answer question  Answer Assessment - Initial Assessment Questions 1. NAME of MEDICINE: What medicine(s) are you calling about?     Benzonate 2. QUESTION: What is your question? (e.g., double dose of medicine, side effect)     How many  3. PRESCRIBER: Who prescribed the medicine? Reason: if prescribed by specialist, call should be referred to that group.     Dr. Randol 4. SYMPTOMS: Do you have any symptoms? If Yes, ask: What symptoms are you having?  How bad are the symptoms (e.g., mild, moderate, severe)     Cough-improving   Additional notes:  1) Alexis Hines calling to ask if ok to take 3 doses of Tessalon , the bottle says two times per day, she feels Dr. Randol told her she could take this three times per day. OV note reviewed. Please follow up call to clarify.   2) Leaving for exercise class now, would like person who is follow up calling to leave detailed message on her  voice mail if she can take 3 doses of Tessalon .  Protocols used: Medication Question Call-A-AH

## 2023-10-15 NOTE — Telephone Encounter (Signed)
 Left message for pt to call back to go over instructions about medication

## 2023-10-18 ENCOUNTER — Ambulatory Visit: Payer: Self-pay

## 2023-10-18 NOTE — Telephone Encounter (Signed)
 FYI Only or Action Required?: FYI only for provider.  Patient was last seen in primary care on 10/14/2023 by Randol Dawes, MD.  Called Nurse Triage reporting Diarrhea.  Symptoms began yesterday.  Interventions attempted: Rest, hydration, or home remedies.  Symptoms are: stable.  Triage Disposition: Home Care  Patient/caregiver understands and will follow disposition?: Yes    Copied from CRM [id number]. Topic: Clinical - Red Word Triage >> Oct 18, 2023  8:34 AM Delon HERO wrote: Red Word that prompted transfer to Nurse Triage: Patient is calling to rfollow up from office visit 10/05/23 acute cough reporting  loose stool yesterday. Still reporting congestion and cough.   Patient reporting she has excerise class today? Patient asking should she go? Or should she schedule an appointment with Dr. Randol.   Patient reporting if she continue with the benzonatate  (TESSALON ) 200 MG capsule [[id number]]? She is wondering how she should take with mussienex Reason for Disposition  [1] MILD diarrhea AND [2] taking antibiotics  Answer Assessment - Initial Assessment Questions Patient is on abx.  1. DIARRHEA SEVERITY: How bad is the diarrhea? How many more stools have you had in the past 24 hours than normal?      4 bowel movements yesterday - none today 2. ONSET: When did the diarrhea begin?      Diarrhea started yesterday 3. STOOL DESCRIPTION:  How loose or watery is the diarrhea? What is the stool color? Is there any blood or mucous in the stool?     Patient states the stool was loose and green, but no bowel movements today 4. VOMITING: Are you also vomiting? If Yes, ask: How many times in the past 24 hours?      No 5. ABDOMEN PAIN: Are you having any abdomen pain? If Yes, ask: What does it feel like? (e.g., crampy, dull, intermittent, constant)      Patient states she had stomach pain during the night but it is gone 6. ABDOMEN PAIN SEVERITY: If present, ask: How bad is the  pain?  (e.g., Scale 1-10; mild, moderate, or severe)     No stomach pain now 7. ORAL INTAKE: If vomiting, Have you been able to drink liquids? How much liquids have you had in the past 24 hours?     Patient states she is still drinking fluids  8. HYDRATION: Any signs of dehydration? (e.g., dry mouth [not just dry lips], too weak to stand, dizziness, new weight loss) When did you last urinate?     Patient is having weakness 10. ANTIBIOTIC USE: Are you taking antibiotics now or have you taken antibiotics in the past 2 months?       Yes - currently on Augmentin  11. OTHER SYMPTOMS: Do you have any other symptoms? (e.g., fever, blood in stool)       Weakness  Protocols used: Diarrhea-A-AH

## 2023-10-18 NOTE — Telephone Encounter (Signed)
 Called patient and left message.

## 2023-10-20 ENCOUNTER — Telehealth: Payer: Self-pay | Admitting: *Deleted

## 2023-10-20 NOTE — Telephone Encounter (Signed)
 Copied from CRM #[id number]. Topic: Clinical - Medical Advice >> Oct 20, 2023 12:57 PM Fonda T wrote: Reason for CRM: Received call from patient, states per provider, requested patient to follow up call from last visit on 10/14/23 for cough and runny nose.   Patient reports cough seems to be better, still feels slightly weak, and also having loose stool. She is still having runny nose. Patient states she is still taking antibiotic, nasal rinses/spray as prescribed.  Offered patient to speak to triage regarding symptoms, patient declined triage. Per patient, wanted provider to know as requested.  Patient requesting a follow up call at [phone].  Forwarding to Dr. Randol.

## 2023-10-20 NOTE — Telephone Encounter (Signed)
 Thank pt for the update. The loose stools are likely from the antibiotic. She can use a probiotic if she isn't already doing so (I believe we had mentioned that). She has a chronic runny nose, so I don't really expect that to improve.  She shouldn't be having any discolored mucus or phlegm at this point. Glad the cough is improving.

## 2023-10-20 NOTE — Telephone Encounter (Signed)
 Left message for patient

## 2023-10-21 ENCOUNTER — Encounter: Payer: Self-pay | Admitting: Family Medicine

## 2023-10-21 ENCOUNTER — Ambulatory Visit (INDEPENDENT_AMBULATORY_CARE_PROVIDER_SITE_OTHER): Admitting: Family Medicine

## 2023-10-21 ENCOUNTER — Telehealth: Payer: Self-pay | Admitting: *Deleted

## 2023-10-21 ENCOUNTER — Ambulatory Visit: Payer: Self-pay

## 2023-10-21 ENCOUNTER — Ambulatory Visit: Payer: Self-pay | Admitting: Family Medicine

## 2023-10-21 VITALS — BP 114/60 | HR 72 | Temp 99.6°F | Ht <= 58 in | Wt 107.0 lb

## 2023-10-21 DIAGNOSIS — R509 Fever, unspecified: Secondary | ICD-10-CM | POA: Diagnosis not present

## 2023-10-21 DIAGNOSIS — R197 Diarrhea, unspecified: Secondary | ICD-10-CM | POA: Diagnosis not present

## 2023-10-21 DIAGNOSIS — I499 Cardiac arrhythmia, unspecified: Secondary | ICD-10-CM | POA: Diagnosis not present

## 2023-10-21 DIAGNOSIS — J019 Acute sinusitis, unspecified: Secondary | ICD-10-CM | POA: Diagnosis not present

## 2023-10-21 LAB — COMPREHENSIVE METABOLIC PANEL WITH GFR
ALT: 15 IU/L (ref 0–35)
AST: 24 IU/L (ref 15–50)
Albumin: 4 g/dL (ref 3.7–4.7)
Alkaline Phosphatase: 110 IU/L (ref 44–121)
BUN/Creatinine Ratio: 16 (ref 12–28)
BUN: 13 mg/dL (ref 8–27)
Bilirubin Total: 0.9 mg/dL (ref 0.0–1.2)
CO2: 26 mmol/L (ref 20–29)
Calcium: 9.3 mg/dL (ref 8.7–10.3)
Chloride: 103 mmol/L (ref 96–106)
Creatinine, Ser: 0.8 mg/dL (ref 0.57–1.00)
Globulin, Total: 2.2 g/dL (ref 1.5–4.5)
Glucose: 88 mg/dL (ref 70–99)
Potassium: 4 mmol/L (ref 3.5–5.2)
Sodium: 140 mmol/L (ref 134–144)
Total Protein: 6.2 g/dL (ref 6.0–8.5)
eGFR: 71 mL/min/1.73 (ref >59)

## 2023-10-21 LAB — CBC WITH DIFFERENTIAL/PLATELET
Basophils Absolute: 0 x10E3/uL (ref 0.0–0.2)
Basos: 0 %
EOS (ABSOLUTE): 0.2 x10E3/uL (ref 0.0–0.4)
Eos: 2 %
Hematocrit: 44.9 % (ref 34.0–46.6)
Hemoglobin: 14.6 g/dL (ref 11.1–15.9)
Lymphocytes Absolute: 1.1 x10E3/uL (ref 0.7–3.1)
Lymphs: 10 %
MCH: 29.1 pg (ref 26.6–33.0)
MCHC: 32.5 g/dL (ref 31.5–35.7)
MCV: 90 fL (ref 79–97)
Monocytes Absolute: 1.1 x10E3/uL — ABNORMAL HIGH (ref 0.1–0.9)
Monocytes: 10 %
Neutrophils Absolute: 8.7 x10E3/uL — ABNORMAL HIGH (ref 1.4–7.0)
Neutrophils: 78 %
Platelets: 271 x10E3/uL (ref 150–450)
RBC: 5.01 x10E6/uL (ref 3.77–5.28)
RDW: 13.6 % (ref 11.7–15.4)
WBC: 11.1 x10E3/uL — ABNORMAL HIGH (ref 3.4–10.8)

## 2023-10-21 NOTE — Telephone Encounter (Signed)
 Copied from CRM [id number]. Topic: Clinical - Lab/Test Results >> Oct 21, 2023  3:33 PM Carlatta H wrote: Reason for CRM: Please call patient about lab results//  Called patient and left message.

## 2023-10-21 NOTE — Progress Notes (Signed)
 Chief Complaint  Patient presents with   Diarrhea    Patient is still having diarrhea. Had 100.8 temp last night. JCL told her to take 2 tylenol  last night-she took 2 advil  instead and fever came down. Did not take antibiotic today. Is taking probiotic. In one of her stools she had some blood, she thinks it may be from a hemorrhoid-just one time. Had chills at one point last night and her feet were cold. Thinks she could be getting CDiff.     Seen 1 week ago with cough, purulent mucus.  CXR was normal, started on augmentin  for sinus infection. She developed diarrhea x 2-3 days. It has been soft. Sunday--4 BM's, only 1 was loose/green, others were soft. Monday (3 days ago) she had 1 bowel movement. Tuesday--only had 1 bowel movement. Yesterday she had 8 episodes of very soft stool. Yesterday at 10pm noted slight BRB on the toilet paper, no further bleeding noted since then. This morning she woke up with diarrhea, and went had frequent water  diarrhea, about 6 episodes this morning.  Last night she had T 100.8. She called the office, spoke to Dr. Joyce on call.  Was told to take tylenol . She states tylenol  does nothing for her, so she took 2 advil  instead.  She didn't take with food.  Helped with her fever. She denies any significant abdominal pain, cramping, bloating.  She continues on the antibiotic. Asking if she needs to continue it. She had a slight cough last night, but pretty much cough has resolved otherwise.  She continue to have runny nose. She denies any discolored nasal drainage, but notes that she is still getting yellow mucus when doing sinus rinses. She finds the sinus rinses helpful. Needs to use ipratroprium during the day, as the rhinocort doesn't seem to help much.   PMH, PSH, SH reviewed  Outpatient Encounter Medications as of 10/21/2023  Medication Sig Note   atorvastatin  (LIPITOR) 20 MG tablet Take 20 mg by mouth daily.    Biotin 5000 MCG TABS Take 5,000 mcg by  mouth in the morning.    budesonide (RHINOCORT AQUA) 32 MCG/ACT nasal spray Place 2 sprays into both nostrils daily.    Calcium  Carbonate-Vitamin D  (CALTRATE 600+D PO) Take 1 tablet by mouth daily.    ibuprofen  (ADVIL ) 200 MG tablet Take 400 mg by mouth every 6 (six) hours as needed. 10/21/2023: Last dose 400mg  last night   ipratropium (ATROVENT ) 0.06 % nasal spray Use 2 sprays in each nostril 3 to 4 times daily for runny nose 10/14/2023: 2-3 times a day   loratadine (CLARITIN) 10 MG tablet Take 10 mg by mouth daily.    Magnesium  250 MG TABS Take 1 tablet by mouth daily.    metoprolol  succinate (TOPROL  XL) 25 MG 24 hr tablet Take 1 tablet (25 mg total) by mouth daily.    PREVIDENT 5000 BOOSTER PLUS 1.1 % PSTE Place 1 application  onto teeth every 3 (three) days. 08/24/2023: Uses every 4th day    Probiotic Product (PROBIOTIC PO) Take 1 capsule by mouth daily.    Wheat Dextrin (BENEFIBER PO) Take 8-10 g by mouth See admin instructions. Mix 8-10 grams (2 teaspoonsful) of powder into applesauce or the suggested amount of warm water  and consume 2 times a day 08/24/2023: BID   ZINC-VITAMIN C PO Take 1 tablet by mouth daily.    amoxicillin -clavulanate (AUGMENTIN ) 875-125 MG tablet Take 1 tablet by mouth 2 (two) times daily. (Patient not taking: Reported on 10/21/2023)  benzonatate  (TESSALON ) 200 MG capsule Take 1 capsule (200 mg total) by mouth 2 (two) times daily as needed for cough. (Patient not taking: Reported on 10/21/2023) 10/14/2023: Taking BID   [DISCONTINUED] Multiple Vitamin (MULTIVITAMIN WITH MINERALS) TABS tablet Take 1 tablet by mouth daily with breakfast. CENTRUM MULTIVITAMIN    No facility-administered encounter medications on file as of 10/21/2023.   No Known Allergies   ROS: low grade fever and diarrhea per HPI. Runny nose and purulent sinus rinses, per HPI. No headaches, dizziness. No chest pain. Normal appetite. No nausea or vomiting Weight loss noted. No urinary complaints,  bleeding, bruising or rash.   PHYSICAL EXAM:  BP 114/60   Pulse 72   Temp 99.6 F (37.6 C) (Tympanic)   Ht 4' 10 (1.473 m)   Wt 107 lb (48.5 kg)   BMI 22.36 kg/m   Wt Readings from Last 3 Encounters:  10/21/23 107 lb (48.5 kg)  10/14/23 110 lb 3.2 oz (50 kg)  08/24/23 111 lb 3.2 oz (50.4 kg)   Elderly female in no acute distress HEENT: conjunctiva and sclera are clear, EOMI. R TM not visualized secondary to cerumen, normal TM and EAC on the left. Nose--mod inflammation on left, slightly pink, no purulence Sinuses nontender OP moist mucus membranes Neck: no lymphadenopathy or mass Heart: irregularly irregular Lungs: clear bilaterally, trace crackle at base. No wheezes Abdomen: normal bowel sounds, soft, nontender Extremities: no edema   EKG--1st degree AVB with some PAC's.  No ischemia or acute changes. Similar to prior EKG (Read by computer as NSR with marked sinus arrhythmia).  Rate 89   ASSESSMENT/PLAN:   Diarrhea, unspecified type - likely due to antibiotic. Ddx includes C-diff, but with benign abdominal exam/history, suspect more SE. Sparing use of imodium  if needed, BRAT diet - Plan: Comprehensive metabolic panel with GFR, CBC with Differential/Platelet  Fever, unspecified fever cause - low grade. Prefer tylenol  over ibuprofen  to control. f/u if fever persists/worsens, especially if any abdominal pain, vomiting, blood in stool - Plan: CBC with Differential/Platelet  Acute non-recurrent sinusitis, unspecified location - based on exam and ongoing purulent drainage after sinus rinse, needs ongoing ABX (3 add'l days, or until clear). If can't tolerate, can change ABX  Irregular heartbeat - concerning for afib on exam; reassurred by EKG, unchanged from prior - Plan: EKG 12-Lead  Will check labs given increasing severity of diarrhea and fever.   Based on today's exam, I do think that you need to complete the next 3 days of the antibiotic. Continue the sinus rinses.  Let us  know if the yellow discoloration to the mucus doesn't clear.  We may need to change the antibiotic (especially if you can't stay on it until it becomes clear).  Try and use tylenol  preferentially for any fever or pain. If you must use advil , be sure to take it with food, not on an empty stomach.  Continue the daily rhinocort (budesonide). You may continue to use the ipratropium as needed (along with the rhinocort) if needed for runny nose.  We discussed using imodium  if you have ongoing watery diarrhea--this may stop the diarrhea and prevent you from getting dehydrated.  Only use it for frequent loose stools.  We discussed that it is safe to use imodium  when you are on an antibiotic that can cause diarrhea. We do NOT like it used if you have ongoing diarrhea, especially with any abdominal pain, cramping, bloody stools.  If you take imodium  when you could possibly have a C.diff  infection, that can make things worse.   Stay well hydrated--continue to drink plenty of water --look at the color of the urine.  The darker yellow the urine is, the more fluids you need to drink (can be gatorade or juice, doesn't have to be plain water ). You should back down on the benefiber right now.  BRAT diet--bananas, rice, applesauce and toast. Avoid greasy/fried foods, and avoid all dairy for about a week. Don't put milk in your coffee right now (you can use a nondairy creamy, vs almond milk, soy milk, lactaid milk is fine). Continue to take the probiotic.

## 2023-10-21 NOTE — Patient Instructions (Signed)
  Based on today's exam, I do think that you need to complete the next 3 days of the antibiotic. Continue the sinus rinses. Let us  know if the yellow discoloration to the mucus doesn't clear.  We may need to change the antibiotic (especially if you can't stay on it until it becomes clear).  Try and use tylenol  preferentially for any fever or pain. If you must use advil , be sure to take it with food, not on an empty stomach.  Continue the daily rhinocort (budesonide). You may continue to use the ipratropium as needed (along with the rhinocort) if needed for runny nose.  We discussed using imodium  if you have ongoing watery diarrhea--this may stop the diarrhea and prevent you from getting dehydrated.  Only use it for frequent loose stools.  We discussed that it is safe to use imodium  when you are on an antibiotic that can cause diarrhea. We do NOT like it used if you have ongoing diarrhea, especially with any abdominal pain, cramping, bloody stools.  If you take imodium  when you could possibly have a C.diff infection, that can make things worse.   Stay well hydrated--continue to drink plenty of water --look at the color of the urine.  The darker yellow the urine is, the more fluids you need to drink (can be gatorade or juice, doesn't have to be plain water ). You should back down on the benefiber right now.  BRAT diet--bananas, rice, applesauce and toast. Avoid greasy/fried foods, and avoid all dairy for about a week. Don't put milk in your coffee right now (you can use a nondairy creamy, vs almond milk, soy milk, lactaid milk is fine). Continue to take the probiotic.

## 2023-10-21 NOTE — Telephone Encounter (Signed)
 FYI Only or Action Required?: FYI only for provider.  Patient was last seen in primary care on 10/14/2023 by Randol Dawes, MD.  Called Nurse Triage reporting Diarrhea.  Symptoms began 2 days ago.  Interventions attempted: Rest, hydration, or home remedies.  Symptoms are: unchanged.  Triage Disposition: See HCP Within 4 Hours (Or PCP Triage)  Patient/caregiver understands and will follow disposition?: Yes  Copied from CRM #[id number]. Topic: Clinical - Red Word Triage >> Oct 21, 2023  8:49 AM Marissa P wrote: Red Word that prompted transfer to Nurse Triage: Patient is having constant diarrhea and would like to be seen right away. Doctor Randol has an opening later this morning according to cal please get her in with that time. Reason for Disposition  [1] SEVERE diarrhea (e.g., 7 or more times / day more than normal) AND [2] age > 60 years  Answer Assessment - Initial Assessment Questions 1. DIARRHEA SEVERITY: How bad is the diarrhea? How many more stools have you had in the past 24 hours than normal?      Severe-patient typically goes once a day.  2. ONSET: When did the diarrhea begin?      2-3 days ago 3. STOOL DESCRIPTION:  How loose or watery is the diarrhea? What is the stool color? Is there any blood or mucous in the stool?     Watery this morning 4. VOMITING: Are you also vomiting? If Yes, ask: How many times in the past 24 hours?      no 5. ABDOMEN PAIN: Are you having any abdomen pain? If Yes, ask: What does it feel like? (e.g., crampy, dull, intermittent, constant)      no 6. ABDOMEN PAIN SEVERITY: If present, ask: How bad is the pain?  (e.g., Scale 1-10; mild, moderate, or severe)     no 7. ORAL INTAKE: If vomiting, Have you been able to drink liquids? How much liquids have you had in the past 24 hours?     Drinking well 8. HYDRATION: Any signs of dehydration? (e.g., dry mouth [not just dry lips], too weak to stand, dizziness, new weight loss) When  did you last urinate?     no 9. EXPOSURE: Have you traveled to a foreign country recently? Have you been exposed to anyone with diarrhea? Could you have eaten any food that was spoiled?     no 10. ANTIBIOTIC USE: Are you taking antibiotics now or have you taken antibiotics in the past 2 months?       yes 11. OTHER SYMPTOMS: Do you have any other symptoms? (e.g., fever, blood in stool)       Patient reports a temperature of 100.8.  Protocols used: [facility]

## 2023-10-22 ENCOUNTER — Telehealth: Payer: Self-pay | Admitting: Family Medicine

## 2023-10-22 ENCOUNTER — Telehealth: Payer: Self-pay

## 2023-10-22 DIAGNOSIS — J019 Acute sinusitis, unspecified: Secondary | ICD-10-CM

## 2023-10-22 MED ORDER — AZITHROMYCIN 250 MG PO TABS: ORAL_TABLET | ORAL | 0 refills | Status: AC

## 2023-10-22 NOTE — Telephone Encounter (Signed)
 Copied from CRM [id number]. Topic: Clinical - Medical Advice >> Oct 22, 2023  9:27 AM Wess RAMAN wrote: Reason for CRM:  Patient was put on a brat diet and would like to know what else she can have.  Tuna on a roll Melons, guava toast with jam on it Roll that is not toasted Mushroom sauce with spaghetti  Callback #: [phone]

## 2023-10-22 NOTE — Telephone Encounter (Signed)
 Please call patient. I will send in a different antibiotic for her. If still having diarrhea after changing the antibiotic then c diff testing is a reasonable option.   Diet choices are best to be easily digested foods, bland foods such as bananas, rice, applesauce, toast. OK to add low sodium broth, potato, and other bland foods.   Avoid dairy products, foods with lots of seasoning/spices, meat, acidic foods, or foods with lots of sugar.   If any food causes increase in stool, then hold that food for at least 48 hours.   She can take imodium  for diarrhea, if needed.

## 2023-10-22 NOTE — Telephone Encounter (Signed)
 Copied from CRM [id number]. Topic: Clinical - Medical Advice >> Oct 22, 2023  9:27 AM Wess RAMAN wrote: Reason for CRM:  Patient was put on a brat diet and would like to know what else she can have.  Tuna on a roll Melons, guava toast with jam on it Roll that is not toasted Mushroom sauce with spaghetti  Callback #: [phone] >> Oct 22, 2023 11:46 AM Gustabo D wrote: Patient wants to come in for a Cdisk test today and says she spoke with [place] about changing her antibiotic >> Oct 22, 2023 11:43 AM Gustabo D wrote: Patient is calling because she hasn't gotten a call back yet.

## 2023-10-22 NOTE — Telephone Encounter (Signed)
 Pt called and states she thinks she needs different antibiotic, states current med is causing diarrhea She wants to know about doing the C diff test and also has questions about the BRAT diet and what she can add to it    See previous messages

## 2023-10-25 ENCOUNTER — Ambulatory Visit (INDEPENDENT_AMBULATORY_CARE_PROVIDER_SITE_OTHER): Admitting: Family Medicine

## 2023-10-25 ENCOUNTER — Encounter: Payer: Self-pay | Admitting: Family Medicine

## 2023-10-25 VITALS — BP 128/68 | HR 84 | Temp 98.2°F | Ht <= 58 in | Wt 107.0 lb

## 2023-10-25 DIAGNOSIS — J019 Acute sinusitis, unspecified: Secondary | ICD-10-CM

## 2023-10-25 DIAGNOSIS — R197 Diarrhea, unspecified: Secondary | ICD-10-CM | POA: Diagnosis not present

## 2023-10-25 NOTE — Patient Instructions (Signed)
 Use the imodium  as directed on the package, if needed for diarrhea. Submit the stool sample if your diarrhea doesn't respond to the imodium , or if you have persistent diarrhea even after the antibiotic has been completed.

## 2023-10-25 NOTE — Progress Notes (Signed)
 Chief Complaint  Patient presents with   Diarrhea    Still having diarrhea, not getting better. Able to sleep-when she wakes up she has to rush to the toilet.    She was seen 7/17 and started on Augmentin  for a sinus infection. She returned 7/24 with complaints of diarrhea and fever. Exam still showed some residual inflammation in nares, advised to continue the Augmentin  for 3 days longer, if tolerated, or to contact us  for change in antibiotic if couldn't tolerate. Advised to use imodium  prn while on the antibiotic.  Antibiotic was changed from Augmentin  to azithromycin  on Friday 7/25. She hasn't had any further fever.  She no longer sees yellow with the sinus rinses. Saw slightly yellowish phlegm just this morning. It has been white when doing sinus rinses.  She has persistent diarrhea and urgency. She hasn't been able to go to exercise class or church, for fear of having an accident. This is very concerning to her, not being able to keep up with her usual activities.  She brings in her detailed notes. She had 6 episodes of watery diarrhea before 2 pm on Saturday, then improved. Sunday--stools were still watery, but very small amounts, frequent episodes. No diarrhea overnight, +urgency in the morning, with some leakage on the way to the bathroom (wearing depends).  Denies incontinence while sleeping.  Today--3 episodes of watery stool, BRB on the toilet paper (small amount) noted only one of the times.  She admits she only used imodium  one time, after her last visit here, thinks it helped for a few hours. She feels weak, a little scared.   PMH, PSH, SH reviewed  Outpatient Encounter Medications as of 10/25/2023  Medication Sig Note   atorvastatin  (LIPITOR) 20 MG tablet Take 20 mg by mouth daily.    azithromycin  (ZITHROMAX ) 250 MG tablet Take 2 tablets on day 1, then 1 tablet daily on days 2 through 5    Biotin 5000 MCG TABS Take 5,000 mcg by mouth in the morning.    budesonide  (RHINOCORT AQUA) 32 MCG/ACT nasal spray Place 2 sprays into both nostrils daily.    Calcium  Carbonate-Vitamin D  (CALTRATE 600+D PO) Take 1 tablet by mouth daily.    ipratropium (ATROVENT ) 0.06 % nasal spray Use 2 sprays in each nostril 3 to 4 times daily for runny nose 10/14/2023: 2-3 times a day   loratadine (CLARITIN) 10 MG tablet Take 10 mg by mouth daily.    Magnesium  250 MG TABS Take 1 tablet by mouth daily.    metoprolol  succinate (TOPROL  XL) 25 MG 24 hr tablet Take 1 tablet (25 mg total) by mouth daily.    PREVIDENT 5000 BOOSTER PLUS 1.1 % PSTE Place 1 application  onto teeth every 3 (three) days. 08/24/2023: Uses every 4th day    Probiotic Product (PROBIOTIC PO) Take 1 capsule by mouth daily.    ibuprofen  (ADVIL ) 200 MG tablet Take 400 mg by mouth every 6 (six) hours as needed. (Patient not taking: Reported on 10/25/2023)    Wheat Dextrin (BENEFIBER PO) Take 8-10 g by mouth See admin instructions. Mix 8-10 grams (2 teaspoonsful) of powder into applesauce or the suggested amount of warm water  and consume 2 times a day (Patient not taking: Reported on 10/25/2023) 08/24/2023: BID   ZINC-VITAMIN C PO Take 1 tablet by mouth daily. (Patient not taking: Reported on 10/25/2023)    [DISCONTINUED] benzonatate  (TESSALON ) 200 MG capsule Take 1 capsule (200 mg total) by mouth 2 (two) times daily as needed  for cough. (Patient not taking: Reported on 10/21/2023) 10/14/2023: Taking BID   No facility-administered encounter medications on file as of 10/25/2023.   No Known Allergies   ROS: No fever, chills.  Occ slight dizziness. No nausea or vomiting. No dysuria. Slight lower abdominal discomfort Occasional BRB on toilet paper, intermittently.  Denies pain with bowel movements.    PHYSICAL EXAM:  BP 128/68   Pulse 84   Temp 98.2 F (36.8 C) (Tympanic)   Ht 4' 10 (1.473 m)   Wt 107 lb (48.5 kg)   BMI 22.36 kg/m   Wt Readings from Last 3 Encounters:  10/25/23 107 lb (48.5 kg)  10/21/23 107 lb  (48.5 kg)  10/14/23 110 lb 3.2 oz (50 kg)   Pleasant, anxious female, in no distress. She is accompanied by her daughter today.  HEENT: conjunctiva and sclera are clear, EOMI. OP clear.   Nasal mucosa--mild-mod edematous on the left, but no longer inflamed. No purulence. Sinuses nontender Neck: no lymphadenopathy or mass Heart: regular rate, irregular rhythm, frequent ectopy.  Unchanged from visit last week (when EKG was done) Lungs: clear bilaterally Abdomen: normal bowel sounds. Nontender, no organomegaly or mass Extremities: no edema Psych: anxious, full range of affect. Neuro: alert and oriented, cranial nerves grossly intact. Normal gait   ASSESSMENT/PLAN:  Diarrhea, unspecified type - suspect related to ABX. Cont probiotic. use imodium  prn.  Stool studies if diarrhea persists or if fever, pain, blood - Plan: Cdiff NAA+O+P+Stool Culture  Acute non-recurrent sinusitis, unspecified location - resolving--no longer purulent, fever resolved.  Complete the last 2 days of azithromycin   Reviewed BRAT diet, use of imodium . Reminded to avoid dairy, and hold the benefiber while having such frequent bowel movements. Encouraged high fiber diet.  All questions (of pt and daughter) answered.

## 2023-10-27 ENCOUNTER — Telehealth: Payer: Self-pay | Admitting: *Deleted

## 2023-10-27 NOTE — Telephone Encounter (Signed)
 Copied from CRM [id number]. Topic: Clinical - Lab/Test Results >> Oct 27, 2023 10:11 AM Graeme ORN wrote: Reason for CRM: Patient called about having trouble with stool sample >> Oct 27, 2023  3:08 PM Fredrica W wrote: Patient called. Dropped off stool. Checking for status. Also states she needed to speak with [place] when she dropped it off. She was with a patient. She told them to have her call but has not received a call. Would like to speak with [place] about her condition if she could give her a callback. Thank You    Spoke with patient and answered all questions.

## 2023-10-28 ENCOUNTER — Ambulatory Visit: Payer: Self-pay | Admitting: Family Medicine

## 2023-11-01 LAB — CDIFF NAA+O+P+STOOL CULTURE
E coli, Shiga toxin Assay: NEGATIVE
Toxigenic C. Difficile by PCR: NEGATIVE

## 2023-11-04 ENCOUNTER — Telehealth: Payer: Self-pay | Admitting: *Deleted

## 2023-11-04 NOTE — Telephone Encounter (Signed)
 Copied from CRM #[id number]. Topic: General - Other >> Nov 04, 2023  3:38 PM Myrick T wrote: Reason for CRM: patient called wanting to let her provider know she is feeling much better and has been going to exercise class but still has the runny nose. The claritan is not better than the Zyrtec but she will use the ipratropium she was on and carry Kleenex. Patient says she is going out of town 8/22 - 9/9 but will be at the office this Monday for the blood test and the prevnar20 shot.  Forwarding to Dr. Randol

## 2023-11-08 ENCOUNTER — Other Ambulatory Visit

## 2023-11-08 ENCOUNTER — Telehealth: Payer: Self-pay | Admitting: Family Medicine

## 2023-11-08 DIAGNOSIS — E782 Mixed hyperlipidemia: Secondary | ICD-10-CM

## 2023-11-08 DIAGNOSIS — Z5181 Encounter for therapeutic drug level monitoring: Secondary | ICD-10-CM

## 2023-11-08 NOTE — Telephone Encounter (Signed)
 Patient here for labs and requesting prevnar-20. Reviewed note from 07/2023 where this was discussed. She had been asked to verify whether or not she had this vaccine elsewhere (ie in [place]  through her PCP there or pharmacy). She has not yet done so.  Reviewed our discussion and reviewed her immunizations.  She will check (plans to check with her insurance, also can check with her PCP, who likely can [place] 's immune registry). She can schedule a nurse visit for this once verified she hasn't gotten it already.  It is >5 years from prevnar-13 and >1 year from last pneumovax, so okay for this vaccine if not previously received. Also mentioned she has the option to get Capvaxive-21 from the pharmacy in place of prevnar-20.

## 2023-11-08 NOTE — Telephone Encounter (Signed)
 See message from me (part of this message) from earlier below. Prevnar-20 (or capvaxive from pharmacy if she prefers)

## 2023-11-08 NOTE — Telephone Encounter (Unsigned)
 Copied from CRM [id number]. Topic: Clinical - Medical Advice >> Nov 08, 2023  3:58 PM Fonda T wrote: Reason for CRM: Received call from patient, reports she needs a pneumococcal injection, but unsure what to schedule.  States her timeline of injections are as follows,  Received Pneumovax in [place]  in 2013, Received Prevnar 13, at [facility] in [place] in 2017 Received pneumovax injection on September 11, 2021  Patient requesting a return call to advise what type injection she should be getting next.  Can be reached at [phone] to discuss further.

## 2023-11-09 ENCOUNTER — Ambulatory Visit: Payer: Self-pay | Admitting: Family Medicine

## 2023-11-09 ENCOUNTER — Telehealth: Payer: Self-pay | Admitting: Family Medicine

## 2023-11-09 LAB — LIPID PANEL
Chol/HDL Ratio: 2.5 ratio (ref 0.0–4.4)
Cholesterol, Total: 160 mg/dL (ref 100–199)
HDL: 65 mg/dL (ref >39)
LDL Chol Calc (NIH): 79 mg/dL (ref 0–99)
Triglycerides: 87 mg/dL (ref 0–149)
VLDL Cholesterol Cal: 16 mg/dL (ref 5–40)

## 2023-11-09 NOTE — Telephone Encounter (Unsigned)
 Copied from CRM #[id number]. Topic: Appointments - Scheduling Inquiry for Clinic >> Nov 09, 2023  2:59 PM Donee H wrote: Reason for CRM: Patient called stating she spoke with a [place] regarding getting a Pneumococcal 20 vaccine. She would like to see if it's possible to come in to get it tomorrow morning around 9. Please follow up with patient regarding request. Callback number [phone]

## 2023-11-09 NOTE — Telephone Encounter (Signed)
 Left detailed message for pt that she can get the Prevnar 20 here or go to pharmacy to get it and then let us  know if she gets it at pharmacy so we can put in date

## 2023-11-10 ENCOUNTER — Other Ambulatory Visit

## 2023-11-10 DIAGNOSIS — Z23 Encounter for immunization: Secondary | ICD-10-CM | POA: Diagnosis not present

## 2023-11-11 ENCOUNTER — Telehealth: Payer: Self-pay

## 2023-11-11 NOTE — Telephone Encounter (Signed)
 Copied from CRM #[id number]. Topic: Clinical - Medical Advice >> Nov 11, 2023  2:09 PM Fonda T wrote: Reason for CRM: Reason for CRM: Received call from patient, states she is experiencing coughing, with sneezing, and is requesting medication be sent to pharmacy.   Offered patient to speak with Nurse Triage, patient declines, states she only wants to speak to provider or nurse in office.  Can be reached for follow up call at [phone]  If no answer, ok to leave a detailed voicemail.

## 2023-11-11 NOTE — Telephone Encounter (Signed)
 LVM  Patient received Pneumovax 20 yesterday. Will need to take OTC medications related to her current symptoms. If she has developed fever or other concerning symptoms will need OV to assess.

## 2023-11-11 NOTE — Telephone Encounter (Signed)
 Spoke with patient and advised her to try Delsym and Coricidin as needed to treat her current symptoms. She will try this. Also advised to contact office if getting worse or develops fever/add'l symptoms. She voiced understanding. Nothing further needed at this time.

## 2023-12-08 ENCOUNTER — Telehealth: Payer: Self-pay | Admitting: *Deleted

## 2023-12-08 NOTE — Telephone Encounter (Signed)
 Copied from CRM [id number]. Topic: General - Call Back - No Documentation >> Dec 08, 2023  2:50 PM Rachelle R wrote: Reason for CRM: Patient received a call from [place] in regards to the RSV Vaccine. Patient has questions and requested to speak directly to her.  Patient can be reached at [phone]  Called patient.

## 2023-12-08 NOTE — Telephone Encounter (Signed)
 Copied from CRM [id number]. Topic: Appointments - Scheduling Inquiry for Clinic >> Dec 08, 2023  2:16 PM Alexis Hines wrote: Reason for CRM: Patient states Dr Randol told her to get the flu shot immediately, but when scheduling there isnt any available dates until November.  Patient also would like a return phone call letting her know if she flu shots come from the pharmacy or is at the office. And does the patient need to go to the pharmacy to get the RSV shot?  Called patient back and left her a messahe

## 2023-12-13 ENCOUNTER — Telehealth: Payer: Self-pay | Admitting: *Deleted

## 2023-12-13 ENCOUNTER — Encounter: Payer: Self-pay | Admitting: *Deleted

## 2023-12-13 NOTE — Telephone Encounter (Signed)
 Copied from CRM #[id number]. Topic: Clinical - Medication Question >> Dec 13, 2023  4:43 PM Graeme ORN wrote: Reason for CRM: Patient called states she wanted to let someone know she received RSV vaccine. Thursday r Friday at [facility]. Request Rx for Covid be sent to [id number] - [place], [place] - [address] AT [facility] [address]. Thank You  Documented RSV in chart and called patient and left message re:rx no longer needed she can go to pharmacy.

## 2023-12-27 ENCOUNTER — Other Ambulatory Visit: Payer: Self-pay | Admitting: Cardiology

## 2024-01-05 ENCOUNTER — Other Ambulatory Visit

## 2024-01-05 DIAGNOSIS — Z23 Encounter for immunization: Secondary | ICD-10-CM

## 2024-01-12 ENCOUNTER — Other Ambulatory Visit: Payer: Self-pay | Admitting: Cardiology

## 2024-02-23 ENCOUNTER — Other Ambulatory Visit: Payer: Self-pay | Admitting: Family Medicine

## 2024-02-23 DIAGNOSIS — J310 Chronic rhinitis: Secondary | ICD-10-CM

## 2024-03-01 ENCOUNTER — Other Ambulatory Visit: Payer: Self-pay | Admitting: Cardiology
